# Patient Record
Sex: Female | Born: 1955 | Race: White | Hispanic: No | Marital: Married | Smoking: Former smoker
Health system: Southern US, Community
[De-identification: ages and names within clinical notes are randomized; demographics above are authoritative.]

## PROBLEM LIST (undated history)

## (undated) DIAGNOSIS — E039 Hypothyroidism, unspecified: Secondary | ICD-10-CM

## (undated) DIAGNOSIS — I8390 Asymptomatic varicose veins of unspecified lower extremity: Secondary | ICD-10-CM

## (undated) DIAGNOSIS — C50919 Malignant neoplasm of unspecified site of unspecified female breast: Secondary | ICD-10-CM

## (undated) DIAGNOSIS — M1990 Unspecified osteoarthritis, unspecified site: Secondary | ICD-10-CM

## (undated) DIAGNOSIS — D72819 Decreased white blood cell count, unspecified: Secondary | ICD-10-CM

## (undated) DIAGNOSIS — O24420 Gestational diabetes mellitus in childbirth, diet controlled: Secondary | ICD-10-CM

## (undated) DIAGNOSIS — Z9221 Personal history of antineoplastic chemotherapy: Secondary | ICD-10-CM

## (undated) DIAGNOSIS — D649 Anemia, unspecified: Secondary | ICD-10-CM

## (undated) DIAGNOSIS — M4120 Other idiopathic scoliosis, site unspecified: Secondary | ICD-10-CM

## (undated) DIAGNOSIS — Z923 Personal history of irradiation: Secondary | ICD-10-CM

## (undated) DIAGNOSIS — Z853 Personal history of malignant neoplasm of breast: Secondary | ICD-10-CM

## (undated) DIAGNOSIS — T8859XA Other complications of anesthesia, initial encounter: Secondary | ICD-10-CM

## (undated) HISTORY — PX: BREAST LUMPECTOMY: SHX2

## (undated) HISTORY — PX: TONSILLECTOMY: SHX5217

## (undated) HISTORY — DX: Gestational diabetes mellitus in childbirth, diet controlled: O24.420

## (undated) HISTORY — DX: Hypothyroidism, unspecified: E03.9

## (undated) HISTORY — PX: TOTAL HIP ARTHROPLASTY: SHX124

## (undated) HISTORY — DX: Asymptomatic varicose veins of unspecified lower extremity: I83.90

## (undated) HISTORY — DX: Anemia, unspecified: D64.9

## (undated) HISTORY — DX: Decreased white blood cell count, unspecified: D72.819

## (undated) HISTORY — DX: Personal history of malignant neoplasm of breast: Z85.3

## (undated) HISTORY — DX: Other idiopathic scoliosis, site unspecified: M41.20

## (undated) HISTORY — PX: BREAST BIOPSY: SHX20

## (undated) HISTORY — DX: Unspecified osteoarthritis, unspecified site: M19.90

---

## 1983-10-26 DIAGNOSIS — O24420 Gestational diabetes mellitus in childbirth, diet controlled: Secondary | ICD-10-CM

## 1983-10-26 HISTORY — DX: Gestational diabetes mellitus in childbirth, diet controlled: O24.420

## 1994-10-25 DIAGNOSIS — C50919 Malignant neoplasm of unspecified site of unspecified female breast: Secondary | ICD-10-CM

## 1994-10-25 HISTORY — DX: Malignant neoplasm of unspecified site of unspecified female breast: C50.919

## 1994-10-25 HISTORY — PX: OTHER SURGICAL HISTORY: SHX169

## 1997-12-30 ENCOUNTER — Ambulatory Visit (HOSPITAL_COMMUNITY): Admission: RE | Admit: 1997-12-30 | Discharge: 1997-12-30 | Payer: Self-pay | Admitting: Oncology

## 1998-01-03 ENCOUNTER — Ambulatory Visit (HOSPITAL_COMMUNITY): Admission: RE | Admit: 1998-01-03 | Discharge: 1998-01-03 | Payer: Self-pay | Admitting: Oncology

## 1998-04-14 ENCOUNTER — Other Ambulatory Visit: Admission: RE | Admit: 1998-04-14 | Discharge: 1998-04-14 | Payer: Self-pay | Admitting: *Deleted

## 1998-07-22 ENCOUNTER — Ambulatory Visit (HOSPITAL_COMMUNITY): Admission: RE | Admit: 1998-07-22 | Discharge: 1998-07-22 | Payer: Self-pay | Admitting: Oncology

## 1999-02-03 ENCOUNTER — Ambulatory Visit (HOSPITAL_COMMUNITY): Admission: RE | Admit: 1999-02-03 | Discharge: 1999-02-03 | Payer: Self-pay | Admitting: *Deleted

## 1999-02-03 ENCOUNTER — Encounter: Payer: Self-pay | Admitting: *Deleted

## 1999-04-15 ENCOUNTER — Other Ambulatory Visit: Admission: RE | Admit: 1999-04-15 | Discharge: 1999-04-15 | Payer: Self-pay | Admitting: *Deleted

## 1999-04-23 ENCOUNTER — Encounter: Payer: Self-pay | Admitting: Orthopedic Surgery

## 1999-04-23 ENCOUNTER — Ambulatory Visit (HOSPITAL_COMMUNITY): Admission: RE | Admit: 1999-04-23 | Discharge: 1999-04-23 | Payer: Self-pay | Admitting: Orthopedic Surgery

## 1999-05-11 ENCOUNTER — Ambulatory Visit (HOSPITAL_COMMUNITY): Admission: RE | Admit: 1999-05-11 | Discharge: 1999-05-11 | Payer: Self-pay | Admitting: *Deleted

## 1999-05-11 ENCOUNTER — Encounter (INDEPENDENT_AMBULATORY_CARE_PROVIDER_SITE_OTHER): Payer: Self-pay

## 2000-02-04 ENCOUNTER — Encounter: Admission: RE | Admit: 2000-02-04 | Discharge: 2000-02-04 | Payer: Self-pay | Admitting: Oncology

## 2000-02-04 ENCOUNTER — Encounter: Payer: Self-pay | Admitting: Oncology

## 2000-03-24 ENCOUNTER — Ambulatory Visit (HOSPITAL_COMMUNITY): Admission: RE | Admit: 2000-03-24 | Discharge: 2000-03-24 | Payer: Self-pay | Admitting: Orthopedic Surgery

## 2000-03-24 ENCOUNTER — Encounter: Payer: Self-pay | Admitting: Orthopedic Surgery

## 2000-03-29 ENCOUNTER — Encounter: Admission: RE | Admit: 2000-03-29 | Discharge: 2000-03-29 | Payer: Self-pay | Admitting: Oncology

## 2000-03-29 ENCOUNTER — Encounter: Payer: Self-pay | Admitting: Oncology

## 2000-04-12 ENCOUNTER — Other Ambulatory Visit: Admission: RE | Admit: 2000-04-12 | Discharge: 2000-04-12 | Payer: Self-pay | Admitting: *Deleted

## 2000-06-30 ENCOUNTER — Ambulatory Visit (HOSPITAL_COMMUNITY): Admission: RE | Admit: 2000-06-30 | Discharge: 2000-06-30 | Payer: Self-pay | Admitting: *Deleted

## 2000-06-30 ENCOUNTER — Encounter (INDEPENDENT_AMBULATORY_CARE_PROVIDER_SITE_OTHER): Payer: Self-pay

## 2000-09-08 ENCOUNTER — Encounter: Admission: RE | Admit: 2000-09-08 | Discharge: 2000-09-08 | Payer: Self-pay | Admitting: Oncology

## 2000-09-08 ENCOUNTER — Encounter: Payer: Self-pay | Admitting: Oncology

## 2001-02-15 ENCOUNTER — Encounter: Admission: RE | Admit: 2001-02-15 | Discharge: 2001-02-15 | Payer: Self-pay | Admitting: Oncology

## 2001-02-15 ENCOUNTER — Encounter: Payer: Self-pay | Admitting: Oncology

## 2001-04-24 ENCOUNTER — Other Ambulatory Visit: Admission: RE | Admit: 2001-04-24 | Discharge: 2001-04-24 | Payer: Self-pay | Admitting: *Deleted

## 2002-02-16 ENCOUNTER — Encounter: Payer: Self-pay | Admitting: Oncology

## 2002-02-16 ENCOUNTER — Encounter: Admission: RE | Admit: 2002-02-16 | Discharge: 2002-02-16 | Payer: Self-pay | Admitting: Oncology

## 2002-05-15 ENCOUNTER — Other Ambulatory Visit: Admission: RE | Admit: 2002-05-15 | Discharge: 2002-05-15 | Payer: Self-pay | Admitting: *Deleted

## 2002-07-30 ENCOUNTER — Encounter: Payer: Self-pay | Admitting: Oncology

## 2002-07-30 ENCOUNTER — Encounter: Admission: RE | Admit: 2002-07-30 | Discharge: 2002-07-30 | Payer: Self-pay | Admitting: Oncology

## 2003-02-18 ENCOUNTER — Encounter: Admission: RE | Admit: 2003-02-18 | Discharge: 2003-02-18 | Payer: Self-pay | Admitting: Oncology

## 2003-02-18 ENCOUNTER — Encounter: Payer: Self-pay | Admitting: Oncology

## 2003-06-18 ENCOUNTER — Other Ambulatory Visit: Admission: RE | Admit: 2003-06-18 | Discharge: 2003-06-18 | Payer: Self-pay | Admitting: *Deleted

## 2003-09-09 ENCOUNTER — Encounter: Admission: RE | Admit: 2003-09-09 | Discharge: 2003-09-09 | Payer: Self-pay | Admitting: *Deleted

## 2004-02-21 ENCOUNTER — Encounter: Admission: RE | Admit: 2004-02-21 | Discharge: 2004-02-21 | Payer: Self-pay | Admitting: *Deleted

## 2004-08-15 ENCOUNTER — Ambulatory Visit: Payer: Self-pay | Admitting: Oncology

## 2004-09-04 ENCOUNTER — Encounter: Payer: Self-pay | Admitting: Internal Medicine

## 2004-09-07 ENCOUNTER — Ambulatory Visit (HOSPITAL_COMMUNITY): Admission: RE | Admit: 2004-09-07 | Discharge: 2004-09-07 | Payer: Self-pay | Admitting: Oncology

## 2004-09-07 ENCOUNTER — Encounter: Admission: RE | Admit: 2004-09-07 | Discharge: 2004-09-07 | Payer: Self-pay | Admitting: Oncology

## 2005-01-06 ENCOUNTER — Encounter: Admission: RE | Admit: 2005-01-06 | Discharge: 2005-01-06 | Payer: Self-pay | Admitting: Family Medicine

## 2005-03-08 ENCOUNTER — Encounter: Admission: RE | Admit: 2005-03-08 | Discharge: 2005-03-08 | Payer: Self-pay | Admitting: *Deleted

## 2005-08-20 ENCOUNTER — Ambulatory Visit: Payer: Self-pay | Admitting: Oncology

## 2005-10-15 ENCOUNTER — Encounter: Admission: RE | Admit: 2005-10-15 | Discharge: 2005-10-15 | Payer: Self-pay | Admitting: Oncology

## 2006-02-23 ENCOUNTER — Encounter: Payer: Self-pay | Admitting: Oncology

## 2006-03-11 ENCOUNTER — Encounter: Admission: RE | Admit: 2006-03-11 | Discharge: 2006-03-11 | Payer: Self-pay | Admitting: Oncology

## 2006-08-12 ENCOUNTER — Ambulatory Visit: Payer: Self-pay | Admitting: Oncology

## 2006-08-16 LAB — CBC WITH DIFFERENTIAL/PLATELET
BASO%: 0.4 % (ref 0.0–2.0)
Basophils Absolute: 0 10*3/uL (ref 0.0–0.1)
EOS%: 1.2 % (ref 0.0–7.0)
Eosinophils Absolute: 0 10*3/uL (ref 0.0–0.5)
HCT: 39.2 % (ref 34.8–46.6)
HGB: 13.5 g/dL (ref 11.6–15.9)
LYMPH%: 31.4 % (ref 14.0–48.0)
MCH: 32.6 pg (ref 26.0–34.0)
MCHC: 34.6 g/dL (ref 32.0–36.0)
MCV: 94.4 fL (ref 81.0–101.0)
MONO#: 0.3 10*3/uL (ref 0.1–0.9)
MONO%: 6.7 % (ref 0.0–13.0)
NEUT#: 2.3 10*3/uL (ref 1.5–6.5)
NEUT%: 60.3 % (ref 39.6–76.8)
Platelets: 221 10*3/uL (ref 145–400)
RBC: 4.15 10*6/uL (ref 3.70–5.32)
RDW: 12.2 % (ref 11.3–14.5)
WBC: 3.8 10*3/uL — ABNORMAL LOW (ref 3.9–10.0)
lymph#: 1.2 10*3/uL (ref 0.9–3.3)

## 2006-09-09 ENCOUNTER — Encounter: Admission: RE | Admit: 2006-09-09 | Discharge: 2006-09-09 | Payer: Self-pay | Admitting: Oncology

## 2006-09-30 ENCOUNTER — Ambulatory Visit: Payer: Self-pay | Admitting: Oncology

## 2006-10-28 LAB — MORPHOLOGY: PLT EST: ADEQUATE

## 2006-10-28 LAB — CBC WITH DIFFERENTIAL/PLATELET
BASO%: 0.4 % (ref 0.0–2.0)
Basophils Absolute: 0 10*3/uL (ref 0.0–0.1)
EOS%: 1.9 % (ref 0.0–7.0)
Eosinophils Absolute: 0.1 10*3/uL (ref 0.0–0.5)
HCT: 36.1 % (ref 34.8–46.6)
HGB: 12.3 g/dL (ref 11.6–15.9)
LYMPH%: 32.5 % (ref 14.0–48.0)
MCH: 31.8 pg (ref 26.0–34.0)
MCHC: 34 g/dL (ref 32.0–36.0)
MCV: 93.4 fL (ref 81.0–101.0)
MONO#: 0.3 10*3/uL (ref 0.1–0.9)
MONO%: 8.2 % (ref 0.0–13.0)
NEUT#: 2 10*3/uL (ref 1.5–6.5)
NEUT%: 57 % (ref 39.6–76.8)
Platelets: 215 10*3/uL (ref 145–400)
RBC: 3.87 10*6/uL (ref 3.70–5.32)
RDW: 12.3 % (ref 11.3–14.5)
WBC: 3.6 10*3/uL — ABNORMAL LOW (ref 3.9–10.0)
lymph#: 1.2 10*3/uL (ref 0.9–3.3)

## 2006-10-28 LAB — CHCC SMEAR

## 2007-03-01 ENCOUNTER — Ambulatory Visit: Payer: Self-pay | Admitting: Oncology

## 2007-03-06 LAB — CBC WITH DIFFERENTIAL/PLATELET
BASO%: 0.4 % (ref 0.0–2.0)
Basophils Absolute: 0 10*3/uL (ref 0.0–0.1)
EOS%: 1.4 % (ref 0.0–7.0)
Eosinophils Absolute: 0 10*3/uL (ref 0.0–0.5)
HCT: 35.8 % (ref 34.8–46.6)
HGB: 12.7 g/dL (ref 11.6–15.9)
LYMPH%: 31.6 % (ref 14.0–48.0)
MCH: 33 pg (ref 26.0–34.0)
MCHC: 35.5 g/dL (ref 32.0–36.0)
MCV: 92.8 fL (ref 81.0–101.0)
MONO#: 0.2 10*3/uL (ref 0.1–0.9)
MONO%: 6.6 % (ref 0.0–13.0)
NEUT#: 2.2 10*3/uL (ref 1.5–6.5)
NEUT%: 60 % (ref 39.6–76.8)
Platelets: 193 10*3/uL (ref 145–400)
RBC: 3.86 10*6/uL (ref 3.70–5.32)
RDW: 13 % (ref 11.3–14.5)
WBC: 3.6 10*3/uL — ABNORMAL LOW (ref 3.9–10.0)
lymph#: 1.1 10*3/uL (ref 0.9–3.3)

## 2007-03-06 LAB — MORPHOLOGY
PLT EST: ADEQUATE
RBC Comments: NORMAL

## 2007-08-10 ENCOUNTER — Ambulatory Visit: Payer: Self-pay | Admitting: Oncology

## 2007-08-15 LAB — CBC WITH DIFFERENTIAL/PLATELET
BASO%: 0.3 % (ref 0.0–2.0)
Basophils Absolute: 0 10*3/uL (ref 0.0–0.1)
EOS%: 1 % (ref 0.0–7.0)
Eosinophils Absolute: 0 10*3/uL (ref 0.0–0.5)
HCT: 34.7 % — ABNORMAL LOW (ref 34.8–46.6)
HGB: 12.2 g/dL (ref 11.6–15.9)
LYMPH%: 25.6 % (ref 14.0–48.0)
MCH: 32.4 pg (ref 26.0–34.0)
MCHC: 35.2 g/dL (ref 32.0–36.0)
MCV: 92.2 fL (ref 81.0–101.0)
MONO#: 0.3 10*3/uL (ref 0.1–0.9)
MONO%: 6.4 % (ref 0.0–13.0)
NEUT#: 2.8 10*3/uL (ref 1.5–6.5)
NEUT%: 66.7 % (ref 39.6–76.8)
Platelets: 208 10*3/uL (ref 145–400)
RBC: 3.76 10*6/uL (ref 3.70–5.32)
RDW: 12.2 % (ref 11.3–14.5)
WBC: 4.2 10*3/uL (ref 3.9–10.0)
lymph#: 1.1 10*3/uL (ref 0.9–3.3)

## 2007-08-15 LAB — COMPREHENSIVE METABOLIC PANEL
ALT: 17 U/L (ref 0–35)
AST: 14 U/L (ref 0–37)
Albumin: 4.3 g/dL (ref 3.5–5.2)
Alkaline Phosphatase: 63 U/L (ref 39–117)
BUN: 16 mg/dL (ref 6–23)
CO2: 28 mEq/L (ref 19–32)
Calcium: 9.3 mg/dL (ref 8.4–10.5)
Chloride: 103 mEq/L (ref 96–112)
Creatinine, Ser: 1 mg/dL (ref 0.40–1.20)
Glucose, Bld: 101 mg/dL — ABNORMAL HIGH (ref 70–99)
Potassium: 3.7 mEq/L (ref 3.5–5.3)
Sodium: 139 mEq/L (ref 135–145)
Total Bilirubin: 0.3 mg/dL (ref 0.3–1.2)
Total Protein: 6.9 g/dL (ref 6.0–8.3)

## 2007-08-15 LAB — LACTATE DEHYDROGENASE: LDH: 161 U/L (ref 94–250)

## 2007-08-15 LAB — MORPHOLOGY
PLT EST: ADEQUATE
RBC Comments: NORMAL

## 2007-09-11 ENCOUNTER — Encounter: Admission: RE | Admit: 2007-09-11 | Discharge: 2007-09-11 | Payer: Self-pay | Admitting: Oncology

## 2008-06-10 ENCOUNTER — Encounter: Payer: Self-pay | Admitting: Internal Medicine

## 2008-06-11 ENCOUNTER — Encounter: Payer: Self-pay | Admitting: Internal Medicine

## 2008-09-05 ENCOUNTER — Ambulatory Visit: Payer: Self-pay | Admitting: Oncology

## 2008-09-09 ENCOUNTER — Encounter: Payer: Self-pay | Admitting: Internal Medicine

## 2008-09-09 LAB — COMPREHENSIVE METABOLIC PANEL
ALT: 16 U/L (ref 0–35)
AST: 15 U/L (ref 0–37)
Albumin: 4.3 g/dL (ref 3.5–5.2)
Alkaline Phosphatase: 58 U/L (ref 39–117)
BUN: 18 mg/dL (ref 6–23)
CO2: 27 mEq/L (ref 19–32)
Calcium: 9.1 mg/dL (ref 8.4–10.5)
Chloride: 101 mEq/L (ref 96–112)
Creatinine, Ser: 0.92 mg/dL (ref 0.40–1.20)
Glucose, Bld: 93 mg/dL (ref 70–99)
Potassium: 3.9 mEq/L (ref 3.5–5.3)
Sodium: 140 mEq/L (ref 135–145)
Total Bilirubin: 0.4 mg/dL (ref 0.3–1.2)
Total Protein: 7 g/dL (ref 6.0–8.3)

## 2008-09-09 LAB — CBC WITH DIFFERENTIAL/PLATELET
BASO%: 0.4 % (ref 0.0–2.0)
Basophils Absolute: 0 10*3/uL (ref 0.0–0.1)
EOS%: 1.4 % (ref 0.0–7.0)
Eosinophils Absolute: 0.1 10*3/uL (ref 0.0–0.5)
HCT: 36.9 % (ref 34.8–46.6)
HGB: 12.7 g/dL (ref 11.6–15.9)
LYMPH%: 29.1 % (ref 14.0–48.0)
MCH: 32.4 pg (ref 26.0–34.0)
MCHC: 34.5 g/dL (ref 32.0–36.0)
MCV: 93.9 fL (ref 81.0–101.0)
MONO#: 0.2 10*3/uL (ref 0.1–0.9)
MONO%: 6.7 % (ref 0.0–13.0)
NEUT#: 2.2 10*3/uL (ref 1.5–6.5)
NEUT%: 62.4 % (ref 39.6–76.8)
Platelets: 195 10*3/uL (ref 145–400)
RBC: 3.93 10*6/uL (ref 3.70–5.32)
RDW: 12.1 % (ref 11.3–14.5)
WBC: 3.5 10*3/uL — ABNORMAL LOW (ref 3.9–10.0)
lymph#: 1 10*3/uL (ref 0.9–3.3)

## 2008-09-09 LAB — LACTATE DEHYDROGENASE: LDH: 148 U/L (ref 94–250)

## 2008-09-18 ENCOUNTER — Encounter: Admission: RE | Admit: 2008-09-18 | Discharge: 2008-09-18 | Payer: Self-pay | Admitting: Oncology

## 2008-09-23 ENCOUNTER — Encounter: Admission: RE | Admit: 2008-09-23 | Discharge: 2008-09-23 | Payer: Self-pay | Admitting: Oncology

## 2008-09-24 ENCOUNTER — Encounter: Payer: Self-pay | Admitting: Internal Medicine

## 2008-10-03 LAB — CONVERTED CEMR LAB: Pap Smear: NORMAL

## 2009-03-17 ENCOUNTER — Ambulatory Visit: Payer: Self-pay | Admitting: Internal Medicine

## 2009-03-17 DIAGNOSIS — D649 Anemia, unspecified: Secondary | ICD-10-CM | POA: Insufficient documentation

## 2009-03-17 DIAGNOSIS — Z853 Personal history of malignant neoplasm of breast: Secondary | ICD-10-CM | POA: Insufficient documentation

## 2009-03-17 DIAGNOSIS — M1990 Unspecified osteoarthritis, unspecified site: Secondary | ICD-10-CM | POA: Insufficient documentation

## 2009-03-17 DIAGNOSIS — Z9189 Other specified personal risk factors, not elsewhere classified: Secondary | ICD-10-CM | POA: Insufficient documentation

## 2009-03-17 DIAGNOSIS — M129 Arthropathy, unspecified: Secondary | ICD-10-CM | POA: Insufficient documentation

## 2009-03-17 DIAGNOSIS — Z87898 Personal history of other specified conditions: Secondary | ICD-10-CM | POA: Insufficient documentation

## 2009-03-17 DIAGNOSIS — I8390 Asymptomatic varicose veins of unspecified lower extremity: Secondary | ICD-10-CM | POA: Insufficient documentation

## 2009-03-17 DIAGNOSIS — M4120 Other idiopathic scoliosis, site unspecified: Secondary | ICD-10-CM | POA: Insufficient documentation

## 2009-03-26 ENCOUNTER — Telehealth (INDEPENDENT_AMBULATORY_CARE_PROVIDER_SITE_OTHER): Payer: Self-pay | Admitting: *Deleted

## 2009-04-02 ENCOUNTER — Encounter: Payer: Self-pay | Admitting: Internal Medicine

## 2009-04-08 ENCOUNTER — Telehealth (INDEPENDENT_AMBULATORY_CARE_PROVIDER_SITE_OTHER): Payer: Self-pay | Admitting: Radiology

## 2009-04-22 ENCOUNTER — Telehealth (INDEPENDENT_AMBULATORY_CARE_PROVIDER_SITE_OTHER): Payer: Self-pay

## 2009-04-23 ENCOUNTER — Encounter: Payer: Self-pay | Admitting: Cardiology

## 2009-04-23 ENCOUNTER — Ambulatory Visit: Payer: Self-pay

## 2009-04-24 ENCOUNTER — Encounter (INDEPENDENT_AMBULATORY_CARE_PROVIDER_SITE_OTHER): Payer: Self-pay | Admitting: *Deleted

## 2009-08-25 LAB — HM MAMMOGRAPHY

## 2009-09-19 ENCOUNTER — Encounter: Admission: RE | Admit: 2009-09-19 | Discharge: 2009-09-19 | Payer: Self-pay | Admitting: Oncology

## 2009-10-03 ENCOUNTER — Ambulatory Visit: Payer: Self-pay | Admitting: Oncology

## 2009-10-07 ENCOUNTER — Encounter: Payer: Self-pay | Admitting: Internal Medicine

## 2009-10-07 LAB — COMPREHENSIVE METABOLIC PANEL
ALT: 24 U/L (ref 0–35)
AST: 18 U/L (ref 0–37)
Albumin: 4.4 g/dL (ref 3.5–5.2)
Alkaline Phosphatase: 53 U/L (ref 39–117)
BUN: 16 mg/dL (ref 6–23)
CO2: 26 mEq/L (ref 19–32)
Calcium: 9.8 mg/dL (ref 8.4–10.5)
Chloride: 102 mEq/L (ref 96–112)
Creatinine, Ser: 0.88 mg/dL (ref 0.40–1.20)
Glucose, Bld: 102 mg/dL — ABNORMAL HIGH (ref 70–99)
Potassium: 4.2 mEq/L (ref 3.5–5.3)
Sodium: 140 mEq/L (ref 135–145)
Total Bilirubin: 0.4 mg/dL (ref 0.3–1.2)
Total Protein: 7.2 g/dL (ref 6.0–8.3)

## 2009-10-07 LAB — CBC WITH DIFFERENTIAL/PLATELET
BASO%: 0.4 % (ref 0.0–2.0)
Basophils Absolute: 0 10*3/uL (ref 0.0–0.1)
EOS%: 1.1 % (ref 0.0–7.0)
Eosinophils Absolute: 0 10*3/uL (ref 0.0–0.5)
HCT: 38.4 % (ref 34.8–46.6)
HGB: 13.3 g/dL (ref 11.6–15.9)
LYMPH%: 29.4 % (ref 14.0–49.7)
MCH: 33 pg (ref 25.1–34.0)
MCHC: 34.5 g/dL (ref 31.5–36.0)
MCV: 95.5 fL (ref 79.5–101.0)
MONO#: 0.2 10*3/uL (ref 0.1–0.9)
MONO%: 7.5 % (ref 0.0–14.0)
NEUT#: 1.9 10*3/uL (ref 1.5–6.5)
NEUT%: 61.6 % (ref 38.4–76.8)
Platelets: 203 10*3/uL (ref 145–400)
RBC: 4.02 10*6/uL (ref 3.70–5.45)
RDW: 12.4 % (ref 11.2–14.5)
WBC: 3.1 10*3/uL — ABNORMAL LOW (ref 3.9–10.3)
lymph#: 0.9 10*3/uL (ref 0.9–3.3)

## 2009-10-10 ENCOUNTER — Encounter: Payer: Self-pay | Admitting: Internal Medicine

## 2009-11-03 ENCOUNTER — Encounter: Payer: Self-pay | Admitting: Internal Medicine

## 2009-11-03 LAB — HM COLONOSCOPY

## 2010-05-01 ENCOUNTER — Ambulatory Visit: Payer: Self-pay | Admitting: Internal Medicine

## 2010-05-01 LAB — CONVERTED CEMR LAB
ALT: 20 units/L (ref 0–35)
AST: 18 units/L (ref 0–37)
Albumin: 3.9 g/dL (ref 3.5–5.2)
Alkaline Phosphatase: 50 units/L (ref 39–117)
BUN: 12 mg/dL (ref 6–23)
Basophils Absolute: 0 10*3/uL (ref 0.0–0.1)
Basophils Relative: 0.5 % (ref 0.0–3.0)
Bilirubin Urine: NEGATIVE
Bilirubin, Direct: 0.1 mg/dL (ref 0.0–0.3)
CO2: 29 meq/L (ref 19–32)
Calcium: 9.2 mg/dL (ref 8.4–10.5)
Chloride: 111 meq/L (ref 96–112)
Cholesterol: 189 mg/dL (ref 0–200)
Creatinine, Ser: 0.9 mg/dL (ref 0.4–1.2)
Eosinophils Absolute: 0 10*3/uL (ref 0.0–0.7)
Eosinophils Relative: 1.2 % (ref 0.0–5.0)
GFR calc non Af Amer: 71.57 mL/min (ref >60)
Glucose, Bld: 85 mg/dL (ref 70–99)
HCT: 39.8 % (ref 36.0–46.0)
HDL: 68.9 mg/dL (ref >39.00)
Hemoglobin, Urine: NEGATIVE
Hemoglobin: 13.8 g/dL (ref 12.0–15.0)
Ketones, ur: NEGATIVE mg/dL
LDL Cholesterol: 108 mg/dL — ABNORMAL HIGH (ref 0–99)
Lymphocytes Relative: 36.6 % (ref 12.0–46.0)
Lymphs Abs: 1 10*3/uL (ref 0.7–4.0)
MCHC: 34.6 g/dL (ref 30.0–36.0)
MCV: 95.1 fL (ref 78.0–100.0)
Monocytes Absolute: 0.2 10*3/uL (ref 0.1–1.0)
Monocytes Relative: 8.6 % (ref 3.0–12.0)
Neutro Abs: 1.5 10*3/uL (ref 1.4–7.7)
Neutrophils Relative %: 53.1 % (ref 43.0–77.0)
Nitrite: NEGATIVE
Platelets: 172 10*3/uL (ref 150.0–400.0)
Potassium: 5.2 meq/L — ABNORMAL HIGH (ref 3.5–5.1)
RBC: 4.18 M/uL (ref 3.87–5.11)
RDW: 12.6 % (ref 11.5–14.6)
Sodium: 144 meq/L (ref 135–145)
Specific Gravity, Urine: 1.015 (ref 1.000–1.030)
TSH: 3.45 microintl units/mL (ref 0.35–5.50)
Total Bilirubin: 0.6 mg/dL (ref 0.3–1.2)
Total CHOL/HDL Ratio: 3
Total Protein, Urine: NEGATIVE mg/dL
Total Protein: 6.7 g/dL (ref 6.0–8.3)
Triglycerides: 61 mg/dL (ref 0.0–149.0)
Urine Glucose: NEGATIVE mg/dL
Urobilinogen, UA: 0.2 (ref 0.0–1.0)
VLDL: 12.2 mg/dL (ref 0.0–40.0)
WBC: 2.8 10*3/uL — ABNORMAL LOW (ref 4.5–10.5)
pH: 7 (ref 5.0–8.0)

## 2010-05-04 ENCOUNTER — Ambulatory Visit: Payer: Self-pay | Admitting: Internal Medicine

## 2010-05-04 DIAGNOSIS — M542 Cervicalgia: Secondary | ICD-10-CM | POA: Insufficient documentation

## 2010-06-09 ENCOUNTER — Encounter: Payer: Self-pay | Admitting: Internal Medicine

## 2010-09-21 ENCOUNTER — Encounter: Admission: RE | Admit: 2010-09-21 | Discharge: 2010-09-21 | Payer: Self-pay | Admitting: Oncology

## 2010-09-23 ENCOUNTER — Ambulatory Visit (HOSPITAL_COMMUNITY)
Admission: RE | Admit: 2010-09-23 | Discharge: 2010-09-23 | Payer: Self-pay | Source: Home / Self Care | Admitting: Oncology

## 2010-09-30 ENCOUNTER — Ambulatory Visit: Payer: Self-pay | Admitting: Oncology

## 2010-10-02 ENCOUNTER — Encounter: Payer: Self-pay | Admitting: Internal Medicine

## 2010-10-02 LAB — CONVERTED CEMR LAB
ALT: 20 units/L
AST: 22 units/L
Albumin: 4.3 g/dL
Alkaline Phosphatase: 58 units/L
BUN: 15 mg/dL
Basophils Relative: 0.3 %
CO2: 22 meq/L
Calcium: 9.3 mg/dL
Chloride: 100 meq/L
Creatinine, Ser: 0.93 mg/dL
Eosinophils Relative: 0.5 %
Glucose, Bld: 95 mg/dL
HCT: 36 %
Hemoglobin: 12.6 g/dL
Lymphocytes, automated: 23 %
MCV: 94.7 fL
Monocytes Relative: 5.5 %
Neutrophils Relative %: 68.7 %
Platelets: 204 10*3/uL
Potassium: 3.6 meq/L
RBC: 3.8 M/uL
RDW: 12.3 %
Sodium: 137 meq/L
Total Bilirubin: 0.4 mg/dL
Total Protein: 6.6 g/dL
WBC: 4.6 10*3/uL

## 2010-10-02 LAB — CBC WITH DIFFERENTIAL/PLATELET
BASO%: 0.3 % (ref 0.0–2.0)
Basophils Absolute: 0 10*3/uL (ref 0.0–0.1)
EOS%: 0.5 % (ref 0.0–7.0)
Eosinophils Absolute: 0 10*3/uL (ref 0.0–0.5)
HCT: 36 % (ref 34.8–46.6)
HGB: 12.6 g/dL (ref 11.6–15.9)
LYMPH%: 25 % (ref 14.0–49.7)
MCH: 33.1 pg (ref 25.1–34.0)
MCHC: 35 g/dL (ref 31.5–36.0)
MCV: 94.7 fL (ref 79.5–101.0)
MONO#: 0.3 10*3/uL (ref 0.1–0.9)
MONO%: 5.5 % (ref 0.0–14.0)
NEUT#: 3.2 10*3/uL (ref 1.5–6.5)
NEUT%: 68.7 % (ref 38.4–76.8)
Platelets: 204 10*3/uL (ref 145–400)
RBC: 3.8 10*6/uL (ref 3.70–5.45)
RDW: 12.3 % (ref 11.2–14.5)
WBC: 4.6 10*3/uL (ref 3.9–10.3)
lymph#: 1.2 10*3/uL (ref 0.9–3.3)

## 2010-10-03 LAB — COMPREHENSIVE METABOLIC PANEL
ALT: 20 U/L (ref 0–35)
AST: 22 U/L (ref 0–37)
Albumin: 4.3 g/dL (ref 3.5–5.2)
Alkaline Phosphatase: 58 U/L (ref 39–117)
BUN: 15 mg/dL (ref 6–23)
CO2: 22 mEq/L (ref 19–32)
Calcium: 9.3 mg/dL (ref 8.4–10.5)
Chloride: 100 mEq/L (ref 96–112)
Creatinine, Ser: 0.93 mg/dL (ref 0.40–1.20)
Glucose, Bld: 95 mg/dL (ref 70–99)
Potassium: 3.6 mEq/L (ref 3.5–5.3)
Sodium: 137 mEq/L (ref 135–145)
Total Bilirubin: 0.4 mg/dL (ref 0.3–1.2)
Total Protein: 6.6 g/dL (ref 6.0–8.3)

## 2010-10-03 LAB — VITAMIN D 25 HYDROXY (VIT D DEFICIENCY, FRACTURES): Vit D, 25-Hydroxy: 39 ng/mL (ref 30–89)

## 2010-10-03 LAB — LACTATE DEHYDROGENASE: LDH: 153 U/L (ref 94–250)

## 2010-10-09 ENCOUNTER — Encounter: Payer: Self-pay | Admitting: Internal Medicine

## 2010-10-12 ENCOUNTER — Encounter: Payer: Self-pay | Admitting: Internal Medicine

## 2010-11-15 ENCOUNTER — Encounter: Payer: Self-pay | Admitting: Oncology

## 2010-11-22 LAB — CONVERTED CEMR LAB
ALT: 24 units/L
AST: 18 units/L
Albumin: 4.4 g/dL
Alkaline Phosphatase: 53 units/L
BUN: 16 mg/dL
Basophils Relative: 0 %
CO2: 26 meq/L
Calcium: 9.8 mg/dL
Chloride: 102 meq/L
Creatinine, Ser: 0.88 mg/dL
Eosinophils Relative: 0 %
Glucose, Bld: 102 mg/dL
HCT: 38.4 %
Hemoglobin: 13.3 g/dL
Lymphocytes, automated: 0.9 %
MCV: 95.5 fL
Monocytes Relative: 0.2 %
Neutrophils Relative %: 61.6 %
Platelets: 203 10*3/uL
Potassium: 4.2 meq/L
RBC: 4.02 M/uL
RDW: 12.4 %
Sodium: 140 meq/L
Total Bilirubin: 0.4 mg/dL
WBC: 3.1 10*3/uL

## 2010-11-24 NOTE — Miscellaneous (Signed)
Summary: PT Discharge/[facility]  PT Discharge/[facility]   Imported By: Sherian Rein 06/12/2010 14:57:44  _____________________________________________________________________  External Attachment:    Type:   Image     Comment:   External Document

## 2010-11-24 NOTE — Progress Notes (Signed)
Summary: Nuclear Pre-Procedure  Phone Note Outgoing Call Call back at [facility] Phone [phone]   Call placed by: Stanton Kidney, EMT-P,  March 26, 2009 1:26 PM Summary of Call: Left message with information on Myoview Information Sheet (see scanned document for details).        Nuclear Med Background Indications for Stress Test: Evaluation for Ischemia   History: History of Chemo      Nuclear Pre-Procedure Cardiac Risk Factors: Family History - CAD, History of Smoking Height (in): 68  Appended Document: Nuclear Pre-Procedure Patient no show for myoview today; left message on patient's recorder to call us back; notified Dr.V. Diamantina Monks nurse of no show.  Appended Document: Nuclear Pre-Procedure Patient called back today unaware of previous myoview appointment. Rest/Stress myoview rescheduled for 04/09/09 8:45.

## 2010-11-24 NOTE — Progress Notes (Signed)
Summary: Nuc. Pre-Procedure  Phone Note Outgoing Call Call back at [facility] Phone [phone]   Call placed by: Irean Hong, RN,  April 22, 2009 2:50 PM Summary of Call: Reviewed information on Myoview Information Sheet (see scanned document for further details).  Spoke with patient.       Nuclear Med Background Indications for Stress Test: Evaluation for Ischemia   History: History of Chemo      Nuclear Pre-Procedure Cardiac Risk Factors: Family History - CAD, History of Smoking Height (in): 68

## 2010-11-24 NOTE — Procedures (Signed)
Summary: [facility]  Colonoscopy/[facility]   Imported By: Sherian Rein 11/24/2009 07:41:26  _____________________________________________________________________  External Attachment:    Type:   Image     Comment:   External Document

## 2010-11-24 NOTE — Assessment & Plan Note (Signed)
Summary: CPX/ NWS #   Vital Signs:  Patient profile:   55 year old female Height:      68 inches (172.72 cm) Weight:      159.12 pounds (72.33 kg) BMI:     24.28 O2 Sat:      98 % on Room air Temp:     98.8 degrees F (37.11 degrees C) oral Pulse rate:   65 / minute BP sitting:   120 / 82  (right arm) Cuff size:   regular  Vitals Entered By: Orlan Leavens (May 04, 2010 2:15 PM)  O2 Flow:  Room air CC: CPX Is Patient Diabetic? No Pain Assessment Patient in pain? no        Primary Care Provider:  Newt Lukes MD  CC:  CPX.  History of Present Illness: patient is here today for annual physical. Patient feels well -  notes right sided neck pain onset 09/2009 - course is intermittent - worst while sleeping on right side & turning onto back no radiation down arm - no weakness or tingling - no injury recalled - mild symptoms but persistent and repetative   also reviewd prior med hx: history of breast cancer. Has been cancer free since 1996 following lumpectomy, chemotherapy and radiation. She follows with Dr. Cyndie Chime. She has regular lab work done in his office, which she reports he irecommended be done with primary care physician. she is uncertain of what this lab work involves.  history of anemia. Patient believes her last lab work has shown no anemia, but she has had low blood count since her diagnosis of cancer. Likewise, she has also had history of low white count. She is not subject to infections and considers herself overall healthy.  history of scoliosis. She has no back complaints. 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  she has no chest pains. However, given the family history of early coronary disease in her father and grandfather. She has never undergone a cardiac stress test and would like to do so at this time. No other personal history of dyslipidemia or hypertension. Remote smoking -quit in her  60s  lastly she is concerned regarding varicose veins. These are greater in the left leg than the right leg. She reports her mother believes these are a risk to her health. She has had no change in the varicosities. No swelling or warmth in her legs. No shortness of breath. No history of other clots   Preventive Screening-Counseling & Management  Alcohol-Tobacco     Alcohol drinks/day: 0     Alcohol Counseling: not indicated; use of alcohol is not excessive or problematic     Smoking Status: quit     Tobacco Counseling: to remain off tobacco products  Caffeine-Diet-Exercise     Caffeine Counseling: not indicated; caffeine use is not excessive or problematic     Diet Counseling: not indicated; diet is assessed to be healthy     Does Patient Exercise: yes     Exercise Counseling: not indicated; exercise is adequate     Depression Counseling: not indicated; screening negative for depression  Safety-Violence-Falls     Seat Belt Use: yes     Seat Belt Counseling: not indicated; patient wears seat belts     Helmet Counseling: not indicated; patient wears helmet when riding bicycle/motocycle     Firearm Counseling: not applicable     Violence in the Home: no risk noted  Fall Risk Counseling: not indicated; no significant falls noted  Clinical Review Panels:  Prevention   Last Mammogram:  No specific mammographic evidence of malignancy.   (08/25/2009)   Last Pap Smear:  normal (10/03/2008)   Last Colonoscopy:  Done @ dr. Kinnie Scales Impression: No colorectal neoplasia-normal colonoscopy. repeat in five years because of oersonal hx of breast cx (11/03/2009)  Immunizations   Last Tetanus Booster:  Td (04/24/2004)  Lipid Management   Cholesterol:  189 (05/01/2010)   LDL (bad choesterol):  108 (05/01/2010)   HDL (good cholesterol):  68.90 (05/01/2010)  CBC   WBC:  2.8 (05/01/2010)   RBC:  4.18 (05/01/2010)   Hgb:  13.8 (05/01/2010)   Hct:  39.8 (05/01/2010)   Platelets:  172.0  (05/01/2010)   MCV  95.1 (05/01/2010)   MCHC  34.6 (05/01/2010)   RDW  12.6 (05/01/2010)   PMN:  53.1 (05/01/2010)   Lymphs:  36.6 (05/01/2010)   Monos:  8.6 (05/01/2010)   Eosinophils:  1.2 (05/01/2010)   Basophil:  0.5 (05/01/2010)  Complete Metabolic Panel   Glucose:  85 (05/01/2010)   Sodium:  144 (05/01/2010)   Potassium:  5.2 (05/01/2010)   Chloride:  111 (05/01/2010)   CO2:  29 (05/01/2010)   BUN:  12 (05/01/2010)   Creatinine:  0.9 (05/01/2010)   Albumin:  3.9 (05/01/2010)   Total Protein:  6.7 (05/01/2010)   Calcium:  9.2 (05/01/2010)   Total Bili:  0.6 (05/01/2010)   Alk Phos:  50 (05/01/2010)   SGPT (ALT):  20 (05/01/2010)   SGOT (AST):  18 (05/01/2010)   Current Medications (verified): 1)  Multivitamins  Caps (Multiple Vitamin) 2)  Calcium 600/vitamin D 600-400 Mg-Unit Tabs (Calcium Carbonate-Vitamin D) .Marland Kitchen.. 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, mild since chemo - Osteoarthritis - hips (hx)  MD roster - onc - [facility]  Past Surgical History: Tonsillectomy (1963)  Breast biopsy (1996) Hip replacement (2002 & 2003) - [place] [facility]  Treatment for breat cancer (1996)  Family History: Family History of Arthritis (parent) Family History Breast cancer 1st degree relative <50 (parent & other blood relative)  Family History Diabetes 1st degree relative (grandparent) Family History High cholesterol (parent) Heart disease (parent &  grandparent) Parkinson intestine cancer (parent 7 grandparent)  Social History: Former Smoker  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 I have reviewed all other systems and they were negative.    Physical Exam  General:  alert, well-developed, well-nourished, and cooperative to examination.    Head:  Normocephalic and atraumatic without obvious abnormalities. No apparent alopecia or balding. Eyes:  vision grossly intact; pupils equal, round and reactive to light.  conjunctiva and lids normal.    Ears:  normal pinnae bilaterally, without erythema, swelling, or tenderness to palpation. TMs clear, without effusion, or cerumen impaction. Hearing grossly normal bilaterally  Mouth:  teeth and gums in good repair; mucous membranes moist, without lesions or ulcers. oropharynx clear without exudate, no erythema.  Neck:  supple, full ROM, no masses, no thyromegaly; no thyroid nodules or tenderness. no JVD or carotid bruits.   Lungs:  normal respiratory effort, no intercostal retractions or  use of accessory muscles; normal breath sounds bilaterally - no crackles and no wheezes.    Heart:  normal rate, regular rhythm, no murmur, and no rub. BLE without edema. normal DP pulses and normal cap refill in all 4 extremities    Abdomen:  soft, non-tender, normal bowel sounds, no distention; no masses and no appreciable hepatomegaly or splenomegaly.   Genitalia:  defer to gyn Msk:  mild kyphosis noted of thoracic spine.  cervical vertebra nontender to palp, no paraspinal spasm or tenderness - FROM c-spine without pain or reproducible symptoms - gait and balance normal Neurologic:  alert & oriented X3 and cranial nerves II-XII symetrically intact.  strength normal in all extremities, sensation intact to light touch, and gait normal. speech fluent without dysarthria or aphasia; follows commands with good comprehension.  Skin:  no rashes, vesicles, ulcers, or erythema. No nodules or irregularity to palpation.  Psych:  Oriented X3, memory intact for recent and remote, normally interactive, good eye contact, not anxious appearing, not depressed appearing, and not agitated.      Impression &  Recommendations:  Problem # 1:  PREVENTIVE HEALTH CARE (ICD-V70.0) Patient has been counseled on age-appropriate routine health concerns for screening and prevention. These are reviewed and up-to-date. Immunizations are up-to-date or declined. Labs and ECG reviewed.  Orders: EKG w/ Interpretation (93000)  Problem # 2:  CERVICALGIA (ICD-723.1)  check xray now - ?DDD also refer to  for eval and tx Orders: T-Cervicle Spine 2-3 Views [phone]) Physical Therapy Referral (PT)  Discussed exercises and use of moist heat or cold and medication.   Complete Medication List: 1)  Multivitamins Caps (Multiple vitamin) 2)  Calcium 600/vitamin D 600-400 Mg-unit Tabs (Calcium carbonate-vitamin d) .Marland Kitchen.. 1 by mouth qid  Patient Instructions: 1)  it was good to see you today. 2)  labs, exam and EKG look good - you are up to date on your preventitive health services 3)  xray neck ordered today - your results will be called to you 4)  we'll make referral for physical therapy for your neck. Our office will contact you regarding this appointment once made.  If any equipment is needed to help with your neck pain managment, PT will let us know and we will arrange 5)  Please schedule a follow-up appointment annually for your medical physical and labs, sooner if problems.    Mammogram  Procedure date:  08/25/2009  Findings:      No specific mammographic evidence of malignancy.

## 2010-11-24 NOTE — Procedures (Signed)
Summary: Colonoscopy/[facility]  Colonoscopy/[facility]   Imported By: Sherian Rein 03/20/2009 08:55:35  _____________________________________________________________________  External Attachment:    Type:   Image     Comment:   External Document

## 2010-11-24 NOTE — Assessment & Plan Note (Signed)
Summary: NEW PT --PKG--$50---STC   Vital Signs:  Patient profile:   55 year old female Height:      68 inches (172.72 cm) Weight:      162.6 pounds (73.91 kg) BMI:     24.81 O2 Sat:      97 % Temp:     98.7 degrees F (37.06 degrees C) oral Pulse rate:   65 / minute BP sitting:   110 / 76  (right arm) Cuff size:   regular  Vitals Entered By: Orlan Leavens (Mar 17, 2009 8:09 AM) CC: New patient est Is Patient Diabetic? No Pain Assessment Patient in pain? no        Primary Care Provider:  Newt Lukes MD  CC:  New patient est.  History of Present Illness: former pt with dr. Duaine Dredge, HERE TO ESTABLISH CARE reveiwed records from his office that pt brought with her today -copy of pertinant labs, etc into emr  history of breast cancer. Has been cancer free since 1996 following lumpectomy, chemotherapy and radiation. She follows with Dr. Cyndie Chime. She has regular lab work done in his office, which she reports he irecommended be done with primary care physician. she is uncertain of what this lab work involves.  history of anemia. Patient believes her last lab work has shown no anemia, but she has had low blood count since her diagnosis of cancer. Likewise, she has also had history of low white count. She is not subject to infections and considers herself overall healthy.  she also has a history of scoliosis. She has no back complaints. She has not been seen by a spine specialist since she was in her teens.  she has no chest pains. However, given the family history of early coronary disease in her father and grandfather. She has never undergone a cardiac stress test and would like to do so at this time. No other personal history of dyslipidemia or hypertension. Remote smoking -quit in her 32s  lastly she is concerned regarding varicose veins. These are greater in the left leg than the right leg. She reports her mother believes these are a risk to her health. She has had no  change in the varicosities. No swelling or warmth in her legs. No shortness of breath. No history of other clots   Preventive Screening-Counseling & Management     Smoking Status: quit  Current Medications (verified): 1)  Multivitamins  Caps (Multiple Vitamin) 2)  Calcium 600/vitamin D 600-400 Mg-Unit Tabs (Calcium Carbonate-Vitamin D) .Marland Kitchen.. 1 By Mouth Qid  Allergies (verified): No Known Drug Allergies  Comments:  Nurse/Medical Assistant: The patient is currently on no medications and no changes to the medication list were required. Valentina Gu Brand (Mar 17, 2009 8:10 AM)  Past History:  Past Medical History:    Anemia-NOS    Breast cancer, hx of - L lumpectomy, chemo XRT - '96 - dr Cyndie Chime    sclosis -no pain    leukopenia, mild since chemo  Past Surgical History:    Tonsillectomy (1963)    Breast biopsy (1996)    Hip replacement (2002 & 2003) - [place] [facility]     Treatment for breat cancer (1996)  Family History:    Family History of Arthritis (parent)    Family History Breast cancer 1st degree relative <50 (parent & other blood relative)    Family History Diabetes 1st degree relative (grandparent)    Family History High cholesterol (parent)    Heart disease (parent &  grandparent)  Parkinson intestine cancer (parent 7 grandparent)  Social History:    Former Smoker    married - lives with spouse & son    Therapist, nutritional    enjoys golf, sports, bridge, swim - walking dog    Smoking Status:  quit  Review of Systems       see HPI above. I have reviewed all other systems and they were negative.   Physical Exam  General:  alert, well-developed, well-nourished, and cooperative to examination.    Eyes:  vision grossly intact; pupils equal, round and reactive to light.  conjunctiva and lids normal.    Ears:  normal pinnae bilaterally, without erythema, swelling, or tenderness to palpation. TMs clear, without effusion, or cerumen impaction. Hearing grossly normal  bilaterally  Mouth:  teeth and gums in good repair; mucous membranes moist, without lesions or ulcers. oropharynx clear without exudate, erythema.  Lungs:  normal respiratory effort, no intercostal retractions or use of accessory muscles; normal breath sounds bilaterally - no crackles and no wheezes.    Heart:  normal rate, regular rhythm, no murmur, and no rub. BLE without edema. normal DP pulses and normal cap refill in all 4 extremities   positive varicosities in left calf. Msk:  no joint effusions or deformities Neurologic:  alert & oriented X3 and cranial nerves II-XII symetrically intact.  strength normal in all extremities, sensation intact to light touch, and gait normal. speech fluent without dysarthria or aphasia  follows commands with good comprehension.  Skin:  no rashes, vesicles, ulcers, or erythema. No nodules or irregularity to palpation.  Psych:  Oriented X3, memory intact for recent and remote, normally interactive, good eye contact, not anxious appearing, not depressed appearing, and not agitated.      Impression & Recommendations:  Problem # 1:  SCOLIOSIS, MILD (ICD-737.30)  no c/o back pain but inc risk for degenerative arthritis - will make referal for eval by spine specialist - dr. 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 will be happy to do survelleince labs as desired by onc if we know what we are to check and how often  Problem # 5:  ARTHRITIS (ICD-716.90) s/p B HR 2002 and 2003 - no c/o no local ortho md and [facility] orth[facility] moved away since surg,  but will help with eval/referral if symptoms develop  Complete Medication List: 1)  Multivitamins Caps (Multiple vitamin) 2)  Calcium 600/vitamin D 600-400 Mg-unit Tabs (Calcium carbonate-vitamin d) .Marland Kitchen.. 1 by mouth qid  Patient Instructions: 1)  good to meet you today 2)  referral will be made to dr. Noel Gerold for eval of scoliosis and to cardiology for a cardiac stress test 3)  please make appt in 6-12 mos for physical/follow-up or as needed    Preventive Care Screening  Pap Smear:    Date:  10/03/2008    Results:  normal   Mammogram:    Date:  08/25/2008    Results:  normal   Bone Density:    Date:  06/10/2008    Results:  normal std dev  Last Tetanus  Booster:    Date:  04/24/2004    Results:  Td   Colonoscopy:    Date:  10/26/2003    Results:  normal

## 2010-11-26 NOTE — Letter (Signed)
Summary: [facility]  [facility]   Imported By: Lennie Odor 10/22/2010 16:01:56  _____________________________________________________________________  External Attachment:    Type:   Image     Comment:   External Document

## 2010-12-10 ENCOUNTER — Other Ambulatory Visit: Payer: Self-pay | Admitting: Oncology

## 2010-12-10 DIAGNOSIS — Z1231 Encounter for screening mammogram for malignant neoplasm of breast: Secondary | ICD-10-CM

## 2010-12-17 ENCOUNTER — Other Ambulatory Visit: Payer: Self-pay | Admitting: Obstetrics

## 2010-12-17 DIAGNOSIS — M8580 Other specified disorders of bone density and structure, unspecified site: Secondary | ICD-10-CM

## 2010-12-30 ENCOUNTER — Other Ambulatory Visit: Payer: Self-pay

## 2011-01-12 ENCOUNTER — Ambulatory Visit
Admission: RE | Admit: 2011-01-12 | Discharge: 2011-01-12 | Disposition: A | Discharge status: Home / Self Care | Payer: BC Managed Care – PPO | Source: Ambulatory Visit | Attending: Obstetrics | Admitting: Obstetrics

## 2011-01-12 DIAGNOSIS — M8580 Other specified disorders of bone density and structure, unspecified site: Secondary | ICD-10-CM

## 2011-03-12 NOTE — H&P (Signed)
[facility]  Patient:    Brittany [name], Brittany [name]                       MRN: [id number] Adm. Date:  06/24/00 Attending:  Sung Amabile. Roslyn Smiling, M.D.                         History and Physical  CHIEF COMPLAINT:              Endometrial mass on ultrasound .  HISTORY OF PRESENT ILLNESS:   A 55 year old woman, G3, P2-0-1-2, with a history of breast carcinoma, receiving tamoxifen for almost 5 years, is admitted for operative hysteroscopy after a hypoechoic mass measuring 1.0 x 0.5 was noted on yearly ultrasound which is performed because of tamoxifen use. The patient has a history of recurrent endometrial polyps and is status post operative hysteroscopy in 1997, 1998 and 2000. The patient has had no vaginal bleeding.  PAST MEDICAL HISTORY:         Osteoarthritis, breast carcinoma.  SURGICAL PROCEDURES:          T&A, left breast lumpectomy 1996 with axillary node dissection, operative hysteroscopies 1997, 1998, 2000, vaginal deliveries 1985 and 87.  ALLERGIES:                    ? SULFA.  MEDICATIONS:                  Tamoxifen, Vioxx, vitamin C & E, calcium.  FAMILY HISTORY:               Mother with history of breast carcinoma, father with history of MI.  SOCIAL HISTORY:               Married. She is a Geophysical data processor. She denies tobacco use. She drinks 1-2 alcoholic beverages per week.  PHYSICAL EXAMINATION:  GENERAL:                      Healthy appearing woman.  VITAL SIGNS:                  Stable.  HEENT:                        Within normal limits.  NECK:                         Without thyromegaly.  CHEST:                        Clear.  HEART:                        Regular rate and rhythm. S1, S2 normal.  BREASTS:                      Without masses, tenderness, axillary, supraclavicular nodes. Well healed left breast surgical scar.  ABDOMEN:                      Soft, nontender without organomegaly, mass,  or hernia.  BACK:                         Without CVA tenderness.  EXTERNAL GENITALIA:  External genitalia, BUS and vagina without lesions. Cervix without lesion. Uterus retroverted, top normal size, nontender and mobile. Adnexa normal to palpation.  EXTREMITIES:                  Without cyanosis, clubbing or edema.  SKIN:                         Without lesions.  NEUROLOGIC:                   Grossly intact.  ASSESSMENT/PLAN:              1. Ultrasound findings consistent with                                  recurring endometrial polyp.                               2. Tamoxifen use.                               3. History of breast carcinoma status post                                  left breast lumpectomy, 1996.  PLAN:                         Operative hysteroscopy and D&C. The patient has been counseled regarding the benefits, risks and options and expected outcome of the procedure prior to surgery. DD:  06/23/00 TD:  06/23/00 Job: [id number]

## 2011-03-12 NOTE — Op Note (Signed)
[facility]  Patient:    MANE, CONSOLO                     MRN: [id number] Proc. Date: 06/30/00 Adm. Date:  [id number] Attending:  Ardeen Fillers CC:         Sung Amabile. Roslyn Smiling, M.D.   Operative Report  PREOPERATIVE DIAGNOSIS:  Endometrial mass on ultrasound.  Tamoxifen use. History of endometrial polyps.  POSTOPERATIVE DIAGNOSIS:  Endometrial mass on ultrasound.  Tamoxifen use. History of endometrial polyps.  OPERATION:  Operative hysteroscopy and D&C.  SURGEON:  Sung Amabile. Roslyn Smiling, M.D.  ANESTHESIA:  IV sedation and paracervical block.  ESTIMATED BLOOD LOSS:  Less than 20 cc.  TUBES AND DRAINS:  None.  COMPLICATIONS:  None.  INDICATIONS:  The patient is a 55 year old woman, G3, P2-0-1-2, with a history of breast carcinoma, receiving tamoxifen for almost five years, admitted for operative hysteroscopy after a hypoechoic mass measuring 1.0 x 0.5 was noted on yearly ultrasound which was performed because of tamoxifen use.  The patient has a history of recurrent endometrial polyps and is status post operative hysteroscopy in 1997, 1998 and 2000.  She has had no complaints of vaginal bleeding.  She has been counseled regarding the benefits, risks, options and expected outcome of this procedure prior to surgery.  OPERATIVE FINDINGS:  Top normal size anterverted uterus.  Polypoid mass on posterior wall of endometrial cavity, resected.  SPECIMENS:  Endometrial resectoscopic biopsies and endometrial curettings to pathology.  DESCRIPTION OF PROCEDURE:  After the establishment of adequate IV sedation, the patient was place in the dorsal lithotomy position.  The perineum and vagina were prepped with Betadine solution and the bladder was evacuated with straight catheterization.  The patient was draped.  Examination under anesthesia revealing the above findings was carried out.  A bivalve speculum was inserted in the vagina.  The cervix was  reprepped with Betadine solution.  The anterior cervical lip was infiltrated with 1% Nesacaine.  Paracervical block was placed in the usual fashion using 22 cc of 1% Nesacaine.  Uterine sound was used to sound the uterine cavity to a depth of 9 cm.  Pratt dilators were used to serially dilate the cervical canal to a #33 [place].  Operative hysteroscope was introduced into the endometrial cavity without difficulty.  Sorbitol was the distending medium.  80 mmHg pressure for instillation of the medium was used.  Using a double 90 degree resectoscopic loop with settings of 190 and 110, cutting and coagulation respectively, the mass on the posterior endometrial wall was excised.  Hemostasis was noted. The scope was removed.  Gentle sharp ____________ curettage was performed. Instruments were removed and hemostasis was noted.  The patient was returned to the supine position and transported to the recovery room in satisfactory condition.  Sorbitol deficit at the end of the case was 120 cc. DD:  06/30/00 TD:  07/01/00 Job: [id number]

## 2011-08-18 ENCOUNTER — Other Ambulatory Visit: Payer: BC Managed Care – PPO

## 2011-08-18 ENCOUNTER — Telehealth: Payer: Self-pay | Admitting: *Deleted

## 2011-08-18 ENCOUNTER — Other Ambulatory Visit (INDEPENDENT_AMBULATORY_CARE_PROVIDER_SITE_OTHER): Payer: BC Managed Care – PPO

## 2011-08-18 DIAGNOSIS — Z0000 Encounter for general adult medical examination without abnormal findings: Secondary | ICD-10-CM

## 2011-08-18 LAB — HEPATIC FUNCTION PANEL
ALT: 21 U/L (ref 0–35)
AST: 22 U/L (ref 0–37)
Albumin: 3.8 g/dL (ref 3.5–5.2)
Alkaline Phosphatase: 53 U/L (ref 39–117)
Bilirubin, Direct: 0.1 mg/dL (ref 0.0–0.3)
Total Bilirubin: 0.4 mg/dL (ref 0.3–1.2)
Total Protein: 6.8 g/dL (ref 6.0–8.3)

## 2011-08-18 LAB — CBC WITH DIFFERENTIAL/PLATELET
Basophils Absolute: 0 10*3/uL (ref 0.0–0.1)
Basophils Relative: 0.4 % (ref 0.0–3.0)
Eosinophils Absolute: 0 10*3/uL (ref 0.0–0.7)
Eosinophils Relative: 1.3 % (ref 0.0–5.0)
HCT: 37.2 % (ref 36.0–46.0)
Hemoglobin: 12.8 g/dL (ref 12.0–15.0)
Lymphocytes Relative: 36.9 % (ref 12.0–46.0)
Lymphs Abs: 1.1 10*3/uL (ref 0.7–4.0)
MCHC: 34.5 g/dL (ref 30.0–36.0)
MCV: 96 fl (ref 78.0–100.0)
Monocytes Absolute: 0.3 10*3/uL (ref 0.1–1.0)
Monocytes Relative: 8.8 % (ref 3.0–12.0)
Neutro Abs: 1.6 10*3/uL (ref 1.4–7.7)
Neutrophils Relative %: 52.6 % (ref 43.0–77.0)
Platelets: 182 10*3/uL (ref 150.0–400.0)
RBC: 3.87 Mil/uL (ref 3.87–5.11)
RDW: 12.7 % (ref 11.5–14.6)
WBC: 3.1 10*3/uL — ABNORMAL LOW (ref 4.5–10.5)

## 2011-08-18 LAB — URINALYSIS, ROUTINE W REFLEX MICROSCOPIC
Bilirubin Urine: NEGATIVE
Hgb urine dipstick: NEGATIVE
Ketones, ur: NEGATIVE
Leukocytes, UA: NEGATIVE
Nitrite: NEGATIVE
Specific Gravity, Urine: 1.01 (ref 1.000–1.030)
Total Protein, Urine: NEGATIVE
Urine Glucose: NEGATIVE
Urobilinogen, UA: 0.2 (ref 0.0–1.0)
pH: 8 (ref 5.0–8.0)

## 2011-08-18 LAB — BASIC METABOLIC PANEL
BUN: 12 mg/dL (ref 6–23)
CO2: 28 mEq/L (ref 19–32)
Calcium: 9.2 mg/dL (ref 8.4–10.5)
Chloride: 108 mEq/L (ref 96–112)
Creatinine, Ser: 0.8 mg/dL (ref 0.4–1.2)
GFR: 76.28 mL/min (ref >60.00)
Glucose, Bld: 93 mg/dL (ref 70–99)
Potassium: 4.2 mEq/L (ref 3.5–5.1)
Sodium: 143 mEq/L (ref 135–145)

## 2011-08-18 LAB — LIPID PANEL
Cholesterol: 181 mg/dL (ref 0–200)
HDL: 78.1 mg/dL (ref >39.00)
LDL Cholesterol: 94 mg/dL (ref 0–99)
Total CHOL/HDL Ratio: 2
Triglycerides: 45 mg/dL (ref 0.0–149.0)
VLDL: 9 mg/dL (ref 0.0–40.0)

## 2011-08-18 LAB — TSH: TSH: 3.63 u[IU]/mL (ref 0.35–5.50)

## 2011-08-18 NOTE — Telephone Encounter (Signed)
Received msg pt is in labs for cpx labs. Need entered in EPIC

## 2011-08-25 ENCOUNTER — Encounter: Payer: Self-pay | Admitting: Internal Medicine

## 2011-08-31 ENCOUNTER — Telehealth: Payer: Self-pay | Admitting: *Deleted

## 2011-08-31 ENCOUNTER — Encounter: Payer: Self-pay | Admitting: Internal Medicine

## 2011-08-31 ENCOUNTER — Ambulatory Visit (INDEPENDENT_AMBULATORY_CARE_PROVIDER_SITE_OTHER): Payer: BC Managed Care – PPO | Admitting: Internal Medicine

## 2011-08-31 VITALS — BP 118/82 | HR 67 | Temp 98.6°F | Ht 68.0 in | Wt 160.0 lb

## 2011-08-31 DIAGNOSIS — Z0000 Encounter for general adult medical examination without abnormal findings: Secondary | ICD-10-CM

## 2011-08-31 DIAGNOSIS — Z853 Personal history of malignant neoplasm of breast: Secondary | ICD-10-CM

## 2011-08-31 DIAGNOSIS — G5760 Lesion of plantar nerve, unspecified lower limb: Secondary | ICD-10-CM

## 2011-08-31 NOTE — Patient Instructions (Signed)
It was good to see you today. Labs, exam and EKG look great - keep up the good work! we'll make referral to Dr Darrick Penna for orthotics/foot evaluation. Our office will contact you regarding appointment(s) once made. Health Maintenance reviewed - everything up to date - will pursue Shingles vaccine after age 57. Please schedule followup annually for physical and labs, call sooner if problems.

## 2011-08-31 NOTE — Telephone Encounter (Signed)
Received staff msg pt made cpx for 09/01/12. Need cpx labs in epic....08/31/11@9 :28am/LMB

## 2011-08-31 NOTE — Progress Notes (Signed)
Subjective:    Patient ID: Brittany [name], female    DOB: [birth year], 55 y.o.   MRN: [id number]  HPI patient is here today for annual physical. Patient feels well overall:  Complains of pain in left foot between third and fourth toes when standing for prolonged period of time or without shoes support. No precipitating injury recalled, no swelling or redness.  also reviewd prior medical history:  history of breast cancer - cancer free since 1996 following lumpectomy, chemotherapy and radiation. She follows with Dr. Cyndie Chime. She has regular lab work done in his office, which she reports he recommends be done with primary care physician.    history of scoliosis. She has no back complaints. 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    Past Medical History  Diagnosis Date  . ANEMIA-NOS   . Asymptomatic varicose veins   . BREAST CANCER, HX OF 1996  . OSTEOARTHRITIS   . SCOLIOSIS, MILD   . Leukopenia     mild since chemo   Family History  Problem Relation Age of Onset  . Arthritis Other   . Breast cancer Other   . Hyperlipidemia Other   . Heart disease Other    History  Substance Use Topics  . Smoking status: Former Games developer  . Smokeless tobacco: Not on file   Comment: Married-lives with spouse & son. Tree surgeon, enjoys golf, sports, bridge, swim, and walking dog  . Alcohol Use: No     Review of Systems Constitutional: Negative for fever or weight change.  Respiratory: Negative for cough and shortness of breath.   Cardiovascular: Negative for chest pain or palpitations.  Gastrointestinal: Negative for abdominal pain, no bowel changes.  Musculoskeletal: Negative for gait problem or joint swelling.  Skin: Negative for rash.  Neurological: Negative for dizziness or headache.  No other specific complaints in a complete review of systems (except as listed in HPI above).     Objective:   Physical Exam BP 118/82  Pulse  67  Temp(Src) 98.6 F (37 C) (Oral)  Ht 5\' 8"  (1.727 m)  Wt 160 lb (72.576 kg)  BMI 24.33 kg/m2  SpO2 98% Wt Readings from Last 3 Encounters:  08/31/11 160 lb (72.576 kg)  05/04/10 159 lb 1.9 oz (72.176 kg)  04/23/09 162 lb (73.483 kg)   Constitutional: She appears well-developed and well-nourished. No distress.  HENT: Head: Normocephalic and atraumatic. Ears: B TMs ok, no erythema or effusion; Nose: Nose normal.  Mouth/Throat: Oropharynx is clear and moist. No oropharyngeal exudate.  Eyes: Conjunctivae and EOM are normal. Pupils are equal, round, and reactive to light. No scleral icterus.  Neck: Normal range of motion. Neck supple. No JVD present. No thyromegaly present.  Cardiovascular: Normal rate, regular rhythm and normal heart sounds.  No murmur heard. No BLE edema. L>RLE varicose veins Pulmonary/Chest: Effort normal and breath sounds normal. No respiratory distress. She has no wheezes.  Abdominal: Soft. Bowel sounds are normal. She exhibits no distension. There is no tenderness. no masses Musculoskeletal: Pain in L foot at web space pressure between 3/4 toes consistent with Morton's neuroma. Normal range of motion, no joint effusions. No gross deformities Neurological: She is alert and oriented to person, place, and time. No cranial nerve deficit. Coordination normal.  Skin: Skin is warm and dry. No rash noted. No erythema.  onychomycosis of left third toenail with partial nail separation - no inflammation or cellulitis  Psychiatric: She has a  normal mood and affect. Her behavior is normal. Judgment and thought content normal.   Lab Results  Component Value Date   WBC 3.1* 08/18/2011   HGB 12.8 08/18/2011   HCT 37.2 08/18/2011   PLT 182.0 08/18/2011   GLUCOSE 93 08/18/2011   CHOL 181 08/18/2011   TRIG 45.0 08/18/2011   HDL 78.10 08/18/2011   LDLCALC 94 08/18/2011   ALT 21 08/18/2011   AST 22 08/18/2011   NA 143 08/18/2011   K 4.2 08/18/2011   CL 108 08/18/2011    CREATININE 0.8 08/18/2011   BUN 12 08/18/2011   CO2 28 08/18/2011   TSH 3.63 08/18/2011   EKG: sinus @ 64bpm, occ PAC       Assessment & Plan:  CPX - v70.0 - Patient has been counseled on age-appropriate routine health concerns for screening and prevention. These are reviewed and up-to-date. Immunizations are up-to-date or declined. Labs and ECG reviewed.  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 Diagnosis and education provided, patient requests fitting for orthotics. Referral to Dr. Darrick Penna with sports medicine done today

## 2011-09-18 ENCOUNTER — Telehealth: Payer: Self-pay | Admitting: Oncology

## 2011-09-18 NOTE — Telephone Encounter (Signed)
Called pt and left a message, to r/s appt for December that was cancelled due to [facility]

## 2011-09-20 ENCOUNTER — Telehealth: Payer: Self-pay | Admitting: Oncology

## 2011-09-20 NOTE — Telephone Encounter (Signed)
Sorry - no openings in December - I am away for a medical meeting then vacation

## 2011-09-21 ENCOUNTER — Ambulatory Visit (INDEPENDENT_AMBULATORY_CARE_PROVIDER_SITE_OTHER): Payer: BC Managed Care – PPO | Admitting: Sports Medicine

## 2011-09-21 VITALS — BP 106/70 | Ht 68.0 in | Wt 160.0 lb

## 2011-09-21 DIAGNOSIS — M79672 Pain in left foot: Secondary | ICD-10-CM | POA: Insufficient documentation

## 2011-09-21 DIAGNOSIS — M7741 Metatarsalgia, right foot: Secondary | ICD-10-CM | POA: Insufficient documentation

## 2011-09-21 DIAGNOSIS — M7750 Other enthesopathy of unspecified foot: Secondary | ICD-10-CM

## 2011-09-21 DIAGNOSIS — M79609 Pain in unspecified limb: Secondary | ICD-10-CM

## 2011-09-21 DIAGNOSIS — M7742 Metatarsalgia, left foot: Secondary | ICD-10-CM

## 2011-09-21 NOTE — Assessment & Plan Note (Signed)
There is fairly extensive breakdown of the transverse arch. Pretty good preservation of the longitudinal arch. She has a long-standing foot. Palpation reveals that the fourth metatarsal head drops through the capsule and this is her area of recurrent pain. We will use metatarsal padding and mechanical ways of reducing pain.

## 2011-09-21 NOTE — Patient Instructions (Signed)
Please try insoles with the metatarsal padding for 2-3 weeks, if foot pain feels better, bring your flat shoes into the office, and we will add metatarsal pads to those shoes.   You do not need an appointment for [place] to add this padding to your flats, just call ahead to let us know when you want to drop the shoes by.   Thank you for seeing [place] today!

## 2011-09-21 NOTE — Progress Notes (Signed)
  Subjective:    Patient ID: Brittany [name], female    DOB: 1956-06-19, 55 y.o.   MRN: [id number]  HPI  Pt presents to clinic for evaluation of left foot pain between 3rd and 4th MTs that she has dealt with for the past 3 years.  Pain is not on dorsal or plantar aspect, feels like it is inside the foot.  Does not recall a specific injury.  Has pain when walking in flats or shoes without good support, no pain when wearing running shoes.  Plays tennis, golf, and uses elliptical for exercise.   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

## 2011-09-21 NOTE — Assessment & Plan Note (Signed)
Metatarsal pad is placed on sports and soles slightly lateral to the center of the foot.  She is also given sports insoles and we'll test whether these relieve her pain over the next 2 weeks  If this works then I would like her to return and we will place pants and most of her walking shoes. If that gives her enough relief I would not suggest any further aggressive care.

## 2011-09-24 ENCOUNTER — Ambulatory Visit
Admission: RE | Admit: 2011-09-24 | Discharge: 2011-09-24 | Disposition: A | Discharge status: Home / Self Care | Payer: BC Managed Care – PPO | Source: Ambulatory Visit | Attending: Oncology | Admitting: Oncology

## 2011-09-24 DIAGNOSIS — Z1231 Encounter for screening mammogram for malignant neoplasm of breast: Secondary | ICD-10-CM

## 2011-10-05 ENCOUNTER — Other Ambulatory Visit: Payer: Self-pay | Admitting: Oncology

## 2011-10-05 ENCOUNTER — Other Ambulatory Visit (HOSPITAL_BASED_OUTPATIENT_CLINIC_OR_DEPARTMENT_OTHER): Payer: BC Managed Care – PPO | Admitting: Lab

## 2011-10-05 DIAGNOSIS — C50919 Malignant neoplasm of unspecified site of unspecified female breast: Secondary | ICD-10-CM

## 2011-10-05 DIAGNOSIS — Z170 Estrogen receptor positive status [ER+]: Secondary | ICD-10-CM

## 2011-10-05 LAB — CBC WITH DIFFERENTIAL/PLATELET
BASO%: 0.4 % (ref 0.0–2.0)
Basophils Absolute: 0 10*3/uL (ref 0.0–0.1)
EOS%: 0.5 % (ref 0.0–7.0)
Eosinophils Absolute: 0 10*3/uL (ref 0.0–0.5)
HCT: 38.5 % (ref 34.8–46.6)
HGB: 13.1 g/dL (ref 11.6–15.9)
LYMPH%: 23.9 % (ref 14.0–49.7)
MCH: 32.7 pg (ref 25.1–34.0)
MCHC: 34.1 g/dL (ref 31.5–36.0)
MCV: 95.9 fL (ref 79.5–101.0)
MONO#: 0.3 10*3/uL (ref 0.1–0.9)
MONO%: 5.3 % (ref 0.0–14.0)
NEUT#: 3.3 10*3/uL (ref 1.5–6.5)
NEUT%: 69.9 % (ref 38.4–76.8)
Platelets: 196 10*3/uL (ref 145–400)
RBC: 4.02 10*6/uL (ref 3.70–5.45)
RDW: 12.1 % (ref 11.2–14.5)
WBC: 4.8 10*3/uL (ref 3.9–10.3)
lymph#: 1.1 10*3/uL (ref 0.9–3.3)

## 2011-10-06 LAB — VITAMIN D 25 HYDROXY (VIT D DEFICIENCY, FRACTURES): Vit D, 25-Hydroxy: 55 ng/mL (ref 30–89)

## 2011-10-06 LAB — COMPREHENSIVE METABOLIC PANEL
ALT: 19 U/L (ref 0–35)
AST: 20 U/L (ref 0–37)
Albumin: 4.5 g/dL (ref 3.5–5.2)
Alkaline Phosphatase: 53 U/L (ref 39–117)
BUN: 15 mg/dL (ref 6–23)
CO2: 27 mEq/L (ref 19–32)
Calcium: 9.6 mg/dL (ref 8.4–10.5)
Chloride: 104 mEq/L (ref 96–112)
Creatinine, Ser: 0.84 mg/dL (ref 0.50–1.10)
Glucose, Bld: 80 mg/dL (ref 70–99)
Potassium: 3.8 mEq/L (ref 3.5–5.3)
Sodium: 139 mEq/L (ref 135–145)
Total Bilirubin: 0.4 mg/dL (ref 0.3–1.2)
Total Protein: 6.9 g/dL (ref 6.0–8.3)

## 2011-10-06 LAB — LACTATE DEHYDROGENASE: LDH: 176 U/L (ref 94–250)

## 2011-11-03 ENCOUNTER — Ambulatory Visit (INDEPENDENT_AMBULATORY_CARE_PROVIDER_SITE_OTHER): Payer: BC Managed Care – PPO | Admitting: Family Medicine

## 2011-11-03 ENCOUNTER — Encounter: Payer: Self-pay | Admitting: Family Medicine

## 2011-11-03 VITALS — BP 111/75 | HR 82 | Ht 68.0 in | Wt 160.0 lb

## 2011-11-03 DIAGNOSIS — M7750 Other enthesopathy of unspecified foot: Secondary | ICD-10-CM

## 2011-11-03 DIAGNOSIS — M7742 Metatarsalgia, left foot: Secondary | ICD-10-CM

## 2011-11-04 NOTE — Assessment & Plan Note (Signed)
2 pairs of metatarsal pads were issued. The patient was also given the catalogue to order more if she wants more.

## 2011-11-04 NOTE — Progress Notes (Signed)
  Subjective:    Patient ID: Brittany [name], female    DOB: 02-Jun-1956, 56 y.o.   MRN: [id number]  HPI  This visit was just to pick up extra metatarsal pads.  Review of Systems     Objective:   Physical Exam        Assessment & Plan:

## 2011-12-13 ENCOUNTER — Telehealth: Payer: Self-pay | Admitting: Oncology

## 2011-12-13 ENCOUNTER — Ambulatory Visit (HOSPITAL_BASED_OUTPATIENT_CLINIC_OR_DEPARTMENT_OTHER): Payer: BC Managed Care – PPO | Admitting: Oncology

## 2011-12-13 VITALS — BP 106/73 | HR 69 | Temp 97.0°F | Ht 68.0 in | Wt 157.5 lb

## 2011-12-13 DIAGNOSIS — Z1231 Encounter for screening mammogram for malignant neoplasm of breast: Secondary | ICD-10-CM

## 2011-12-13 DIAGNOSIS — C50919 Malignant neoplasm of unspecified site of unspecified female breast: Secondary | ICD-10-CM

## 2011-12-13 NOTE — Progress Notes (Signed)
Hematology and Oncology Follow Up Visit  Brittany [name] [id number] 1956/07/06 56 y.o. 12/13/2011 8:33 PM   Principle Diagnosis: Encounter Diagnosis  Name Primary?  . Stage II breast cancer Yes     Interim History:     Follow-up visit for this 31- year-old woman with a history of Stage II, one node positive, ER positive, invasive lobular carcinoma of the left breast diagnosed in April 1996.  She was treated with lumpectomy, breast radiation, 4 cycles of Adriamycin and Cytoxan, 5 years of tamoxifen, and the prolonged use of Evista through July 2004, then Femara through October 2008.  Primary tumor was 1.7 cm.    She is doing well. She's had no interim medical problems. She denies any headache or change in vision. No cardiorespiratory complaints. No change in bowel habit. No vaginal bleeding. Her gynecologist has suggested that she continues Estrace vaginal cream and wanted my input. She is now out, 17 years from her breast cancer and I think that the risk of using a topical estrogen vaginal cream is minimal. She is not having any bone pain other than that due to her known arthritis. 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        Medications: reviewed  Allergies: No Known Allergies  Review of Systems: Constitutional:   No constitutional symptoms Respiratory: See above  Cardiovascular: See above  Gastrointestinal: See above Genito-Urinary: See above Musculoskeletal: See above Neurologic: See above Skin: Remaining ROS negative.  Physical Exam: Blood pressure 106/73, pulse 69, temperature 97 F (36.1 C), temperature source Oral, height 5\' 8"  (1.727 m), weight 157 lb 8 oz (71.442 kg). Wt Readings from Last 3 Encounters:  12/13/11 157 lb 8 oz (71.442 kg)  11/03/11 160 lb (72.576 kg)  09/21/11 160 lb (72.576 kg)     General appearance: Healthy appearing Caucasian woman HENNT: Pharynx no erythema or exudate Lymph nodes: No  lymphadenopathy Breasts: Surgical changes in the left breast no dominant mass in either breast Lungs: Clear to auscultation resonant to percussion Heart: Regular cardiac rhythm no murmur Abdomen: Soft nontender no mass no organomegaly Extremities: No edema no calf tenderness Vascular: No cyanosis Neurologic: Motor strength 5 over 5 reflexes 1+ symmetric Skin: No rash or ecchymosis  Lab Results: Lab Results  Component Value Date   WBC 4.8 10/05/2011   HGB 13.1 10/05/2011   HCT 38.5 10/05/2011   MCV 95.9 10/05/2011   PLT 196 10/05/2011     Chemistry      Component Value Date/Time   NA 139 10/05/2011 1327   NA 139 10/05/2011 1327   NA 139 10/05/2011 1327   K 3.8 10/05/2011 1327   K 3.8 10/05/2011 1327   K 3.8 10/05/2011 1327   CL 104 10/05/2011 1327   CL 104 10/05/2011 1327   CL 104 10/05/2011 1327   CO2 27 10/05/2011 1327   CO2 27 10/05/2011 1327   CO2 27 10/05/2011 1327   BUN 15 10/05/2011 1327   BUN 15 10/05/2011 1327   BUN 15 10/05/2011 1327   CREATININE 0.84 10/05/2011 1327   CREATININE 0.84 10/05/2011 1327   CREATININE 0.84 10/05/2011 1327      Component Value Date/Time   CALCIUM 9.6 10/05/2011 1327   CALCIUM 9.6 10/05/2011 1327   CALCIUM 9.6 10/05/2011 1327   ALKPHOS 53 10/05/2011 1327   ALKPHOS 53 10/05/2011 1327   ALKPHOS 53 10/05/2011 1327   AST 20 10/05/2011 1327   AST 20 10/05/2011 1327  AST 20 10/05/2011 1327   ALT 19 10/05/2011 1327   ALT 19 10/05/2011 1327   ALT 19 10/05/2011 1327   BILITOT 0.4 10/05/2011 1327   BILITOT 0.4 10/05/2011 1327   BILITOT 0.4 10/05/2011 1327       Radiological Studies: A mamm[facility] done 09/24/2011 at the [facility] shows no new abnormalities.   Impression and Plan: Stage II 1 node positive ER positive lobular carcinoma left breast treated as outlined above. She remains free of any obvious recurrence now out a remarkable for 16 years from diagnosis. I will schedule an MRI of her breast as a 2 year interval  study to follow her next mammogram in November of this year.   CC:. Dr. Lisbeth Renshaw; Dr. Rene Paci   Levert Feinstein, MD 2/18/20138:33 PM

## 2011-12-13 NOTE — Telephone Encounter (Signed)
Pt scheduled for mammogram 09/25/12, attempted to schedule MRI of Breast, I was told that they do not schedule scan until 2 weeks before the MD visit. Reminded pt to follow up when she goes to Breast ctr for mammogram. Printed a copy to schdule MRI in December of 2013

## 2011-12-15 ENCOUNTER — Telehealth: Payer: Self-pay

## 2011-12-15 NOTE — Telephone Encounter (Signed)
Received call from [facility] at the [facility] requesting a new order for pt's breast MRI.  Per Olegario Messier, for insurance purposes the order should be for   Breast MRI with & without contrast Dx - hx of breast cancer (instead of stage II breast cancer).    Per Olegario Messier, study will not be scheduled until new order is placed.  Note to Dr Cyndie Chime. dph

## 2012-08-28 ENCOUNTER — Telehealth: Payer: Self-pay

## 2012-08-28 DIAGNOSIS — Z0000 Encounter for general adult medical examination without abnormal findings: Secondary | ICD-10-CM

## 2012-08-28 NOTE — Telephone Encounter (Signed)
Notified pt order put in epic...lmb

## 2012-08-28 NOTE — Telephone Encounter (Signed)
Ok to enter?

## 2012-08-28 NOTE — Telephone Encounter (Signed)
Pt called requesting order for CPX labs prior to annual exam 11/15, please advise.

## 2012-09-01 ENCOUNTER — Encounter: Payer: BC Managed Care – PPO | Admitting: Internal Medicine

## 2012-09-04 ENCOUNTER — Other Ambulatory Visit (INDEPENDENT_AMBULATORY_CARE_PROVIDER_SITE_OTHER): Payer: BC Managed Care – PPO

## 2012-09-04 DIAGNOSIS — Z0000 Encounter for general adult medical examination without abnormal findings: Secondary | ICD-10-CM

## 2012-09-04 LAB — BASIC METABOLIC PANEL
BUN: 13 mg/dL (ref 6–23)
CO2: 29 mEq/L (ref 19–32)
Calcium: 9 mg/dL (ref 8.4–10.5)
Chloride: 103 mEq/L (ref 96–112)
Creatinine, Ser: 0.8 mg/dL (ref 0.4–1.2)
GFR: 74.94 mL/min (ref >60.00)
Glucose, Bld: 90 mg/dL (ref 70–99)
Potassium: 4.4 mEq/L (ref 3.5–5.1)
Sodium: 138 mEq/L (ref 135–145)

## 2012-09-04 LAB — CBC WITH DIFFERENTIAL/PLATELET
Basophils Absolute: 0 10*3/uL (ref 0.0–0.1)
Basophils Relative: 0.6 % (ref 0.0–3.0)
Eosinophils Absolute: 0.1 10*3/uL (ref 0.0–0.7)
Eosinophils Relative: 1.8 % (ref 0.0–5.0)
HCT: 39.9 % (ref 36.0–46.0)
Hemoglobin: 13.3 g/dL (ref 12.0–15.0)
Lymphocytes Relative: 34.8 % (ref 12.0–46.0)
Lymphs Abs: 1.1 10*3/uL (ref 0.7–4.0)
MCHC: 33.4 g/dL (ref 30.0–36.0)
MCV: 95.8 fl (ref 78.0–100.0)
Monocytes Absolute: 0.3 10*3/uL (ref 0.1–1.0)
Monocytes Relative: 8.3 % (ref 3.0–12.0)
Neutro Abs: 1.8 10*3/uL (ref 1.4–7.7)
Neutrophils Relative %: 54.5 % (ref 43.0–77.0)
Platelets: 192 10*3/uL (ref 150.0–400.0)
RBC: 4.16 Mil/uL (ref 3.87–5.11)
RDW: 12.5 % (ref 11.5–14.6)
WBC: 3.3 10*3/uL — ABNORMAL LOW (ref 4.5–10.5)

## 2012-09-04 LAB — HEPATIC FUNCTION PANEL
ALT: 23 U/L (ref 0–35)
AST: 23 U/L (ref 0–37)
Albumin: 3.8 g/dL (ref 3.5–5.2)
Alkaline Phosphatase: 52 U/L (ref 39–117)
Bilirubin, Direct: 0.1 mg/dL (ref 0.0–0.3)
Total Bilirubin: 0.7 mg/dL (ref 0.3–1.2)
Total Protein: 6.7 g/dL (ref 6.0–8.3)

## 2012-09-04 LAB — LIPID PANEL
Cholesterol: 185 mg/dL (ref 0–200)
HDL: 70.8 mg/dL (ref >39.00)
LDL Cholesterol: 103 mg/dL — ABNORMAL HIGH (ref 0–99)
Total CHOL/HDL Ratio: 3
Triglycerides: 56 mg/dL (ref 0.0–149.0)
VLDL: 11.2 mg/dL (ref 0.0–40.0)

## 2012-09-04 LAB — URINALYSIS, ROUTINE W REFLEX MICROSCOPIC
Bilirubin Urine: NEGATIVE
Hgb urine dipstick: NEGATIVE
Ketones, ur: NEGATIVE
Nitrite: NEGATIVE
Specific Gravity, Urine: 1.01 (ref 1.000–1.030)
Total Protein, Urine: NEGATIVE
Urine Glucose: NEGATIVE
Urobilinogen, UA: 0.2 (ref 0.0–1.0)
pH: 7.5 (ref 5.0–8.0)

## 2012-09-04 LAB — TSH: TSH: 3.76 u[IU]/mL (ref 0.35–5.50)

## 2012-09-08 ENCOUNTER — Ambulatory Visit (INDEPENDENT_AMBULATORY_CARE_PROVIDER_SITE_OTHER): Payer: BC Managed Care – PPO | Admitting: Internal Medicine

## 2012-09-08 ENCOUNTER — Encounter: Payer: Self-pay | Admitting: Internal Medicine

## 2012-09-08 VITALS — BP 112/82 | HR 64 | Temp 98.7°F | Ht 68.0 in | Wt 155.0 lb

## 2012-09-08 DIAGNOSIS — Z23 Encounter for immunization: Secondary | ICD-10-CM

## 2012-09-08 DIAGNOSIS — Z0000 Encounter for general adult medical examination without abnormal findings: Secondary | ICD-10-CM

## 2012-09-08 NOTE — Patient Instructions (Signed)
It was good to see you today. We have reviewed your prior records including labs and tests today Health Maintenance reviewed - Tdap undated today -all recommended immunizations and age-appropriate screenings are up-to-date. Medications reviewed, no changes at this time. Please schedule followup in 1 year for labs and review, call sooner if problems. Health Maintenance, Females A healthy lifestyle and preventative care can promote health and wellness.  Maintain regular health, dental, and eye exams.   Eat a healthy diet. Foods like vegetables, fruits, whole grains, low-fat dairy products, and lean protein foods contain the nutrients you need without too many calories. Decrease your intake of foods high in solid fats, added sugars, and salt. Get information about a proper diet from your caregiver, if necessary.   Regular physical exercise is one of the most important things you can do for your health. Most adults should get at least 150 minutes of moderate-intensity exercise (any activity that increases your heart rate and causes you to sweat) each week. In addition, most adults need muscle-strengthening exercises on 2 or more days a week.     Maintain a healthy weight. The body mass index (BMI) is a screening tool to identify possible weight problems. It provides an estimate of body fat based on height and weight. Your caregiver can help determine your BMI, and can help you achieve or maintain a healthy weight. For adults 20 years and older:   A BMI below 18.5 is considered underweight.   A BMI of 18.5 to 24.9 is normal.   A BMI of 25 to 29.9 is considered overweight.   A BMI of 30 and above is considered obese.   Maintain normal blood lipids and cholesterol by exercising and minimizing your intake of saturated fat. Eat a balanced diet with plenty of fruits and vegetables. Blood tests for lipids and cholesterol should begin at age 47 and be repeated every 5 years. If your lipid or cholesterol  levels are high, you are over 50, or you are a high risk for heart disease, you may need your cholesterol levels checked more frequently. Ongoing high lipid and cholesterol levels should be treated with medicines if diet and exercise are not effective.   If you smoke, find out from your caregiver how to quit. If you do not use tobacco, do not start.   If you are pregnant, do not drink alcohol. If you are breastfeeding, be very cautious about drinking alcohol. If you are not pregnant and choose to drink alcohol, do not exceed 1 drink per day. One drink is considered to be 12 ounces (355 mL) of beer, 5 ounces (148 mL) of wine, or 1.5 ounces (44 mL) of liquor.   Avoid use of street drugs. Do not share needles with anyone. Ask for help if you need support or instructions about stopping the use of drugs.   High blood pressure causes heart disease and increases the risk of stroke. Blood pressure should be checked at least every 1 to 2 years. Ongoing high blood pressure should be treated with medicines, if weight loss and exercise are not effective.   If you are 3 to 58 years old, ask your caregiver if you should take aspirin to prevent strokes.   Diabetes screening involves taking a blood sample to check your fasting blood sugar level. This should be done once every 3 years, after age 36, if you are within normal weight and without risk factors for diabetes. Testing should be considered at a younger age or  be carried out more frequently if you are overweight and have at least 1 risk factor for diabetes.   Breast cancer screening is essential preventative care for women. You should practice "breast self-awareness." This means understanding the normal appearance and feel of your breasts and may include breast self-examination. Any changes detected, no matter how small, should be reported to a caregiver. Women in their 87s and 30s should have a clinical breast exam (CBE) by a caregiver as part of a regular  health exam every 1 to 3 years. After age 46, women should have a CBE every year. Starting at age 50, women should consider having a mammogram (breast X-ray) every year. Women who have a family history of breast cancer should talk to their caregiver about genetic screening. Women at a high risk of breast cancer should talk to their caregiver about having an MRI and a mammogram every year.   The Pap test is a screening test for cervical cancer. Women should have a Pap test starting at age 55. Between ages 49 and 79, Pap tests should be repeated every 2 years. Beginning at age 82, you should have a Pap test every 3 years as long as the past 3 Pap tests have been normal. If you had a hysterectomy for a problem that was not cancer or a condition that could lead to cancer, then you no longer need Pap tests. If you are between ages 38 and 38, and you have had normal Pap tests going back 10 years, you no longer need Pap tests. If you have had past treatment for cervical cancer or a condition that could lead to cancer, you need Pap tests and screening for cancer for at least 20 years after your treatment. If Pap tests have been discontinued, risk factors (such as a new sexual partner) need to be reassessed to determine if screening should be resumed. Some women have medical problems that increase the chance of getting cervical cancer. In these cases, your caregiver may recommend more frequent screening and Pap tests.   The human papillomavirus (HPV) test is an additional test that may be used for cervical cancer screening. The HPV test looks for the virus that can cause the cell changes on the cervix. The cells collected during the Pap test can be tested for HPV. The HPV test could be used to screen women aged 32 years and older, and should be used in women of any age who have unclear Pap test results. After the age of 51, women should have HPV testing at the same frequency as a Pap test.   Colorectal cancer can be  detected and often prevented. Most routine colorectal cancer screening begins at the age of 55 and continues through age 78. However, your caregiver may recommend screening at an earlier age if you have risk factors for colon cancer. On a yearly basis, your caregiver may provide home test kits to check for hidden blood in the stool. Use of a small camera at the end of a tube, to directly examine the colon (sigmoidoscopy or colonoscopy), can detect the earliest forms of colorectal cancer. Talk to your caregiver about this at age 104, when routine screening begins. Direct examination of the colon should be repeated every 5 to 10 years through age 4, unless early forms of pre-cancerous polyps or small growths are found.   Hepatitis C blood testing is recommended for all people born from [birth year] through 1967 and any individual with known risks for hepatitis C.  Practice safe sex. Use condoms and avoid high-risk sexual practices to reduce the spread of sexually transmitted infections (STIs). Sexually active women aged 12 and younger should be checked for Chlamydia, which is a common sexually transmitted infection. Older women with new or multiple partners should also be tested for Chlamydia. Testing for other STIs is recommended if you are sexually active and at increased risk.   Osteoporosis is a disease in which the bones lose minerals and strength with aging. This can result in serious bone fractures. The risk of osteoporosis can be identified using a bone density scan. Women ages 57 and over and women at risk for fractures or osteoporosis should discuss screening with their caregivers. Ask your caregiver whether you should be taking a calcium supplement or vitamin D to reduce the rate of osteoporosis.   Menopause can be associated with physical symptoms and risks. Hormone replacement therapy is available to decrease symptoms and risks. You should talk to your caregiver about whether hormone replacement therapy  is right for you.   Use sunscreen with a sun protection factor (SPF) of 30 or greater. Apply sunscreen liberally and repeatedly throughout the day. You should seek shade when your shadow is shorter than you. Protect yourself by wearing long sleeves, pants, a wide-brimmed hat, and sunglasses year round, whenever you are outdoors.   Notify your caregiver of new moles or changes in moles, especially if there is a change in shape or color. Also notify your caregiver if a mole is larger than the size of a pencil eraser.   Stay current with your immunizations.  Document Released: 04/26/2011 Document Revised: 01/03/2012 Document Reviewed: 04/26/2011 [facility] Patient Information 2013 [place], [place].

## 2012-09-08 NOTE — Progress Notes (Signed)
Subjective:    Patient ID: Brittany [name], female    DOB: [birth year], 56 y.o.   MRN: [id number]  HPI  patient is here today for annual physical. Patient feels well overall:  also reviewd prior medical history:  history of breast cancer - Stage II, one node positive, ER positive, invasive lobular carcinoma of the left breast diagnosed in April 1996.  She was treated with lumpectomy, breast radiation, 4 cycles of Adriamycin and Cytoxan, 5 years of tamoxifen, and the prolonged use of Evista through July 2004, then Femara through October 2008.  Primary tumor was 1.7 cm. She follows with onc Dr. Cyndie Chime annually. No evidence for recurrence    history of back curvature. She has no back complaints. 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    Past Medical History  Diagnosis Date  . ANEMIA-NOS   . Asymptomatic varicose veins   . BREAST CANCER, HX OF 01/1995 dx    s/p Lumpectomy, XRT, chemo and 44yr armidex  . OSTEOARTHRITIS   . SCOLIOSIS, MILD   . Leukopenia     mild since chemo   Family History  Problem Relation Age of Onset  . Arthritis Other   . Breast cancer Other   . Hyperlipidemia Other   . Heart disease Other    History  Substance Use Topics  . Smoking status: Former Games developer  . 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 Alcohol Use: No     Review of Systems  Constitutional: Negative for fever or weight change.  Respiratory: Negative for cough and shortness of breath.   Cardiovascular: Negative for chest pain or palpitations.  Gastrointestinal: Negative for abdominal pain, no bowel changes.  Musculoskeletal: Negative for gait problem or joint swelling.  Skin: Negative for rash.  Neurological: Negative for dizziness or headache.  No other specific complaints in a complete review of systems (except as listed in HPI  above).     Objective:   Physical Exam  BP 112/82  Pulse 64  Temp 98.7 F (37.1 C) (Oral)  Ht 5\' 8"  (1.727 m)  Wt 155 lb (70.308 kg)  BMI 23.57 kg/m2  SpO2 98% Wt Readings from Last 3 Encounters:  09/08/12 155 lb (70.308 kg)  12/13/11 157 lb 8 oz (71.442 kg)  11/03/11 160 lb (72.576 kg)   Constitutional: She appears well-developed and well-nourished. No distress.  HENT: Head: Normocephalic and atraumatic. Ears: B TMs ok, no erythema or effusion; Nose: Nose normal. Mouth/Throat: Oropharynx is clear and moist. No oropharyngeal exudate.  Eyes: Conjunctivae and EOM are normal. Pupils are equal, round, and reactive to light. No scleral icterus.  Neck: Normal range of motion. Neck supple. No JVD present. No thyromegaly present.  Cardiovascular: Normal rate, regular rhythm and normal heart sounds.  No murmur heard. No BLE edema. L>RLE varicose veins Pulmonary/Chest: Effort normal and breath sounds normal. No respiratory distress. She has no wheezes.  Abdominal: Soft. Bowel sounds are normal. She exhibits no distension. There is no tenderness. no masses Musculoskeletal: Normal range of motion, no joint effusions. No gross deformities Neurological: She is alert and oriented to person, place, and time. No cranial nerve deficit. Coordination normal.  Skin: Skin is warm and dry. No rash noted. No erythema.  Psychiatric: She has a normal mood and affect. Her behavior is normal. Judgment and thought content  normal.   Lab Results  Component Value Date   WBC 3.3* 09/04/2012   HGB 13.3 09/04/2012   HCT 39.9 09/04/2012   PLT 192.0 09/04/2012   GLUCOSE 90 09/04/2012   CHOL 185 09/04/2012   TRIG 56.0 09/04/2012   HDL 70.80 09/04/2012   LDLCALC 103* 09/04/2012   ALT 23 09/04/2012   AST 23 09/04/2012   NA 138 09/04/2012   K 4.4 09/04/2012   CL 103 09/04/2012   CREATININE 0.8 09/04/2012   BUN 13 09/04/2012   CO2 29 09/04/2012   TSH 3.76 09/04/2012   EKG: sinus @ 59 bpm - no ST-T changes  or arrythmia - unchanged from 08/2011      Assessment & Plan:  CPX - v70.0 - Patient has been counseled on age-appropriate routine health concerns for screening and prevention. These are reviewed and up-to-date. Immunizations are up-to-date or declined. Labs and ECG reviewed.

## 2012-09-25 ENCOUNTER — Ambulatory Visit
Admission: RE | Admit: 2012-09-25 | Discharge: 2012-09-25 | Disposition: A | Discharge status: Home / Self Care | Payer: BC Managed Care – PPO | Source: Ambulatory Visit | Attending: Oncology | Admitting: Oncology

## 2012-09-25 ENCOUNTER — Ambulatory Visit: Payer: BC Managed Care – PPO

## 2012-09-25 DIAGNOSIS — Z1231 Encounter for screening mammogram for malignant neoplasm of breast: Secondary | ICD-10-CM

## 2012-09-27 ENCOUNTER — Ambulatory Visit
Admission: RE | Admit: 2012-09-27 | Discharge: 2012-09-27 | Disposition: A | Discharge status: Home / Self Care | Payer: BC Managed Care – PPO | Source: Ambulatory Visit | Attending: Oncology | Admitting: Oncology

## 2012-09-27 DIAGNOSIS — C50919 Malignant neoplasm of unspecified site of unspecified female breast: Secondary | ICD-10-CM

## 2012-09-27 MED ORDER — GADOBENATE DIMEGLUMINE 529 MG/ML IV SOLN
14.0000 mL | Freq: Once | INTRAVENOUS | Status: AC | PRN
  Administered 2012-09-27: 14 mL via INTRAVENOUS

## 2012-09-29 ENCOUNTER — Telehealth: Payer: Self-pay | Admitting: *Deleted

## 2012-09-29 NOTE — Telephone Encounter (Signed)
Message copied by Orbie Hurst on Fri Sep 29, 2012 12:07 PM ------      Message from: Levert Feinstein      Created: Wed Sep 27, 2012  5:54 PM       Call pt MRI breast negative

## 2012-09-29 NOTE — Telephone Encounter (Signed)
Called and spoke with patient.  Let her know that MRI of her breast was fine.

## 2012-10-20 ENCOUNTER — Telehealth: Payer: Self-pay | Admitting: Oncology

## 2012-10-20 NOTE — Telephone Encounter (Signed)
Talked to patient and appt has been moved to 2/7 for lab and 2/14 for md due to MD's PAL

## 2012-11-25 LAB — HM PAP SMEAR

## 2012-12-01 ENCOUNTER — Other Ambulatory Visit (HOSPITAL_BASED_OUTPATIENT_CLINIC_OR_DEPARTMENT_OTHER): Payer: BC Managed Care – PPO | Admitting: Lab

## 2012-12-01 DIAGNOSIS — C50919 Malignant neoplasm of unspecified site of unspecified female breast: Secondary | ICD-10-CM

## 2012-12-01 LAB — COMPREHENSIVE METABOLIC PANEL (CC13)
ALT: 17 U/L (ref 0–55)
AST: 17 U/L (ref 5–34)
Albumin: 3.5 g/dL (ref 3.5–5.0)
Alkaline Phosphatase: 63 U/L (ref 40–150)
BUN: 12.1 mg/dL (ref 7.0–26.0)
CO2: 26 mEq/L (ref 22–29)
Calcium: 9.1 mg/dL (ref 8.4–10.4)
Chloride: 102 mEq/L (ref 98–107)
Creatinine: 1 mg/dL (ref 0.6–1.1)
Glucose: 88 mg/dl (ref 70–99)
Potassium: 4.1 mEq/L (ref 3.5–5.1)
Sodium: 137 mEq/L (ref 136–145)
Total Bilirubin: 0.44 mg/dL (ref 0.20–1.20)
Total Protein: 6.8 g/dL (ref 6.4–8.3)

## 2012-12-01 LAB — CBC WITH DIFFERENTIAL/PLATELET
BASO%: 0.8 % (ref 0.0–2.0)
Basophils Absolute: 0 10*3/uL (ref 0.0–0.1)
EOS%: 1.9 % (ref 0.0–7.0)
Eosinophils Absolute: 0.1 10*3/uL (ref 0.0–0.5)
HCT: 37.9 % (ref 34.8–46.6)
HGB: 13 g/dL (ref 11.6–15.9)
LYMPH%: 32.2 % (ref 14.0–49.7)
MCH: 32.3 pg (ref 25.1–34.0)
MCHC: 34.3 g/dL (ref 31.5–36.0)
MCV: 94.2 fL (ref 79.5–101.0)
MONO#: 0.2 10*3/uL (ref 0.1–0.9)
MONO%: 7.9 % (ref 0.0–14.0)
NEUT#: 1.6 10*3/uL (ref 1.5–6.5)
NEUT%: 57.2 % (ref 38.4–76.8)
Platelets: 182 10*3/uL (ref 145–400)
RBC: 4.02 10*6/uL (ref 3.70–5.45)
RDW: 12.2 % (ref 11.2–14.5)
WBC: 2.8 10*3/uL — ABNORMAL LOW (ref 3.9–10.3)
lymph#: 0.9 10*3/uL (ref 0.9–3.3)

## 2012-12-01 LAB — LACTATE DEHYDROGENASE (CC13): LDH: 173 U/L (ref 125–245)

## 2012-12-02 LAB — VITAMIN D 25 HYDROXY (VIT D DEFICIENCY, FRACTURES): Vit D, 25-Hydroxy: 45 ng/mL (ref 30–89)

## 2012-12-08 ENCOUNTER — Encounter: Payer: Self-pay | Admitting: Oncology

## 2012-12-08 ENCOUNTER — Ambulatory Visit: Payer: BC Managed Care – PPO | Admitting: Oncology

## 2012-12-08 ENCOUNTER — Telehealth: Payer: Self-pay | Admitting: Oncology

## 2012-12-08 ENCOUNTER — Ambulatory Visit (HOSPITAL_BASED_OUTPATIENT_CLINIC_OR_DEPARTMENT_OTHER): Payer: BC Managed Care – PPO | Admitting: Oncology

## 2012-12-08 ENCOUNTER — Other Ambulatory Visit: Payer: BC Managed Care – PPO | Admitting: Lab

## 2012-12-08 VITALS — BP 105/70 | HR 60 | Temp 97.6°F | Resp 20 | Ht 68.0 in | Wt 156.2 lb

## 2012-12-08 DIAGNOSIS — D72819 Decreased white blood cell count, unspecified: Secondary | ICD-10-CM

## 2012-12-08 DIAGNOSIS — Z853 Personal history of malignant neoplasm of breast: Secondary | ICD-10-CM

## 2012-12-08 NOTE — Patient Instructions (Signed)
Lab in 6 months Lab, visit & mammograms in 1 year

## 2012-12-08 NOTE — Progress Notes (Signed)
57 year old woman I follow for previous diagnosis of stage II one node positive ER positive breast cancer diagnosed back in 1996. She underwent lumpectomy, 4 cycles of Adriamycin Cytoxan, and then hormonal therapy. She has been in a continuous remission now for 18 years. She had recent mammograms on December 2 and breast MRI on 09/27/2012 and these showed no new disease.  I brought her in sooner than her usual annual visit when there appeared to be a trend for progressive fall in her white count. 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 When she was undergoing evaluation for the hip replacements in the past she was told that she may have had a positive rheumatoid factor. She has really not developed any polyarthralgias over the years. She recently is having problems with a single digit on her right hand but was out shoveling snow yesterday. She reports that her mother has crippling rheumatoid arthritis. She's not on any medications that would cause leukopenia.  Exam today limited to her hands. There is some fusiform swelling third digit right hand at the MIP joint. No other joint deformities.  CBC today with total white count 2800, 57% neutrophils, 32% lymphocytes, 8% monocytes, hemoglobin 13, platelets 182,000.  Looking back over flow sheets as far back as 2007, she has had significant fluctuation in her white counts. There is a previous white count recorded at 2800 in July 2011. Her peak white count over the last 7 years at 4800 as recently as 10/05/2011. No trend for progressive anemia or thrombocytopenia.  Impression #1. Fluctuating leukopenia with normal white count differential and absence of anemia or thrombocytopenia. No offending medications. This may be a footprint of the previous chemotherapy with mild bone marrow dysfunction. Alternatively, this may be immune neutropenia secondary to developing rheumatoid/inflammatory arthritis.  I told her I don't have  any major concerns at this time. She consistently has neutrophils of 1000 or above and has not had problems with recurrent infections. White count differential is normal despite decreased total white count. I don't think we need to do a bone marrow biopsy since results would not change our management.  Plan: 1 check counts again in 6 months and add ANA and rheumatoid factor.  #2. Stage II ER positive breast cancer in continuous remission now for 18 years. Continue annual mammograms and every other year MRI scans.  More than 50% of this visit spent with face-to-face counseling with the patient

## 2012-12-12 ENCOUNTER — Ambulatory Visit: Payer: BC Managed Care – PPO | Admitting: Oncology

## 2012-12-12 ENCOUNTER — Telehealth: Payer: Self-pay | Admitting: *Deleted

## 2012-12-12 NOTE — Telephone Encounter (Signed)
Message given to [facility]

## 2012-12-12 NOTE — Telephone Encounter (Addendum)
Received call from pt stating that she is appealing a medical denial & reports that insurance co has been unsuccessful reaching Dr Cyndie Chime.  She reports that she needs medical records & unfortunately needs to be faxed tomorrow b/c deadline is almost complete.  She reports that she needs a diagnosis/explanation for MRI in addition to mammogram. She can be reached at [phone].  She reports the information needs to go to [facility], attention Lynett Grimes, appeal ID [phone] & fax to [phone]. Note to Dr Cyndie Chime.

## 2012-12-13 ENCOUNTER — Telehealth: Payer: Self-pay | Admitting: Oncology

## 2012-12-13 NOTE — Telephone Encounter (Signed)
Rec's faxed Lynett Grimes @ [phone] ref# [phone].

## 2013-06-04 ENCOUNTER — Other Ambulatory Visit (HOSPITAL_BASED_OUTPATIENT_CLINIC_OR_DEPARTMENT_OTHER): Payer: BC Managed Care – PPO | Admitting: Lab

## 2013-06-04 DIAGNOSIS — D72819 Decreased white blood cell count, unspecified: Secondary | ICD-10-CM

## 2013-06-04 DIAGNOSIS — Z853 Personal history of malignant neoplasm of breast: Secondary | ICD-10-CM

## 2013-06-04 LAB — CBC WITH DIFFERENTIAL/PLATELET
BASO%: 0.5 % (ref 0.0–2.0)
Basophils Absolute: 0 10*3/uL (ref 0.0–0.1)
EOS%: 1.6 % (ref 0.0–7.0)
Eosinophils Absolute: 0 10*3/uL (ref 0.0–0.5)
HCT: 37.6 % (ref 34.8–46.6)
HGB: 12.9 g/dL (ref 11.6–15.9)
LYMPH%: 37.5 % (ref 14.0–49.7)
MCH: 32.5 pg (ref 25.1–34.0)
MCHC: 34.2 g/dL (ref 31.5–36.0)
MCV: 95 fL (ref 79.5–101.0)
MONO#: 0.2 10*3/uL (ref 0.1–0.9)
MONO%: 8.7 % (ref 0.0–14.0)
NEUT#: 1.4 10*3/uL — ABNORMAL LOW (ref 1.5–6.5)
NEUT%: 51.7 % (ref 38.4–76.8)
Platelets: 179 10*3/uL (ref 145–400)
RBC: 3.96 10*6/uL (ref 3.70–5.45)
RDW: 12.3 % (ref 11.2–14.5)
WBC: 2.8 10*3/uL — ABNORMAL LOW (ref 3.9–10.3)
lymph#: 1 10*3/uL (ref 0.9–3.3)

## 2013-06-04 LAB — CHCC SMEAR

## 2013-06-05 LAB — RHEUMATOID FACTOR: Rhuematoid fact SerPl-aCnc: <10 IU/mL (ref <=14)

## 2013-06-05 LAB — ANA: Anti Nuclear Antibody(ANA): NEGATIVE

## 2013-06-07 ENCOUNTER — Telehealth: Payer: Self-pay | Admitting: *Deleted

## 2013-06-07 NOTE — Telephone Encounter (Signed)
Spoke with patient.  Let her know that white count is still running low but no change from 6 mos. Ago.  Screening tests for lupus and RA are negative.  There is nothing else to be done right now.  She appreciated the call.

## 2013-06-07 NOTE — Telephone Encounter (Signed)
Message copied by Orbie Hurst on Thu Jun 07, 2013  4:47 PM ------      Message from: Levert Feinstein      Created: Thu Jun 07, 2013  7:35 AM       Call patient - white count still running low but no change from 6 months ago.  Screening tests for lupus and rheumatoid arthritis are negative.  Nothing else needs to be done right now. ------

## 2013-08-24 ENCOUNTER — Other Ambulatory Visit: Payer: Self-pay | Admitting: Oncology

## 2013-08-24 DIAGNOSIS — Z853 Personal history of malignant neoplasm of breast: Secondary | ICD-10-CM

## 2013-08-24 DIAGNOSIS — Z1231 Encounter for screening mammogram for malignant neoplasm of breast: Secondary | ICD-10-CM

## 2013-09-10 ENCOUNTER — Encounter: Payer: BC Managed Care – PPO | Admitting: Internal Medicine

## 2013-09-12 ENCOUNTER — Other Ambulatory Visit (INDEPENDENT_AMBULATORY_CARE_PROVIDER_SITE_OTHER): Payer: BC Managed Care – PPO

## 2013-09-12 ENCOUNTER — Telehealth: Payer: Self-pay | Admitting: *Deleted

## 2013-09-12 DIAGNOSIS — Z0000 Encounter for general adult medical examination without abnormal findings: Secondary | ICD-10-CM

## 2013-09-12 LAB — URINALYSIS, ROUTINE W REFLEX MICROSCOPIC
Bilirubin Urine: NEGATIVE
Hgb urine dipstick: NEGATIVE
Ketones, ur: NEGATIVE
Leukocytes, UA: NEGATIVE
Nitrite: NEGATIVE
RBC / HPF: NONE SEEN (ref 0–?)
Specific Gravity, Urine: 1.015 (ref 1.000–1.030)
Total Protein, Urine: NEGATIVE
Urine Glucose: NEGATIVE
Urobilinogen, UA: 0.2 (ref 0.0–1.0)
pH: 7.5 (ref 5.0–8.0)

## 2013-09-12 LAB — CBC WITH DIFFERENTIAL/PLATELET
Basophils Absolute: 0 10*3/uL (ref 0.0–0.1)
Basophils Relative: 0.4 % (ref 0.0–3.0)
Eosinophils Absolute: 0 10*3/uL (ref 0.0–0.7)
Eosinophils Relative: 1.7 % (ref 0.0–5.0)
HCT: 38.3 % (ref 36.0–46.0)
Hemoglobin: 13.1 g/dL (ref 12.0–15.0)
Lymphocytes Relative: 36.2 % (ref 12.0–46.0)
Lymphs Abs: 1 10*3/uL (ref 0.7–4.0)
MCHC: 34.1 g/dL (ref 30.0–36.0)
MCV: 94.8 fl (ref 78.0–100.0)
Monocytes Absolute: 0.3 10*3/uL (ref 0.1–1.0)
Monocytes Relative: 10.5 % (ref 3.0–12.0)
Neutro Abs: 1.4 10*3/uL (ref 1.4–7.7)
Neutrophils Relative %: 51.2 % (ref 43.0–77.0)
Platelets: 190 10*3/uL (ref 150.0–400.0)
RBC: 4.04 Mil/uL (ref 3.87–5.11)
RDW: 12.8 % (ref 11.5–14.6)
WBC: 2.8 10*3/uL — ABNORMAL LOW (ref 4.5–10.5)

## 2013-09-12 LAB — HEPATIC FUNCTION PANEL
ALT: 27 U/L (ref 0–35)
AST: 30 U/L (ref 0–37)
Albumin: 3.9 g/dL (ref 3.5–5.2)
Alkaline Phosphatase: 47 U/L (ref 39–117)
Bilirubin, Direct: 0.1 mg/dL (ref 0.0–0.3)
Total Bilirubin: 0.9 mg/dL (ref 0.3–1.2)
Total Protein: 6.8 g/dL (ref 6.0–8.3)

## 2013-09-12 LAB — BASIC METABOLIC PANEL
BUN: 14 mg/dL (ref 6–23)
CO2: 29 mEq/L (ref 19–32)
Calcium: 9.5 mg/dL (ref 8.4–10.5)
Chloride: 101 mEq/L (ref 96–112)
Creatinine, Ser: 0.8 mg/dL (ref 0.4–1.2)
GFR: 76.81 mL/min (ref >60.00)
Glucose, Bld: 52 mg/dL — ABNORMAL LOW (ref 70–99)
Potassium: 4.6 mEq/L (ref 3.5–5.1)
Sodium: 137 mEq/L (ref 135–145)

## 2013-09-12 LAB — LIPID PANEL
Cholesterol: 182 mg/dL (ref 0–200)
HDL: 78.8 mg/dL (ref >39.00)
LDL Cholesterol: 94 mg/dL (ref 0–99)
Total CHOL/HDL Ratio: 2
Triglycerides: 48 mg/dL (ref 0.0–149.0)
VLDL: 9.6 mg/dL (ref 0.0–40.0)

## 2013-09-12 LAB — TSH: TSH: 4.87 u[IU]/mL (ref 0.35–5.50)

## 2013-09-12 NOTE — Telephone Encounter (Signed)
Received fall from lab pt wanting cpx labs entered. Have cpx schedule for 09/17/13...lmb

## 2013-09-14 ENCOUNTER — Encounter: Payer: Self-pay | Admitting: Internal Medicine

## 2013-09-14 ENCOUNTER — Ambulatory Visit (INDEPENDENT_AMBULATORY_CARE_PROVIDER_SITE_OTHER): Payer: BC Managed Care – PPO | Admitting: Internal Medicine

## 2013-09-14 VITALS — BP 112/82 | HR 63 | Temp 98.5°F | Ht 68.0 in | Wt 151.6 lb

## 2013-09-14 DIAGNOSIS — N959 Unspecified menopausal and perimenopausal disorder: Secondary | ICD-10-CM

## 2013-09-14 DIAGNOSIS — N951 Menopausal and female climacteric states: Secondary | ICD-10-CM

## 2013-09-14 DIAGNOSIS — E162 Hypoglycemia, unspecified: Secondary | ICD-10-CM

## 2013-09-14 DIAGNOSIS — Z0000 Encounter for general adult medical examination without abnormal findings: Secondary | ICD-10-CM

## 2013-09-14 MED ORDER — CALCIUM CITRATE 250 MG PO TABS: 4.0000 | ORAL_TABLET | Freq: Every day | ORAL | Status: DC

## 2013-09-14 NOTE — Progress Notes (Signed)
Subjective:    Patient ID: Brittany [name], female    DOB: 08-[name]-1957, 57 y.o.   MRN: [id number]  HPI patient is here today for annual physical. Patient feels well overall:  also reviewed prior medical history:  history of breast cancer - Stage II, one node positive, ER positive, invasive lobular carcinoma of the left breast diagnosed in April 1996.  She was treated with lumpectomy, breast radiation, 4 cycles of Adriamycin and Cytoxan, 5 years of tamoxifen, and the prolonged use of Evista through July 2004, then Femara through October 2008.  Primary tumor was 1.7 cm. She follows with onc Dr. Cyndie Chime annually. No evidence for recurrence    history of back curvature. She has no back complaints. 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    Past Medical History  Diagnosis Date  . ANEMIA-NOS   . Asymptomatic varicose veins   . BREAST CANCER, HX OF 01/1995 dx    s/p Lumpectomy, XRT, chemo and 40yr armidex  . OSTEOARTHRITIS   . SCOLIOSIS, MILD   . Leukopenia     mild since chemo   Family History  Problem Relation Age of Onset  . Arthritis Other   . Breast cancer Other   . Hyperlipidemia Other   . Heart disease Other    History  Substance Use Topics  . Smoking status: Former Games developer  . 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 Alcohol Use: No     Review of Systems  Constitutional: Negative for fatigue and unexpected weight change.  Respiratory: Negative for cough, shortness of breath and wheezing.   Cardiovascular: Negative for chest pain, palpitations and leg swelling.  Gastrointestinal: Negative for nausea, abdominal pain and diarrhea.  Neurological: Negative for dizziness, weakness, light-headedness and headaches.  Psychiatric/Behavioral: Negative for dysphoric mood. The patient is not nervous/anxious.   All other  systems reviewed and are negative.        Objective:   Physical Exam BP 112/82  Pulse 63  Temp(Src) 98.5 F (36.9 C) (Oral)  Ht 5\' 8"  (1.727 m)  Wt 151 lb 9.6 oz (68.765 kg)  BMI 23.06 kg/m2  SpO2 99% Wt Readings from Last 3 Encounters:  09/14/13 151 lb 9.6 oz (68.765 kg)  12/08/12 156 lb 3.2 oz (70.852 kg)  09/08/12 155 lb (70.308 kg)   Constitutional: She appears well-developed and well-nourished. No distress.  HENT: Head: Normocephalic and atraumatic. Ears: B TMs ok, no erythema or effusion; Nose: Nose normal. Mouth/Throat: Oropharynx is clear and moist. No oropharyngeal exudate.  Eyes: Conjunctivae and EOM are normal. Pupils are equal, round, and reactive to light. No scleral icterus.  Neck: Normal range of motion. Neck supple. No JVD present. No thyromegaly present.  Cardiovascular: Normal rate, regular rhythm and normal heart sounds.  No murmur heard. No BLE edema. L>RLE varicose veins Pulmonary/Chest: Effort normal and breath sounds normal. No respiratory distress. She has no wheezes.  Abdominal: Soft. Bowel sounds are normal. She exhibits no distension. There is no tenderness. no masses Musculoskeletal: Normal range of motion, no joint effusions. No gross deformities Neurological: She is alert and oriented to person, place, and time. No cranial nerve deficit. Coordination normal.  Skin: Skin is warm and dry. No rash noted. No erythema.  Psychiatric: She has a normal mood and affect. Her behavior is normal. Judgment and thought content  normal.   Lab Results  Component Value Date   WBC 2.8* 09/12/2013   HGB 13.1 09/12/2013   HCT 38.3 09/12/2013   PLT 190.0 09/12/2013   GLUCOSE 52* 09/12/2013   CHOL 182 09/12/2013   TRIG 48.0 09/12/2013   HDL 78.80 09/12/2013   LDLCALC 94 09/12/2013   ALT 27 09/12/2013   AST 30 09/12/2013   NA 137 09/12/2013   K 4.6 09/12/2013   CL 101 09/12/2013   CREATININE 0.8 09/12/2013   BUN 14 09/12/2013   CO2 29 09/12/2013   TSH 4.87  09/12/2013        Assessment & Plan:  CPX - v70.0 - Patient has been counseled on age-appropriate routine health concerns for screening and prevention. These are reviewed and up-to-date. Immunizations are up-to-date or declined. Labs reviewed.  PMP status, s/p chemo for breast ca - follow up DEXA - last normal results 12/2010 reviewed  Mild hypoglycemia - ?lab error vs exertion reactive state/fasting - reports need for frequent meals/snacks but no symptoms hypoglycemia- Education provided on same - will call if symptomatic or other problems

## 2013-09-14 NOTE — Progress Notes (Signed)
Pre-visit discussion using our clinic review tool. No additional management support is needed unless otherwise documented below in the visit note.  

## 2013-09-14 NOTE — Patient Instructions (Addendum)
It was good to see you today.  We have reviewed your prior records including labs and tests today  Health Maintenance reviewed - all recommended immunizations and age-appropriate screenings are up-to-date. Shingles vaccine after age 59!  Medications reviewed and updated, no changes recommended at this time.  we'll make referral for bone density screening . Our office will contact you regarding appointment(s) once made.  Please schedule followup in 12 months for annual exam/labs, call sooner if problems.  Health Maintenance, Female A healthy lifestyle and preventative care can promote health and wellness.  Maintain regular health, dental, and eye exams.  Eat a healthy diet. Foods like vegetables, fruits, whole grains, low-fat dairy products, and lean protein foods contain the nutrients you need without too many calories. Decrease your intake of foods high in solid fats, added sugars, and salt. Get information about a proper diet from your caregiver, if necessary.  Regular physical exercise is one of the most important things you can do for your health. Most adults should get at least 150 minutes of moderate-intensity exercise (any activity that increases your heart rate and causes you to sweat) each week. In addition, most adults need muscle-strengthening exercises on 2 or more days a week.   Maintain a healthy weight. The body mass index (BMI) is a screening tool to identify possible weight problems. It provides an estimate of body fat based on height and weight. Your caregiver can help determine your BMI, and can help you achieve or maintain a healthy weight. For adults 20 years and older:  A BMI below 18.5 is considered underweight.  A BMI of 18.5 to 24.9 is normal.  A BMI of 25 to 29.9 is considered overweight.  A BMI of 30 and above is considered obese.  Maintain normal blood lipids and cholesterol by exercising and minimizing your intake of saturated fat. Eat a balanced diet with  plenty of fruits and vegetables. Blood tests for lipids and cholesterol should begin at age 53 and be repeated every 5 years. If your lipid or cholesterol levels are high, you are over 50, or you are a high risk for heart disease, you may need your cholesterol levels checked more frequently.Ongoing high lipid and cholesterol levels should be treated with medicines if diet and exercise are not effective.  If you smoke, find out from your caregiver how to quit. If you do not use tobacco, do not start.  Lung cancer screening is recommended for adults aged 49 80 years who are at high risk for developing lung cancer because of a history of smoking. Yearly low-dose computed tomography (CT) is recommended for people who have at least a 30-pack-year history of smoking and are a current smoker or have quit within the past 15 years. A pack year of smoking is smoking an average of 1 pack of cigarettes a day for 1 year (for example: 1 pack a day for 30 years or 2 packs a day for 15 years). Yearly screening should continue until the smoker has stopped smoking for at least 15 years. Yearly screening should also be stopped for people who develop a health problem that would prevent them from having lung cancer treatment.  If you are pregnant, do not drink alcohol. If you are breastfeeding, be very cautious about drinking alcohol. If you are not pregnant and choose to drink alcohol, do not exceed 1 drink per day. One drink is considered to be 12 ounces (355 mL) of beer, 5 ounces (148 mL) of wine, or  1.5 ounces (44 mL) of liquor.  Avoid use of street drugs. Do not share needles with anyone. Ask for help if you need support or instructions about stopping the use of drugs.  High blood pressure causes heart disease and increases the risk of stroke. Blood pressure should be checked at least every 1 to 2 years. Ongoing high blood pressure should be treated with medicines, if weight loss and exercise are not effective.  If you  are 92 to 59 years old, ask your caregiver if you should take aspirin to prevent strokes.  Diabetes screening involves taking a blood sample to check your fasting blood sugar level. This should be done once every 3 years, after age 48, if you are within normal weight and without risk factors for diabetes. Testing should be considered at a younger age or be carried out more frequently if you are overweight and have at least 1 risk factor for diabetes.  Breast cancer screening is essential preventative care for women. You should practice "breast self-awareness." This means understanding the normal appearance and feel of your breasts and may include breast self-examination. Any changes detected, no matter how small, should be reported to a caregiver. Women in their 35s and 30s should have a clinical breast exam (CBE) by a caregiver as part of a regular health exam every 1 to 3 years. After age 69, women should have a CBE every year. Starting at age 2, women should consider having a mammogram (breast X-ray) every year. Women who have a family history of breast cancer should talk to their caregiver about genetic screening. Women at a high risk of breast cancer should talk to their caregiver about having an MRI and a mammogram every year.  Breast cancer gene (BRCA)-related cancer risk assessment is recommended for women who have family members with BRCA-related cancers. BRCA-related cancers include breast, ovarian, tubal, and peritoneal cancers. Having family members with these cancers may be associated with an increased risk for harmful changes (mutations) in the breast cancer genes BRCA1 and BRCA2. Results of the assessment will determine the need for genetic counseling and BRCA1 and BRCA2 testing.  The Pap test is a screening test for cervical cancer. Women should have a Pap test starting at age 9. Between ages 6 and 26, Pap tests should be repeated every 2 years. Beginning at age 62, you should have a Pap  test every 3 years as long as the past 3 Pap tests have been normal. If you had a hysterectomy for a problem that was not cancer or a condition that could lead to cancer, then you no longer need Pap tests. If you are between ages 70 and 81, and you have had normal Pap tests going back 10 years, you no longer need Pap tests. If you have had past treatment for cervical cancer or a condition that could lead to cancer, you need Pap tests and screening for cancer for at least 20 years after your treatment. If Pap tests have been discontinued, risk factors (such as a new sexual partner) need to be reassessed to determine if screening should be resumed. Some women have medical problems that increase the chance of getting cervical cancer. In these cases, your caregiver may recommend more frequent screening and Pap tests.  The human papillomavirus (HPV) test is an additional test that may be used for cervical cancer screening. The HPV test looks for the virus that can cause the cell changes on the cervix. The cells collected during the Pap  test can be tested for HPV. The HPV test could be used to screen women aged 1 years and older, and should be used in women of any age who have unclear Pap test results. After the age of 43, women should have HPV testing at the same frequency as a Pap test.  Colorectal cancer can be detected and often prevented. Most routine colorectal cancer screening begins at the age of 40 and continues through age 11. However, your caregiver may recommend screening at an earlier age if you have risk factors for colon cancer. On a yearly basis, your caregiver may provide home test kits to check for hidden blood in the stool. Use of a small camera at the end of a tube, to directly examine the colon (sigmoidoscopy or colonoscopy), can detect the earliest forms of colorectal cancer. Talk to your caregiver about this at age 42, when routine screening begins. Direct examination of the colon should be  repeated every 5 to 10 years through age 35, unless early forms of pre-cancerous polyps or small growths are found.  Hepatitis C blood testing is recommended for all people born from [birth year] through 1967 and any individual with known risks for hepatitis C.  Practice safe sex. Use condoms and avoid high-risk sexual practices to reduce the spread of sexually transmitted infections (STIs). Sexually active women aged 55 and younger should be checked for Chlamydia, which is a common sexually transmitted infection. Older women with new or multiple partners should also be tested for Chlamydia. Testing for other STIs is recommended if you are sexually active and at increased risk.  Osteoporosis is a disease in which the bones lose minerals and strength with aging. This can result in serious bone fractures. The risk of osteoporosis can be identified using a bone density scan. Women ages 59 and over and women at risk for fractures or osteoporosis should discuss screening with their caregivers. Ask your caregiver whether you should be taking a calcium supplement or vitamin D to reduce the rate of osteoporosis.  Menopause can be associated with physical symptoms and risks. Hormone replacement therapy is available to decrease symptoms and risks. You should talk to your caregiver about whether hormone replacement therapy is right for you.  Use sunscreen. Apply sunscreen liberally and repeatedly throughout the day. You should seek shade when your shadow is shorter than you. Protect yourself by wearing long sleeves, pants, a wide-brimmed hat, and sunglasses year round, whenever you are outdoors.  Notify your caregiver of new moles or changes in moles, especially if there is a change in shape or color. Also notify your caregiver if a mole is larger than the size of a pencil eraser.  Stay current with your immunizations. Document Released: 04/26/2011 Document Revised: 02/05/2013 Document Reviewed: 04/26/2011 [facility]  Patient Information 2014 [place], [place].

## 2013-10-01 ENCOUNTER — Ambulatory Visit: Payer: BC Managed Care – PPO

## 2013-10-03 ENCOUNTER — Ambulatory Visit: Payer: BC Managed Care – PPO

## 2013-10-30 ENCOUNTER — Encounter: Payer: Self-pay | Admitting: Internal Medicine

## 2013-10-30 ENCOUNTER — Ambulatory Visit
Admission: RE | Admit: 2013-10-30 | Discharge: 2013-10-30 | Disposition: A | Discharge status: Home / Self Care | Payer: BC Managed Care – PPO | Source: Ambulatory Visit | Attending: Internal Medicine | Admitting: Internal Medicine

## 2013-10-30 ENCOUNTER — Ambulatory Visit
Admission: RE | Admit: 2013-10-30 | Discharge: 2013-10-30 | Disposition: A | Discharge status: Home / Self Care | Payer: BC Managed Care – PPO | Source: Ambulatory Visit | Attending: Oncology | Admitting: Oncology

## 2013-10-30 DIAGNOSIS — N951 Menopausal and female climacteric states: Secondary | ICD-10-CM

## 2013-10-30 DIAGNOSIS — Z853 Personal history of malignant neoplasm of breast: Secondary | ICD-10-CM

## 2013-10-30 DIAGNOSIS — Z1231 Encounter for screening mammogram for malignant neoplasm of breast: Secondary | ICD-10-CM

## 2013-10-30 LAB — HM DEXA SCAN

## 2013-11-02 ENCOUNTER — Other Ambulatory Visit: Payer: Self-pay | Admitting: Oncology

## 2013-11-02 DIAGNOSIS — R928 Other abnormal and inconclusive findings on diagnostic imaging of breast: Secondary | ICD-10-CM

## 2013-11-03 ENCOUNTER — Encounter: Payer: Self-pay | Admitting: Internal Medicine

## 2013-11-06 ENCOUNTER — Telehealth: Payer: Self-pay | Admitting: *Deleted

## 2013-11-06 NOTE — Telephone Encounter (Signed)
MD want me to call pt inform her that her bone density  that was done on 10/30/13 was normal. No treatment changes recommended. Called pt no answer LMOM RTC...Brittany [name]

## 2013-11-09 ENCOUNTER — Ambulatory Visit
Admission: RE | Admit: 2013-11-09 | Discharge: 2013-11-09 | Disposition: A | Discharge status: Home / Self Care | Payer: BC Managed Care – PPO | Source: Ambulatory Visit | Attending: Oncology | Admitting: Oncology

## 2013-11-09 DIAGNOSIS — R928 Other abnormal and inconclusive findings on diagnostic imaging of breast: Secondary | ICD-10-CM

## 2013-12-10 ENCOUNTER — Other Ambulatory Visit (HOSPITAL_BASED_OUTPATIENT_CLINIC_OR_DEPARTMENT_OTHER): Payer: BC Managed Care – PPO

## 2013-12-10 DIAGNOSIS — D72819 Decreased white blood cell count, unspecified: Secondary | ICD-10-CM

## 2013-12-10 LAB — CBC WITH DIFFERENTIAL/PLATELET
BASO%: 0.8 % (ref 0.0–2.0)
Basophils Absolute: 0 10*3/uL (ref 0.0–0.1)
EOS%: 3.8 % (ref 0.0–7.0)
Eosinophils Absolute: 0.1 10*3/uL (ref 0.0–0.5)
HCT: 37.4 % (ref 34.8–46.6)
HGB: 12.6 g/dL (ref 11.6–15.9)
LYMPH%: 41.6 % (ref 14.0–49.7)
MCH: 31.3 pg (ref 25.1–34.0)
MCHC: 33.7 g/dL (ref 31.5–36.0)
MCV: 92.8 fL (ref 79.5–101.0)
MONO#: 0.2 10*3/uL (ref 0.1–0.9)
MONO%: 8 % (ref 0.0–14.0)
NEUT#: 1.2 10*3/uL — ABNORMAL LOW (ref 1.5–6.5)
NEUT%: 45.8 % (ref 38.4–76.8)
Platelets: 198 10*3/uL (ref 145–400)
RBC: 4.03 10*6/uL (ref 3.70–5.45)
RDW: 12.2 % (ref 11.2–14.5)
WBC: 2.6 10*3/uL — ABNORMAL LOW (ref 3.9–10.3)
lymph#: 1.1 10*3/uL (ref 0.9–3.3)

## 2013-12-10 LAB — COMPREHENSIVE METABOLIC PANEL (CC13)
ALT: 27 U/L (ref 0–55)
AST: 22 U/L (ref 5–34)
Albumin: 3.9 g/dL (ref 3.5–5.0)
Alkaline Phosphatase: 54 U/L (ref 40–150)
Anion Gap: 8 mEq/L (ref 3–11)
BUN: 17.5 mg/dL (ref 7.0–26.0)
CO2: 28 mEq/L (ref 22–29)
Calcium: 9.6 mg/dL (ref 8.4–10.4)
Chloride: 103 mEq/L (ref 98–109)
Creatinine: 0.9 mg/dL (ref 0.6–1.1)
Glucose: 81 mg/dl (ref 70–140)
Potassium: 4.5 mEq/L (ref 3.5–5.1)
Sodium: 139 mEq/L (ref 136–145)
Total Bilirubin: 0.41 mg/dL (ref 0.20–1.20)
Total Protein: 6.7 g/dL (ref 6.4–8.3)

## 2013-12-10 LAB — MORPHOLOGY
PLT EST: ADEQUATE
RBC Comments: NORMAL

## 2013-12-10 LAB — CHCC SMEAR

## 2013-12-10 LAB — LACTATE DEHYDROGENASE (CC13): LDH: 193 U/L (ref 125–245)

## 2013-12-17 ENCOUNTER — Ambulatory Visit (HOSPITAL_BASED_OUTPATIENT_CLINIC_OR_DEPARTMENT_OTHER): Payer: BC Managed Care – PPO | Admitting: Oncology

## 2013-12-17 VITALS — BP 115/73 | HR 67 | Temp 97.9°F | Resp 20 | Ht 68.0 in | Wt 153.5 lb

## 2013-12-17 DIAGNOSIS — Z853 Personal history of malignant neoplasm of breast: Secondary | ICD-10-CM

## 2013-12-17 DIAGNOSIS — D72819 Decreased white blood cell count, unspecified: Secondary | ICD-10-CM

## 2013-12-18 NOTE — Progress Notes (Signed)
Hematology and Oncology Follow Up Visit  Brittany [name] [id number] [birth year] 58 y.o. 12/18/2013 9:20 AM   Principle Diagnosis: Encounter Diagnoses  Name Primary?  . Leukopenia Yes  . BREAST CANCER, HX OF      Interim History:  58 year old woman I follow for stage II, one node positive,  ER positive,  breast cancer diagnosed back in 1996. She underwent lumpectomy, 4 cycles of Adriamycin Cytoxan, and then hormonal therapy. She has been in a continuous remission now for 19 years.  Recent routine mammogram done 10/30/2013 suggested a new problem in the left breast. She was brought back for compression views and ultrasound and findings were unchanged from prior studies with no suspicious lesions. She has developed leukopenia without anemia or thrombocytopenia which has fluctuated over the last few years since approximately 2011 with white count  recorded as low as 2800. She has had a normal differential count until now. Slight shift to the right in the  lymphocytes on today's differential with total white count 2600, 46% neutrophils, 42% neutrophils, 8 monocytes, 4 eosinophils, hemoglobin 12.6, MCV 93, platelets 198,000. Rheumatoid factor checked last year in August 2014 due to her family history of rheumatoid arthritis in her mother was undetectable. ANA negative. She is on no obvious offending medications. I feel that the leukopenia may be a footprint on the bone marrow related to  her prior chemotherapy. She does not get frequent infections. She has not even had a cold this year. She is using some zinc supplements.  Medications: reviewed  Allergies: No Known Allergies  Review of Systems: Hematology:  No bleeding or bruising ENT ROS: She has had some intermittent small painful ulcers in her mouth.  Breast ROS: See above Respiratory ROS: No cough or dyspnea Cardiovascular ROS: No chest pain or palpitations  Gastrointestinal ROS:  No abdominal pain or change in bowel habit  Genito-Urinary  ROS: She is current with her annual GYN exams with Dr. Valentino Saxon Musculoskeletal ROS: No new bone pain Neurological ROS: No headache or change in vision Dermatological ROS: No rash or ecchymosis Remaining ROS negative:   Physical Exam: Blood pressure 115/73, pulse 67, temperature 97.9 F (36.6 C), temperature source Oral, resp. rate 20, height '5\' 8"'  (1.727 m), weight 153 lb 8 oz (69.627 kg). Wt Readings from Last 3 Encounters:  12/17/13 153 lb 8 oz (69.627 kg)  09/14/13 151 lb 9.6 oz (68.765 kg)  12/08/12 156 lb 3.2 oz (70.852 kg)     General appearance: Well-nourished Caucasian woman HENNT: Pharynx no erythema, exudate, mass, or ulcer. No thyromegaly or thyroid nodules Lymph nodes: No cervical, supraclavicular, or axillary lymphadenopathy Breasts: No abnormal skin changes, no dominant mass in either breast; surgical changes left breast Lungs: Clear to auscultation, resonant to percussion throughout Heart: Regular rhythm, no murmur, no gallop, no rub, no click, no edema Abdomen: Soft, nontender, normal bowel sounds, no mass, no organomegaly Extremities: No edema, no calf tenderness Musculoskeletal: no joint deformities GU:  Vascular: Carotid pulses 2+, no bruits,  Neurologic: Alert, oriented, PERRLA, , cranial nerves grossly normal, motor strength 5 over 5, reflexes 1+ symmetric, upper body coordination normal, gait normal, Skin: No rash or ecchymosis  Lab Results: CBC W/Diff    Component Value Date/Time   WBC 2.6* 12/10/2013 0841   WBC 2.8* 09/12/2013 1304   RBC 4.03 12/10/2013 0841   RBC 4.04 09/12/2013 1304   HGB 12.6 12/10/2013 0841   HGB 13.1 09/12/2013 1304   HCT 37.4 12/10/2013 0841   HCT 38.3  09/12/2013 1304   PLT 198 12/10/2013 0841   PLT 190.0 09/12/2013 1304   MCV 92.8 12/10/2013 0841   MCV 94.8 09/12/2013 1304   MCH 31.3 12/10/2013 0841   MCHC 33.7 12/10/2013 0841   MCHC 34.1 09/12/2013 1304   RDW 12.2 12/10/2013 0841   RDW 12.8 09/12/2013 1304   LYMPHSABS 1.1  12/10/2013 0841   LYMPHSABS 1.0 09/12/2013 1304   MONOABS 0.2 12/10/2013 0841   MONOABS 0.3 09/12/2013 1304   EOSABS 0.1 12/10/2013 0841   EOSABS 0.0 09/12/2013 1304   BASOSABS 0.0 12/10/2013 0841   BASOSABS 0.0 09/12/2013 1304     Chemistry      Component Value Date/Time   NA 139 12/10/2013 0841   NA 137 09/12/2013 1304   K 4.5 12/10/2013 0841   K 4.6 09/12/2013 1304   CL 101 09/12/2013 1304   CL 102 12/01/2012 0955   CO2 28 12/10/2013 0841   CO2 29 09/12/2013 1304   BUN 17.5 12/10/2013 0841   BUN 14 09/12/2013 1304   CREATININE 0.9 12/10/2013 0841   CREATININE 0.8 09/12/2013 1304      Component Value Date/Time   CALCIUM 9.6 12/10/2013 0841   CALCIUM 9.5 09/12/2013 1304   ALKPHOS 54 12/10/2013 0841   ALKPHOS 47 09/12/2013 1304   AST 22 12/10/2013 0841   AST 30 09/12/2013 1304   ALT 27 12/10/2013 0841   ALT 27 09/12/2013 1304   BILITOT 0.41 12/10/2013 0841   BILITOT 0.9 09/12/2013 1304       Radiological Studies: See discussion above    Impression:   #1. Stage II, 1 node positive, ER positive, breast cancer treated as outlined above. No evidence for recurrence now out 19 years. She has an excellent Primary care physician. I gave her the option of graduating from our practice. If she has problems in the future we would be happy to see her again. One of her friends is seeing Dr. Humphrey Rolls and is very satisfied with her care and I would refer her to Dr. Humphrey Rolls if needed.  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 No indication for further evaluation such as a bone marrow biopsy unless there are any progressive changes.  #3. 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 Pamala Hurry, MD as Consulting Physician (Obstetrics and Gynecology) Lavena Stanford, MD (Dermatology) Mauri Pole, MD  (Orthopedic Surgery)   Annia Belt, MD 2/24/20159:20 AM

## 2014-08-08 ENCOUNTER — Telehealth: Payer: Self-pay | Admitting: Internal Medicine

## 2014-08-08 NOTE — Telephone Encounter (Signed)
Patient Information:  Caller Name: Khalila  Phone: [phone]  Patient: Brittany [name], Brittany [name]  Gender: Female  DOB: 11-26-1955  Age: 58 Years  PCP: Gwendolyn Grant (Adults only)  Office Follow Up:  Does the office need to follow up with this patient?: No  Instructions For The Office: N/A  RN Note:  Patient calling regarding cough which she developed while on a recent trip to [place].  Symptoms originally included sore throat, hoarsness and non productive cough. All symptoms have resolved except for cough which now has become productive.  Color clear to yellow.  Took Delsym last PM and obtain relief. Denies blood in sputum or any chest pain. All triage negative  Symptoms  Reason For Call & Symptoms: cough  Reviewed Health History In EMR: Yes  Reviewed Medications In EMR: Yes  Reviewed Allergies In EMR: Yes  Reviewed Surgeries / Procedures: Yes  Date of Onset of Symptoms: 08/03/2014  Treatments Tried: delsym  Treatments Tried Worked: Yes  Guideline(s) Used:  Cough  Disposition Per Guideline:   Home Care  Reason For Disposition Reached:   Cough with no complications  Advice Given:  Reassurance  Coughing is the way that our lungs remove irritants and mucus. It helps protect our lungs from getting pneumonia.  Cough Medicines:  OTC Cough Drops: Cough drops can help a lot, especially for mild coughs. They reduce coughing by soothing your irritated throat and removing that tickle sensation in the back of the throat. Cough drops also have the advantage of portability - you can carry them with you.  Home Remedy - Hard Candy: Hard candy works just as well as medicine-flavored OTC cough drops. Diabetics should use sugar-free candy.  Home Remedy - Honey: This old home remedy has been shown to help decrease coughing at night. The adult dosage is 2 teaspoons (10 ml) at bedtime. Honey should not be given to infants under one year of age.  OTC Cough Syrup - Dextromethorphan:  Cough syrups  containing the cough suppressant dextromethorphan (DM) may help decrease your cough. Cough syrups work best for coughs that keep you awake at night. They can also sometimes help in the late stages of a respiratory infection when the cough is dry and hacking. They can be used along with cough drops.  Examples: Benylin, Robitussin DM, Vicks 44 Cough Relief  Read the package instructions for dosage, contraindications, and other important information.  Coughing Spasms:  Drink warm fluids. Inhale warm mist (Reason: both relax the airway and loosen up the phlegm).  Suck on cough drops or hard candy to coat the irritated throat.  Prevent Dehydration:  Drink adequate liquids.  This will help soothe an irritated or dry throat and loosen up the phlegm.  Call Back If:  Difficulty breathing  Cough lasts more than 3 weeks  You become worse.  Patient Will Follow Care Advice:  YES

## 2014-09-16 ENCOUNTER — Ambulatory Visit (INDEPENDENT_AMBULATORY_CARE_PROVIDER_SITE_OTHER): Payer: BC Managed Care – PPO | Admitting: Internal Medicine

## 2014-09-16 ENCOUNTER — Encounter: Payer: Self-pay | Admitting: Internal Medicine

## 2014-09-16 VITALS — BP 136/84 | HR 60 | Temp 98.7°F | Ht 68.0 in | Wt 153.0 lb

## 2014-09-16 DIAGNOSIS — Z23 Encounter for immunization: Secondary | ICD-10-CM

## 2014-09-16 DIAGNOSIS — Z0000 Encounter for general adult medical examination without abnormal findings: Secondary | ICD-10-CM

## 2014-09-16 NOTE — Patient Instructions (Addendum)
It was good to see you today.  We have reviewed your prior records including labs and tests today  Health Maintenance reviewed - Zostavax (Shingles) and Tdap given today - all other recommended immunizations and age-appropriate screenings are up-to-date.  Test(s) ordered today. Return wen you are fasting. Your results will be released to Brittany [name] (or called to you) after review, usually within 72hours after test completion. If any changes need to be made, you will be notified at that same time.  Medications reviewed and updated, no changes recommended at this time.  Please schedule followup in 12 months for annual exam and labs, call sooner if problems.  Health Maintenance Adopting a healthy lifestyle and getting preventive care can go a long way to promote health and wellness. Talk with your health care provider about what schedule of regular examinations is right for you. This is a good chance for you to check in with your provider about disease prevention and staying healthy. In between checkups, there are plenty of things you can do on your own. Experts have done a lot of research about which lifestyle changes and preventive measures are most likely to keep you healthy. Ask your health care provider for more information. WEIGHT AND DIET  Eat a healthy diet  Be sure to include plenty of vegetables, fruits, low-fat dairy products, and lean protein.  Do not eat a lot of foods high in solid fats, added sugars, or salt.  Get regular exercise. This is one of the most important things you can do for your health.  Most adults should exercise for at least 150 minutes each week. The exercise should increase your heart rate and make you sweat (moderate-intensity exercise).  Most adults should also do strengthening exercises at least twice a week. This is in addition to the moderate-intensity exercise.  Maintain a healthy weight  Body mass index (BMI) is a measurement that can be used to  identify possible weight problems. It estimates body fat based on height and weight. Your health care provider can help determine your BMI and help you achieve or maintain a healthy weight.  For females 19 years of age and older:   A BMI below 18.5 is considered underweight.  A BMI of 18.5 to 24.9 is normal.  A BMI of 25 to 29.9 is considered overweight.  A BMI of 30 and above is considered obese.  Watch levels of cholesterol and blood lipids  You should start having your blood tested for lipids and cholesterol at 60 years of age, then have this test every 5 years.  You may need to have your cholesterol levels checked more often if:  Your lipid or cholesterol levels are high.  You are older than 60 years of age.  You are at high risk for heart disease.  CANCER SCREENING   Lung Cancer  Lung cancer screening is recommended for adults 35-72 years old who are at high risk for lung cancer because of a history of smoking.  A yearly low-dose CT scan of the lungs is recommended for people who:  Currently smoke.  Have quit within the past 15 years.  Have at least a 30-pack-year history of smoking. A pack year is smoking an average of one pack of cigarettes a day for 1 year.  Yearly screening should continue until it has been 15 years since you quit.  Yearly screening should stop if you develop a health problem that would prevent you from having lung cancer treatment.  Breast Cancer  Practice breast self-awareness. This means understanding how your breasts normally appear and feel.  It also means doing regular breast self-exams. Let your health care provider know about any changes, no matter how small.  If you are in your 20s or 30s, you should have a clinical breast exam (CBE) by a health care provider every 1-3 years as part of a regular health exam.  If you are 31 or older, have a CBE every year. Also consider having a breast X-ray (mammogram) every year.  If you have a  family history of breast cancer, talk to your health care provider about genetic screening.  If you are at high risk for breast cancer, talk to your health care provider about having an MRI and a mammogram every year.  Breast cancer gene (BRCA) assessment is recommended for women who have family members with BRCA-related cancers. BRCA-related cancers include:  Breast.  Ovarian.  Tubal.  Peritoneal cancers.  Results of the assessment will determine the need for genetic counseling and BRCA1 and BRCA2 testing. Cervical Cancer Routine pelvic examinations to screen for cervical cancer are no longer recommended for nonpregnant women who are considered low risk for cancer of the pelvic organs (ovaries, uterus, and vagina) and who do not have symptoms. A pelvic examination may be necessary if you have symptoms including those associated with pelvic infections. Ask your health care provider if a screening pelvic exam is right for you.   The Pap test is the screening test for cervical cancer for women who are considered at risk.  If you had a hysterectomy for a problem that was not cancer or a condition that could lead to cancer, then you no longer need Pap tests.  If you are older than 65 years, and you have had normal Pap tests for the past 10 years, you no longer need to have Pap tests.  If you have had past treatment for cervical cancer or a condition that could lead to cancer, you need Pap tests and screening for cancer for at least 20 years after your treatment.  If you no longer get a Pap test, assess your risk factors if they change (such as having a new sexual partner). This can affect whether you should start being screened again.  Some women have medical problems that increase their chance of getting cervical cancer. If this is the case for you, your health care provider may recommend more frequent screening and Pap tests.  The human papillomavirus (HPV) test is another test that may  be used for cervical cancer screening. The HPV test looks for the virus that can cause cell changes in the cervix. The cells collected during the Pap test can be tested for HPV.  The HPV test can be used to screen women 49 years of age and older. Getting tested for HPV can extend the interval between normal Pap tests from three to five years.  An HPV test also should be used to screen women of any age who have unclear Pap test results.  After 60 years of age, women should have HPV testing as often as Pap tests.  Colorectal Cancer  This type of cancer can be detected and often prevented.  Routine colorectal cancer screening usually begins at 60 years of age and continues through 60 years of age.  Your health care provider may recommend screening at an earlier age if you have risk factors for colon cancer.  Your health care provider may also recommend using home test kits  to check for hidden blood in the stool.  A small camera at the end of a tube can be used to examine your colon directly (sigmoidoscopy or colonoscopy). This is done to check for the earliest forms of colorectal cancer.  Routine screening usually begins at age 6.  Direct examination of the colon should be repeated every 5-10 years through 60 years of age. However, you may need to be screened more often if early forms of precancerous polyps or small growths are found. Skin Cancer  Check your skin from head to toe regularly.  Tell your health care provider about any new moles or changes in moles, especially if there is a change in a mole's shape or color.  Also tell your health care provider if you have a mole that is larger than the size of a pencil eraser.  Always use sunscreen. Apply sunscreen liberally and repeatedly throughout the day.  Protect yourself by wearing long sleeves, pants, a wide-brimmed hat, and sunglasses whenever you are outside. HEART DISEASE, DIABETES, AND HIGH BLOOD PRESSURE   Have your blood  pressure checked at least every 1-2 years. High blood pressure causes heart disease and increases the risk of stroke.  If you are between 27 years and 82 years old, ask your health care provider if you should take aspirin to prevent strokes.  Have regular diabetes screenings. This involves taking a blood sample to check your fasting blood sugar level.  If you are at a normal weight and have a low risk for diabetes, have this test once every three years after 60 years of age.  If you are overweight and have a high risk for diabetes, consider being tested at a younger age or more often. PREVENTING INFECTION  Hepatitis B  If you have a higher risk for hepatitis B, you should be screened for this virus. You are considered at high risk for hepatitis B if:  You were born in a country where hepatitis B is common. Ask your health care provider which countries are considered high risk.  Your parents were born in a high-risk country, and you have not been immunized against hepatitis B (hepatitis B vaccine).  You have HIV or AIDS.  You use needles to inject street drugs.  You live with someone who has hepatitis B.  You have had sex with someone who has hepatitis B.  You get hemodialysis treatment.  You take certain medicines for conditions, including cancer, organ transplantation, and autoimmune conditions. Hepatitis C  Blood testing is recommended for:  Everyone born from [birth year] through 1967.  Anyone with known risk factors for hepatitis C. Sexually transmitted infections (STIs)  You should be screened for sexually transmitted infections (STIs) including gonorrhea and chlamydia if:  You are sexually active and are younger than 60 years of age.  You are older than 61 years of age and your health care provider tells you that you are at risk for this type of infection.  Your sexual activity has changed since you were last screened and you are at an increased risk for chlamydia or  gonorrhea. Ask your health care provider if you are at risk.  If you do not have HIV, but are at risk, it may be recommended that you take a prescription medicine daily to prevent HIV infection. This is called pre-exposure prophylaxis (PrEP). You are considered at risk if:  You are sexually active and do not regularly use condoms or know the HIV status of your partner(s).  You  take drugs by injection.  You are sexually active with a partner who has HIV. Talk with your health care provider about whether you are at high risk of being infected with HIV. If you choose to begin PrEP, you should first be tested for HIV. You should then be tested every 3 months for as long as you are taking PrEP.  PREGNANCY   If you are premenopausal and you may become pregnant, ask your health care provider about preconception counseling.  If you may become pregnant, take 400 to 800 micrograms (mcg) of folic acid every day.  If you want to prevent pregnancy, talk to your health care provider about birth control (contraception). OSTEOPOROSIS AND MENOPAUSE   Osteoporosis is a disease in which the bones lose minerals and strength with aging. This can result in serious bone fractures. Your risk for osteoporosis can be identified using a bone density scan.  If you are 17 years of age or older, or if you are at risk for osteoporosis and fractures, ask your health care provider if you should be screened.  Ask your health care provider whether you should take a calcium or vitamin D supplement to lower your risk for osteoporosis.  Menopause may have certain physical symptoms and risks.  Hormone replacement therapy may reduce some of these symptoms and risks. Talk to your health care provider about whether hormone replacement therapy is right for you.  HOME CARE INSTRUCTIONS   Schedule regular health, dental, and eye exams.  Stay current with your immunizations.   Do not use any tobacco products including  cigarettes, chewing tobacco, or electronic cigarettes.  If you are pregnant, do not drink alcohol.  If you are breastfeeding, limit how much and how often you drink alcohol.  Limit alcohol intake to no more than 1 drink per day for nonpregnant women. One drink equals 12 ounces of beer, 5 ounces of wine, or 1 ounces of hard liquor.  Do not use street drugs.  Do not share needles.  Ask your health care provider for help if you need support or information about quitting drugs.  Tell your health care provider if you often feel depressed.  Tell your health care provider if you have ever been abused or do not feel safe at home. Document Released: 04/26/2011 Document Revised: 02/25/2014 Document Reviewed: 09/12/2013 [facility] Patient Information 2015 [place], [place]. This information is not intended to replace advice given to you by your health care provider. Make sure you discuss any questions you have with your health care provider.

## 2014-09-16 NOTE — Progress Notes (Signed)
Subjective:    Patient ID: Brittany [name], female    DOB: May 05, 1956, 58 y.o.   MRN: [id number]  HPI  patient is here today for annual physical. Patient feels well and has no complaints.  Also reviewed chronic medical issues and interval medical events  Past Medical History  Diagnosis Date  . ANEMIA-NOS   . Asymptomatic varicose veins   . BREAST CANCER, HX OF 01/1995 dx    s/p Lumpectomy, XRT, chemo and 37yr armidex  . OSTEOARTHRITIS   . SCOLIOSIS, MILD   . Leukopenia     mild since chemo  . Gestational diabetes mellitus in childbirth, diet controlled 1985   Family History  Problem Relation Age of Onset  . Arthritis Mother   . Breast cancer Mother 40  . Hyperlipidemia Mother   . Heart disease Father 58    AMI  . Hyperlipidemia Father   . Valvular heart disease Father 32  . Parkinson's disease Father 61  . Diabetes Paternal Grandfather     type 2  . Pancreatic cancer Maternal Grandmother 73    presumed dx   History  Substance Use Topics  . Smoking status: Former Smoker    Quit date: 06/25/1977  . Smokeless tobacco: Not on file  . Alcohol Use: No   Review of Systems  Constitutional: Negative for fatigue and unexpected weight change.  Respiratory: Negative for cough, shortness of breath and wheezing.   Cardiovascular: Negative for chest pain, palpitations and leg swelling.  Gastrointestinal: Negative for nausea, abdominal pain and diarrhea.  Musculoskeletal: Positive for arthralgias (B shoulders, mild - does not interfere with ADLs).  Neurological: Negative for dizziness, weakness, light-headedness and headaches.  Psychiatric/Behavioral: Negative for dysphoric mood. The patient is not nervous/anxious.   All other systems reviewed and are negative.      Objective:   Physical Exam  BP 136/84 mmHg  Pulse 60  Temp(Src) 98.7 F (37.1 C) (Oral)  Ht 5\' 8"  (1.727 m)  Wt 153 lb (69.4 kg)  BMI 23.27 kg/m2  SpO2 97% Wt Readings from Last 3 Encounters:  09/16/14  153 lb (69.4 kg)  12/17/13 153 lb 8 oz (69.627 kg)  09/14/13 151 lb 9.6 oz (68.765 kg)   Constitutional: She appears well-developed and well-nourished. No distress.  HENT: Head: Normocephalic and atraumatic. Ears: B TMs ok, no erythema or effusion; Nose: Nose normal. Mouth/Throat: Oropharynx is clear and moist. No oropharyngeal exudate.  Eyes: Conjunctivae and EOM are normal. Pupils are equal, round, and reactive to light. No scleral icterus.  Neck: Normal range of motion. Neck supple. No JVD present. No thyromegaly present.  Cardiovascular: Normal rate, regular rhythm and normal heart sounds.  No murmur heard. No BLE edema. Pulmonary/Chest: Effort normal and breath sounds normal. No respiratory distress. She has no wheezes.  Abdominal: Soft. Bowel sounds are normal. She exhibits no distension. There is no tenderness. no masses GU/breast: defer to gyn Musculoskeletal: Normal range of motion, no joint effusions. No gross deformities Neurological: She is alert and oriented to person, place, and time. No cranial nerve deficit. Coordination, balance, strength, speech and gait are normal.  Skin: Skin is warm and dry. No rash noted. No erythema.  Psychiatric: She has a normal mood and affect. Her behavior is normal. Judgment and thought content normal.    Lab Results  Component Value Date   WBC 2.6* 12/10/2013   HGB 12.6 12/10/2013   HCT 37.4 12/10/2013   PLT 198 12/10/2013   GLUCOSE 81 12/10/2013  CHOL 182 09/12/2013   TRIG 48.0 09/12/2013   HDL 78.80 09/12/2013   LDLCALC 94 09/12/2013   ALT 27 12/10/2013   AST 22 12/10/2013   NA 139 12/10/2013   K 4.5 12/10/2013   CL 101 09/12/2013   CREATININE 0.9 12/10/2013   BUN 17.5 12/10/2013   CO2 28 12/10/2013   TSH 4.87 09/12/2013    Mm Digital Diagnostic Unilat L  11/09/2013   CLINICAL DATA:  Patient was recalled from screening for possible asymmetry at the lumpectomy site within the left breast.  EXAM: DIGITAL DIAGNOSTIC  LEFT  MAMMOGRAM WITH CAD  ULTRASOUND LEFT BREAST  COMPARISON:  [facility]  ACR Breast Density Category b: There are scattered areas of fibroglandular density.  FINDINGS: Spot compression CC view of the lumpectomy site demonstrates stable appearing postsurgical change dating back to 2008. No concerning masses identified at the lumpectomy site.  Mammographic images were processed with CAD.  On physical exam, I palpate no discrete mass within the upper-outer quadrant of the left breast. Postsurgical scarring is demonstrated within the upper-outer quadrant of the left breast.  Ultrasound is performed, showing scar tissue within the upper-outer quadrant of the left breast. No adjacent or concerning mass is identified.  IMPRESSION: Stable postsurgical scarring upper-outer quadrant left breast.  No mammographic evidence for malignancy.  RECOMMENDATION: Screening mammogram in one year.(Code:SM-B-01Y)  I have discussed the findings and recommendations with the patient. Results were also provided in writing at the conclusion of the visit.  BI-RADS CATEGORY  1: Negative   Electronically Signed   By: Lovey Newcomer M.D.   On: 11/09/2013 16:29   US Breast Ltd Uni Left Inc Axilla  11/09/2013   CLINICAL DATA:  Patient was recalled from screening for possible asymmetry at the lumpectomy site within the left breast.  EXAM: DIGITAL DIAGNOSTIC  LEFT MAMMOGRAM WITH CAD  ULTRASOUND LEFT BREAST  COMPARISON:  [facility]  ACR Breast Density Category b: There are scattered areas of fibroglandular density.  FINDINGS: Spot compression CC view of the lumpectomy site demonstrates stable appearing postsurgical change dating back to 2008. No concerning masses identified at the lumpectomy site.  Mammographic images were processed with CAD.  On physical exam, I palpate no discrete mass within the upper-outer quadrant of the left breast. Postsurgical scarring is demonstrated within the upper-outer quadrant of the left breast.  Ultrasound is performed, showing  scar tissue within the upper-outer quadrant of the left breast. No adjacent or concerning mass is identified.  IMPRESSION: Stable postsurgical scarring upper-outer quadrant left breast.  No mammographic evidence for malignancy.  RECOMMENDATION: Screening mammogram in one year.(Code:SM-B-01Y)  I have discussed the findings and recommendations with the patient. Results were also provided in writing at the conclusion of the visit.  BI-RADS CATEGORY  1: Negative   Electronically Signed   By: Lovey Newcomer M.D.   On: 11/09/2013 16:29       Assessment & Plan:   CPX/z00.00 - Patient has been counseled on age-appropriate routine health concerns for screening and prevention. These are reviewed and up-to-date. Immunizations are up-to-date or declined. Labs ordered and reviewed.

## 2014-09-16 NOTE — Progress Notes (Signed)
Pre visit review using our clinic review tool, if applicable. No additional management support is needed unless otherwise documented below in the visit note. 

## 2014-09-17 ENCOUNTER — Other Ambulatory Visit (INDEPENDENT_AMBULATORY_CARE_PROVIDER_SITE_OTHER): Payer: BC Managed Care – PPO

## 2014-09-17 DIAGNOSIS — Z0000 Encounter for general adult medical examination without abnormal findings: Secondary | ICD-10-CM

## 2014-09-17 LAB — URINALYSIS, ROUTINE W REFLEX MICROSCOPIC
Bilirubin Urine: NEGATIVE
Hgb urine dipstick: NEGATIVE
Ketones, ur: NEGATIVE
Leukocytes, UA: NEGATIVE
Nitrite: NEGATIVE
RBC / HPF: NONE SEEN (ref 0–?)
Specific Gravity, Urine: 1.015 (ref 1.000–1.030)
Total Protein, Urine: NEGATIVE
Urine Glucose: NEGATIVE
Urobilinogen, UA: 0.2 (ref 0.0–1.0)
WBC, UA: NONE SEEN (ref 0–?)
pH: 7.5 (ref 5.0–8.0)

## 2014-09-17 LAB — LIPID PANEL
Cholesterol: 182 mg/dL (ref 0–200)
HDL: 81.6 mg/dL (ref >39.00)
LDL Cholesterol: 91 mg/dL (ref 0–99)
NonHDL: 100.4
Total CHOL/HDL Ratio: 2
Triglycerides: 46 mg/dL (ref 0.0–149.0)
VLDL: 9.2 mg/dL (ref 0.0–40.0)

## 2014-09-17 LAB — CBC WITH DIFFERENTIAL/PLATELET
Basophils Absolute: 0 10*3/uL (ref 0.0–0.1)
Basophils Relative: 0.5 % (ref 0.0–3.0)
Eosinophils Absolute: 0.1 10*3/uL (ref 0.0–0.7)
Eosinophils Relative: 1.8 % (ref 0.0–5.0)
HCT: 38.8 % (ref 36.0–46.0)
Hemoglobin: 12.9 g/dL (ref 12.0–15.0)
Lymphocytes Relative: 30.7 % (ref 12.0–46.0)
Lymphs Abs: 1 10*3/uL (ref 0.7–4.0)
MCHC: 33.1 g/dL (ref 30.0–36.0)
MCV: 95 fl (ref 78.0–100.0)
Monocytes Absolute: 0.2 10*3/uL (ref 0.1–1.0)
Monocytes Relative: 7.1 % (ref 3.0–12.0)
Neutro Abs: 2 10*3/uL (ref 1.4–7.7)
Neutrophils Relative %: 59.9 % (ref 43.0–77.0)
Platelets: 205 10*3/uL (ref 150.0–400.0)
RBC: 4.09 Mil/uL (ref 3.87–5.11)
RDW: 12.6 % (ref 11.5–15.5)
WBC: 3.4 10*3/uL — ABNORMAL LOW (ref 4.0–10.5)

## 2014-09-17 LAB — BASIC METABOLIC PANEL
BUN: 16 mg/dL (ref 6–23)
CO2: 28 mEq/L (ref 19–32)
Calcium: 9.3 mg/dL (ref 8.4–10.5)
Chloride: 102 mEq/L (ref 96–112)
Creatinine, Ser: 0.9 mg/dL (ref 0.4–1.2)
GFR: 71.44 mL/min (ref >60.00)
Glucose, Bld: 91 mg/dL (ref 70–99)
Potassium: 5 mEq/L (ref 3.5–5.1)
Sodium: 139 mEq/L (ref 135–145)

## 2014-09-17 LAB — HEPATIC FUNCTION PANEL
ALT: 19 U/L (ref 0–35)
AST: 22 U/L (ref 0–37)
Albumin: 4 g/dL (ref 3.5–5.2)
Alkaline Phosphatase: 54 U/L (ref 39–117)
Bilirubin, Direct: 0.1 mg/dL (ref 0.0–0.3)
Total Bilirubin: 0.7 mg/dL (ref 0.2–1.2)
Total Protein: 6.8 g/dL (ref 6.0–8.3)

## 2014-09-17 LAB — TSH: TSH: 4.42 u[IU]/mL (ref 0.35–4.50)

## 2014-10-11 ENCOUNTER — Other Ambulatory Visit: Payer: Self-pay

## 2014-10-11 DIAGNOSIS — Z1231 Encounter for screening mammogram for malignant neoplasm of breast: Secondary | ICD-10-CM

## 2014-11-04 ENCOUNTER — Ambulatory Visit
Admission: RE | Admit: 2014-11-04 | Discharge: 2014-11-04 | Disposition: A | Discharge status: Home / Self Care | Payer: BLUE CROSS/BLUE SHIELD | Source: Ambulatory Visit

## 2014-11-04 DIAGNOSIS — Z1231 Encounter for screening mammogram for malignant neoplasm of breast: Secondary | ICD-10-CM

## 2014-12-09 LAB — HM COLONOSCOPY

## 2015-01-08 ENCOUNTER — Encounter: Payer: Self-pay | Admitting: Internal Medicine

## 2015-05-12 ENCOUNTER — Encounter: Payer: Self-pay | Admitting: Genetic Counselor

## 2015-08-04 LAB — HEPATIC FUNCTION PANEL
ALT: 23 U/L (ref 7–35)
AST: 22 U/L (ref 13–35)
Alkaline Phosphatase: 58 U/L (ref 25–125)
Bilirubin, Total: 0.5 mg/dL

## 2015-08-04 LAB — HEMOGLOBIN A1C: Hgb A1c MFr Bld: 5.5 % (ref 4.0–6.0)

## 2015-08-04 LAB — LIPID PANEL
Cholesterol: 211 mg/dL — AB (ref 0–200)
HDL: 82 mg/dL — AB (ref 35–70)
LDL Cholesterol: 115 mg/dL
Triglycerides: 65 mg/dL (ref 40–160)

## 2015-08-04 LAB — BASIC METABOLIC PANEL
BUN: 14 mg/dL (ref 4–21)
Creatinine: 0.9 mg/dL (ref 0.5–1.1)
Glucose: 102 mg/dL

## 2015-09-22 ENCOUNTER — Encounter: Payer: Self-pay | Admitting: Internal Medicine

## 2015-09-22 ENCOUNTER — Ambulatory Visit (INDEPENDENT_AMBULATORY_CARE_PROVIDER_SITE_OTHER): Payer: BLUE CROSS/BLUE SHIELD | Admitting: Internal Medicine

## 2015-09-22 VITALS — BP 110/78 | HR 69 | Temp 97.9°F | Ht 68.0 in | Wt 149.0 lb

## 2015-09-22 DIAGNOSIS — Z23 Encounter for immunization: Secondary | ICD-10-CM | POA: Diagnosis not present

## 2015-09-22 DIAGNOSIS — Z1159 Encounter for screening for other viral diseases: Secondary | ICD-10-CM | POA: Diagnosis not present

## 2015-09-22 DIAGNOSIS — D72819 Decreased white blood cell count, unspecified: Secondary | ICD-10-CM

## 2015-09-22 DIAGNOSIS — Z0000 Encounter for general adult medical examination without abnormal findings: Secondary | ICD-10-CM

## 2015-09-22 DIAGNOSIS — M7542 Impingement syndrome of left shoulder: Secondary | ICD-10-CM | POA: Diagnosis not present

## 2015-09-22 NOTE — Progress Notes (Signed)
Subjective:    Patient ID: Brittany [name], female    DOB: [birth year], 59 y.o.   MRN: [id number]  HPI  patient is here today for annual physical. Patient feels well overall Also reviewed chronic medical conditions, interval events and current concerns  Past Medical History  Diagnosis Date  . ANEMIA-NOS   . Asymptomatic varicose veins   . BREAST CANCER, HX OF 01/1995 dx    s/p Lumpectomy, XRT, chemo and 31yr armidex  . OSTEOARTHRITIS   . SCOLIOSIS, MILD   . Leukopenia     mild since chemo  . Gestational diabetes mellitus in childbirth, diet controlled 1985   Family History  Problem Relation Age of Onset  . Arthritis Mother   . Breast cancer Mother 81  . Hyperlipidemia Mother   . Heart disease Father 95    AMI  . Hyperlipidemia Father   . Valvular heart disease Father 1  . Parkinson's disease Father 79  . Diabetes Paternal Grandfather     type 2  . Pancreatic cancer Maternal Grandmother 73    presumed dx   Social History  Substance Use Topics  . Smoking status: Former Smoker    Quit date: 06/25/1977  . Smokeless tobacco: None  . Alcohol Use: No    Review of Systems  Constitutional: Negative for fatigue and unexpected weight change.  Respiratory: Negative for cough, shortness of breath and wheezing.   Cardiovascular: Negative for chest pain, palpitations and leg swelling.  Gastrointestinal: Negative for nausea, abdominal pain and diarrhea.  Musculoskeletal: Positive for arthralgias (L shoulder, no injury - worse with overhead and swimming activity).  Neurological: Negative for dizziness, weakness, light-headedness and headaches.  Psychiatric/Behavioral: Negative for dysphoric mood. The patient is not nervous/anxious.   All other systems reviewed and are negative.      Objective:    Physical Exam  Constitutional: She is oriented to person, place, and time. She appears well-developed and well-nourished. No distress.  HENT:  Head: Normocephalic and atraumatic.   Right Ear: External ear normal.  Left Ear: External ear normal.  Nose: Nose normal.  Mouth/Throat: Oropharynx is clear and moist. No oropharyngeal exudate.  Eyes: EOM are normal. Pupils are equal, round, and reactive to light. Right eye exhibits no discharge. Left eye exhibits no discharge. No scleral icterus.  Neck: Normal range of motion. Neck supple. No JVD present. No tracheal deviation present. No thyromegaly present.  Cardiovascular: Normal rate, regular rhythm, normal heart sounds and intact distal pulses.  Exam reveals no friction rub.   No murmur heard. Pulmonary/Chest: Effort normal and breath sounds normal. No respiratory distress. She has no wheezes. She has no rales. She exhibits no tenderness. Right breast exhibits no inverted nipple, no mass, no skin change and no tenderness. Left breast exhibits no inverted nipple, no mass, no skin change and no tenderness.    Supervised by [facility] CMA  Abdominal: Soft. Bowel sounds are normal. She exhibits no distension and no mass. There is no tenderness. There is no rebound and no guarding.  Genitourinary:  Defer to gyn  Musculoskeletal: Normal range of motion.  No gross deformities L Shoulder: Full range of motion. Neurovascularly intact distally. Good strength with stress of rotator cuff but causes pain. Positive impingement signs.  Lymphadenopathy:    She has no cervical adenopathy.  Neurological: She is alert and oriented to person, place, and time. She has normal reflexes. No cranial nerve deficit.  Skin: Skin is warm and dry. No rash noted. She is not  diaphoretic. No erythema.  Psychiatric: She has a normal mood and affect. Her behavior is normal. Judgment and thought content normal.  Nursing note and vitals reviewed.   BP 110/78 mmHg  Pulse 69  Temp(Src) 97.9 F (36.6 C) (Oral)  Ht 5\' 8"  (1.727 m)  Wt 149 lb (67.586 kg)  BMI 22.66 kg/m2  SpO2 99% Wt Readings from Last 3 Encounters:  09/22/15 149 lb (67.586 kg)    09/16/14 153 lb (69.4 kg)  12/17/13 153 lb 8 oz (69.627 kg)    Lab Results  Component Value Date   WBC 3.4* 09/17/2014   HGB 12.9 09/17/2014   HCT 38.8 09/17/2014   PLT 205.0 09/17/2014   GLUCOSE 91 09/17/2014   CHOL 211* 08/04/2015   TRIG 65 08/04/2015   HDL 82* 08/04/2015   LDLCALC 115 08/04/2015   ALT 23 08/04/2015   AST 22 08/04/2015   NA 139 09/17/2014   K 5.0 09/17/2014   CL 102 09/17/2014   CREATININE 0.9 08/04/2015   BUN 14 08/04/2015   CO2 28 09/17/2014   TSH 4.42 09/17/2014   HGBA1C 5.5 08/04/2015    Mm Screening Breast Tomo Bilateral  11/04/2014  CLINICAL DATA:  Screening. History of left lumpectomy for breast cancer 1996. EXAM: DIGITAL SCREENING BILATERAL MAMMOGRAM WITH 3D TOMO WITH CAD COMPARISON:  Previous exam(s). ACR Breast Density Category b: There are scattered areas of fibroglandular density. FINDINGS: There are no findings suspicious for malignancy. Images were processed with CAD. Left lumpectomy changes are noted. IMPRESSION: No mammographic evidence of malignancy. A result letter of this screening mammogram will be mailed directly to the patient. RECOMMENDATION: Screening mammogram in one year. (Code:SM-B-01Y) BI-RADS CATEGORY  2: Benign. Electronically Signed   By: Conchita Paris M.D.   On: 11/04/2014 15:55       Assessment & Plan:   CPX/z00.00 - Patient has been counseled on age-appropriate routine health concerns for screening and prevention. These are reviewed and up-to-date. Immunizations are up-to-date or declined. Labs ordered and reviewed. Pneumonia vaccine today due to history of breast cancer and leukopenia  Left shoulder impingement. Refer to sports medicine for evaluation and treatment of same  Problem List Items Addressed This Visit    Leukopenia    Chronic, felt related to prior breast cancer therapy in [place] annually to monitor No longer following regularly with hematology/oncology       Other Visit Diagnoses    Routine  general medical examination at a health care facility    -  Primary    Shoulder impingement syndrome, left            Gwendolyn Grant, MD

## 2015-09-22 NOTE — Patient Instructions (Addendum)
It was good to see you today.  We have reviewed your prior records including labs and tests today  Pneumonia vaccine updated today. Other Health Maintenance reviewed - all recommended immunizations and age-appropriate screenings are up-to-date.  Test(s) ordered today. Please return when you are fasting for these tests. Your results will be released to [place] (or called to you) after review, usually within 72hours after test completion. If any changes need to be made, you will be notified at that same time.  Medications reviewed and updated, no changes recommended at this time.  We will make appointment to see Dr. Charlann Boxer for evaluation of your shoulder symptoms  Please schedule followup in 12 months for annual exam and labs, call sooner if problems.  Health Maintenance, Female Adopting a healthy lifestyle and getting preventive care can go a long way to promote health and wellness. Talk with your health care provider about what schedule of regular examinations is right for you. This is a good chance for you to check in with your provider about disease prevention and staying healthy. In between checkups, there are plenty of things you can do on your own. Experts have done a lot of research about which lifestyle changes and preventive measures are most likely to keep you healthy. Ask your health care provider for more information. WEIGHT AND DIET  Eat a healthy diet  Be sure to include plenty of vegetables, fruits, low-fat dairy products, and lean protein.  Do not eat a lot of foods high in solid fats, added sugars, or salt.  Get regular exercise. This is one of the most important things you can do for your health.  Most adults should exercise for at least 150 minutes each week. The exercise should increase your heart rate and make you sweat (moderate-intensity exercise).  Most adults should also do strengthening exercises at least twice a week. This is in addition to the  moderate-intensity exercise.  Maintain a healthy weight  Body mass index (BMI) is a measurement that can be used to identify possible weight problems. It estimates body fat based on height and weight. Your health care provider can help determine your BMI and help you achieve or maintain a healthy weight.  For females 41 years of age and older:   A BMI below 18.5 is considered underweight.  A BMI of 18.5 to 24.9 is normal.  A BMI of 25 to 29.9 is considered overweight.  A BMI of 30 and above is considered obese.  Watch levels of cholesterol and blood lipids  You should start having your blood tested for lipids and cholesterol at 61 years of age, then have this test every 5 years.  You may need to have your cholesterol levels checked more often if:  Your lipid or cholesterol levels are high.  You are older than 61 years of age.  You are at high risk for heart disease.  CANCER SCREENING   Lung Cancer  Lung cancer screening is recommended for adults 74-56 years old who are at high risk for lung cancer because of a history of smoking.  A yearly low-dose CT scan of the lungs is recommended for people who:  Currently smoke.  Have quit within the past 15 years.  Have at least a 30-pack-year history of smoking. A pack year is smoking an average of one pack of cigarettes a day for 1 year.  Yearly screening should continue until it has been 15 years since you quit.  Yearly screening should stop  if you develop a health problem that would prevent you from having lung cancer treatment.  Breast Cancer  Practice breast self-awareness. This means understanding how your breasts normally appear and feel.  It also means doing regular breast self-exams. Let your health care provider know about any changes, no matter how small.  If you are in your 20s or 30s, you should have a clinical breast exam (CBE) by a health care provider every 1-3 years as part of a regular health exam.  If  you are 78 or older, have a CBE every year. Also consider having a breast X-ray (mammogram) every year.  If you have a family history of breast cancer, talk to your health care provider about genetic screening.  If you are at high risk for breast cancer, talk to your health care provider about having an MRI and a mammogram every year.  Breast cancer gene (BRCA) assessment is recommended for women who have family members with BRCA-related cancers. BRCA-related cancers include:  Breast.  Ovarian.  Tubal.  Peritoneal cancers.  Results of the assessment will determine the need for genetic counseling and BRCA1 and BRCA2 testing. Cervical Cancer Your health care provider may recommend that you be screened regularly for cancer of the pelvic organs (ovaries, uterus, and vagina). This screening involves a pelvic examination, including checking for microscopic changes to the surface of your cervix (Pap test). You may be encouraged to have this screening done every 3 years, beginning at age 22.  For women ages 71-65, health care providers may recommend pelvic exams and Pap testing every 3 years, or they may recommend the Pap and pelvic exam, combined with testing for human papilloma virus (HPV), every 5 years. Some types of HPV increase your risk of cervical cancer. Testing for HPV may also be done on women of any age with unclear Pap test results.  Other health care providers may not recommend any screening for nonpregnant women who are considered low risk for pelvic cancer and who do not have symptoms. Ask your health care provider if a screening pelvic exam is right for you.  If you have had past treatment for cervical cancer or a condition that could lead to cancer, you need Pap tests and screening for cancer for at least 20 years after your treatment. If Pap tests have been discontinued, your risk factors (such as having a new sexual partner) need to be reassessed to determine if screening should  resume. Some women have medical problems that increase the chance of getting cervical cancer. In these cases, your health care provider may recommend more frequent screening and Pap tests. Colorectal Cancer  This type of cancer can be detected and often prevented.  Routine colorectal cancer screening usually begins at 61 years of age and continues through 61 years of age.  Your health care provider may recommend screening at an earlier age if you have risk factors for colon cancer.  Your health care provider may also recommend using home test kits to check for hidden blood in the stool.  A small camera at the end of a tube can be used to examine your colon directly (sigmoidoscopy or colonoscopy). This is done to check for the earliest forms of colorectal cancer.  Routine screening usually begins at age 45.  Direct examination of the colon should be repeated every 5-10 years through 61 years of age. However, you may need to be screened more often if early forms of precancerous polyps or small growths are found.  Skin Cancer  Check your skin from head to toe regularly.  Tell your health care provider about any new moles or changes in moles, especially if there is a change in a mole's shape or color.  Also tell your health care provider if you have a mole that is larger than the size of a pencil eraser.  Always use sunscreen. Apply sunscreen liberally and repeatedly throughout the day.  Protect yourself by wearing long sleeves, pants, a wide-brimmed hat, and sunglasses whenever you are outside. HEART DISEASE, DIABETES, AND HIGH BLOOD PRESSURE   High blood pressure causes heart disease and increases the risk of stroke. High blood pressure is more likely to develop in:  People who have blood pressure in the high end of the normal range (130-139/85-89 mm Hg).  People who are overweight or obese.  People who are African American.  If you are 40-78 years of age, have your blood pressure  checked every 3-5 years. If you are 24 years of age or older, have your blood pressure checked every year. You should have your blood pressure measured twice--once when you are at a hospital or clinic, and once when you are not at a hospital or clinic. Record the average of the two measurements. To check your blood pressure when you are not at a hospital or clinic, you can use:  An automated blood pressure machine at a pharmacy.  A home blood pressure monitor.  If you are between 60 years and 89 years old, ask your health care provider if you should take aspirin to prevent strokes.  Have regular diabetes screenings. This involves taking a blood sample to check your fasting blood sugar level.  If you are at a normal weight and have a low risk for diabetes, have this test once every three years after 61 years of age.  If you are overweight and have a high risk for diabetes, consider being tested at a younger age or more often. PREVENTING INFECTION  Hepatitis B  If you have a higher risk for hepatitis B, you should be screened for this virus. You are considered at high risk for hepatitis B if:  You were born in a country where hepatitis B is common. Ask your health care provider which countries are considered high risk.  Your parents were born in a high-risk country, and you have not been immunized against hepatitis B (hepatitis B vaccine).  You have HIV or AIDS.  You use needles to inject street drugs.  You live with someone who has hepatitis B.  You have had sex with someone who has hepatitis B.  You get hemodialysis treatment.  You take certain medicines for conditions, including cancer, organ transplantation, and autoimmune conditions. Hepatitis C  Blood testing is recommended for:  Everyone born from [birth year] through 1967.  Anyone with known risk factors for hepatitis C. Sexually transmitted infections (STIs)  You should be screened for sexually transmitted infections (STIs)  including gonorrhea and chlamydia if:  You are sexually active and are younger than 61 years of age.  You are older than 61 years of age and your health care provider tells you that you are at risk for this type of infection.  Your sexual activity has changed since you were last screened and you are at an increased risk for chlamydia or gonorrhea. Ask your health care provider if you are at risk.  If you do not have HIV, but are at risk, it may be recommended that you take  a prescription medicine daily to prevent HIV infection. This is called pre-exposure prophylaxis (PrEP). You are considered at risk if:  You are sexually active and do not regularly use condoms or know the HIV status of your partner(s).  You take drugs by injection.  You are sexually active with a partner who has HIV. Talk with your health care provider about whether you are at high risk of being infected with HIV. If you choose to begin PrEP, you should first be tested for HIV. You should then be tested every 3 months for as long as you are taking PrEP.  PREGNANCY   If you are premenopausal and you may become pregnant, ask your health care provider about preconception counseling.  If you may become pregnant, take 400 to 800 micrograms (mcg) of folic acid every day.  If you want to prevent pregnancy, talk to your health care provider about birth control (contraception). OSTEOPOROSIS AND MENOPAUSE   Osteoporosis is a disease in which the bones lose minerals and strength with aging. This can result in serious bone fractures. Your risk for osteoporosis can be identified using a bone density scan.  If you are 78 years of age or older, or if you are at risk for osteoporosis and fractures, ask your health care provider if you should be screened.  Ask your health care provider whether you should take a calcium or vitamin D supplement to lower your risk for osteoporosis.  Menopause may have certain physical symptoms and  risks.  Hormone replacement therapy may reduce some of these symptoms and risks. Talk to your health care provider about whether hormone replacement therapy is right for you.  HOME CARE INSTRUCTIONS   Schedule regular health, dental, and eye exams.  Stay current with your immunizations.   Do not use any tobacco products including cigarettes, chewing tobacco, or electronic cigarettes.  If you are pregnant, do not drink alcohol.  If you are breastfeeding, limit how much and how often you drink alcohol.  Limit alcohol intake to no more than 1 drink per day for nonpregnant women. One drink equals 12 ounces of beer, 5 ounces of wine, or 1 ounces of hard liquor.  Do not use street drugs.  Do not share needles.  Ask your health care provider for help if you need support or information about quitting drugs.  Tell your health care provider if you often feel depressed.  Tell your health care provider if you have ever been abused or do not feel safe at home.   This information is not intended to replace advice given to you by your health care provider. Make sure you discuss any questions you have with your health care provider.   Document Released: 04/26/2011 Document Revised: 11/01/2014 Document Reviewed: 09/12/2013 Elsevier Interactive Patient Education [employer].

## 2015-09-22 NOTE — Assessment & Plan Note (Signed)
Chronic, felt related to prior breast cancer therapy in [place] annually to monitor No longer following regularly with hematology/oncology

## 2015-09-22 NOTE — Progress Notes (Signed)
Pre visit review using our clinic review tool, if applicable. No additional management support is needed unless otherwise documented below in the visit note. 

## 2015-09-24 ENCOUNTER — Other Ambulatory Visit (INDEPENDENT_AMBULATORY_CARE_PROVIDER_SITE_OTHER): Payer: BLUE CROSS/BLUE SHIELD

## 2015-09-24 DIAGNOSIS — Z1159 Encounter for screening for other viral diseases: Secondary | ICD-10-CM

## 2015-09-24 DIAGNOSIS — Z0000 Encounter for general adult medical examination without abnormal findings: Secondary | ICD-10-CM

## 2015-09-24 LAB — BASIC METABOLIC PANEL
BUN: 13 mg/dL (ref 6–23)
CO2: 30 mEq/L (ref 19–32)
Calcium: 9.4 mg/dL (ref 8.4–10.5)
Chloride: 103 mEq/L (ref 96–112)
Creatinine, Ser: 0.89 mg/dL (ref 0.40–1.20)
GFR: 68.43 mL/min (ref >60.00)
Glucose, Bld: 89 mg/dL (ref 70–99)
Potassium: 4.6 mEq/L (ref 3.5–5.1)
Sodium: 140 mEq/L (ref 135–145)

## 2015-09-24 LAB — CBC WITH DIFFERENTIAL/PLATELET
Basophils Absolute: 0 10*3/uL (ref 0.0–0.1)
Basophils Relative: 0.4 % (ref 0.0–3.0)
Eosinophils Absolute: 0.1 10*3/uL (ref 0.0–0.7)
Eosinophils Relative: 2.3 % (ref 0.0–5.0)
HCT: 38.1 % (ref 36.0–46.0)
Hemoglobin: 12.8 g/dL (ref 12.0–15.0)
Lymphocytes Relative: 38.3 % (ref 12.0–46.0)
Lymphs Abs: 1.1 10*3/uL (ref 0.7–4.0)
MCHC: 33.7 g/dL (ref 30.0–36.0)
MCV: 94.3 fl (ref 78.0–100.0)
Monocytes Absolute: 0.3 10*3/uL (ref 0.1–1.0)
Monocytes Relative: 9.1 % (ref 3.0–12.0)
Neutro Abs: 1.5 10*3/uL (ref 1.4–7.7)
Neutrophils Relative %: 49.9 % (ref 43.0–77.0)
Platelets: 209 10*3/uL (ref 150.0–400.0)
RBC: 4.04 Mil/uL (ref 3.87–5.11)
RDW: 12.7 % (ref 11.5–15.5)
WBC: 3 10*3/uL — ABNORMAL LOW (ref 4.0–10.5)

## 2015-09-24 LAB — LIPID PANEL
Cholesterol: 169 mg/dL (ref 0–200)
HDL: 67.3 mg/dL (ref >39.00)
LDL Cholesterol: 90 mg/dL (ref 0–99)
NonHDL: 101.6
Total CHOL/HDL Ratio: 3
Triglycerides: 59 mg/dL (ref 0.0–149.0)
VLDL: 11.8 mg/dL (ref 0.0–40.0)

## 2015-09-24 LAB — TSH: TSH: 4.79 u[IU]/mL — ABNORMAL HIGH (ref 0.35–4.50)

## 2015-09-25 LAB — HEPATITIS C ANTIBODY: HCV Ab: NEGATIVE

## 2015-09-28 ENCOUNTER — Encounter: Payer: Self-pay | Admitting: Internal Medicine

## 2015-09-28 DIAGNOSIS — E039 Hypothyroidism, unspecified: Secondary | ICD-10-CM | POA: Insufficient documentation

## 2015-09-28 HISTORY — DX: Hypothyroidism, unspecified: E03.9

## 2015-10-03 ENCOUNTER — Telehealth: Payer: Self-pay | Admitting: Internal Medicine

## 2015-10-03 ENCOUNTER — Encounter: Payer: Self-pay | Admitting: Internal Medicine

## 2015-10-03 DIAGNOSIS — E038 Other specified hypothyroidism: Secondary | ICD-10-CM

## 2015-10-03 MED ORDER — LEVOTHYROXINE SODIUM 50 MCG PO TABS: 50.0000 ug | ORAL_TABLET | Freq: Every day | ORAL | Status: DC

## 2015-10-03 NOTE — Telephone Encounter (Signed)
Pt last saw Dr Asa Lente, pt has not yet seen Dr Quay Burow.

## 2015-10-03 NOTE — Telephone Encounter (Signed)
erx done: 50 mcg levothyroxin per PCP indication of last labs.  TSH lab ordered to be done the 1st/2nd week of March.  Pt informed and stated understanding.

## 2015-10-03 NOTE — Telephone Encounter (Signed)
Pt request to speak to the assistant, pt state that she was advise to take synthroid but she wants to consult with her oncologist first. Please give her a call back at   Phone # [phone]

## 2015-10-06 ENCOUNTER — Ambulatory Visit: Payer: BLUE CROSS/BLUE SHIELD | Admitting: Family Medicine

## 2015-10-07 ENCOUNTER — Other Ambulatory Visit (INDEPENDENT_AMBULATORY_CARE_PROVIDER_SITE_OTHER): Payer: BLUE CROSS/BLUE SHIELD

## 2015-10-07 ENCOUNTER — Ambulatory Visit (INDEPENDENT_AMBULATORY_CARE_PROVIDER_SITE_OTHER)
Admission: RE | Admit: 2015-10-07 | Discharge: 2015-10-07 | Disposition: A | Discharge status: Home / Self Care | Payer: BLUE CROSS/BLUE SHIELD | Source: Ambulatory Visit | Attending: Family Medicine | Admitting: Family Medicine

## 2015-10-07 ENCOUNTER — Encounter: Payer: Self-pay | Admitting: Family Medicine

## 2015-10-07 ENCOUNTER — Other Ambulatory Visit: Payer: Self-pay

## 2015-10-07 ENCOUNTER — Ambulatory Visit (INDEPENDENT_AMBULATORY_CARE_PROVIDER_SITE_OTHER): Payer: BLUE CROSS/BLUE SHIELD | Admitting: Family Medicine

## 2015-10-07 VITALS — BP 110/70 | HR 80 | Ht 68.0 in | Wt 153.0 lb

## 2015-10-07 DIAGNOSIS — M75102 Unspecified rotator cuff tear or rupture of left shoulder, not specified as traumatic: Secondary | ICD-10-CM | POA: Diagnosis not present

## 2015-10-07 DIAGNOSIS — M25512 Pain in left shoulder: Secondary | ICD-10-CM | POA: Diagnosis not present

## 2015-10-07 DIAGNOSIS — Z1231 Encounter for screening mammogram for malignant neoplasm of breast: Secondary | ICD-10-CM

## 2015-10-07 NOTE — Progress Notes (Signed)
Pre visit review using our clinic review tool, if applicable. No additional management support is needed unless otherwise documented below in the visit note. 

## 2015-10-07 NOTE — Progress Notes (Signed)
[name] [place], [place] [address] Phone: [phone] Subjective:    I'm seeing this patient by the request  of:  Binnie Rail, MD   CC: Left shoulder pain.  [id number] Brittany [name] is a 59 y.o. female coming in with complaint of left shoulder pain. Patient is very active lifting weights, swimming as well as play tennis. Over the course last 3-4 months hasn't started to have increased pain. Seems to be more on the left shoulder. Describes a dull aching sensation with sharp pain with certain movements. Things such as reaching above her head or behind her back seems to be the most uncomfortable. Can wake her up when she rolls onto her left arm. Denies any radiation down the arm or any numbness or severe weakness. Does notice that she does compensate using her right arm a significant more. States that with the freestyle stroke in the pool she is unfortunately having pain as well at stops her from activity. Does respond to anti-inflammatories.  Past Medical History  Diagnosis Date  . ANEMIA-NOS   . Asymptomatic varicose veins   . BREAST CANCER, HX OF 01/1995 dx    s/p Lumpectomy, XRT, chemo and 52yr armidex  . OSTEOARTHRITIS   . SCOLIOSIS, MILD   . Leukopenia     mild since chemo  . Gestational diabetes mellitus in childbirth, diet controlled 1985  . Hypothyroid 09/28/2015    Dx 09/2015   Past Surgical History  Procedure Laterality Date  . Left lumpectomy  1996  . Tonsillectomy    . Breast biopsy    . Total hip arthroplasty      2002 & 2003-Dr. vail @ [facility]   Social History  Substance Use Topics  . Smoking status: Former Smoker    Quit date: 06/25/1977  . Smokeless tobacco: None  . Alcohol Use: No   No Known Allergies   Family History  Problem Relation Age of Onset  . Arthritis Mother   . Breast cancer Mother 70  . Hyperlipidemia Mother   . Heart disease Father 50    AMI  . Hyperlipidemia Father   . Valvular heart  disease Father 45  . Parkinson's disease Father 89  . Diabetes Paternal Grandfather     type 2  . Pancreatic cancer Maternal Grandmother 73    presumed dx        Past medical history, social, surgical and family history all reviewed in electronic medical record.   Review of Systems: No headache, visual changes, nausea, vomiting, diarrhea, constipation, dizziness, abdominal pain, skin rash, fevers, chills, night sweats, weight loss, swollen lymph nodes, body aches, joint swelling, muscle aches, chest pain, shortness of breath, mood changes.   Objective Blood pressure 110/70, pulse 80, height 5\' 8"  (1.727 m), weight 153 lb (69.4 kg), SpO2 99 %.  General: No apparent distress alert and oriented x3 mood and affect normal, dressed appropriately.  HEENT: Pupils equal, extraocular movements intact  Respiratory: Patient's speak in full sentences and does not appear short of breath  Cardiovascular: No lower extremity edema, non tender, no erythema  Skin: Warm dry intact with no signs of infection or rash on extremities or on axial skeleton.  Abdomen: Soft nontender  Neuro: Cranial nerves II through XII are intact, neurovascularly intact in all extremities with 2+ DTRs and 2+ pulses.  Lymph: No lymphadenopathy of posterior or anterior cervical chain or axillae bilaterally.  Gait normal with good balance and coordination.  MSK:  Non tender with full range of motion and good stability and symmetric strength and tone of  elbows, wrist, hip, knee and ankles bilaterally. Patient does have kyphosis with some mild scoliosis of the thoracolumbar junction. Shoulder: left Inspection reveals no abnormalities, atrophy or asymmetry. Palpation is normal with no tenderness over AC joint or bicipital groove. ROM is full in all planes passively. Rotator cuff strength 4 out of 5 compared to the contralateral side. signs of impingement with positive Neer and Hawkin's tests, but negative empty can sign. But does  have pain with testing. Speeds and Yergason's tests normal. No labral pathology noted with negative Obrien's, negative clunk and good stability. Normal scapular function observed. No painful arc and no drop arm sign. No apprehension sign Contralateral shoulder unremarkable.  MSK US performed of: left This study was ordered, performed, and interpreted by Charlann Boxer D.O.  Shoulder:   Supraspinatus:  Appears normal with minimal intersubstance tearing on long and transverse views, Bursal bulge seen with shoulder abduction on impingement view. Infraspinatus:  Near full-thickness tear noted. Seems to have a retraction of 1.5 cm.. Significant increase in Doppler flow Subscapularis:  Appears normal on long and transverse views. Positive bursa Teres Minor:  Appears normal on long and transverse views. AC joint:  Moderate arthritis Glenohumeral Joint:  Mild underlying arthritis Glenoid Labrum:  No chronic tearing noted Biceps Tendon:  Appears normal on long and transverse views, no fraying of tendon, tendon located in intertubercular groove, no subluxation with shoulder internal or external rotation.  Impression: Subacromial bursitis, near full-thickness tear of the subscapularis with retraction.  Procedure: Real-time Ultrasound Guided Injection of left glenohumeral joint Device: GE Logiq E  Ultrasound guided injection is preferred based studies that show increased duration, increased effect, greater accuracy, decreased procedural pain, increased response rate with ultrasound guided versus blind injection.  Verbal informed consent obtained.  Time-out conducted.  Noted no overlying erythema, induration, or other signs of local infection.  Skin prepped in a sterile fashion.  Local anesthesia: Topical Ethyl chloride.  With sterile technique and under real time ultrasound guidance:  Joint visualized.  23g 1  inch needle inserted posterior approach. Pictures taken for needle placement. Patient did  have injection of 2 cc of 1% lidocaine, 2 cc of 0.5% Marcaine, and 1.0 cc of Kenalog 40 mg/dL. Completed without difficulty  Pain immediately resolved suggesting accurate placement of the medication.  Advised to call if fevers/chills, erythema, induration, drainage, or persistent bleeding.  Images permanently stored and available for review in the ultrasound unit.  Impression: Technically successful ultrasound guided injection.     Impression and Recommendations:     This case required medical decision making of moderate complexity.

## 2015-10-07 NOTE — Patient Instructions (Addendum)
Good to see you.  Ice 20 minutes 2 times daily. Usually after activity and before bed. Exercises 3 times a week.  Vitamin D 2000 IU daily Turmeric 500mg  daily pennsaid pinkie amount topically 2 times daily as needed.  Xray downstairs See me again in 3-4 weeks.  Happy holidays!

## 2015-10-07 NOTE — Assessment & Plan Note (Signed)
Injected today. X-rays to rule out any other bony normality secondary to patient's breast cancer ordered. May opinion. Patient did respond very well to the injection with complete resolution of pain. Still some mild weakness. We discussed the prognosis. Patient will try home exercises, topical anti-inflammatories prescription sent in, icing protocol as well as decreasing patient's lifting and slowly increasing over the course the next several weeks. Patient come back in 3-4 weeks for further evaluation and treatment.

## 2015-10-10 ENCOUNTER — Telehealth: Payer: Self-pay | Admitting: *Deleted

## 2015-10-10 MED ORDER — DICLOFENAC SODIUM 2 % TD SOLN: TRANSDERMAL | Status: DC

## 2015-10-10 NOTE — Telephone Encounter (Signed)
Pt called and stated that she saw Dr. Tamala Julian on the 13th, and he was going to send a topically anti med to [facility] and they stated they have not received. Per chart md was going to rx Pennsaid inform pt we will resend...Brittany [name]

## 2015-10-28 ENCOUNTER — Ambulatory Visit (INDEPENDENT_AMBULATORY_CARE_PROVIDER_SITE_OTHER): Payer: BLUE CROSS/BLUE SHIELD | Admitting: Family Medicine

## 2015-10-28 ENCOUNTER — Encounter: Payer: Self-pay | Admitting: Family Medicine

## 2015-10-28 VITALS — BP 118/84 | HR 62 | Ht 68.0 in | Wt 151.0 lb

## 2015-10-28 DIAGNOSIS — M75102 Unspecified rotator cuff tear or rupture of left shoulder, not specified as traumatic: Secondary | ICD-10-CM | POA: Diagnosis not present

## 2015-10-28 NOTE — Progress Notes (Signed)
Pre visit review using our clinic review tool, if applicable. No additional management support is needed unless otherwise documented below in the visit note. 

## 2015-10-28 NOTE — Assessment & Plan Note (Signed)
Patient is making some improvement. Patient does have some arthritic changes of the shoulders was potential loose bodies. We discussed with patient if there is any locking occurring or any weakness of the arm that we do need to consider advanced imaging. Patient has made progress toe and does have even increasing strength of the rotator cuff. We will get an repeat ultrasound at next visit. I do feel referral to formal physical therapy will be beneficial. Patient will try this. Patient has any worsening symptoms she will come in sooner otherwise I like her to follow-up again in 4-6 weeks. Patient given exercises prescription to start increasing weight slowly and will start increasing her activity and given a graduated routine.  Spent  25 minutes with patient face-to-face and had greater than 50% of counseling including as described above in assessment and plan.

## 2015-10-28 NOTE — Patient Instructions (Signed)
Good to see you Ice is your friend Ice 20 minutes 2 times daily. Usually after activity and before bed. Continue the pennsaid twice daily Continue the vitamin D Physical therapy will be calling you  OK to add weight 1-2# and increase 1# every 2 weeks See me again in 4-6 weeks and we will scan it again to make sure healing appropriately.  Happy New Year!

## 2015-10-28 NOTE — Progress Notes (Signed)
[facility] [place], [place] [address] Phone: [phone] Subjective:    I'm seeing this patient by the request  of:  Binnie Rail, MD   CC: Left shoulder pain.  [id number] Brittany [name] is a 60 y.o. female coming in with complaint of left shoulder pain. Patient is very active lifting weights, swimming as well as play tennis.  Patient seen though and did have a near full-thickness tear of the rotator cuff as well as significant subacromial bursitis. Because the patient's history of breast cancer patient did have x-rays of her shoulder. These were independently visualized again today showing patient did have possible loose bodies within the shoulder. Patient to try conservative therapy and was given up her prescription for topical anti-inflammatories. Patient states she is approximately 60% better. Describes the pain as more of a dull aching sensation. States that she's been able to sleep on it and is playing tennis again. Continues to have though some severe pain with certain range of motion but seems to go away after seconds. Denies any numbness. Denies any radiation of the pain denies any new pain.  Past Medical History  Diagnosis Date  . ANEMIA-NOS   . Asymptomatic varicose veins   . BREAST CANCER, HX OF 01/1995 dx    s/p Lumpectomy, XRT, chemo and 45yr armidex  . OSTEOARTHRITIS   . SCOLIOSIS, MILD   . Leukopenia     mild since chemo  . Gestational diabetes mellitus in childbirth, diet controlled 1985  . Hypothyroid 09/28/2015    Dx 09/2015   Past Surgical History  Procedure Laterality Date  . Left lumpectomy  1996  . Tonsillectomy    . Breast biopsy    . Total hip arthroplasty      2002 & 2003-Dr. vail @ [place]   Social History  Substance Use Topics  . Smoking status: Former Smoker    Quit date: 06/25/1977  . Smokeless tobacco: None  . Alcohol Use: No   No Known Allergies   Family History  Problem Relation Age of Onset    . Arthritis Mother   . Breast cancer Mother 97  . Hyperlipidemia Mother   . Heart disease Father 29    AMI  . Hyperlipidemia Father   . Valvular heart disease Father 82  . Parkinson's disease Father 15  . Diabetes Paternal Grandfather     type 2  . Pancreatic cancer Maternal Grandmother 73    presumed dx        Past medical history, social, surgical and family history all reviewed in electronic medical record.   Review of Systems: No headache, visual changes, nausea, vomiting, diarrhea, constipation, dizziness, abdominal pain, skin rash, fevers, chills, night sweats, weight loss, swollen lymph nodes, body aches, joint swelling, muscle aches, chest pain, shortness of breath, mood changes.   Objective Blood pressure 118/84, pulse 62, height 5\' 8"  (1.727 m), weight 151 lb (68.493 kg), SpO2 98 %.  General: No apparent distress alert and oriented x3 mood and affect normal, dressed appropriately.  HEENT: Pupils equal, extraocular movements intact  Respiratory: Patient's speak in full sentences and does not appear short of breath  Cardiovascular: No lower extremity edema, non tender, no erythema  Skin: Warm dry intact with no signs of infection or rash on extremities or on axial skeleton.  Abdomen: Soft nontender  Neuro: Cranial nerves II through XII are intact, neurovascularly intact in all extremities with 2+ DTRs and 2+ pulses.  Lymph: No  lymphadenopathy of posterior or anterior cervical chain or axillae bilaterally.  Gait normal with good balance and coordination.  MSK:  Non tender with full range of motion and good stability and symmetric strength and tone of  elbows, wrist, hip, knee and ankles bilaterally. Patient does have kyphosis with some mild scoliosis of the thoracolumbar junction. Shoulder: left Inspection reveals no abnormalities, atrophy or asymmetry. Palpation is normal with no tenderness over AC joint or bicipital groove. ROM is full in all planes passively. Rotator  cuff strength 4+ out of 5 compared to the contralateral side. signs of impingement with positive Neer and Hawkin's tests improvement, but negative empty can sign.  Speeds and Yergason's tests normal. No labral pathology noted with negative Obrien's, negative clunk and good stability. Normal scapular function observed. No painful arc and no drop arm sign. No apprehension sign Contralateral shoulder unremarkable.      Impression and Recommendations:     This case required medical decision making of moderate complexity.

## 2015-11-04 ENCOUNTER — Ambulatory Visit: Payer: BLUE CROSS/BLUE SHIELD | Attending: Internal Medicine

## 2015-11-04 DIAGNOSIS — R293 Abnormal posture: Secondary | ICD-10-CM | POA: Insufficient documentation

## 2015-11-04 DIAGNOSIS — R29898 Other symptoms and signs involving the musculoskeletal system: Secondary | ICD-10-CM | POA: Insufficient documentation

## 2015-11-04 DIAGNOSIS — M25512 Pain in left shoulder: Secondary | ICD-10-CM | POA: Insufficient documentation

## 2015-11-04 DIAGNOSIS — M25619 Stiffness of unspecified shoulder, not elsewhere classified: Secondary | ICD-10-CM | POA: Diagnosis present

## 2015-11-04 NOTE — Therapy (Signed)
[place], [place], [address] Phone: [phone]   Fax:  [phone]  Physical Therapy Evaluation  Patient Details  Name: Brittany [name] MRN: [id number] Date of Birth: 02/06/1956 Referring Provider: Hulan Saas.MD  Encounter Date: 11/04/2015      PT End of Session - 11/04/15 1639    Visit Number 1   Number of Visits 8   Date for PT Re-Evaluation 12/05/15   Authorization Type BCBS   PT Start Time 0345   PT Stop Time 0430   PT Time Calculation (min) 45 min   Activity Tolerance Patient tolerated treatment well   Behavior During Therapy [facility] for tasks assessed/performed      Past Medical History  Diagnosis Date  . ANEMIA-NOS   . Asymptomatic varicose veins   . BREAST CANCER, HX OF 01/1995 dx    s/p Lumpectomy, XRT, chemo and 89yr armidex  . OSTEOARTHRITIS   . SCOLIOSIS, MILD   . Leukopenia     mild since chemo  . Gestational diabetes mellitus in childbirth, diet controlled 1985  . Hypothyroid 09/28/2015    Dx 09/2015    Past Surgical History  Procedure Laterality Date  . Left lumpectomy  1996  . Tonsillectomy    . Breast biopsy    . Total hip arthroplasty      2002 & 2003-Dr. vail @ [facility]    There were no vitals filed for this visit.  Visit Diagnosis:  Weakness of shoulder - Plan: PT plan of care cert/re-cert  Pain, joint, shoulder, left - Plan: PT plan of care cert/re-cert  Abnormal posture - Plan: PT plan of care cert/re-cert  Stiffness of shoulder joint, unspecified laterality - Plan: PT plan of care cert/re-cert      Subjective Assessment - 11/04/15 1557    Subjective No injury. She is improving with pain and strength. She has pain with tossing tennis ball and backswing with golf.    Limitations --  Sleeping on shoulder wakes due  to pain., carrying bags   Diagnostic tests [place] and xrays   Patient Stated Goals Heal RTC tear so as to not have surgery.   Currently in Pain?  No/denies  not at rest but with carrying and movements.  PAin levels are 2-3 wihen she has pain   Multiple Pain Sites No            [facility] PT Assessment - 11/04/15 1550    Assessment   Medical Diagnosis LT RTC tear   Referring Provider Hulan Saas.MD   Onset Date/Surgical Date --  a year and half   Hand Dominance Right   Next MD Visit Early Feb 2017   Prior Therapy No   Precautions   Precautions Shoulder   Precaution Comments Limit weight to 1-2 pounds   Restrictions   Weight Bearing Restrictions No   Balance Screen   Has the patient fallen in the past 6 months No   Has the patient had a decrease in activity level because of a fear of falling?  No   Is the patient reluctant to leave their home because of a fear of falling?  No   Prior Function   Level of Independence Independent   Cognition   Overall Cognitive Status Within Functional Limits for tasks assessed   Observation/Other Assessments   Focus on Therapeutic Outcomes (FOTO)  39% limited   Posture/Postural Control   Posture/Postural Control No significant limitations   ROM / Strength   AROM /  PROM / Strength AROM;Strength   AROM   AROM Assessment Site Shoulder   Right/Left Shoulder Right;Left   Right Shoulder Extension 62 Degrees   Right Shoulder Flexion 154 Degrees   Right Shoulder ABduction 170 Degrees   Right Shoulder Internal Rotation 47 Degrees   Right Shoulder External Rotation 85 Degrees   Right Shoulder Horizontal ABduction 34 Degrees   Right Shoulder Horizontal  ADduction 113 Degrees   Left Shoulder Extension 65 Degrees   Left Shoulder Flexion 155 Degrees   Left Shoulder ABduction 170 Degrees   Left Shoulder Internal Rotation 50 Degrees   Left Shoulder External Rotation 90 Degrees   Left Shoulder Horizontal ABduction 34 Degrees   Left Shoulder Horizontal ADduction 114 Degrees   Strength   Strength Assessment Site Shoulder   Right/Left Shoulder Right;Left   Right Shoulder Flexion 5/5   Right  Shoulder Extension 5/5   Right Shoulder ABduction 5/5   Right Shoulder Internal Rotation 5/5   Right Shoulder External Rotation 5/5   Right Shoulder Horizontal ABduction 5/5   Right Shoulder Horizontal ADduction 5/5   Left Shoulder Flexion 5/5   Left Shoulder Extension 5/5   Left Shoulder ABduction 4+/5   Left Shoulder Internal Rotation 5/5   Left Shoulder External Rotation 5/5   Left Shoulder Horizontal ABduction 5/5   Left Shoulder Horizontal ADduction 5/5   Ambulation/Gait   Gait Comments Normal      Issued [place] HEP with blue band with caution to start slow and progress reps and tension carefully. Blue band issued.                        PT Short Term Goals - 11/04/15 1740    PT SHORT TERM GOAL #1   Title She will be independent with inital HEP    Time 2   Period Weeks   Status New           PT Long Term Goals - 11/04/15 1741    PT LONG TERM GOAL #1   Title She will be independent with all HEP issued as of last visit   Time 4   Period Weeks   Status New   PT LONG TERM GOAL #2   Title She will report pain improved 25% or more with tossing ball for tennis    Time 4   Period Weeks   Status New   PT LONG TERM GOAL #3   Title She will report decreased 25% or more  pain with lying on LT shoulder    Time 4   Period Weeks   Status New   PT LONG TERM GOAL #4   Title She will report imroved comfort with carrying bags in LT arm   Time 4   Period Weeks   Status New   PT LONG TERM GOAL #5   Title She will score on FOTO < 20% limited  to demo improved function with less pain   Time 4   Period Weeks               Plan - 11/04/15 1640    Clinical Impression Statement Ms Zumstein present with mild LT shoulder abduciton weakness and pain. . She is limited with lying on LT shoulder, tossing tennis ball, carrying bags,   Pt will benefit from skilled therapeutic intervention in order to improve on the following deficits Impaired UE functional  use;Pain;Decreased range of motion;Decreased strength   Rehab Potential Good  PT Frequency 2x / week   PT Duration 4 weeks   PT Treatment/Interventions Taping;Manual techniques;Dry needling;Passive range of motion;Ultrasound;Iontophoresis 4mg /ml Dexamethasone;Cryotherapy;Patient/family education   PT Next Visit Plan Modalities as needed, , review rockwood, prone exercises   PT Home Exercise Plan [place] blue band   Consulted and Agree with Plan of Care Patient         Problem List Patient Active Problem List   Diagnosis Date Noted  . Left rotator cuff tear 10/07/2015  . Hypothyroid 09/28/2015  . Leukopenia 12/08/2012  . Metatarsalgia of left foot 09/21/2011  . CERVICALGIA 05/04/2010  . ANEMIA-NOS 03/17/2009  . ASYMPTOMATIC VARICOSE VEINS 03/17/2009  . OSTEOARTHRITIS 03/17/2009  . SCOLIOSIS, MILD 03/17/2009  . BREAST CANCER, HX OF 03/17/2009    Darrel Hoover PT  11/04/2015, 5:47 PM  [facility] [address] [place], [place], [address] Phone: [phone]   Fax:  [phone]  Name: TAWONNA KALP MRN: [id number] Date of Birth: Jun 02, 1956

## 2015-11-11 ENCOUNTER — Ambulatory Visit: Payer: BLUE CROSS/BLUE SHIELD | Admitting: Physical Therapy

## 2015-11-11 DIAGNOSIS — M25619 Stiffness of unspecified shoulder, not elsewhere classified: Secondary | ICD-10-CM

## 2015-11-11 DIAGNOSIS — R29898 Other symptoms and signs involving the musculoskeletal system: Secondary | ICD-10-CM | POA: Diagnosis not present

## 2015-11-11 DIAGNOSIS — R293 Abnormal posture: Secondary | ICD-10-CM

## 2015-11-11 DIAGNOSIS — M25512 Pain in left shoulder: Secondary | ICD-10-CM

## 2015-11-11 NOTE — Patient Instructions (Signed)
Over Head Pull: Narrow Grip       On back, knees bent, feet flat, band across thighs, elbows straight but relaxed. Pull hands apart (start). Keeping elbows straight, bring arms up and over head, hands toward floor. Keep pull steady on band. Hold momentarily. Return slowly, keeping pull steady, back to start. Repeat __5 to 10_ times. Band color _yellow_____   Side Pull: Double Arm   On back, knees bent, feet flat. Arms perpendicular to body, shoulder level, elbows straight but relaxed. Pull arms out to sides, elbows straight. Resistance band comes across collarbones, hands toward floor. Hold momentarily. Slowly return to starting position. Repeat 5-10___ times. Band color __yellow___   Sash   On back, knees bent, feet flat, left hand on left hip, right hand above left. Pull right arm DIAGONALLY (hip to shoulder) across chest. Bring right arm along head toward floor. Hold momentarily. Slowly return to starting position. Repeat _10__ times. Do with left arm. Band color yellow______   Shoulder Rotation: Double Arm   On back, knees bent, feet flat, elbows tucked at sides, bent 90, hands palms up. Pull hands apart and down toward floor, keeping elbows near sides. Hold momentarily. Slowly return to starting position. Repeat _5 to 10__ times. Band color _yellow_____

## 2015-11-11 NOTE — Therapy (Signed)
[place], [place], [address] Phone: [phone]   Fax:  [phone]  Physical Therapy Treatment  Patient Details  Name: Brittany [name] MRN: [id number] Date of Birth: September 08, 1956 Referring Provider: Hulan Saas.MD  Encounter Date: 11/11/2015      PT End of Session - 11/11/15 1506    Visit Number 2   Number of Visits 8   Date for PT Re-Evaluation 12/05/15   PT Start Time 1150   PT Stop Time 1235   PT Time Calculation (min) 45 min   Activity Tolerance Patient tolerated treatment well;No increased pain   Behavior During Therapy [facility] for tasks assessed/performed      Past Medical History  Diagnosis Date  . ANEMIA-NOS   . Asymptomatic varicose veins   . BREAST CANCER, HX OF 01/1995 dx    s/p Lumpectomy, XRT, chemo and 73yr armidex  . OSTEOARTHRITIS   . SCOLIOSIS, MILD   . Leukopenia     mild since chemo  . Gestational diabetes mellitus in childbirth, diet controlled 1985  . Hypothyroid 09/28/2015    Dx 09/2015    Past Surgical History  Procedure Laterality Date  . Left lumpectomy  1996  . Tonsillectomy    . Breast biopsy    . Total hip arthroplasty      2002 & 2003-Dr. vail @ [place]    There were no vitals filed for this visit.  Visit Diagnosis:  Weakness of shoulder  Pain, joint, shoulder, left  Abnormal posture  Stiffness of shoulder joint, unspecified laterality      Subjective Assessment - 11/11/15 1456    Subjective Less pain already.  Will not play golf until spring.  Plays tennis next Tuesday.   Currently in Pain? No/denies                         [facility] Adult PT Treatment/Exercise - 11/11/15 1155    Shoulder Exercises: Supine   Other Supine Exercises supins scapular stabilization with yellow bands  ER/horizontal pull/ sash, narrow grip/  10 X each added to home   Shoulder Exercises: Standing   Other Standing Exercises blue band rockwood 10-15 reps each,   added towel roll for IR/ER minor cues used   Shoulder Exercises: Stretch   Other Shoulder Stretches supine over rolled towel Chest stretch 3-4 minutes,  also ER shoulder stretch.  added to home, she has a foam roller at home.   Manual Therapy   Manual therapy comments 3 y's Lt shoulder posture correction, inhibit supraspinatus/deltoid.                PT Education - 11/11/15 1506    Education provided Yes   Person(s) Educated Patient   Methods Explanation;Demonstration;Verbal cues;Handout   Comprehension Verbalized understanding;Returned demonstration          PT Short Term Goals - 11/11/15 1508    PT SHORT TERM GOAL #1   Title She will be independent with inital HEP    Baseline minor cues   Time 2   Period Weeks   Status On-going           PT Long Term Goals - 11/04/15 1741    PT LONG TERM GOAL #1   Title She will be independent with all HEP issued as of last visit   Time 4   Period Weeks   Status New   PT LONG TERM GOAL #2   Title She will  report pain improved 25% or more with tossing ball for tennis    Time 4   Period Weeks   Status New   PT LONG TERM GOAL #3   Title She will report decreased 25% or more  pain with lying on LT shoulder    Time 4   Period Weeks   Status New   PT LONG TERM GOAL #4   Title She will report imroved comfort with carrying bags in LT arm   Time 4   Period Weeks   Status New   PT LONG TERM GOAL #5   Title She will score on FOTO < 20% limited  to demo improved function with less pain   Time 4   Period Weeks               Plan - 11/11/15 1507    Clinical Impression Statement progress toward home exercise goal.  Pain improving.   PT Next Visit Plan review exercises, assess taping,  prone exercises.  [place]?   PT Home Exercise Plan supine scapulat stabilization.   Consulted and Agree with Plan of Care Patient        Problem List Patient Active Problem List   Diagnosis Date Noted  . Left rotator cuff tear  10/07/2015  . Hypothyroid 09/28/2015  . Leukopenia 12/08/2012  . Metatarsalgia of left foot 09/21/2011  . CERVICALGIA 05/04/2010  . ANEMIA-NOS 03/17/2009  . ASYMPTOMATIC VARICOSE VEINS 03/17/2009  . OSTEOARTHRITIS 03/17/2009  . SCOLIOSIS, MILD 03/17/2009  . BREAST CANCER, HX OF 03/17/2009    [facility] 11/11/2015, 3:09 PM  [facility] [address] [place], [place], [address] Phone: [phone]   Fax:  [phone]  Name: KIPPIE RIMMER MRN: [id number] Date of Birth: 1956-05-26    Melvenia Needles, PTA 11/11/2015 3:09 PM Phone: [phone] Fax: [phone]

## 2015-11-14 ENCOUNTER — Ambulatory Visit
Admission: RE | Admit: 2015-11-14 | Discharge: 2015-11-14 | Disposition: A | Discharge status: Home / Self Care | Payer: BLUE CROSS/BLUE SHIELD | Source: Ambulatory Visit

## 2015-11-14 ENCOUNTER — Ambulatory Visit: Payer: BLUE CROSS/BLUE SHIELD

## 2015-11-14 DIAGNOSIS — M25619 Stiffness of unspecified shoulder, not elsewhere classified: Secondary | ICD-10-CM

## 2015-11-14 DIAGNOSIS — M25512 Pain in left shoulder: Secondary | ICD-10-CM

## 2015-11-14 DIAGNOSIS — R29898 Other symptoms and signs involving the musculoskeletal system: Secondary | ICD-10-CM

## 2015-11-14 DIAGNOSIS — Z1231 Encounter for screening mammogram for malignant neoplasm of breast: Secondary | ICD-10-CM

## 2015-11-14 DIAGNOSIS — R293 Abnormal posture: Secondary | ICD-10-CM

## 2015-11-14 NOTE — Therapy (Signed)
[place], [place], [address] Phone: [phone]   Fax:  [phone]  Physical Therapy Treatment  Patient Details  Name: Brittany [name] MRN: [id number] Date of Birth: July 03, 1956 Referring Provider: Hulan Saas.MD  Encounter Date: 11/14/2015      PT End of Session - 11/14/15 1000    Visit Number 3   Number of Visits 8   Date for PT Re-Evaluation 12/05/15   PT Start Time 0845   PT Stop Time 0930   PT Time Calculation (min) 45 min   Activity Tolerance Patient tolerated treatment well   Behavior During Therapy [facility] for tasks assessed/performed      Past Medical History  Diagnosis Date  . ANEMIA-NOS   . Asymptomatic varicose veins   . BREAST CANCER, HX OF 01/1995 dx    s/p Lumpectomy, XRT, chemo and 26yr armidex  . OSTEOARTHRITIS   . SCOLIOSIS, MILD   . Leukopenia     mild since chemo  . Gestational diabetes mellitus in childbirth, diet controlled 1985  . Hypothyroid 09/28/2015    Dx 09/2015    Past Surgical History  Procedure Laterality Date  . Left lumpectomy  1996  . Tonsillectomy    . Breast biopsy    . Total hip arthroplasty      2002 & 2003-Dr. vail @ [place]    There were no vitals filed for this visit.  Visit Diagnosis:  Weakness of shoulder  Pain, joint, shoulder, left  Abnormal posture  Stiffness of shoulder joint, unspecified laterality      Subjective Assessment - 11/14/15 0852    Subjective Feel I'm improving. Tape seems to help.    Currently in Pain? No/denies                         [facility] Adult PT Treatment/Exercise - 11/14/15 0853    Shoulder Exercises: Supine   Other Supine Exercises Supine stretch over 1/2 roller and green band exercises with flexion with hor abd pull , ER , hor abduct, and diagonals x 20 .    Shoulder Exercises: Standing   Other Standing Exercises blue band rockwood 20 reps each,  added towel roll for IR/ER minor cues used, also  did scapula protraction /retraction with cues to depress scapula x 20 in pushup position on counter  and 20 ush ups with cues for scapula position and abdominal set to have bette core stability   Shoulder Exercises: ROM/Strengthening   UBE (Upper Arm Bike) 3 min forw and 3 min back L2                PT Education - 11/14/15 1000    Education provided Yes   Education Details counter push upand scap movement   Person(s) Educated Patient   Methods Explanation;Demonstration;Tactile cues;Verbal cues   Comprehension Verbalized understanding;Returned demonstration          PT Short Term Goals - 11/11/15 1508    PT SHORT TERM GOAL #1   Title She will be independent with inital HEP    Baseline minor cues   Time 2   Period Weeks   Status On-going           PT Long Term Goals - 11/04/15 1741    PT LONG TERM GOAL #1   Title She will be independent with all HEP issued as of last visit   Time 4   Period Weeks   Status New  PT LONG TERM GOAL #2   Title She will report pain improved 25% or more with tossing ball for tennis    Time 4   Period Weeks   Status New   PT LONG TERM GOAL #3   Title She will report decreased 25% or more  pain with lying on LT shoulder    Time 4   Period Weeks   Status New   PT LONG TERM GOAL #4   Title She will report imroved comfort with carrying bags in LT arm   Time 4   Period Weeks   Status New   PT LONG TERM GOAL #5   Title She will score on FOTO < 20% limited  to demo improved function with less pain   Time 4   Period Weeks               Plan - 11/14/15 1001    Clinical Impression Statement No pain post session. Stil soem discomfort with movements related to swimming. Will practice tossing ball for return to tennis next week. Appears to be improved   Pt will benefit from skilled therapeutic intervention in order to improve on the following deficits Impaired UE functional use;Pain;Decreased range of motion;Decreased strength    Rehab Potential Good   PT Next Visit Plan review exercises, assess taping,  prone exercises.  [place]?   PT Home Exercise Plan counter push ups   Consulted and Agree with Plan of Care Patient        Problem List Patient Active Problem List   Diagnosis Date Noted  . Left rotator cuff tear 10/07/2015  . Hypothyroid 09/28/2015  . Leukopenia 12/08/2012  . Metatarsalgia of left foot 09/21/2011  . CERVICALGIA 05/04/2010  . ANEMIA-NOS 03/17/2009  . ASYMPTOMATIC VARICOSE VEINS 03/17/2009  . OSTEOARTHRITIS 03/17/2009  . SCOLIOSIS, MILD 03/17/2009  . BREAST CANCER, HX OF 03/17/2009    Darrel Hoover PT 11/14/2015, 10:06 AM  [facility] [address] [place], [place], [address] Phone: [phone]   Fax:  [phone]  Name: Brittany [name] MRN: [id number] Date of Birth: [birth year]

## 2015-11-14 NOTE — Patient Instructions (Signed)
Issued from cabinet counter push up and scapula protraction and retraction  X 15 reps  Daily. Also asked her to practice tossing  Diona Foley to see if improving. Also asked her to remove tape tomorrow and inform us next week if she needs tape reapplied

## 2015-11-18 ENCOUNTER — Ambulatory Visit: Payer: BLUE CROSS/BLUE SHIELD | Admitting: Physical Therapy

## 2015-11-18 DIAGNOSIS — R29898 Other symptoms and signs involving the musculoskeletal system: Secondary | ICD-10-CM | POA: Diagnosis not present

## 2015-11-18 DIAGNOSIS — M25619 Stiffness of unspecified shoulder, not elsewhere classified: Secondary | ICD-10-CM

## 2015-11-18 DIAGNOSIS — M25512 Pain in left shoulder: Secondary | ICD-10-CM

## 2015-11-18 DIAGNOSIS — R293 Abnormal posture: Secondary | ICD-10-CM

## 2015-11-18 NOTE — Therapy (Signed)
[place], [place], [address] Phone: [phone]   Fax:  [phone]  Physical Therapy Treatment  Patient Details  Name: Brittany [name] MRN: [id number] Date of Birth: 1956/08/14 Referring Provider: Hulan Saas.MD  Encounter Date: 11/18/2015      PT End of Session - 11/18/15 1420    Visit Number 4   Number of Visits 8   Date for PT Re-Evaluation 12/05/15   PT Start Time 1104   PT Stop Time 1146   PT Time Calculation (min) 42 min   Activity Tolerance Patient tolerated treatment well   Behavior During Therapy [facility] for tasks assessed/performed      Past Medical History  Diagnosis Date  . ANEMIA-NOS   . Asymptomatic varicose veins   . BREAST CANCER, HX OF 01/1995 dx    s/p Lumpectomy, XRT, chemo and 59yrarmidex  . OSTEOARTHRITIS   . SCOLIOSIS, MILD   . Leukopenia     mild since chemo  . Gestational diabetes mellitus in childbirth, diet controlled 1985  . Hypothyroid 09/28/2015    Dx 09/2015    Past Surgical History  Procedure Laterality Date  . Left lumpectomy  1996  . Tonsillectomy    . Breast biopsy    . Total hip arthroplasty      [place] & [place]   There were no vitals filed for this visit.  Visit Diagnosis:  Weakness of shoulder  Pain, joint, shoulder, left  Abnormal posture  Stiffness of shoulder joint, unspecified laterality      Subjective Assessment - 11/18/15 1109    Subjective Tape helps remind me to keep shoulders back  .  Practiced tossing the ball to serve and did not notice,  tried overhand movements and that did not hurt either.     Currently in Pain? No/denies                         [facility] 11[facility] 1113    Shoulder Exercises: Supine   Protraction 10 reps  3 LBS each   Other Supine Exercises supine shoulder circles, horizontal abduction, flexion/extension   10 x with 3 LBS barbells and head press.   Shoulder Exercises: Prone   Other Prone Exercises Tried quadriped alternatarm/leg lift but stopped with weightbearing discomfort.     Other Prone Exercises [place] 5 to 10 reps.  head off mat,  cues.  added to home  tried initially with head on mat pain reported.   Shoulder Exercises: Standing   Other Standing Exercises counter push up 5 X,  counterend range, serratus  cues needed.  5 X   Manual Therapy   Manual therapy comments taping as on 11/10/2014                  PT Education - 11/18/15 1138    Education provided Yes   Education Details             PT Short Term Goals - 11/18/15 1428    PT SHORT TERM GOAL #1   Title She will be independent with inital HEP    Baseline minor cues   Time 2   Period Weeks   Status On-going           PT Long Term Goals - 11/18/15 1428    PT LONG TERM GOAL #1   Title She will be independent with all HEP issued as  of last visit   Time 4   Period Weeks   Status On-going   PT LONG TERM GOAL #2   Title She will report pain improved 25% or more with tossing ball for tennis    Baseline no pain    Time 4   Period Weeks   Status Achieved   PT LONG TERM GOAL #3   Title She will report decreased 25% or more  pain with lying on LT shoulder    Baseline able to sleep on LT shoulder a little   Time 4   Period Weeks   Status On-going   PT LONG TERM GOAL #4   Title She will report imroved comfort with carrying bags in LT arm   Baseline she has decreased discomfort now that she shares the work with both arms.   Time 4   Period Weeks   Status On-going   PT LONG TERM GOAL #5   Title She will score on FOTO < 20% limited  to demo improved function with less pain   Time 4   Period Weeks   Status Unable to assess               Plan - 11/18/15 1421    Clinical Impression Statement Able to progress home exercise program.  Can toss tennis ball now pain free  LTG #2 met.  .  She can carry a gallon of milk in Lt hand at home, she  now uses both arms to carry vs LT only.  She can sleep Lt side a little.  Minor cues for initial home exercises.  ( press up from counter)   PT Next Visit Plan review [place] ,Y, T,  Teach H usband how to tape?     PT Home Exercise Plan [place].     Consulted and Agree with Plan of Care Patient        Problem List Patient Active Problem List   Diagnosis Date Noted  . Left rotator cuff tear 10/07/2015  . Hypothyroid 09/28/2015  . Leukopenia 12/08/2012  . Metatarsalgia of left foot 09/21/2011  . CERVICALGIA 05/04/2010  . ANEMIA-NOS 03/17/2009  . ASYMPTOMATIC VARICOSE VEINS 03/17/2009  . OSTEOARTHRITIS 03/17/2009  . SCOLIOSIS, MILD 03/17/2009  . BREAST CANCER, HX OF 03/17/2009    [facility] 11/18/2015, 2:30 PM  [facility] [address] [place], [place], [address] Phone: [phone]   Fax:  [phone]  Name: Brittany [name] MRN: [id number] Date of Birth: 1955/12/14    Melvenia Needles, PTA 11/18/2015 2:30 PM Phone: [phone] Fax: [phone]

## 2015-11-21 ENCOUNTER — Ambulatory Visit: Payer: BLUE CROSS/BLUE SHIELD | Admitting: Physical Therapy

## 2015-11-21 DIAGNOSIS — R29898 Other symptoms and signs involving the musculoskeletal system: Secondary | ICD-10-CM | POA: Diagnosis not present

## 2015-11-21 DIAGNOSIS — R293 Abnormal posture: Secondary | ICD-10-CM

## 2015-11-21 DIAGNOSIS — M25512 Pain in left shoulder: Secondary | ICD-10-CM

## 2015-11-21 DIAGNOSIS — M25619 Stiffness of unspecified shoulder, not elsewhere classified: Secondary | ICD-10-CM

## 2015-11-21 NOTE — Therapy (Addendum)
Brittany [name], [place], [address] Phone: [phone]   Fax:  [phone]  Physical Therapy Treatment  Patient Details  Name: Brittany [name] MRN: [id number] Date of Birth: 1955-10-27 Referring Provider: Hulan Saas.MD  Encounter Date: 11/21/2015      PT End of Session - 11/21/15 0935    Visit Number 5   Number of Visits 8   Date for PT Re-Evaluation 12/05/15   Authorization Type BCBS   PT Start Time [id number]   PT Stop Time 1013   PT Time Calculation (min) 40 min      Past Medical History  Diagnosis Date  . ANEMIA-NOS   . Asymptomatic varicose veins   . BREAST CANCER, HX OF 01/1995 dx    s/p Lumpectomy, XRT, chemo and 88yr armidex  . OSTEOARTHRITIS   . SCOLIOSIS, MILD   . Leukopenia     mild since chemo  . Gestational diabetes mellitus in childbirth, diet controlled 1985  . Hypothyroid 09/28/2015    Dx 09/2015    Past Surgical History  Procedure Laterality Date  . Left lumpectomy  1996  . Tonsillectomy    . Breast biopsy    . Total hip arthroplasty      2002 & 2003-Dr. vail @ [place]    There were no vitals filed for this visit.  Visit Diagnosis:  Weakness of shoulder  Pain, joint, shoulder, left  Abnormal posture  Stiffness of shoulder joint, unspecified laterality      Subjective Assessment - 11/21/15 0951    Subjective I was sore after the stomach exercises- felt in under armpits/back                         [facility] Adult PT Treatment/Exercise - 11/21/15 0001    Shoulder Exercises: Supine   Protraction 10 reps;20 reps  5lbs   Other Supine Exercises supine shoulder circles, horizontal abduction, flexion/extension   10 x with 3 LBS barbells and head press.  increased to 5 with small ROM as pt has 5lb at home   Shoulder Exercises: Prone   Other Prone Exercises [place] 5 to 10 reps.  head off mat,  cues.  added to home  cues to slow down,  hold 5 sec   Shoulder  Exercises: Sidelying   External Rotation Strengthening;Left;15 reps   External Rotation Weight (lbs) 3   ABduction 5 reps   ABduction Weight (lbs) 3   crepitus-dc   Shoulder Exercises: Standing   Extension Strengthening;Both;15 reps;Theraband   Theraband Level (Shoulder Extension) Level 4 (Blue)   Row Strengthening;Both;15 reps;Theraband   Theraband Level (Shoulder Row) Level 4 (Blue)   Other Standing Exercises blue band rockwood x 20 each   Shoulder Exercises: ROM/Strengthening   UBE (Upper Arm Bike) 3 min forw and 3 min back L2   Other ROM/Strengthening Exercises Quadruped protraction- x 10 then Alt UE/LE raise holding protraction- no discomfort today                  PT Short Term Goals - 11/21/15 0936    PT SHORT TERM GOAL #1   Title She will be independent with inital HEP    Time 2   Period Weeks   Status Achieved           PT Long Term Goals - 11/18/15 1428    PT LONG TERM GOAL #1   Title She will be independent with all HEP issued as  of last visit   Time 4   Period Weeks   Status On-going   PT LONG TERM GOAL #2   Title She will report pain improved 25% or more with tossing ball for tennis    Baseline no pain    Time 4   Period Weeks   Status Achieved   PT LONG TERM GOAL #3   Title She will report decreased 25% or more  pain with lying on LT shoulder    Baseline able to sleep on LT shoulder a little   Time 4   Period Weeks   Status On-going   PT LONG TERM GOAL #4   Title She will report imroved comfort with carrying bags in LT arm   Baseline she has decreased discomfort now that she shares the work with both arms.   Time 4   Period Weeks   Status On-going   PT LONG TERM GOAL #5   Title She will score on FOTO < 20% limited  to demo improved function with less pain   Time 4   Period Weeks   Status Unable to assess       Clinical Impression: Pt to try tennis and will discuss tolerance at next visit and probable dc or 1 more visit. 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 She does not want to rely on tape.   Plan: assess tolerance to tennis, dc?      Problem List Patient Active Problem List   Diagnosis Date Noted  . Left rotator cuff tear 10/07/2015  . Hypothyroid 09/28/2015  . Leukopenia 12/08/2012  . Metatarsalgia of left foot 09/21/2011  . CERVICALGIA 05/04/2010  . ANEMIA-NOS 03/17/2009  . ASYMPTOMATIC VARICOSE VEINS 03/17/2009  . OSTEOARTHRITIS 03/17/2009  . SCOLIOSIS, MILD 03/17/2009  . BREAST CANCER, HX OF 03/17/2009    Dorene Ar, PTA 11/21/2015, 10:33 AM  [facility] [address] [place], [place], [address] Phone: [phone]   Fax:  [phone]  Name: Brittany [name] MRN: [id number] Date of Birth: May 05, 1956

## 2015-11-25 ENCOUNTER — Ambulatory Visit: Payer: BLUE CROSS/BLUE SHIELD | Admitting: Physical Therapy

## 2015-11-25 DIAGNOSIS — R293 Abnormal posture: Secondary | ICD-10-CM

## 2015-11-25 DIAGNOSIS — M25619 Stiffness of unspecified shoulder, not elsewhere classified: Secondary | ICD-10-CM

## 2015-11-25 DIAGNOSIS — M25512 Pain in left shoulder: Secondary | ICD-10-CM

## 2015-11-25 DIAGNOSIS — R29898 Other symptoms and signs involving the musculoskeletal system: Secondary | ICD-10-CM

## 2015-11-25 NOTE — Therapy (Addendum)
[facility] [place], [place], [address] Phone: [phone]   Fax:  [phone]  Physical Therapy Treatment  Patient Details  Name: Brittany [name] MRN: [id number] Date of Birth: 03/16/1956 Referring Provider: Hulan Saas.MD  Encounter Date: 11/25/2015      PT End of Session - 11/25/15 1103    Visit Number 6   Number of Visits 8   Date for PT Re-Evaluation 12/05/15   Authorization Type BC[employer]   PT Start Time 1102   PT Stop Time 1141   PT Time Calculation (min) 39 min      Past Medical History  Diagnosis Date  . ANEMIA-NOS   . Asymptomatic varicose veins   . BREAST CANCER, HX OF 01/1995 dx    s/p Lumpectomy, XRT, chemo and 67yrarmidex  . OSTEOARTHRITIS   . SCOLIOSIS, MILD   . Leukopenia     mild since chemo  . Gestational diabetes mellitus in childbirth, diet controlled 1985  . Hypothyroid 09/28/2015    Dx 09/2015    Past Surgical History  Procedure Laterality Date  . Left lumpectomy  1996  . Tonsillectomy    . Breast biopsy    . Total hip arthroplasty      2002 & 2003-Dr. vail @ [facility]   There were no vitals filed for this visit.  Visit Diagnosis:  Weakness of shoulder  Pain, joint, shoulder, left  Abnormal posture  Stiffness of shoulder joint, unspecified laterality      Subjective Assessment - 11/25/15 1103    Subjective I played Tennis and it was fine. I still feel a little spot of pain that has not completely gone away during exercises. A little catch.    Currently in Pain? No/denies            [facility] - [facility]    Observation/Other Assessments   Focus on Therapeutic Outcomes (FOTO)  35% limited improved from 39% limited (34% predicted)   AROM   Left Shoulder Flexion 155 Degrees   Left Shoulder ABduction 170 Degrees   Left Shoulder Internal Rotation 50 Degrees   Left Shoulder External Rotation 90 Degrees   Left Shoulder Horizontal ABduction 34 Degrees    Left Shoulder Horizontal ADduction 114 Degrees   Strength   Left Shoulder Flexion 5/5   Left Shoulder Extension 5/5   Left Shoulder ABduction 4+/5   Left Shoulder Internal Rotation 5/5   Left Shoulder External Rotation 5/5   Left Shoulder Horizontal ABduction 5/5   Left Shoulder Horizontal ADduction 5/5                     [facility] Adult PT Treatment/Exercise - 11/25/15 0001    Shoulder Exercises: Supine   Protraction 10 reps;20 reps  3 lbs   Other Supine Exercises Supine press up into flexion 3# x 20   Other Supine Exercises supine shoulder circles, horizontal abduction, flexion/extension   10 x with 3 LBS barbells and head press.  increased to 5 with small ROM as pt has 5lb at home   Shoulder Exercises: Sidelying   External Rotation Strengthening;Left;15 reps   External Rotation Weight (lbs) 3   Shoulder Exercises: Standing   Extension Strengthening;Both;15 reps;Theraband   Theraband Level (Shoulder Extension) Level 4 (Blue)   Extension Limitations cues for posture   Row Strengthening;Both;15 reps;Theraband   Theraband Level (Shoulder Row) Level 4 (Blue)   Row Limitations cues for posture   Other Standing Exercises counter  push up plus x 10   Other Standing Exercises blue band rockwood x 20 each  cues for posture/head position   Shoulder Exercises: ROM/Strengthening   UBE (Upper Arm Bike) 3 min forw and 3 min back L2   Other ROM/Strengthening Exercises Quadruped protraction- x 10 then Alt UE/LE raise holding protraction- no discomfort today                  PT Short Term Goals - 11/21/15 0936    PT SHORT TERM GOAL #1   Title She will be independent with inital HEP    Time 2   Period Weeks   Status Achieved           PT Long Term Goals - 11/25/15 1115    PT LONG TERM GOAL #1   Title She will be independent with all HEP issued as of last visit   Time 4   Period Weeks   Status Achieved   PT LONG TERM GOAL #2   Title She will report pain  improved 25% or more with tossing ball for tennis    Baseline 75%   Time 4   Period Weeks   Status Achieved   PT LONG TERM GOAL #3   Title She will report decreased 25% or more  pain with lying on LT shoulder    Baseline 50% improved   Time 4   Period Weeks   Status Achieved   PT LONG TERM GOAL #4   Title She will report imroved comfort with carrying bags in LT arm   Baseline she has decreased discomfort now that she shares the work with both arms.   Time 4   Period Weeks   Status Achieved   PT LONG TERM GOAL #5   Title She will score on FOTO < 20% limited  to demo improved function with less pain   Time 4   Period Weeks   Status Achieved               Plan - 11/25/15 1142    Clinical Impression Statement All goals met. Pt reports 50% improvement in sleep positions and 75% improvement in pain with tennis. She has a catch with shoulder flexion exercises however feels she can continue idependently with HEP at this time.    PT Next Visit Plan discharge today        Problem List Patient Active Problem List   Diagnosis Date Noted  . Left rotator cuff tear 10/07/2015  . Hypothyroid 09/28/2015  . Leukopenia 12/08/2012  . Metatarsalgia of left foot 09/21/2011  . CERVICALGIA 05/04/2010  . ANEMIA-NOS 03/17/2009  . ASYMPTOMATIC VARICOSE VEINS 03/17/2009  . OSTEOARTHRITIS 03/17/2009  . SCOLIOSIS, MILD 03/17/2009  . BREAST CANCER, HX OF 03/17/2009    Dorene Ar, PTA 11/25/2015, 11:45 AM  [facility] [address] [place], [place], [address] Phone: [phone]   Fax:  [phone]  Name: Brittany [name] MRN: [id number] Date of Birth: 26-Mar-1956    PHYSICAL THERAPY DISCHARGE SUMMARY  Visits from Start of Care: 6  Current functional level related to goals / functional outcomes: See above   Remaining deficits: See above. Continues with catch but is improved   Education / Equipment: HEP   Plan: Patient agrees to discharge.  Patient goals were met. Patient is being discharged due to meeting the stated rehab goals.  ?????   [name] PT  11/25/15  5:55 PM

## 2015-11-28 ENCOUNTER — Encounter: Payer: BLUE CROSS/BLUE SHIELD | Admitting: Physical Therapy

## 2015-12-02 ENCOUNTER — Ambulatory Visit: Payer: BLUE CROSS/BLUE SHIELD | Admitting: Internal Medicine

## 2015-12-16 ENCOUNTER — Ambulatory Visit (INDEPENDENT_AMBULATORY_CARE_PROVIDER_SITE_OTHER): Payer: BLUE CROSS/BLUE SHIELD | Admitting: Family Medicine

## 2015-12-16 ENCOUNTER — Other Ambulatory Visit (INDEPENDENT_AMBULATORY_CARE_PROVIDER_SITE_OTHER): Payer: BLUE CROSS/BLUE SHIELD

## 2015-12-16 ENCOUNTER — Encounter: Payer: Self-pay | Admitting: Family Medicine

## 2015-12-16 VITALS — BP 98/62 | Ht 68.0 in | Wt 153.0 lb

## 2015-12-16 DIAGNOSIS — M75102 Unspecified rotator cuff tear or rupture of left shoulder, not specified as traumatic: Secondary | ICD-10-CM

## 2015-12-16 DIAGNOSIS — M25512 Pain in left shoulder: Secondary | ICD-10-CM

## 2015-12-16 NOTE — Assessment & Plan Note (Signed)
Patient has made considerable improvement. We discussed that we can repeat injection again if necessary at a later date. We discussed that advance imaging would be necessary to further evaluate the loose bodies in the shoulder. Patient at this point though not one any surgical intervention. Patient wants to continue with conservative therapy. We discussed his lungs patient does well she can follow-up as needed. Any worsening symptoms I do think advance imaging would be warranted.

## 2015-12-16 NOTE — Patient Instructions (Signed)
Good to see you  keep hands within peripheral vision. No heavy lifting.  Ice 20 minutes fter activity  Exercises 2 times a week.  Continue the vitamin D indefinitely.  If worsening symptoms call me and we will get MRI  Otherwise see me when you need me.

## 2015-12-16 NOTE — Progress Notes (Signed)
Pre visit review using our clinic review tool, if applicable. No additional management support is needed unless otherwise documented below in the visit note. 

## 2015-12-16 NOTE — Progress Notes (Signed)
[name] [place], [place] [address] Phone: [phone] Subjective:    I'm seeing this patient by the request  of:  Binnie Rail, MD   CC: Left shoulder pain follow-up [id number] Brittany [name] is a 60 y.o. female coming in with complaint of left shoulder pain. Patient is very active lifting weights, swimming as well as play tennis.  Patient seen though and did have a near full-thickness tear of the rotator cuff as well as significant subacromial bursitis. Because the patient's history of breast cancer patient did have x-rays of her shoulder. These were independently visualized  showing patient did have possible loose bodies within the shoulder. Patient had been doing much better. States that physical therapy was doing significantly better. Started having increasing discomfort over the course last couple weeks. Patient has been doing more weight lifting low. Except as congenitally. No longer hurting her when she plays tennis or place golf. Denies any weakness. States that the strength has improved overall.  Past Medical History  Diagnosis Date  . ANEMIA-NOS   . Asymptomatic varicose veins   . BREAST CANCER, HX OF 01/1995 dx    s/p Lumpectomy, XRT, chemo and 14yr armidex  . OSTEOARTHRITIS   . SCOLIOSIS, MILD   . Leukopenia     mild since chemo  . Gestational diabetes mellitus in childbirth, diet controlled 1985  . Hypothyroid 09/28/2015    Dx 09/2015   Past Surgical History  Procedure Laterality Date  . Left lumpectomy  1996  . Tonsillectomy    . Breast biopsy    . Total hip arthroplasty      2002 & 2003-Dr. vail @ [facility]   Social History  Substance Use Topics  . Smoking status: Former Smoker    Quit date: 06/25/1977  . Smokeless tobacco: None  . Alcohol Use: No   No Known Allergies   Family History  Problem Relation Age of Onset  . Arthritis Mother   . Breast cancer Mother 63  . Hyperlipidemia Mother   . Heart disease  Father 30    AMI  . Hyperlipidemia Father   . Valvular heart disease Father 70  . Parkinson's disease Father 57  . Diabetes Paternal Grandfather     type 2  . Pancreatic cancer Maternal Grandmother 73    presumed dx        Past medical history, social, surgical and family history all reviewed in electronic medical record.   Review of Systems: No headache, visual changes, nausea, vomiting, diarrhea, constipation, dizziness, abdominal pain, skin rash, fevers, chills, night sweats, weight loss, swollen lymph nodes, body aches, joint swelling, muscle aches, chest pain, shortness of breath, mood changes.   Objective Blood pressure 98/62, height 5\' 8"  (1.727 m), weight 153 lb (69.4 kg).  General: No apparent distress alert and oriented x3 mood and affect normal, dressed appropriately.  HEENT: Pupils equal, extraocular movements intact  Respiratory: Patient's speak in full sentences and does not appear short of breath  Cardiovascular: No lower extremity edema, non tender, no erythema  Skin: Warm dry intact with no signs of infection or rash on extremities or on axial skeleton.  Abdomen: Soft nontender  Neuro: Cranial nerves II through XII are intact, neurovascularly intact in all extremities with 2+ DTRs and 2+ pulses.  Lymph: No lymphadenopathy of posterior or anterior cervical chain or axillae bilaterally.  Gait normal with good balance and coordination.  MSK:  Non tender with full range of motion  and good stability and symmetric strength and tone of  elbows, wrist, hip, knee and ankles bilaterally. Patient does have kyphosis with some mild scoliosis of the thoracolumbar junction. Shoulder: left Inspection reveals no abnormalities, atrophy or asymmetry. Palpation is normal with no tenderness over AC joint or bicipital groove. ROM is full in all planes passively. Bilaterally strength compared to the contralateral side. Significant improvement. Multiple impingement sign still remaining    Speeds and Yergason's tests normal. No labral pathology noted with negative Obrien's, negative clunk and good stability. Normal scapular function observed. No painful arc and no drop arm sign. No apprehension sign Contralateral shoulder unremarkable.      Impression and Recommendations:     This case required medical decision making of moderate complexity.

## 2015-12-23 ENCOUNTER — Other Ambulatory Visit (INDEPENDENT_AMBULATORY_CARE_PROVIDER_SITE_OTHER): Payer: BLUE CROSS/BLUE SHIELD

## 2015-12-23 DIAGNOSIS — E038 Other specified hypothyroidism: Secondary | ICD-10-CM

## 2015-12-23 LAB — TSH: TSH: 2.58 u[IU]/mL (ref 0.35–4.50)

## 2016-01-12 DIAGNOSIS — Z96643 Presence of artificial hip joint, bilateral: Secondary | ICD-10-CM | POA: Insufficient documentation

## 2016-03-15 ENCOUNTER — Other Ambulatory Visit: Payer: Self-pay | Admitting: *Deleted

## 2016-03-15 MED ORDER — LEVOTHYROXINE SODIUM 50 MCG PO TABS: 50.0000 ug | ORAL_TABLET | Freq: Every day | ORAL | Status: DC

## 2016-03-15 NOTE — Telephone Encounter (Signed)
Pt needing refill on her levothyroxine. Verified pharmacy inform sending electronically...Brittany [name]

## 2016-04-28 DIAGNOSIS — Z01419 Encounter for gynecological examination (general) (routine) without abnormal findings: Secondary | ICD-10-CM | POA: Diagnosis not present

## 2016-04-28 DIAGNOSIS — Z1151 Encounter for screening for human papillomavirus (HPV): Secondary | ICD-10-CM | POA: Diagnosis not present

## 2016-04-28 DIAGNOSIS — Z6822 Body mass index (BMI) 22.0-22.9, adult: Secondary | ICD-10-CM | POA: Diagnosis not present

## 2016-04-28 LAB — HM PAP SMEAR

## 2016-05-24 DIAGNOSIS — H25813 Combined forms of age-related cataract, bilateral: Secondary | ICD-10-CM | POA: Diagnosis not present

## 2016-05-24 DIAGNOSIS — H40013 Open angle with borderline findings, low risk, bilateral: Secondary | ICD-10-CM | POA: Diagnosis not present

## 2016-07-19 DIAGNOSIS — Z23 Encounter for immunization: Secondary | ICD-10-CM | POA: Diagnosis not present

## 2016-08-09 DIAGNOSIS — H60503 Unspecified acute noninfective otitis externa, bilateral: Secondary | ICD-10-CM | POA: Diagnosis not present

## 2016-08-30 ENCOUNTER — Other Ambulatory Visit: Payer: Self-pay | Admitting: Internal Medicine

## 2016-09-09 ENCOUNTER — Other Ambulatory Visit: Payer: Self-pay | Admitting: *Deleted

## 2016-09-09 MED ORDER — LEVOTHYROXINE SODIUM 50 MCG PO TABS: 50.0000 ug | ORAL_TABLET | Freq: Every day | ORAL | 0 refills | Status: DC

## 2016-09-09 NOTE — Telephone Encounter (Signed)
Rec'd call pt states she is needing refill on her Levothyroxine. Have her cpx schedule on 11/28, but will be out of med in 4 days. Inform pt can send enough in to her local pharmacy until appt. Sent to [facility]...Johny Chess

## 2016-09-13 ENCOUNTER — Encounter: Payer: Self-pay | Admitting: Sports Medicine

## 2016-09-13 ENCOUNTER — Ambulatory Visit (INDEPENDENT_AMBULATORY_CARE_PROVIDER_SITE_OTHER): Payer: BLUE CROSS/BLUE SHIELD | Admitting: Sports Medicine

## 2016-09-13 DIAGNOSIS — M7741 Metatarsalgia, right foot: Secondary | ICD-10-CM | POA: Diagnosis not present

## 2016-09-13 DIAGNOSIS — M7742 Metatarsalgia, left foot: Secondary | ICD-10-CM | POA: Diagnosis not present

## 2016-09-13 NOTE — Progress Notes (Signed)
Chief complaint -- right forefoot pain  Patient has been in metatarsal pads for many years I saw her placed her in a metatarsal pad for Morton's neuroma symptoms Does have resolved on the left when she uses pads  Now having some pain in the right forefoot Plantar surface Radiates out into toes 2, 3 and 4  Past history No diabetes mellitus History of breast cancer No peripheral neuropathy  Social history Continues to play golf and tennis Walk regularly Former smoker  Review of systems Denies numbness or tingling No swelling of either foot  Physical examination Thin female in no acute distress BP 137/84   Pulse 62   Ht 5\' 8"  (1.727 m)   Wt 151 lb (68.5 kg)   BMI 22.96 kg/m   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

## 2016-09-13 NOTE — Assessment & Plan Note (Signed)
Patient has evidence of transverse arch breakdown Bunionette Now she is getting metatarsals symptoms bilaterally  Today we added metatarsal padding We made sports insoles with additional medium metatarsal padding  She we'll try this strategy over the next few months and see if it is helpful

## 2016-09-13 NOTE — Patient Instructions (Signed)
Continue to work on your [employer] have bilateral transverse arch collapse  For balance 1 foot stand Work toward eyes closed  Stand and reach  1 leg minisquats  [employer] makes metatarsal pads - you measure a medium but small is OK Sports insoles from there as well

## 2016-09-21 ENCOUNTER — Encounter: Payer: Self-pay | Admitting: Internal Medicine

## 2016-09-21 ENCOUNTER — Ambulatory Visit (INDEPENDENT_AMBULATORY_CARE_PROVIDER_SITE_OTHER): Payer: BLUE CROSS/BLUE SHIELD | Admitting: Internal Medicine

## 2016-09-21 VITALS — BP 112/72 | HR 77 | Temp 97.7°F | Resp 16 | Ht 68.0 in | Wt 149.0 lb

## 2016-09-21 DIAGNOSIS — Z853 Personal history of malignant neoplasm of breast: Secondary | ICD-10-CM | POA: Diagnosis not present

## 2016-09-21 DIAGNOSIS — D72819 Decreased white blood cell count, unspecified: Secondary | ICD-10-CM | POA: Diagnosis not present

## 2016-09-21 DIAGNOSIS — E039 Hypothyroidism, unspecified: Secondary | ICD-10-CM

## 2016-09-21 DIAGNOSIS — Z0000 Encounter for general adult medical examination without abnormal findings: Secondary | ICD-10-CM | POA: Diagnosis not present

## 2016-09-21 DIAGNOSIS — I7300 Raynaud's syndrome without gangrene: Secondary | ICD-10-CM | POA: Insufficient documentation

## 2016-09-21 NOTE — Progress Notes (Signed)
Pre visit review using our clinic review tool, if applicable. No additional management support is needed unless otherwise documented below in the visit note. 

## 2016-09-21 NOTE — Assessment & Plan Note (Signed)
Check cbc - no longer following with hematolgy

## 2016-09-21 NOTE — Patient Instructions (Addendum)
Test(s) ordered today. Your results will be released to MyChart (or called to you) after review, usually within 72hours after test completion. If any changes need to be made, you will be notified at that same time.  All other Health Maintenance issues reviewed.   All recommended immunizations and age-appropriate screenings are up-to-date or discussed.  No immunizations administered today.   Medications reviewed and updated.  No changes recommended at this time.   Please followup in one year for a physical   Health Maintenance, Female Introduction Adopting a healthy lifestyle and getting preventive care can go a long way to promote health and wellness. Talk with your health care provider about what schedule of regular examinations is right for you. This is a good chance for you to check in with your provider about disease prevention and staying healthy. In between checkups, there are plenty of things you can do on your own. Experts have done a lot of research about which lifestyle changes and preventive measures are most likely to keep you healthy. Ask your health care provider for more information. Weight and diet Eat a healthy diet  Be sure to include plenty of vegetables, fruits, low-fat dairy products, and lean protein.  Do not eat a lot of foods high in solid fats, added sugars, or salt.  Get regular exercise. This is one of the most important things you can do for your health.  Most adults should exercise for at least 150 minutes each week. The exercise should increase your heart rate and make you sweat (moderate-intensity exercise).  Most adults should also do strengthening exercises at least twice a week. This is in addition to the moderate-intensity exercise. Maintain a healthy weight  Body mass index (BMI) is a measurement that can be used to identify possible weight problems. It estimates body fat based on height and weight. Your health care provider can help determine your  BMI and help you achieve or maintain a healthy weight.  For females 20 years of age and older:  A BMI below 18.5 is considered underweight.  A BMI of 18.5 to 24.9 is normal.  A BMI of 25 to 29.9 is considered overweight.  A BMI of 30 and above is considered obese. Watch levels of cholesterol and blood lipids  You should start having your blood tested for lipids and cholesterol at 62 years of age, then have this test every 5 years.  You may need to have your cholesterol levels checked more often if:  Your lipid or cholesterol levels are high.  You are older than 62 years of age.  You are at high risk for heart disease. Cancer screening Lung Cancer  Lung cancer screening is recommended for adults 55-80 years old who are at high risk for lung cancer because of a history of smoking.  A yearly low-dose CT scan of the lungs is recommended for people who:  Currently smoke.  Have quit within the past 15 years.  Have at least a 30-pack-year history of smoking. A pack year is smoking an average of one pack of cigarettes a day for 1 year.  Yearly screening should continue until it has been 15 years since you quit.  Yearly screening should stop if you develop a health problem that would prevent you from having lung cancer treatment. Breast Cancer  Practice breast self-awareness. This means understanding how your breasts normally appear and feel.  It also means doing regular breast self-exams. Let your health care provider know about any changes,   no matter how small.  If you are in your 20s or 30s, you should have a clinical breast exam (CBE) by a health care provider every 1-3 years as part of a regular health exam.  If you are 40 or older, have a CBE every year. Also consider having a breast X-ray (mammogram) every year.  If you have a family history of breast cancer, talk to your health care provider about genetic screening.  If you are at high risk for breast cancer, talk to  your health care provider about having an MRI and a mammogram every year.  Breast cancer gene (BRCA) assessment is recommended for women who have family members with BRCA-related cancers. BRCA-related cancers include:  Breast.  Ovarian.  Tubal.  Peritoneal cancers.  Results of the assessment will determine the need for genetic counseling and BRCA1 and BRCA2 testing. Cervical Cancer  Your health care provider may recommend that you be screened regularly for cancer of the pelvic organs (ovaries, uterus, and vagina). This screening involves a pelvic examination, including checking for microscopic changes to the surface of your cervix (Pap test). You may be encouraged to have this screening done every 3 years, beginning at age 21.  For women ages 30-65, health care providers may recommend pelvic exams and Pap testing every 3 years, or they may recommend the Pap and pelvic exam, combined with testing for human papilloma virus (HPV), every 5 years. Some types of HPV increase your risk of cervical cancer. Testing for HPV may also be done on women of any age with unclear Pap test results.  Other health care providers may not recommend any screening for nonpregnant women who are considered low risk for pelvic cancer and who do not have symptoms. Ask your health care provider if a screening pelvic exam is right for you.  If you have had past treatment for cervical cancer or a condition that could lead to cancer, you need Pap tests and screening for cancer for at least 20 years after your treatment. If Pap tests have been discontinued, your risk factors (such as having a new sexual partner) need to be reassessed to determine if screening should resume. Some women have medical problems that increase the chance of getting cervical cancer. In these cases, your health care provider may recommend more frequent screening and Pap tests. Colorectal Cancer  This type of cancer can be detected and often  prevented.  Routine colorectal cancer screening usually begins at 62 years of age and continues through 62 years of age.  Your health care provider may recommend screening at an earlier age if you have risk factors for colon cancer.  Your health care provider may also recommend using home test kits to check for hidden blood in the stool.  A small camera at the end of a tube can be used to examine your colon directly (sigmoidoscopy or colonoscopy). This is done to check for the earliest forms of colorectal cancer.  Routine screening usually begins at age 50.  Direct examination of the colon should be repeated every 5-10 years through 62 years of age. However, you may need to be screened more often if early forms of precancerous polyps or small growths are found. Skin Cancer  Check your skin from head to toe regularly.  Tell your health care provider about any new moles or changes in moles, especially if there is a change in a mole's shape or color.  Also tell your health care provider if you have a   a mole that is larger than the size of a pencil eraser.  Always use sunscreen. Apply sunscreen liberally and repeatedly throughout the day.  Protect yourself by wearing long sleeves, pants, a wide-brimmed hat, and sunglasses whenever you are outside. Heart disease, diabetes, and high blood pressure  High blood pressure causes heart disease and increases the risk of stroke. High blood pressure is more likely to develop in:  People who have blood pressure in the high end of the normal range (130-139/85-89 mm Hg).  People who are overweight or obese.  People who are African American.  If you are 18-39 years of age, have your blood pressure checked every 3-5 years. If you are 40 years of age or older, have your blood pressure checked every year. You should have your blood pressure measured twice-once when you are at a hospital or clinic, and once when you are not at a hospital or clinic. Record  the average of the two measurements. To check your blood pressure when you are not at a hospital or clinic, you can use:  An automated blood pressure machine at a pharmacy.  A home blood pressure monitor.  If you are between 55 years and 79 years old, ask your health care provider if you should take aspirin to prevent strokes.  Have regular diabetes screenings. This involves taking a blood sample to check your fasting blood sugar level.  If you are at a normal weight and have a low risk for diabetes, have this test once every three years after 62 years of age.  If you are overweight and have a high risk for diabetes, consider being tested at a younger age or more often. Preventing infection Hepatitis B  If you have a higher risk for hepatitis B, you should be screened for this virus. You are considered at high risk for hepatitis B if:  You were born in a country where hepatitis B is common. Ask your health care provider which countries are considered high risk.  Your parents were born in a high-risk country, and you have not been immunized against hepatitis B (hepatitis B vaccine).  You have HIV or AIDS.  You use needles to inject street drugs.  You live with someone who has hepatitis B.  You have had sex with someone who has hepatitis B.  You get hemodialysis treatment.  You take certain medicines for conditions, including cancer, organ transplantation, and autoimmune conditions. Hepatitis C  Blood testing is recommended for:  Everyone born from 1945 through 1965.  Anyone with known risk factors for hepatitis C. Sexually transmitted infections (STIs)  You should be screened for sexually transmitted infections (STIs) including gonorrhea and chlamydia if:  You are sexually active and are younger than 62 years of age.  You are older than 62 years of age and your health care provider tells you that you are at risk for this type of infection.  Your sexual activity has  changed since you were last screened and you are at an increased risk for chlamydia or gonorrhea. Ask your health care provider if you are at risk.  If you do not have HIV, but are at risk, it may be recommended that you take a prescription medicine daily to prevent HIV infection. This is called pre-exposure prophylaxis (PrEP). You are considered at risk if:  You are sexually active and do not regularly use condoms or know the HIV status of your partner(s).  You take drugs by injection.  You are sexually   active with a partner who has HIV. Talk with your health care provider about whether you are at high risk of being infected with HIV. If you choose to begin PrEP, you should first be tested for HIV. You should then be tested every 3 months for as long as you are taking PrEP. Pregnancy  If you are premenopausal and you may become pregnant, ask your health care provider about preconception counseling.  If you may become pregnant, take 400 to 800 micrograms (mcg) of folic acid every day.  If you want to prevent pregnancy, talk to your health care provider about birth control (contraception). Osteoporosis and menopause  Osteoporosis is a disease in which the bones lose minerals and strength with aging. This can result in serious bone fractures. Your risk for osteoporosis can be identified using a bone density scan.  If you are 74 years of age or older, or if you are at risk for osteoporosis and fractures, ask your health care provider if you should be screened.  Ask your health care provider whether you should take a calcium or vitamin D supplement to lower your risk for osteoporosis.  Menopause may have certain physical symptoms and risks.  Hormone replacement therapy may reduce some of these symptoms and risks. Talk to your health care provider about whether hormone replacement therapy is right for you. Follow these instructions at home:  Schedule regular health, dental, and eye  exams.  Stay current with your immunizations.  Do not use any tobacco products including cigarettes, chewing tobacco, or electronic cigarettes.  If you are pregnant, do not drink alcohol.  If you are breastfeeding, limit how much and how often you drink alcohol.  Limit alcohol intake to no more than 1 drink per day for nonpregnant women. One drink equals 12 ounces of beer, 5 ounces of wine, or 1 ounces of hard liquor.  Do not use street drugs.  Do not share needles.  Ask your health care provider for help if you need support or information about quitting drugs.  Tell your health care provider if you often feel depressed.  Tell your health care provider if you have ever been abused or do not feel safe at home. This information is not intended to replace advice given to you by your health care provider. Make sure you discuss any questions you have with your health care provider. Document Released: 04/26/2011 Document Revised: 03/18/2016 Document Reviewed: 07/15/2015  2017 [employer]

## 2016-09-21 NOTE — Assessment & Plan Note (Signed)
Check tsh  Titrate med dose if needed  

## 2016-09-21 NOTE — Progress Notes (Signed)
Subjective:    Patient ID: Brittany [name], female    DOB: 1956/10/18, 60 y.o.   MRN: [id number]  HPI She is here for a physical exam.   Left rotator cuff tear: she has a partial tear and has seen Dr Tamala Julian.  She has done PT. There has been some improvement, but she still has pain.  She does not want to have surgery.   Hypothyroidism:  She is taking her medication daily.  She denies any recent changes in energy or weight that are unexplained.   She swims, pilates, weights, tennis, elliptical.   Medications and allergies reviewed with patient and updated if appropriate.  Patient Active Problem List   Diagnosis Date Noted  . Left rotator cuff tear 10/07/2015  . Hypothyroid 09/28/2015  . Leukopenia 12/08/2012  . Metatarsalgia of both feet 09/21/2011  . CERVICALGIA 05/04/2010  . ANEMIA-NOS 03/17/2009  . ASYMPTOMATIC VARICOSE VEINS 03/17/2009  . OSTEOARTHRITIS 03/17/2009  . SCOLIOSIS, MILD 03/17/2009  . BREAST CANCER, HX OF 03/17/2009    Current Outpatient Prescriptions on File Prior to Visit  Medication Sig Dispense Refill  . Calcium Carbonate-Vitamin D (CALCIUM 600-D) 600-400 MG-UNIT per tablet Take 1 tablet by mouth 4 (four) times daily.      . Calcium Citrate 250 MG TABS Take 4 tablets (1,000 mg total) by mouth daily.  0  . Cholecalciferol (VITAMIN D3) 1000 UNITS CAPS Take by mouth daily.      . Diclofenac Sodium (PENNSAID) 2 % SOLN Apply topically two times a day as needed 112 g 0  . levothyroxine (SYNTHROID, LEVOTHROID) 50 MCG tablet Take 1 tablet (50 mcg total) by mouth daily. Pls keep Nov 28th appt for future refills 10 tablet 0  . Multiple Vitamin (MULTIVITAMIN) tablet Take 1 tablet by mouth daily.      . Omega-3 Fatty Acids (FISH OIL) 1000 MG CPDR Take by mouth daily.       No current facility-administered medications on file prior to visit.     Past Medical History:  Diagnosis Date  . ANEMIA-NOS   . Asymptomatic varicose veins   . BREAST CANCER, HX OF 01/1995 dx    s/p Lumpectomy, XRT, chemo and 72yr armidex  . Gestational diabetes mellitus in childbirth, diet controlled 1985  . Hypothyroid 09/28/2015   Dx 09/2015  . Leukopenia    mild since chemo  . OSTEOARTHRITIS   . SCOLIOSIS, MILD     Past Surgical History:  Procedure Laterality Date  . BREAST BIOPSY    . left lumpectomy  1996  . TONSILLECTOMY    . TOTAL HIP ARTHROPLASTY     2002 & 2003-Dr. vail @ [place]    Social History   Social History  . Marital status: Married    Spouse name: N/A  . Number of children: N/A  . Years of education: N/A   Social History Main Topics  . Smoking status: Former Smoker    Quit date: 06/25/1977  . Smokeless tobacco: Not on file  . Alcohol use No  . Drug use: No  . Sexual activity: Not on file   Other Topics Concern  . Not on file   Social History Narrative   Married-lives with spouse & son. Art gallery manager, enjoys golf, sports, bridge, swim, and walking dog    Family History  Problem Relation Age of Onset  . Arthritis Mother   . Breast cancer Mother 21  . Hyperlipidemia Mother   . Heart disease Father 5  AMI  . Hyperlipidemia Father   . Valvular heart disease Father 74  . Parkinson's disease Father 43  . Diabetes Paternal Grandfather     type 2  . Pancreatic cancer Maternal Grandmother 73    presumed dx    Review of Systems  Constitutional: Negative for appetite change, chills, fatigue, fever and unexpected weight change.  HENT: Negative for hearing loss.   Eyes: Negative for visual disturbance.  Respiratory: Negative for cough, shortness of breath and wheezing.   Cardiovascular: Negative for chest pain, palpitations and leg swelling.  Gastrointestinal: Negative for abdominal pain, blood in stool, diarrhea and nausea.       No gerd  Endocrine: Positive for cold intolerance.  Genitourinary: Negative for dysuria and hematuria.  Musculoskeletal: Positive for arthralgias (left shoulder). Negative for back pain.  Skin: Negative  for color change and rash.  Neurological: Negative for dizziness, light-headedness and headaches.  Psychiatric/Behavioral: Negative for dysphoric mood. The patient is not nervous/anxious.        Objective:   Vitals:   09/21/16 1406  BP: 112/72  Pulse: 77  Resp: 16  Temp: 97.7 F (36.5 C)   Filed Weights   09/21/16 1406  Weight: 149 lb (67.6 kg)   Body mass index is 22.66 kg/m.   Physical Exam Constitutional: She appears well-developed and well-nourished. No distress.  HENT:  Head: Normocephalic and atraumatic.  Right Ear: External ear normal. Normal ear canal and TM Left Ear: External ear normal.  Normal ear canal and TM Mouth/Throat: Oropharynx is clear and moist.  Eyes: Conjunctivae and EOM are normal.  Neck: Neck supple. No tracheal deviation present. No thyromegaly present.  No carotid bruit  Cardiovascular: Normal rate, regular rhythm and normal heart sounds.   No murmur heard.  No edema. Pulmonary/Chest: Effort normal and breath sounds normal. No respiratory distress. She has no wheezes. She has no rales.  Breast: deferred to Gyn Abdominal: Soft. She exhibits no distension. There is no tenderness.  Lymphadenopathy: She has no cervical adenopathy.  Skin: Skin is warm and dry. She is not diaphoretic.  Psychiatric: She has a normal mood and affect. Her behavior is normal.        Assessment & Plan:   Physical exam: Screening blood work  ordered Immunizations  Up to date  Colonoscopy  Up to date  Mammogram  Up to date  Gyn  Up to date  Dexa  Up to date  Eye exams  Up to date  Exercise - regular Weight - normal BMI Skin  - sees derm annually Substance abuse - none  See Problem List for Assessment and Plan of chronic medical problems.  F/u annually

## 2016-09-21 NOTE — Assessment & Plan Note (Signed)
No evidence of recurrence Sees gyn, no longer sees oncology Gets mammo annually MRI annually  - not covered by insurance - she pays for it

## 2016-09-22 ENCOUNTER — Other Ambulatory Visit (INDEPENDENT_AMBULATORY_CARE_PROVIDER_SITE_OTHER): Payer: BLUE CROSS/BLUE SHIELD

## 2016-09-22 DIAGNOSIS — Z0000 Encounter for general adult medical examination without abnormal findings: Secondary | ICD-10-CM

## 2016-09-22 LAB — CBC WITH DIFFERENTIAL/PLATELET
Basophils Absolute: 0 10*3/uL (ref 0.0–0.1)
Basophils Relative: 0.6 % (ref 0.0–3.0)
Eosinophils Absolute: 0.1 10*3/uL (ref 0.0–0.7)
Eosinophils Relative: 2.8 % (ref 0.0–5.0)
HCT: 38.3 % (ref 36.0–46.0)
Hemoglobin: 13.1 g/dL (ref 12.0–15.0)
Lymphocytes Relative: 35.4 % (ref 12.0–46.0)
Lymphs Abs: 1.2 10*3/uL (ref 0.7–4.0)
MCHC: 34.2 g/dL (ref 30.0–36.0)
MCV: 94.3 fl (ref 78.0–100.0)
Monocytes Absolute: 0.3 10*3/uL (ref 0.1–1.0)
Monocytes Relative: 9.2 % (ref 3.0–12.0)
Neutro Abs: 1.8 10*3/uL (ref 1.4–7.7)
Neutrophils Relative %: 52 % (ref 43.0–77.0)
Platelets: 229 10*3/uL (ref 150.0–400.0)
RBC: 4.06 Mil/uL (ref 3.87–5.11)
RDW: 12.4 % (ref 11.5–15.5)
WBC: 3.4 10*3/uL — ABNORMAL LOW (ref 4.0–10.5)

## 2016-09-22 LAB — COMPREHENSIVE METABOLIC PANEL
ALT: 20 U/L (ref 0–35)
AST: 17 U/L (ref 0–37)
Albumin: 4.1 g/dL (ref 3.5–5.2)
Alkaline Phosphatase: 48 U/L (ref 39–117)
BUN: 12 mg/dL (ref 6–23)
CO2: 30 mEq/L (ref 19–32)
Calcium: 9.5 mg/dL (ref 8.4–10.5)
Chloride: 102 mEq/L (ref 96–112)
Creatinine, Ser: 0.92 mg/dL (ref 0.40–1.20)
GFR: 65.65 mL/min (ref >60.00)
Glucose, Bld: 93 mg/dL (ref 70–99)
Potassium: 4 mEq/L (ref 3.5–5.1)
Sodium: 139 mEq/L (ref 135–145)
Total Bilirubin: 0.5 mg/dL (ref 0.2–1.2)
Total Protein: 6.9 g/dL (ref 6.0–8.3)

## 2016-09-22 LAB — LIPID PANEL
Cholesterol: 172 mg/dL (ref 0–200)
HDL: 77.9 mg/dL (ref >39.00)
LDL Cholesterol: 86 mg/dL (ref 0–99)
NonHDL: 94.13
Total CHOL/HDL Ratio: 2
Triglycerides: 43 mg/dL (ref 0.0–149.0)
VLDL: 8.6 mg/dL (ref 0.0–40.0)

## 2016-09-22 LAB — TSH: TSH: 3.69 u[IU]/mL (ref 0.35–4.50)

## 2016-09-23 ENCOUNTER — Encounter: Payer: Self-pay | Admitting: Internal Medicine

## 2016-09-30 ENCOUNTER — Encounter: Payer: Self-pay | Admitting: Internal Medicine

## 2016-10-06 ENCOUNTER — Other Ambulatory Visit: Payer: Self-pay | Admitting: Internal Medicine

## 2016-10-06 DIAGNOSIS — Z1231 Encounter for screening mammogram for malignant neoplasm of breast: Secondary | ICD-10-CM

## 2016-11-15 ENCOUNTER — Ambulatory Visit
Admission: RE | Admit: 2016-11-15 | Discharge: 2016-11-15 | Disposition: A | Discharge status: Home / Self Care | Payer: BLUE CROSS/BLUE SHIELD | Source: Ambulatory Visit | Attending: Internal Medicine | Admitting: Internal Medicine

## 2016-11-15 DIAGNOSIS — Z1231 Encounter for screening mammogram for malignant neoplasm of breast: Secondary | ICD-10-CM

## 2017-02-16 DIAGNOSIS — L243 Irritant contact dermatitis due to cosmetics: Secondary | ICD-10-CM | POA: Diagnosis not present

## 2017-03-02 ENCOUNTER — Other Ambulatory Visit: Payer: Self-pay | Admitting: Internal Medicine

## 2017-03-08 ENCOUNTER — Other Ambulatory Visit: Payer: Self-pay | Admitting: Internal Medicine

## 2017-03-08 MED ORDER — LEVOTHYROXINE SODIUM 50 MCG PO TABS: 50.0000 ug | ORAL_TABLET | Freq: Every day | ORAL | 2 refills | Status: DC

## 2017-05-25 DIAGNOSIS — H2513 Age-related nuclear cataract, bilateral: Secondary | ICD-10-CM | POA: Diagnosis not present

## 2017-05-25 DIAGNOSIS — H40013 Open angle with borderline findings, low risk, bilateral: Secondary | ICD-10-CM | POA: Diagnosis not present

## 2017-05-25 DIAGNOSIS — H44413 Flat anterior chamber hypotony of eye, bilateral: Secondary | ICD-10-CM | POA: Diagnosis not present

## 2017-06-06 ENCOUNTER — Encounter: Payer: Self-pay | Admitting: Internal Medicine

## 2017-06-06 MED ORDER — LEVOTHYROXINE SODIUM 50 MCG PO TABS: 50.0000 ug | ORAL_TABLET | Freq: Every day | ORAL | 0 refills | Status: DC

## 2017-07-18 ENCOUNTER — Ambulatory Visit (INDEPENDENT_AMBULATORY_CARE_PROVIDER_SITE_OTHER): Payer: BLUE CROSS/BLUE SHIELD

## 2017-07-18 DIAGNOSIS — Z23 Encounter for immunization: Secondary | ICD-10-CM | POA: Diagnosis not present

## 2017-08-19 DIAGNOSIS — D1801 Hemangioma of skin and subcutaneous tissue: Secondary | ICD-10-CM | POA: Diagnosis not present

## 2017-08-19 DIAGNOSIS — L570 Actinic keratosis: Secondary | ICD-10-CM | POA: Diagnosis not present

## 2017-08-19 DIAGNOSIS — D2262 Melanocytic nevi of left upper limb, including shoulder: Secondary | ICD-10-CM | POA: Diagnosis not present

## 2017-08-19 DIAGNOSIS — L821 Other seborrheic keratosis: Secondary | ICD-10-CM | POA: Diagnosis not present

## 2017-09-20 ENCOUNTER — Encounter: Payer: Self-pay | Admitting: Internal Medicine

## 2017-09-20 NOTE — Patient Instructions (Addendum)
Test(s) ordered today. Your results will be released to MyChart (or called to you) after review, usually within 72hours after test completion. If any changes need to be made, you will be notified at that same time.  All other Health Maintenance issues reviewed.   All recommended immunizations and age-appropriate screenings are up-to-date or discussed.  No immunizations administered today.   Medications reviewed and updated.  No changes recommended at this time.    Please followup in one year   Health Maintenance, Female Adopting a healthy lifestyle and getting preventive care can go a long way to promote health and wellness. Talk with your health care provider about what schedule of regular examinations is right for you. This is a good chance for you to check in with your provider about disease prevention and staying healthy. In between checkups, there are plenty of things you can do on your own. Experts have done a lot of research about which lifestyle changes and preventive measures are most likely to keep you healthy. Ask your health care provider for more information. Weight and diet Eat a healthy diet  Be sure to include plenty of vegetables, fruits, low-fat dairy products, and lean protein.  Do not eat a lot of foods high in solid fats, added sugars, or salt.  Get regular exercise. This is one of the most important things you can do for your health. ? Most adults should exercise for at least 150 minutes each week. The exercise should increase your heart rate and make you sweat (moderate-intensity exercise). ? Most adults should also do strengthening exercises at least twice a week. This is in addition to the moderate-intensity exercise.  Maintain a healthy weight  Body mass index (BMI) is a measurement that can be used to identify possible weight problems. It estimates body fat based on height and weight. Your health care provider can help determine your BMI and help you achieve  or maintain a healthy weight.  For females 20 years of age and older: ? A BMI below 18.5 is considered underweight. ? A BMI of 18.5 to 24.9 is normal. ? A BMI of 25 to 29.9 is considered overweight. ? A BMI of 30 and above is considered obese.  Watch levels of cholesterol and blood lipids  You should start having your blood tested for lipids and cholesterol at 63 years of age, then have this test every 5 years.  You may need to have your cholesterol levels checked more often if: ? Your lipid or cholesterol levels are high. ? You are older than 63 years of age. ? You are at high risk for heart disease.  Cancer screening Lung Cancer  Lung cancer screening is recommended for adults 55-80 years old who are at high risk for lung cancer because of a history of smoking.  A yearly low-dose CT scan of the lungs is recommended for people who: ? Currently smoke. ? Have quit within the past 15 years. ? Have at least a 30-pack-year history of smoking. A pack year is smoking an average of one pack of cigarettes a day for 1 year.  Yearly screening should continue until it has been 15 years since you quit.  Yearly screening should stop if you develop a health problem that would prevent you from having lung cancer treatment.  Breast Cancer  Practice breast self-awareness. This means understanding how your breasts normally appear and feel.  It also means doing regular breast self-exams. Let your health care provider know about any   changes, no matter how small.  If you are in your 20s or 30s, you should have a clinical breast exam (CBE) by a health care provider every 1-3 years as part of a regular health exam.  If you are 40 or older, have a CBE every year. Also consider having a breast X-ray (mammogram) every year.  If you have a family history of breast cancer, talk to your health care provider about genetic screening.  If you are at high risk for breast cancer, talk to your health care  provider about having an MRI and a mammogram every year.  Breast cancer gene (BRCA) assessment is recommended for women who have family members with BRCA-related cancers. BRCA-related cancers include: ? Breast. ? Ovarian. ? Tubal. ? Peritoneal cancers.  Results of the assessment will determine the need for genetic counseling and BRCA1 and BRCA2 testing.  Cervical Cancer Your health care provider may recommend that you be screened regularly for cancer of the pelvic organs (ovaries, uterus, and vagina). This screening involves a pelvic examination, including checking for microscopic changes to the surface of your cervix (Pap test). You may be encouraged to have this screening done every 3 years, beginning at age 21.  For women ages 30-65, health care providers may recommend pelvic exams and Pap testing every 3 years, or they may recommend the Pap and pelvic exam, combined with testing for human papilloma virus (HPV), every 5 years. Some types of HPV increase your risk of cervical cancer. Testing for HPV may also be done on women of any age with unclear Pap test results.  Other health care providers may not recommend any screening for nonpregnant women who are considered low risk for pelvic cancer and who do not have symptoms. Ask your health care provider if a screening pelvic exam is right for you.  If you have had past treatment for cervical cancer or a condition that could lead to cancer, you need Pap tests and screening for cancer for at least 20 years after your treatment. If Pap tests have been discontinued, your risk factors (such as having a new sexual partner) need to be reassessed to determine if screening should resume. Some women have medical problems that increase the chance of getting cervical cancer. In these cases, your health care provider may recommend more frequent screening and Pap tests.  Colorectal Cancer  This type of cancer can be detected and often prevented.  Routine  colorectal cancer screening usually begins at 63 years of age and continues through 63 years of age.  Your health care provider may recommend screening at an earlier age if you have risk factors for colon cancer.  Your health care provider may also recommend using home test kits to check for hidden blood in the stool.  A small camera at the end of a tube can be used to examine your colon directly (sigmoidoscopy or colonoscopy). This is done to check for the earliest forms of colorectal cancer.  Routine screening usually begins at age 50.  Direct examination of the colon should be repeated every 5-10 years through 63 years of age. However, you may need to be screened more often if early forms of precancerous polyps or small growths are found.  Skin Cancer  Check your skin from head to toe regularly.  Tell your health care provider about any new moles or changes in moles, especially if there is a change in a mole's shape or color.  Also tell your health care provider if   you have a mole that is larger than the size of a pencil eraser.  Always use sunscreen. Apply sunscreen liberally and repeatedly throughout the day.  Protect yourself by wearing long sleeves, pants, a wide-brimmed hat, and sunglasses whenever you are outside.  Heart disease, diabetes, and high blood pressure  High blood pressure causes heart disease and increases the risk of stroke. High blood pressure is more likely to develop in: ? People who have blood pressure in the high end of the normal range (130-139/85-89 mm Hg). ? People who are overweight or obese. ? People who are African American.  If you are 42-30 years of age, have your blood pressure checked every 3-5 years. If you are 77 years of age or older, have your blood pressure checked every year. You should have your blood pressure measured twice-once when you are at a hospital or clinic, and once when you are not at a hospital or clinic. Record the average of the  two measurements. To check your blood pressure when you are not at a hospital or clinic, you can use: ? An automated blood pressure machine at a pharmacy. ? A home blood pressure monitor.  If you are between 96 years and 41 years old, ask your health care provider if you should take aspirin to prevent strokes.  Have regular diabetes screenings. This involves taking a blood sample to check your fasting blood sugar level. ? If you are at a normal weight and have a low risk for diabetes, have this test once every three years after 63 years of age. ? If you are overweight and have a high risk for diabetes, consider being tested at a younger age or more often. Preventing infection Hepatitis B  If you have a higher risk for hepatitis B, you should be screened for this virus. You are considered at high risk for hepatitis B if: ? You were born in a country where hepatitis B is common. Ask your health care provider which countries are considered high risk. ? Your parents were born in a high-risk country, and you have not been immunized against hepatitis B (hepatitis B vaccine). ? You have HIV or AIDS. ? You use needles to inject street drugs. ? You live with someone who has hepatitis B. ? You have had sex with someone who has hepatitis B. ? You get hemodialysis treatment. ? You take certain medicines for conditions, including cancer, organ transplantation, and autoimmune conditions.  Hepatitis C  Blood testing is recommended for: ? Everyone born from [birth year] through 1967. ? Anyone with known risk factors for hepatitis C.  Sexually transmitted infections (STIs)  You should be screened for sexually transmitted infections (STIs) including gonorrhea and chlamydia if: ? You are sexually active and are younger than 63 years of age. ? You are older than 63 years of age and your health care provider tells you that you are at risk for this type of infection. ? Your sexual activity has changed since you  were last screened and you are at an increased risk for chlamydia or gonorrhea. Ask your health care provider if you are at risk.  If you do not have HIV, but are at risk, it may be recommended that you take a prescription medicine daily to prevent HIV infection. This is called pre-exposure prophylaxis (PrEP). You are considered at risk if: ? You are sexually active and do not regularly use condoms or know the HIV status of your partner(s). ? You take drugs by  injection. ? You are sexually active with a partner who has HIV.  Talk with your health care provider about whether you are at high risk of being infected with HIV. If you choose to begin PrEP, you should first be tested for HIV. You should then be tested every 3 months for as long as you are taking PrEP. Pregnancy  If you are premenopausal and you may become pregnant, ask your health care provider about preconception counseling.  If you may become pregnant, take 400 to 800 micrograms (mcg) of folic acid every day.  If you want to prevent pregnancy, talk to your health care provider about birth control (contraception). Osteoporosis and menopause  Osteoporosis is a disease in which the bones lose minerals and strength with aging. This can result in serious bone fractures. Your risk for osteoporosis can be identified using a bone density scan.  If you are 52 years of age or older, or if you are at risk for osteoporosis and fractures, ask your health care provider if you should be screened.  Ask your health care provider whether you should take a calcium or vitamin D supplement to lower your risk for osteoporosis.  Menopause may have certain physical symptoms and risks.  Hormone replacement therapy may reduce some of these symptoms and risks. Talk to your health care provider about whether hormone replacement therapy is right for you. Follow these instructions at home:  Schedule regular health, dental, and eye exams.  Stay current  with your immunizations.  Do not use any tobacco products including cigarettes, chewing tobacco, or electronic cigarettes.  If you are pregnant, do not drink alcohol.  If you are breastfeeding, limit how much and how often you drink alcohol.  Limit alcohol intake to no more than 1 drink per day for nonpregnant women. One drink equals 12 ounces of beer, 5 ounces of wine, or 1 ounces of hard liquor.  Do not use street drugs.  Do not share needles.  Ask your health care provider for help if you need support or information about quitting drugs.  Tell your health care provider if you often feel depressed.  Tell your health care provider if you have ever been abused or do not feel safe at home. This information is not intended to replace advice given to you by your health care provider. Make sure you discuss any questions you have with your health care provider. Document Released: 04/26/2011 Document Revised: 03/18/2016 Document Reviewed: 07/15/2015 Elsevier Interactive Patient Education  [employer].

## 2017-09-20 NOTE — Assessment & Plan Note (Addendum)
Clinically euthyroid Check tsh  Titrate med dose if needed  

## 2017-09-20 NOTE — Progress Notes (Signed)
Subjective:    Patient ID: Brittany [name], female    DOB: [birth year], 61 y.o.   MRN: [id number]  HPI She is here for a physical exam.   Left breast cancer: she was diagnosed in 1996.  It was lobular carcinoma.  she is due for her mammogram in January 2019.    Occasionally she gets a stuffy ear or water in her ear.  It is her right ear.  She sees ENT in January.  She will be flying in December and usually does not have issues with flying.  She had discomfort yesterday(anterior-superior to ear) and took sudafed and advil - it has resolved.  Otherwise she feels well and has no concerns.  Medications and allergies reviewed with patient and updated if appropriate.  Patient Active Problem List   Diagnosis Date Noted  . Raynaud phenomenon 09/21/2016  . 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 Left rotator cuff tear 10/07/2015  . Hypothyroid 09/28/2015  . Leukopenia 12/08/2012  . Metatarsalgia of both feet 09/21/2011  . CERVICALGIA 05/04/2010  . ANEMIA-NOS 03/17/2009  . ASYMPTOMATIC VARICOSE VEINS 03/17/2009  . OSTEOARTHRITIS 03/17/2009  . SCOLIOSIS, MILD 03/17/2009  . BREAST CANCER, HX OF 03/17/2009    Current Outpatient Medications on File Prior to Visit  Medication Sig Dispense Refill  . Calcium Citrate 250 MG TABS Take 4 tablets (1,000 mg total) by mouth daily.  0  . Cholecalciferol (VITAMIN D3) 1000 UNITS CAPS Take by mouth daily.      . Diclofenac Sodium (PENNSAID) 2 % SOLN Apply topically two times a day as needed 112 g 0  . levothyroxine (SYNTHROID, LEVOTHROID) 50 MCG tablet take 1 tablet by mouth once daily 90 tablet 1  . Multiple Vitamin (MULTIVITAMIN) tablet Take 1 tablet by mouth daily.      . Omega-3 Fatty Acids (FISH OIL) 1000 MG CPDR Take by mouth daily.       No current facility-administered medications on file prior to visit.     Past Medical History:  Diagnosis Date  . ANEMIA-NOS   . Asymptomatic varicose veins   . BREAST CANCER, HX  OF 01/1995 dx   s/p Lumpectomy, XRT, chemo and 46yr armidex  . Gestational diabetes mellitus in childbirth, diet controlled 1985  . Hypothyroid 09/28/2015   Dx 09/2015  . Leukopenia    mild since chemo  . OSTEOARTHRITIS   . SCOLIOSIS, MILD     Past Surgical History:  Procedure Laterality Date  . BREAST BIOPSY    . left lumpectomy  1996  . TONSILLECTOMY    . TOTAL HIP ARTHROPLASTY     2002 & 2003-Dr. vail @ [place] History   Socioeconomic History  . Marital status: Married    Spouse name: None  . Number of children: None  . Years of education: None  . Highest education level: None  Social Needs  . Financial resource strain: None  . Food insecurity - worry: None  . Food insecurity - inability: None  . Transportation needs - medical: None  . Transportation needs - non-medical: None  Occupational History  . None  Tobacco Use  . Smoking status: Former Smoker    Last attempt to quit: 06/25/1977    Years since quitting: 40.2  . Smokeless tobacco: Never Used  Substance and Sexual Activity  . Alcohol use: Yes    Alcohol/week: 1.8 - 2.4 oz    Types: 3 - 4 Glasses of wine per  week  . Drug use: No  . Sexual activity: None  Other Topics Concern  . None  Social History Narrative   Married-lives with spouse & son. Art gallery manager, enjoys golf, sports, bridge, swim, and walking dog    Family History  Problem Relation Age of Onset  . Heart disease Father 68       AMI  . Hyperlipidemia Father   . Valvular heart disease Father 65  . Parkinson's disease Father 27  . Arthritis Mother   . Breast cancer Mother 41  . Hyperlipidemia Mother   . Osteoporosis Mother   . Pancreatic cancer Maternal Grandmother 73       presumed dx  . Diabetes Paternal Grandfather        type 2    Review of Systems  Constitutional: Negative for chills, fatigue and fever.  Eyes: Negative for visual disturbance.  Respiratory: Negative for cough, shortness of breath and wheezing.     Cardiovascular: Negative for chest pain, palpitations and leg swelling.  Gastrointestinal: Negative for abdominal pain, blood in stool, constipation, diarrhea and nausea.  Genitourinary: Negative for dysuria and hematuria.  Musculoskeletal: Positive for arthralgias (occ hand, wrist pain, shoulder).  Skin: Negative for color change and rash.  Neurological: Negative for dizziness, light-headedness and headaches.  Psychiatric/Behavioral: Negative for dysphoric mood. The patient is not nervous/anxious.        Objective:   Vitals:   09/21/17 0838  BP: 116/82  Pulse: 63  Resp: 16  Temp: 98 F (36.7 C)  SpO2: 97%   Filed Weights   09/21/17 0838  Weight: 148 lb (67.1 kg)   Body mass index is 22.5 kg/m.  Wt Readings from Last 3 Encounters:  09/21/17 148 lb (67.1 kg)  09/21/16 149 lb (67.6 kg)  09/13/16 151 lb (68.5 kg)     Physical Exam Constitutional: She appears well-developed and well-nourished. No distress.  HENT:  Head: Normocephalic and atraumatic.  Right Ear: External ear normal. Normal ear canal and TM Left Ear: External ear normal.  Normal ear canal and TM Mouth/Throat: Oropharynx is clear and moist.  Eyes: Conjunctivae and EOM are normal.  Neck: Neck supple. No tracheal deviation present. No thyromegaly present.  No carotid bruit  Cardiovascular: Normal rate, regular rhythm and normal heart sounds.   No murmur heard.  No edema. Pulmonary/Chest: Effort normal and breath sounds normal. No respiratory distress. She has no wheezes. She has no rales.  Breast: deferred to Gyn Abdominal: Soft. She exhibits no distension. There is no tenderness.  Lymphadenopathy: She has no cervical adenopathy.  Skin: Skin is warm and dry. She is not diaphoretic.  Psychiatric: She has a normal mood and affect. Her behavior is normal.        Assessment & Plan:   Physical exam: Screening blood work  ordered Immunizations   Discussed shingrix, others up to date Colonoscopy   Up to  date  Mammogram   Up to date  Gyn   Up to date  Dexa   Up to date  Eye exams  Up to date  EKG   Last done 2013 Exercise  regular Weight  Normal BMI Skin  No concerns, sees derm annually - has precancerous lesions Substance abuse   None    none  See Problem List for Assessment and Plan of chronic medical problems.

## 2017-09-21 ENCOUNTER — Ambulatory Visit (INDEPENDENT_AMBULATORY_CARE_PROVIDER_SITE_OTHER): Payer: BLUE CROSS/BLUE SHIELD | Admitting: Internal Medicine

## 2017-09-21 ENCOUNTER — Encounter: Payer: Self-pay | Admitting: Internal Medicine

## 2017-09-21 ENCOUNTER — Other Ambulatory Visit (INDEPENDENT_AMBULATORY_CARE_PROVIDER_SITE_OTHER): Payer: BLUE CROSS/BLUE SHIELD

## 2017-09-21 VITALS — BP 116/82 | HR 63 | Temp 98.0°F | Resp 16 | Ht 68.0 in | Wt 148.0 lb

## 2017-09-21 DIAGNOSIS — Z8632 Personal history of gestational diabetes: Secondary | ICD-10-CM

## 2017-09-21 DIAGNOSIS — Z853 Personal history of malignant neoplasm of breast: Secondary | ICD-10-CM

## 2017-09-21 DIAGNOSIS — Z0000 Encounter for general adult medical examination without abnormal findings: Secondary | ICD-10-CM

## 2017-09-21 DIAGNOSIS — E039 Hypothyroidism, unspecified: Secondary | ICD-10-CM | POA: Diagnosis not present

## 2017-09-21 LAB — CBC WITH DIFFERENTIAL/PLATELET
Basophils Absolute: 0 10*3/uL (ref 0.0–0.1)
Basophils Relative: 0.8 % (ref 0.0–3.0)
Eosinophils Absolute: 0.1 10*3/uL (ref 0.0–0.7)
Eosinophils Relative: 4.4 % (ref 0.0–5.0)
HCT: 39 % (ref 36.0–46.0)
Hemoglobin: 13.2 g/dL (ref 12.0–15.0)
Lymphocytes Relative: 32.3 % (ref 12.0–46.0)
Lymphs Abs: 1 10*3/uL (ref 0.7–4.0)
MCHC: 33.8 g/dL (ref 30.0–36.0)
MCV: 96.4 fl (ref 78.0–100.0)
Monocytes Absolute: 0.3 10*3/uL (ref 0.1–1.0)
Monocytes Relative: 8.5 % (ref 3.0–12.0)
Neutro Abs: 1.7 10*3/uL (ref 1.4–7.7)
Neutrophils Relative %: 54 % (ref 43.0–77.0)
Platelets: 214 10*3/uL (ref 150.0–400.0)
RBC: 4.04 Mil/uL (ref 3.87–5.11)
RDW: 12.4 % (ref 11.5–15.5)
WBC: 3.2 10*3/uL — ABNORMAL LOW (ref 4.0–10.5)

## 2017-09-21 LAB — LIPID PANEL
Cholesterol: 168 mg/dL (ref 0–200)
HDL: 78.5 mg/dL (ref >39.00)
LDL Cholesterol: 79 mg/dL (ref 0–99)
NonHDL: 89.62
Total CHOL/HDL Ratio: 2
Triglycerides: 52 mg/dL (ref 0.0–149.0)
VLDL: 10.4 mg/dL (ref 0.0–40.0)

## 2017-09-21 LAB — COMPREHENSIVE METABOLIC PANEL
ALT: 16 U/L (ref 0–35)
AST: 17 U/L (ref 0–37)
Albumin: 4.1 g/dL (ref 3.5–5.2)
Alkaline Phosphatase: 54 U/L (ref 39–117)
BUN: 11 mg/dL (ref 6–23)
CO2: 29 mEq/L (ref 19–32)
Calcium: 9.5 mg/dL (ref 8.4–10.5)
Chloride: 100 mEq/L (ref 96–112)
Creatinine, Ser: 0.86 mg/dL (ref 0.40–1.20)
GFR: 70.73 mL/min (ref >60.00)
Glucose, Bld: 96 mg/dL (ref 70–99)
Potassium: 4.3 mEq/L (ref 3.5–5.1)
Sodium: 135 mEq/L (ref 135–145)
Total Bilirubin: 0.6 mg/dL (ref 0.2–1.2)
Total Protein: 7 g/dL (ref 6.0–8.3)

## 2017-09-21 LAB — HEMOGLOBIN A1C: Hgb A1c MFr Bld: 5.5 % (ref 4.6–6.5)

## 2017-09-21 LAB — TSH: TSH: 3.9 u[IU]/mL (ref 0.35–4.50)

## 2017-09-21 MED ORDER — LEVOTHYROXINE SODIUM 50 MCG PO TABS: 50.0000 ug | ORAL_TABLET | Freq: Every day | ORAL | 3 refills | Status: DC

## 2017-09-21 NOTE — Assessment & Plan Note (Signed)
Given the type of cancer that she has had in the past, lobular carcinoma, which has increased risk of recurrence and there is difficulty seeing it may on the mammogram  -- we will pursue an MRI with her next mammogram. The believe the MRI to be medically necessary in addition to her screening mammogram

## 2017-09-21 NOTE — Assessment & Plan Note (Signed)
We will check A1c ?

## 2017-09-22 ENCOUNTER — Encounter: Payer: Self-pay | Admitting: Internal Medicine

## 2017-10-24 ENCOUNTER — Encounter: Payer: Self-pay | Admitting: Internal Medicine

## 2017-10-24 ENCOUNTER — Other Ambulatory Visit: Payer: Self-pay | Admitting: Internal Medicine

## 2017-10-24 DIAGNOSIS — Z853 Personal history of malignant neoplasm of breast: Secondary | ICD-10-CM

## 2017-10-24 DIAGNOSIS — Z1231 Encounter for screening mammogram for malignant neoplasm of breast: Secondary | ICD-10-CM

## 2017-11-01 ENCOUNTER — Other Ambulatory Visit: Payer: Self-pay | Admitting: Internal Medicine

## 2017-11-01 DIAGNOSIS — Z853 Personal history of malignant neoplasm of breast: Secondary | ICD-10-CM

## 2017-11-16 ENCOUNTER — Ambulatory Visit
Admission: RE | Admit: 2017-11-16 | Discharge: 2017-11-16 | Disposition: A | Discharge status: Home / Self Care | Payer: BLUE CROSS/BLUE SHIELD | Source: Ambulatory Visit | Attending: Internal Medicine | Admitting: Internal Medicine

## 2017-11-16 DIAGNOSIS — Z1231 Encounter for screening mammogram for malignant neoplasm of breast: Secondary | ICD-10-CM | POA: Diagnosis not present

## 2017-11-16 HISTORY — DX: Malignant neoplasm of unspecified site of unspecified female breast: C50.919

## 2017-11-16 HISTORY — DX: Personal history of irradiation: Z92.3

## 2017-11-16 HISTORY — DX: Personal history of antineoplastic chemotherapy: Z92.21

## 2017-11-21 DIAGNOSIS — H6123 Impacted cerumen, bilateral: Secondary | ICD-10-CM | POA: Diagnosis not present

## 2017-11-23 ENCOUNTER — Ambulatory Visit
Admission: RE | Admit: 2017-11-23 | Discharge: 2017-11-23 | Disposition: A | Discharge status: Home / Self Care | Payer: BLUE CROSS/BLUE SHIELD | Source: Ambulatory Visit | Attending: Internal Medicine | Admitting: Internal Medicine

## 2017-11-23 ENCOUNTER — Encounter: Payer: Self-pay | Admitting: Internal Medicine

## 2017-11-23 DIAGNOSIS — Z853 Personal history of malignant neoplasm of breast: Secondary | ICD-10-CM

## 2017-11-23 MED ORDER — GADOBENATE DIMEGLUMINE 529 MG/ML IV SOLN
14.0000 mL | Freq: Once | INTRAVENOUS | Status: AC | PRN
  Administered 2017-11-23: 14 mL via INTRAVENOUS

## 2017-11-25 MED ORDER — ZOSTER VAC RECOMB ADJUVANTED 50 MCG/0.5ML IM SUSR: 0.5000 mL | Freq: Once | INTRAMUSCULAR | 1 refills | Status: AC

## 2017-12-29 MED FILL — SHINGRIX 50 MCG SUS: 50 | 1 days supply | Qty: 1 | Fill #0

## 2018-02-28 MED FILL — SHINGRIX 50 MCG SUS: 50 | 1 days supply | Qty: 1 | Fill #1

## 2018-05-29 DIAGNOSIS — H25813 Combined forms of age-related cataract, bilateral: Secondary | ICD-10-CM | POA: Diagnosis not present

## 2018-05-29 DIAGNOSIS — H40033 Anatomical narrow angle, bilateral: Secondary | ICD-10-CM | POA: Diagnosis not present

## 2018-05-29 DIAGNOSIS — H10413 Chronic giant papillary conjunctivitis, bilateral: Secondary | ICD-10-CM | POA: Diagnosis not present

## 2018-05-30 ENCOUNTER — Ambulatory Visit: Payer: BLUE CROSS/BLUE SHIELD | Admitting: Sports Medicine

## 2018-05-30 ENCOUNTER — Ambulatory Visit: Payer: Self-pay

## 2018-05-30 ENCOUNTER — Encounter: Payer: Self-pay | Admitting: Sports Medicine

## 2018-05-30 ENCOUNTER — Ambulatory Visit
Admission: RE | Admit: 2018-05-30 | Discharge: 2018-05-30 | Disposition: A | Discharge status: Home / Self Care | Payer: BLUE CROSS/BLUE SHIELD | Source: Ambulatory Visit | Attending: Sports Medicine | Admitting: Sports Medicine

## 2018-05-30 VITALS — BP 108/64 | Wt 146.6 lb

## 2018-05-30 DIAGNOSIS — M25512 Pain in left shoulder: Secondary | ICD-10-CM

## 2018-05-30 DIAGNOSIS — M19012 Primary osteoarthritis, left shoulder: Secondary | ICD-10-CM | POA: Diagnosis not present

## 2018-05-30 DIAGNOSIS — M25312 Other instability, left shoulder: Secondary | ICD-10-CM | POA: Diagnosis not present

## 2018-05-30 NOTE — Progress Notes (Signed)
HPI  CC: Shoulder pain  Brittany [name] is a 62 year old female with history of hypothyroidism who presents today for left shoulder pain.  She states the has been present for around 1 to 2 years.  At that time she saw Dr. Gardenia Phlegm, who diagnosed with a partial tear of her supraspinatus.(2016)  She underwent CSI, physical therapy and did Voltaren cream.  She states these measures helped the pain somewhat.  She is able to get back to her active lifestyle.  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  She states she noticed the pain most when she is doing the breaststroke during swimming and when she is doing a ball toss during tennis.  She states she is unable to do either of these things this time due to being limited by pain.  She states the pain is on the anterior portion of her left shoulder.  States she has pain with any overhead activity of the shoulder.  She states it does bother her when she sleeps and rolls on that side.  She also tried KT tape during physical therapy with some benefit.  She denies any numbness and tingling in the arm.  She denies any weakness in the arm.  Is not recall any trauma to the area.  Past Injuries: None to the shoulder Past Surgeries: Bilateral hip replacements Smoking: Former smoker, quit 1978. Family Hx: Here with other family members with joint laxity.  ROS: Per HPI; in addition no fever, no rash, no additional weakness, no additional numbness, no additional paresthesias, and no additional falls/injury.   All past history, medications, allergies reviewed by myself at today's visit.  Objective: BP 108/64 (BP Location: Right Arm, Patient Position: Sitting, Cuff Size: Normal)   Wt 146 lb 9.6 oz (66.5 kg)   BMI 22.29 kg/m  Gen: Right-Hand Dominant. NAD, well groomed, a/o x3, normal affect.  CV: Well-perfused. Warm.  Resp: Non-labored.  Neuro: Sensation intact throughout. No gross coordination deficits.  Gait: Nonpathologic posture,  unremarkable stride without signs of limp or balance issues.  Shoulder exam.  No swelling, erythema, rash noted.  There is noticeable atrophy of the left infraspinatus.  Positive sulcus sign bilaterally.  Tenderness to palpation over the left AC joint.  Full active and passive range of motion.  No pain with resisted range of motion.  Crepitus noted with external rotation.  5 out of 5 strength throughout testing.  Positive Hawkins test Neer's test, positive, positive apprehension test.  Negative empty can, negative speeds test, negative negative crossover test.  ULTRASOUND: Shoulder, left Diagnostic complete ultrasound imaging obtained of patient's left shoulder.  - No obvious evidence of bony deformity or osteophyte development appreciated.  - Long head of the biceps tendon: No evidence of tendon thickening, calcification, subluxation, or tearing in short or long axis views.  Noticeable edema around biceps tendon in the short axis view.  Some loose bodies in the long axis view, around where the labrum should be. - Subscapularis tendon: complete visualization across the width of the insertion point yielded no evidence of tendon thickening, calcification, or tears in the long axis view.  - Supraspinatus tendon: complete visualization across the width of the insertion point yielded evidence of tendon thickening calcification in the long axis view. No evidence of bursal inflammation appreciated.  There is a bone spur noted along the distal edge of the supraspinatus. - Infraspinatus and teres minor tendons: visualization across the width of the insertion points yielded no evidence of tendon  thickening, calcification, or tears in the long axis view.  -loose Bodies noted around the posterior glenohumeral joint space, with tears appreciated in the labrum in the short axis view. [facility] Joint: No evidence of joint separation, collapse, or osteophyte development appreciated. No effusion present.  IMPRESSION:  findings consistent with labrum loose bodies,  calcific supraspinatus tendinopathy that has improved since evaluation of 2016  Ultrasound and interpretation by Lewanda Rife, MD and Wolfgang Phoenix. Buford Gayler, MD .   Assessment and Plan:  1. Multidirectional shoulder instability 2. Labrum loose bodies, seen on [place] today. 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  This instability is likely what is causing the issues with her labrum and with her rotator cuff.  At this time we recommended isometric shoulder stabilization exercises.  These were demonstrated in clinic care today.  We will do an x-ray of her left shoulder for evaluation of the bony process to ensure there is no bony involvement considering the laxity of her shoulder over this amount of time.  She can continue with Voltaren cream at this time of the affected shoulder.  We have advised her to avoid any actions which would take her shoulder past neutral in line with her body.  She should avoid the breaststroke at this time.  Pending results of x-ray, we will have further work-up at that time.  She can follow-up in this clinic in 1 to 2 months, or sooner if needed.  Lewanda Rife, MD [facility] Fellow 05/30/2018 1:49 PM  I observed and examined the patient with the [facility] Fellow and agree with assessment and plan.  Note reviewed and modified by me. Stefanie Libel, MD

## 2018-05-30 NOTE — Progress Notes (Signed)
d 

## 2018-05-31 ENCOUNTER — Encounter: Payer: Self-pay | Admitting: Sports Medicine

## 2018-05-31 ENCOUNTER — Other Ambulatory Visit: Payer: Self-pay | Admitting: Family Medicine

## 2018-06-01 ENCOUNTER — Other Ambulatory Visit: Payer: Self-pay

## 2018-06-01 MED ORDER — DICLOFENAC SODIUM 1 % TD GEL: 2.0000 g | Freq: Three times a day (TID) | TRANSDERMAL | 1 refills | Status: DC | PRN

## 2018-06-14 DIAGNOSIS — Z803 Family history of malignant neoplasm of breast: Secondary | ICD-10-CM | POA: Diagnosis not present

## 2018-06-14 DIAGNOSIS — Z01419 Encounter for gynecological examination (general) (routine) without abnormal findings: Secondary | ICD-10-CM | POA: Diagnosis not present

## 2018-06-14 DIAGNOSIS — Z6822 Body mass index (BMI) 22.0-22.9, adult: Secondary | ICD-10-CM | POA: Diagnosis not present

## 2018-06-27 DIAGNOSIS — H40033 Anatomical narrow angle, bilateral: Secondary | ICD-10-CM | POA: Diagnosis not present

## 2018-06-27 DIAGNOSIS — H2513 Age-related nuclear cataract, bilateral: Secondary | ICD-10-CM | POA: Diagnosis not present

## 2018-07-03 ENCOUNTER — Ambulatory Visit (INDEPENDENT_AMBULATORY_CARE_PROVIDER_SITE_OTHER): Payer: BLUE CROSS/BLUE SHIELD

## 2018-07-03 DIAGNOSIS — Z23 Encounter for immunization: Secondary | ICD-10-CM | POA: Diagnosis not present

## 2018-07-03 NOTE — Progress Notes (Signed)
Flu Injection given.   Anetta Olvera J Mahesh Sizemore, MD  

## 2018-07-10 DIAGNOSIS — M25512 Pain in left shoulder: Secondary | ICD-10-CM | POA: Diagnosis not present

## 2018-07-10 DIAGNOSIS — M25612 Stiffness of left shoulder, not elsewhere classified: Secondary | ICD-10-CM | POA: Diagnosis not present

## 2018-08-07 DIAGNOSIS — M25512 Pain in left shoulder: Secondary | ICD-10-CM | POA: Diagnosis not present

## 2018-08-07 DIAGNOSIS — M25612 Stiffness of left shoulder, not elsewhere classified: Secondary | ICD-10-CM | POA: Diagnosis not present

## 2018-09-04 DIAGNOSIS — M25512 Pain in left shoulder: Secondary | ICD-10-CM | POA: Diagnosis not present

## 2018-09-04 DIAGNOSIS — M25612 Stiffness of left shoulder, not elsewhere classified: Secondary | ICD-10-CM | POA: Diagnosis not present

## 2018-09-04 DIAGNOSIS — H6123 Impacted cerumen, bilateral: Secondary | ICD-10-CM | POA: Diagnosis not present

## 2018-09-11 DIAGNOSIS — R39198 Other difficulties with micturition: Secondary | ICD-10-CM | POA: Diagnosis not present

## 2018-09-18 DIAGNOSIS — M25512 Pain in left shoulder: Secondary | ICD-10-CM | POA: Diagnosis not present

## 2018-09-18 DIAGNOSIS — H40032 Anatomical narrow angle, left eye: Secondary | ICD-10-CM | POA: Diagnosis not present

## 2018-09-18 DIAGNOSIS — M25612 Stiffness of left shoulder, not elsewhere classified: Secondary | ICD-10-CM | POA: Diagnosis not present

## 2018-09-24 NOTE — Patient Instructions (Addendum)
Tests ordered today. Your results will be released to MyChart (or called to you) after review, usually within 72hours after test completion. If any changes need to be made, you will be notified at that same time.  All other Health Maintenance issues reviewed.   All recommended immunizations and age-appropriate screenings are up-to-date or discussed.  No immunizations administered today.   Medications reviewed and updated.  Changes include :   none   Please followup in one year    Health Maintenance, Female Adopting a healthy lifestyle and getting preventive care can go a long way to promote health and wellness. Talk with your health care provider about what schedule of regular examinations is right for you. This is a good chance for you to check in with your provider about disease prevention and staying healthy. In between checkups, there are plenty of things you can do on your own. Experts have done a lot of research about which lifestyle changes and preventive measures are most likely to keep you healthy. Ask your health care provider for more information. Weight and diet Eat a healthy diet  Be sure to include plenty of vegetables, fruits, low-fat dairy products, and lean protein.  Do not eat a lot of foods high in solid fats, added sugars, or salt.  Get regular exercise. This is one of the most important things you can do for your health. ? Most adults should exercise for at least 150 minutes each week. The exercise should increase your heart rate and make you sweat (moderate-intensity exercise). ? Most adults should also do strengthening exercises at least twice a week. This is in addition to the moderate-intensity exercise.  Maintain a healthy weight  Body mass index (BMI) is a measurement that can be used to identify possible weight problems. It estimates body fat based on height and weight. Your health care provider can help determine your BMI and help you achieve or maintain a  healthy weight.  For females 20 years of age and older: ? A BMI below 18.5 is considered underweight. ? A BMI of 18.5 to 24.9 is normal. ? A BMI of 25 to 29.9 is considered overweight. ? A BMI of 30 and above is considered obese.  Watch levels of cholesterol and blood lipids  You should start having your blood tested for lipids and cholesterol at 64 years of age, then have this test every 5 years.  You may need to have your cholesterol levels checked more often if: ? Your lipid or cholesterol levels are high. ? You are older than 64 years of age. ? You are at high risk for heart disease.  Cancer screening Lung Cancer  Lung cancer screening is recommended for adults 55-80 years old who are at high risk for lung cancer because of a history of smoking.  A yearly low-dose CT scan of the lungs is recommended for people who: ? Currently smoke. ? Have quit within the past 15 years. ? Have at least a 30-pack-year history of smoking. A pack year is smoking an average of one pack of cigarettes a day for 1 year.  Yearly screening should continue until it has been 15 years since you quit.  Yearly screening should stop if you develop a health problem that would prevent you from having lung cancer treatment.  Breast Cancer  Practice breast self-awareness. This means understanding how your breasts normally appear and feel.  It also means doing regular breast self-exams. Let your health care provider know about any   any changes, no matter how small.  If you are in your 20s or 30s, you should have a clinical breast exam (CBE) by a health care provider every 1-3 years as part of a regular health exam.  If you are 58 or older, have a CBE every year. Also consider having a breast X-ray (mammogram) every year.  If you have a family history of breast cancer, talk to your health care provider about genetic screening.  If you are at high risk for breast cancer, talk to your health care provider about  having an MRI and a mammogram every year.  Breast cancer gene (BRCA) assessment is recommended for women who have family members with BRCA-related cancers. BRCA-related cancers include: ? Breast. ? Ovarian. ? Tubal. ? Peritoneal cancers.  Results of the assessment will determine the need for genetic counseling and BRCA1 and BRCA2 testing.  Cervical Cancer Your health care provider may recommend that you be screened regularly for cancer of the pelvic organs (ovaries, uterus, and vagina). This screening involves a pelvic examination, including checking for microscopic changes to the surface of your cervix (Pap test). You may be encouraged to have this screening done every 3 years, beginning at age 39.  For women ages 13-65, health care providers may recommend pelvic exams and Pap testing every 3 years, or they may recommend the Pap and pelvic exam, combined with testing for human papilloma virus (HPV), every 5 years. Some types of HPV increase your risk of cervical cancer. Testing for HPV may also be done on women of any age with unclear Pap test results.  Other health care providers may not recommend any screening for nonpregnant women who are considered low risk for pelvic cancer and who do not have symptoms. Ask your health care provider if a screening pelvic exam is right for you.  If you have had past treatment for cervical cancer or a condition that could lead to cancer, you need Pap tests and screening for cancer for at least 20 years after your treatment. If Pap tests have been discontinued, your risk factors (such as having a new sexual partner) need to be reassessed to determine if screening should resume. Some women have medical problems that increase the chance of getting cervical cancer. In these cases, your health care provider may recommend more frequent screening and Pap tests.  Colorectal Cancer  This type of cancer can be detected and often prevented.  Routine colorectal cancer  screening usually begins at 64 years of age and continues through 64 years of age.  Your health care provider may recommend screening at an earlier age if you have risk factors for colon cancer.  Your health care provider may also recommend using home test kits to check for hidden blood in the stool.  A small camera at the end of a tube can be used to examine your colon directly (sigmoidoscopy or colonoscopy). This is done to check for the earliest forms of colorectal cancer.  Routine screening usually begins at age 47.  Direct examination of the colon should be repeated every 5-10 years through 64 years of age. However, you may need to be screened more often if early forms of precancerous polyps or small growths are found.  Skin Cancer  Check your skin from head to toe regularly.  Tell your health care provider about any new moles or changes in moles, especially if there is a change in a mole's shape or color.  Also tell your health care provider  you have a mole that is larger than the size of a pencil eraser.  Always use sunscreen. Apply sunscreen liberally and repeatedly throughout the day.  Protect yourself by wearing long sleeves, pants, a wide-brimmed hat, and sunglasses whenever you are outside.  Heart disease, diabetes, and high blood pressure  High blood pressure causes heart disease and increases the risk of stroke. High blood pressure is more likely to develop in: ? People who have blood pressure in the high end of the normal range (130-139/85-89 mm Hg). ? People who are overweight or obese. ? People who are African American.  If you are 18-39 years of age, have your blood pressure checked every 3-5 years. If you are 40 years of age or older, have your blood pressure checked every year. You should have your blood pressure measured twice-once when you are at a hospital or clinic, and once when you are not at a hospital or clinic. Record the average of the two measurements.  To check your blood pressure when you are not at a hospital or clinic, you can use: ? An automated blood pressure machine at a pharmacy. ? A home blood pressure monitor.  If you are between 55 years and 79 years old, ask your health care provider if you should take aspirin to prevent strokes.  Have regular diabetes screenings. This involves taking a blood sample to check your fasting blood sugar level. ? If you are at a normal weight and have a low risk for diabetes, have this test once every three years after 64 years of age. ? If you are overweight and have a high risk for diabetes, consider being tested at a younger age or more often. Preventing infection Hepatitis B  If you have a higher risk for hepatitis B, you should be screened for this virus. You are considered at high risk for hepatitis B if: ? You were born in a country where hepatitis B is common. Ask your health care provider which countries are considered high risk. ? Your parents were born in a high-risk country, and you have not been immunized against hepatitis B (hepatitis B vaccine). ? You have HIV or AIDS. ? You use needles to inject street drugs. ? You live with someone who has hepatitis B. ? You have had sex with someone who has hepatitis B. ? You get hemodialysis treatment. ? You take certain medicines for conditions, including cancer, organ transplantation, and autoimmune conditions.  Hepatitis C  Blood testing is recommended for: ? Everyone born from 1945 through 1965. ? Anyone with known risk factors for hepatitis C.  Sexually transmitted infections (STIs)  You should be screened for sexually transmitted infections (STIs) including gonorrhea and chlamydia if: ? You are sexually active and are younger than 64 years of age. ? You are older than 64 years of age and your health care provider tells you that you are at risk for this type of infection. ? Your sexual activity has changed since you were last screened  and you are at an increased risk for chlamydia or gonorrhea. Ask your health care provider if you are at risk.  If you do not have HIV, but are at risk, it may be recommended that you take a prescription medicine daily to prevent HIV infection. This is called pre-exposure prophylaxis (PrEP). You are considered at risk if: ? You are sexually active and do not regularly use condoms or know the HIV status of your partner(s). ? You take drugs by   by injection. ? You are sexually active with a partner who has HIV.  Talk with your health care provider about whether you are at high risk of being infected with HIV. If you choose to begin PrEP, you should first be tested for HIV. You should then be tested every 3 months for as long as you are taking PrEP. Pregnancy  If you are premenopausal and you may become pregnant, ask your health care provider about preconception counseling.  If you may become pregnant, take 400 to 800 micrograms (mcg) of folic acid every day.  If you want to prevent pregnancy, talk to your health care provider about birth control (contraception). Osteoporosis and menopause  Osteoporosis is a disease in which the bones lose minerals and strength with aging. This can result in serious bone fractures. Your risk for osteoporosis can be identified using a bone density scan.  If you are 29 years of age or older, or if you are at risk for osteoporosis and fractures, ask your health care provider if you should be screened.  Ask your health care provider whether you should take a calcium or vitamin D supplement to lower your risk for osteoporosis.  Menopause may have certain physical symptoms and risks.  Hormone replacement therapy may reduce some of these symptoms and risks. Talk to your health care provider about whether hormone replacement therapy is right for you. Follow these instructions at home:  Schedule regular health, dental, and eye exams.  Stay current with your  immunizations.  Do not use any tobacco products including cigarettes, chewing tobacco, or electronic cigarettes.  If you are pregnant, do not drink alcohol.  If you are breastfeeding, limit how much and how often you drink alcohol.  Limit alcohol intake to no more than 1 drink per day for nonpregnant women. One drink equals 12 ounces of beer, 5 ounces of wine, or 1 ounces of hard liquor.  Do not use street drugs.  Do not share needles.  Ask your health care provider for help if you need support or information about quitting drugs.  Tell your health care provider if you often feel depressed.  Tell your health care provider if you have ever been abused or do not feel safe at home. This information is not intended to replace advice given to you by your health care provider. Make sure you discuss any questions you have with your health care provider. Document Released: 04/26/2011 Document Revised: 03/18/2016 Document Reviewed: 07/15/2015 Elsevier Interactive Patient Education  [employer].

## 2018-09-24 NOTE — Progress Notes (Signed)
Subjective:    Patient ID: Brittany [name], female    DOB: 05-28-1956, 62 y.o.   MRN: [id number]  HPI She is here for a physical exam.   She is having treatment for her left shoulder.  She had a rotator cuff tear a couple of years ago and has arthritis.  She is working on her range of motion.  She is doing PT.    She gets neck crepitus.  She has neck soreness.  She does neck exercises.    Medications and allergies reviewed with patient and updated if appropriate.  Patient Active Problem List   Diagnosis Date Noted  . History of gestational diabetes 09/21/2017  . Raynaud phenomenon 09/21/2016  . 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 Left rotator cuff tear 10/07/2015  . Hypothyroid 09/28/2015  . Leukopenia 12/08/2012  . Metatarsalgia of both feet 09/21/2011  . CERVICALGIA 05/04/2010  . ASYMPTOMATIC VARICOSE VEINS 03/17/2009  . OSTEOARTHRITIS 03/17/2009  . SCOLIOSIS, MILD 03/17/2009  . BREAST CANCER, HX OF 03/17/2009    Current Outpatient Medications on File Prior to Visit  Medication Sig Dispense Refill  . Calcium Citrate 250 MG TABS Take 4 tablets (1,000 mg total) by mouth daily.  0  . Cholecalciferol (VITAMIN D3) 1000 UNITS CAPS Take by mouth daily.      . Diclofenac Sodium (PENNSAID) 2 % SOLN Apply topically two times a day as needed 112 g 0  . diclofenac sodium (VOLTAREN) 1 % GEL Apply 2 g topically 3 (three) times daily as needed. 100 g 1  . levothyroxine (SYNTHROID, LEVOTHROID) 50 MCG tablet Take 1 tablet (50 mcg total) by mouth daily. 90 tablet 3  . Multiple Vitamin (MULTIVITAMIN) tablet Take 1 tablet by mouth daily.      . Omega-3 Fatty Acids (FISH OIL) 1000 MG CPDR Take by mouth daily.       No current facility-administered medications on file prior to visit.     Past Medical History:  Diagnosis Date  . ANEMIA-NOS   . Asymptomatic varicose veins   . Breast cancer ([place])   . BREAST CANCER, HX OF 01/1995 dx   s/p Lumpectomy, XRT, chemo  and 65yr armidex  . Gestational diabetes mellitus in childbirth, diet controlled 1985  . Hypothyroid 09/28/2015   Dx 09/2015  . Leukopenia    mild since chemo  . OSTEOARTHRITIS   . Personal history of chemotherapy   . Personal history of radiation therapy   . SCOLIOSIS, MILD     Past Surgical History:  Procedure Laterality Date  . BREAST BIOPSY    . BREAST LUMPECTOMY Left    1996  . left lumpectomy  1996  . TONSILLECTOMY    . TOTAL HIP ARTHROPLASTY     2002 & 2003-Dr. vail @ [facility] History   Socioeconomic History  . Marital status: Married    Spouse name: Not on file  . Number of children: Not on file  . Years of education: Not on file  . Highest education level: Not on file  Occupational History  . Not on file  Social Needs  . Financial resource strain: Not on file  . Food insecurity:    Worry: Not on file    Inability: Not on file  . Transportation needs:    Medical: Not on file    Non-medical: Not on file  Tobacco Use  . Smoking status: Former Smoker    Last attempt to quit:  06/25/1977    Years since quitting: 41.2  . Smokeless tobacco: Never Used  Substance and Sexual Activity  . Alcohol use: Yes    Alcohol/week: 3.0 - 4.0 standard drinks    Types: 3 - 4 Glasses of wine per week  . Drug use: No  . Sexual activity: Not on file  Lifestyle  . Physical activity:    Days per week: Not on file    Minutes per session: Not on file  . Stress: Not on file  Relationships  . Social connections:    Talks on phone: Not on file    Gets together: Not on file    Attends religious service: Not on file    Active member of club or organization: Not on file    Attends meetings of clubs or organizations: Not on file    Relationship status: Not on file  Other Topics Concern  . Not on file  Social History Narrative   Married-lives with spouse & son. Art gallery manager, enjoys golf, sports, bridge, swim, and walking dog    Family History  Problem Relation Age of  Onset  . Heart disease Father 48       AMI  . Hyperlipidemia Father   . Valvular heart disease Father 61  . Parkinson's disease Father 4  . Arthritis Mother   . Breast cancer Mother 52  . Hyperlipidemia Mother   . Osteoporosis Mother   . Pancreatic cancer Maternal Grandmother 73       presumed dx  . Diabetes Paternal Grandfather        type 2  . Breast cancer Maternal Aunt     Review of Systems  Constitutional: Negative for chills, fatigue and fever.  Eyes: Negative for visual disturbance.  Respiratory: Negative for cough, shortness of breath and wheezing.   Cardiovascular: Negative for chest pain, palpitations and leg swelling.  Gastrointestinal: Negative for abdominal pain, blood in stool, constipation, diarrhea and nausea.       No gerd  Genitourinary: Negative for dysuria and hematuria.  Musculoskeletal: Positive for arthralgias and neck pain.  Skin: Negative for color change and rash.  Neurological: Negative for dizziness, light-headedness and headaches.  Psychiatric/Behavioral: Negative for dysphoric mood and sleep disturbance. The patient is not nervous/anxious.        Objective:   Vitals:   09/25/18 1329  BP: (!) 146/82  Pulse: (!) 52  Resp: 16  Temp: 98.4 F (36.9 C)  SpO2: 99%   Filed Weights   09/25/18 1329  Weight: 146 lb (66.2 kg)   Body mass index is 22.2 kg/m.  BP Readings from Last 3 Encounters:  09/25/18 (!) 146/82  05/30/18 108/64  09/21/17 116/82    Wt Readings from Last 3 Encounters:  09/25/18 146 lb (66.2 kg)  05/30/18 146 lb 9.6 oz (66.5 kg)  09/21/17 148 lb (67.1 kg)     Physical Exam Constitutional: She appears well-developed and well-nourished. No distress.  HENT:  Head: Normocephalic and atraumatic.  Right Ear: External ear normal. Normal ear canal and TM Left Ear: External ear normal.  Normal ear canal and TM Mouth/Throat: Oropharynx is clear and moist.  Eyes: Conjunctivae and EOM are normal.  Neck: Neck supple. No  tracheal deviation present. No thyromegaly present.  No carotid bruit  Cardiovascular: Normal rate, regular rhythm and normal heart sounds.   No murmur heard.  No edema. Pulmonary/Chest: Effort normal and breath sounds normal. No respiratory distress. She has no wheezes. She has no rales.  Breast: deferred to Gyn Abdominal: Soft. She exhibits no distension. There is no tenderness.  Lymphadenopathy: She has no cervical adenopathy.  Skin: Skin is warm and dry. She is not diaphoretic.  Psychiatric: She has a normal mood and affect. Her behavior is normal.        Assessment & Plan:   Physical exam: Screening blood work  ordered Immunizations  Had shngrix, others up to date Colonoscopy    Up to date  Mammogram  Last 10/2017 - will have one done in Jan 2020, also gets bi -annual MRI given high risk - will due 10/2019 Gyn  Up to date  Dexa    Up to date  - due in Jan 2020 at [facility] imaging Eye exams   Up to date  Exercise  Exercising in pool Weight   Normal bmi Skin     No concerns Substance abuse   none  See Problem List for Assessment and Plan of chronic medical problems.

## 2018-09-25 ENCOUNTER — Ambulatory Visit (INDEPENDENT_AMBULATORY_CARE_PROVIDER_SITE_OTHER): Payer: BLUE CROSS/BLUE SHIELD | Admitting: Internal Medicine

## 2018-09-25 ENCOUNTER — Ambulatory Visit (INDEPENDENT_AMBULATORY_CARE_PROVIDER_SITE_OTHER)
Admission: RE | Admit: 2018-09-25 | Discharge: 2018-09-25 | Disposition: A | Discharge status: Home / Self Care | Payer: BLUE CROSS/BLUE SHIELD | Source: Ambulatory Visit | Attending: Internal Medicine | Admitting: Internal Medicine

## 2018-09-25 ENCOUNTER — Other Ambulatory Visit (INDEPENDENT_AMBULATORY_CARE_PROVIDER_SITE_OTHER): Payer: BLUE CROSS/BLUE SHIELD

## 2018-09-25 ENCOUNTER — Encounter: Payer: Self-pay | Admitting: Internal Medicine

## 2018-09-25 VITALS — BP 146/82 | HR 52 | Temp 98.4°F | Resp 16 | Ht 68.0 in | Wt 146.0 lb

## 2018-09-25 DIAGNOSIS — M542 Cervicalgia: Secondary | ICD-10-CM | POA: Diagnosis not present

## 2018-09-25 DIAGNOSIS — Z853 Personal history of malignant neoplasm of breast: Secondary | ICD-10-CM

## 2018-09-25 DIAGNOSIS — Z13820 Encounter for screening for osteoporosis: Secondary | ICD-10-CM | POA: Diagnosis not present

## 2018-09-25 DIAGNOSIS — Z0000 Encounter for general adult medical examination without abnormal findings: Secondary | ICD-10-CM

## 2018-09-25 DIAGNOSIS — H40039 Anatomical narrow angle, unspecified eye: Secondary | ICD-10-CM | POA: Insufficient documentation

## 2018-09-25 DIAGNOSIS — E039 Hypothyroidism, unspecified: Secondary | ICD-10-CM

## 2018-09-25 DIAGNOSIS — E2839 Other primary ovarian failure: Secondary | ICD-10-CM | POA: Diagnosis not present

## 2018-09-25 LAB — LIPID PANEL
Cholesterol: 185 mg/dL (ref 0–200)
HDL: 85.2 mg/dL (ref >39.00)
LDL Cholesterol: 88 mg/dL (ref 0–99)
NonHDL: 99.7
Total CHOL/HDL Ratio: 2
Triglycerides: 59 mg/dL (ref 0.0–149.0)
VLDL: 11.8 mg/dL (ref 0.0–40.0)

## 2018-09-25 LAB — CBC WITH DIFFERENTIAL/PLATELET
Basophils Absolute: 0 10*3/uL (ref 0.0–0.1)
Basophils Relative: 0.7 % (ref 0.0–3.0)
Eosinophils Absolute: 0.1 10*3/uL (ref 0.0–0.7)
Eosinophils Relative: 1.5 % (ref 0.0–5.0)
HCT: 39.3 % (ref 36.0–46.0)
Hemoglobin: 13.4 g/dL (ref 12.0–15.0)
Lymphocytes Relative: 41.7 % (ref 12.0–46.0)
Lymphs Abs: 2.1 10*3/uL (ref 0.7–4.0)
MCHC: 34.1 g/dL (ref 30.0–36.0)
MCV: 94.3 fl (ref 78.0–100.0)
Monocytes Absolute: 0.4 10*3/uL (ref 0.1–1.0)
Monocytes Relative: 7.2 % (ref 3.0–12.0)
Neutro Abs: 2.4 10*3/uL (ref 1.4–7.7)
Neutrophils Relative %: 48.9 % (ref 43.0–77.0)
Platelets: 168 10*3/uL (ref 150.0–400.0)
RBC: 4.17 Mil/uL (ref 3.87–5.11)
RDW: 12.7 % (ref 11.5–15.5)
WBC: 5 10*3/uL (ref 4.0–10.5)

## 2018-09-25 LAB — COMPREHENSIVE METABOLIC PANEL
ALT: 25 U/L (ref 0–35)
AST: 31 U/L (ref 0–37)
Albumin: 4.4 g/dL (ref 3.5–5.2)
Alkaline Phosphatase: 60 U/L (ref 39–117)
BUN: 14 mg/dL (ref 6–23)
CO2: 29 mEq/L (ref 19–32)
Calcium: 10.1 mg/dL (ref 8.4–10.5)
Chloride: 98 mEq/L (ref 96–112)
Creatinine, Ser: 0.83 mg/dL (ref 0.40–1.20)
GFR: 73.46 mL/min (ref >60.00)
Glucose, Bld: 95 mg/dL (ref 70–99)
Potassium: 4.5 mEq/L (ref 3.5–5.1)
Sodium: 134 mEq/L — ABNORMAL LOW (ref 135–145)
Total Bilirubin: 0.4 mg/dL (ref 0.2–1.2)
Total Protein: 7.6 g/dL (ref 6.0–8.3)

## 2018-09-25 LAB — TSH: TSH: 2.69 u[IU]/mL (ref 0.35–4.50)

## 2018-09-25 NOTE — Assessment & Plan Note (Signed)
Chronic neck pain and crepitus Symptomatic treatment Xray today Can refer to ortho/sports if she wants

## 2018-09-25 NOTE — Assessment & Plan Note (Signed)
Clinically euthyroid Check tsh  Titrate med dose if needed  

## 2018-09-25 NOTE — Assessment & Plan Note (Signed)
mammo up to date MRI of breasts every other year - will do 2021

## 2018-09-26 ENCOUNTER — Encounter: Payer: Self-pay | Admitting: Internal Medicine

## 2018-09-29 DIAGNOSIS — D2262 Melanocytic nevi of left upper limb, including shoulder: Secondary | ICD-10-CM | POA: Diagnosis not present

## 2018-09-29 DIAGNOSIS — L821 Other seborrheic keratosis: Secondary | ICD-10-CM | POA: Diagnosis not present

## 2018-09-29 DIAGNOSIS — M25512 Pain in left shoulder: Secondary | ICD-10-CM | POA: Diagnosis not present

## 2018-09-29 DIAGNOSIS — M25612 Stiffness of left shoulder, not elsewhere classified: Secondary | ICD-10-CM | POA: Diagnosis not present

## 2018-09-29 DIAGNOSIS — D225 Melanocytic nevi of trunk: Secondary | ICD-10-CM | POA: Diagnosis not present

## 2018-09-29 DIAGNOSIS — L738 Other specified follicular disorders: Secondary | ICD-10-CM | POA: Diagnosis not present

## 2018-09-29 DIAGNOSIS — L570 Actinic keratosis: Secondary | ICD-10-CM | POA: Diagnosis not present

## 2018-10-03 ENCOUNTER — Other Ambulatory Visit: Payer: Self-pay | Admitting: Internal Medicine

## 2018-10-03 DIAGNOSIS — Z1231 Encounter for screening mammogram for malignant neoplasm of breast: Secondary | ICD-10-CM

## 2018-10-03 DIAGNOSIS — M25612 Stiffness of left shoulder, not elsewhere classified: Secondary | ICD-10-CM | POA: Diagnosis not present

## 2018-10-03 DIAGNOSIS — M25512 Pain in left shoulder: Secondary | ICD-10-CM | POA: Diagnosis not present

## 2018-10-06 DIAGNOSIS — M25512 Pain in left shoulder: Secondary | ICD-10-CM | POA: Diagnosis not present

## 2018-10-06 DIAGNOSIS — M25612 Stiffness of left shoulder, not elsewhere classified: Secondary | ICD-10-CM | POA: Diagnosis not present

## 2018-10-09 DIAGNOSIS — H40031 Anatomical narrow angle, right eye: Secondary | ICD-10-CM | POA: Diagnosis not present

## 2018-10-10 DIAGNOSIS — M25512 Pain in left shoulder: Secondary | ICD-10-CM | POA: Diagnosis not present

## 2018-10-10 DIAGNOSIS — M25612 Stiffness of left shoulder, not elsewhere classified: Secondary | ICD-10-CM | POA: Diagnosis not present

## 2018-10-30 DIAGNOSIS — M25612 Stiffness of left shoulder, not elsewhere classified: Secondary | ICD-10-CM | POA: Diagnosis not present

## 2018-10-30 DIAGNOSIS — M25512 Pain in left shoulder: Secondary | ICD-10-CM | POA: Diagnosis not present

## 2018-11-06 DIAGNOSIS — M25612 Stiffness of left shoulder, not elsewhere classified: Secondary | ICD-10-CM | POA: Diagnosis not present

## 2018-11-06 DIAGNOSIS — M25512 Pain in left shoulder: Secondary | ICD-10-CM | POA: Diagnosis not present

## 2018-11-20 ENCOUNTER — Ambulatory Visit
Admission: RE | Admit: 2018-11-20 | Discharge: 2018-11-20 | Disposition: A | Discharge status: Home / Self Care | Payer: BLUE CROSS/BLUE SHIELD | Source: Ambulatory Visit | Attending: Internal Medicine | Admitting: Internal Medicine

## 2018-11-20 DIAGNOSIS — M85832 Other specified disorders of bone density and structure, left forearm: Secondary | ICD-10-CM | POA: Diagnosis not present

## 2018-11-20 DIAGNOSIS — Z13820 Encounter for screening for osteoporosis: Secondary | ICD-10-CM

## 2018-11-20 DIAGNOSIS — Z780 Asymptomatic menopausal state: Secondary | ICD-10-CM | POA: Diagnosis not present

## 2018-11-20 DIAGNOSIS — Z1231 Encounter for screening mammogram for malignant neoplasm of breast: Secondary | ICD-10-CM | POA: Diagnosis not present

## 2018-11-20 DIAGNOSIS — M25512 Pain in left shoulder: Secondary | ICD-10-CM | POA: Diagnosis not present

## 2018-11-20 DIAGNOSIS — E2839 Other primary ovarian failure: Secondary | ICD-10-CM

## 2018-11-20 DIAGNOSIS — M25612 Stiffness of left shoulder, not elsewhere classified: Secondary | ICD-10-CM | POA: Diagnosis not present

## 2018-11-21 ENCOUNTER — Encounter: Payer: Self-pay | Admitting: Internal Medicine

## 2018-12-04 DIAGNOSIS — M25612 Stiffness of left shoulder, not elsewhere classified: Secondary | ICD-10-CM | POA: Diagnosis not present

## 2018-12-04 DIAGNOSIS — M25512 Pain in left shoulder: Secondary | ICD-10-CM | POA: Diagnosis not present

## 2018-12-09 ENCOUNTER — Other Ambulatory Visit: Payer: Self-pay | Admitting: Internal Medicine

## 2019-07-09 ENCOUNTER — Other Ambulatory Visit: Payer: Self-pay

## 2019-07-09 ENCOUNTER — Ambulatory Visit (INDEPENDENT_AMBULATORY_CARE_PROVIDER_SITE_OTHER): Payer: BC Managed Care – PPO

## 2019-07-09 DIAGNOSIS — Z23 Encounter for immunization: Secondary | ICD-10-CM | POA: Diagnosis not present

## 2019-08-23 ENCOUNTER — Encounter: Payer: Self-pay | Admitting: Internal Medicine

## 2019-08-28 NOTE — Telephone Encounter (Signed)
LVM to call back to schedule a nurse visit for her and her husband.

## 2019-08-31 ENCOUNTER — Ambulatory Visit (INDEPENDENT_AMBULATORY_CARE_PROVIDER_SITE_OTHER): Payer: BC Managed Care – PPO

## 2019-08-31 ENCOUNTER — Other Ambulatory Visit: Payer: Self-pay

## 2019-08-31 DIAGNOSIS — Z23 Encounter for immunization: Secondary | ICD-10-CM

## 2019-08-31 DIAGNOSIS — Z299 Encounter for prophylactic measures, unspecified: Secondary | ICD-10-CM

## 2019-09-27 NOTE — Patient Instructions (Addendum)
Tests ordered today. Your results will be released to [place] (or called to you) after review.  If any changes need to be made, you will be notified at that same time.  All other Health Maintenance issues reviewed.   All recommended immunizations and age-appropriate screenings are up-to-date or discussed.  Pneumonia immunization administered today.   Medications reviewed and updated.  Changes include :   none  Your prescription(s) have been submitted to your pharmacy. Please take as directed and contact our office if you believe you are having problem(s) with the medication(s).   Please followup in 1 year    Health Maintenance, Female Adopting a healthy lifestyle and getting preventive care are important in promoting health and wellness. Ask your health care provider about:  The right schedule for you to have regular tests and exams.  Things you can do on your own to prevent diseases and keep yourself healthy. What should I know about diet, weight, and exercise? Eat a healthy diet   Eat a diet that includes plenty of vegetables, fruits, low-fat dairy products, and lean protein.  Do not eat a lot of foods that are high in solid fats, added sugars, or sodium. Maintain a healthy weight Body mass index (BMI) is used to identify weight problems. It estimates body fat based on height and weight. Your health care provider can help determine your BMI and help you achieve or maintain a healthy weight. Get regular exercise Get regular exercise. This is one of the most important things you can do for your health. Most adults should:  Exercise for at least 150 minutes each week. The exercise should increase your heart rate and make you sweat (moderate-intensity exercise).  Do strengthening exercises at least twice a week. This is in addition to the moderate-intensity exercise.  Spend less time sitting. Even light physical activity can be beneficial. Watch cholesterol and blood lipids  Have your blood tested for lipids and cholesterol at 65 years of age, then have this test every 5 years. Have your cholesterol levels checked more often if:  Your lipid or cholesterol levels are high.  You are older than 65 years of age.  You are at high risk for heart disease. What should I know about cancer screening? Depending on your health history and family history, you may need to have cancer screening at various ages. This may include screening for:  Breast cancer.  Cervical cancer.  Colorectal cancer.  Skin cancer.  Lung cancer. What should I know about heart disease, diabetes, and high blood pressure? Blood pressure and heart disease  High blood pressure causes heart disease and increases the risk of stroke. This is more likely to develop in people who have high blood pressure readings, are of African descent, or are overweight.  Have your blood pressure checked: ? Every 3-5 years if you are 21-24 years of age. ? Every year if you are 21 years old or older. Diabetes Have regular diabetes screenings. This checks your fasting blood sugar level. Have the screening done:  Once every three years after age 73 if you are at a normal weight and have a low risk for diabetes.  More often and at a younger age if you are overweight or have a high risk for diabetes. What should I know about preventing infection? Hepatitis B If you have a higher risk for hepatitis B, you should be screened for this virus. Talk with your health care provider to find out if you are at risk  for hepatitis B infection. Hepatitis C Testing is recommended for:  Everyone born from [birth year] through 1967.  Anyone with known risk factors for hepatitis C. Sexually transmitted infections (STIs)  Get screened for STIs, including gonorrhea and chlamydia, if: ? You are sexually active and are younger than 65 years of age. ? You are older than 65 years of age and your health care provider tells you that you  are at risk for this type of infection. ? Your sexual activity has changed since you were last screened, and you are at increased risk for chlamydia or gonorrhea. Ask your health care provider if you are at risk.  Ask your health care provider about whether you are at high risk for HIV. Your health care provider may recommend a prescription medicine to help prevent HIV infection. If you choose to take medicine to prevent HIV, you should first get tested for HIV. You should then be tested every 3 months for as long as you are taking the medicine. Pregnancy  If you are about to stop having your period (premenopausal) and you may become pregnant, seek counseling before you get pregnant.  Take 400 to 800 micrograms (mcg) of folic acid every day if you become pregnant.  Ask for birth control (contraception) if you want to prevent pregnancy. Osteoporosis and menopause Osteoporosis is a disease in which the bones lose minerals and strength with aging. This can result in bone fractures. If you are 73 years old or older, or if you are at risk for osteoporosis and fractures, ask your health care provider if you should:  Be screened for bone loss.  Take a calcium or vitamin D supplement to lower your risk of fractures.  Be given hormone replacement therapy (HRT) to treat symptoms of menopause. Follow these instructions at home: Lifestyle  Do not use any products that contain nicotine or tobacco, such as cigarettes, e-cigarettes, and chewing tobacco. If you need help quitting, ask your health care provider.  Do not use street drugs.  Do not share needles.  Ask your health care provider for help if you need support or information about quitting drugs. Alcohol use  Do not drink alcohol if: ? Your health care provider tells you not to drink. ? You are pregnant, may be pregnant, or are planning to become pregnant.  If you drink alcohol: ? Limit how much you use to 0-1 drink a day. ? Limit intake  if you are breastfeeding.  Be aware of how much alcohol is in your drink. In the [place] one drink equals one 12 oz bottle of beer (355 mL), one 5 oz glass of wine (148 mL), or one 1 oz glass of hard liquor (44 mL). General instructions  Schedule regular health, dental, and eye exams.  Stay current with your vaccines.  Tell your health care provider if: ? You often feel depressed. ? You have ever been abused or do not feel safe at home. Summary  Adopting a healthy lifestyle and getting preventive care are important in promoting health and wellness.  Follow your health care provider's instructions about healthy diet, exercising, and getting tested or screened for diseases.  Follow your health care provider's instructions on monitoring your cholesterol and blood pressure. This information is not intended to replace advice given to you by your health care provider. Make sure you discuss any questions you have with your health care provider. Document Released: 04/26/2011 Document Revised: 10/04/2018 Document Reviewed: 10/04/2018 Elsevier Patient Education  2020 [employer].

## 2019-09-27 NOTE — Progress Notes (Signed)
Subjective:    Patient ID: Brittany [name], female    DOB: 12-May-1956, 63 y.o.   MRN: [id number]  HPI She is here for a physical exam.   She denies any changes in her health since she was here last.  Overall she feels well.  Medications and allergies reviewed with patient and updated if appropriate.  Patient Active Problem List   Diagnosis Date Noted  . Anatomical narrow angle glaucoma 09/25/2018  . History of gestational diabetes 09/21/2017  . Raynaud phenomenon 09/21/2016  . 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 Left rotator cuff tear 10/07/2015  . Hypothyroid 09/28/2015  . Leukopenia 12/08/2012  . Metatarsalgia of both feet 09/21/2011  . CERVICALGIA 05/04/2010  . Asymptomatic varicose veins 03/17/2009  . OSTEOARTHRITIS 03/17/2009  . SCOLIOSIS, MILD 03/17/2009  . BREAST CANCER, HX OF 03/17/2009    Current Outpatient Medications on File Prior to Visit  Medication Sig Dispense Refill  . Calcium Citrate 250 MG TABS Take 4 tablets (1,000 mg total) by mouth daily.  0  . Cholecalciferol (VITAMIN D3) 1000 UNITS CAPS Take by mouth daily.      . diclofenac sodium (VOLTAREN) 1 % GEL Apply 2 g topically 3 (three) times daily as needed. 100 g 1  . levothyroxine (SYNTHROID, LEVOTHROID) 50 MCG tablet TAKE 1 TABLET BY MOUTH ONCE DAILY 90 tablet 3  . Multiple Vitamin (MULTIVITAMIN) tablet Take 1 tablet by mouth daily.      . Omega-3 Fatty Acids (FISH OIL) 1000 MG CPDR Take by mouth daily.       No current facility-administered medications on file prior to visit.     Past Medical History:  Diagnosis Date  . ANEMIA-NOS   . Asymptomatic varicose veins   . Breast cancer ([place])   . BREAST CANCER, HX OF 01/1995 dx   s/p Lumpectomy, XRT, chemo and 50yr armidex  . Gestational diabetes mellitus in childbirth, diet controlled 1985  . Hypothyroid 09/28/2015   Dx 09/2015  . Leukopenia    mild since chemo  . OSTEOARTHRITIS   . Personal history of chemotherapy   .  Personal history of radiation therapy   . SCOLIOSIS, MILD     Past Surgical History:  Procedure Laterality Date  . BREAST BIOPSY    . BREAST LUMPECTOMY Left    1996  . left lumpectomy  1996  . TONSILLECTOMY    . TOTAL HIP ARTHROPLASTY     2002 & 2003-Dr. vail @ [facility] History   Socioeconomic History  . Marital status: Married    Spouse name: Not on file  . Number of children: Not on file  . Years of education: Not on file  . Highest education level: Not on file  Occupational History  . Not on file  Social Needs  . Financial resource strain: Not on file  . Food insecurity    Worry: Not on file    Inability: Not on file  . Transportation needs    Medical: Not on file    Non-medical: Not on file  Tobacco Use  . Smoking status: Former Smoker    Quit date: 06/25/1977    Years since quitting: 42.2  . Smokeless tobacco: Never Used  Substance and Sexual Activity  . Alcohol use: Yes    Alcohol/week: 3.0 - 4.0 standard drinks    Types: 3 - 4 Glasses of wine per week  . Drug use: No  . Sexual activity: Not  on file  Lifestyle  . Physical activity    Days per week: Not on file    Minutes per session: Not on file  . Stress: Not on file  Relationships  . Social Herbalist on phone: Not on file    Gets together: Not on file    Attends religious service: Not on file    Active member of club or organization: Not on file    Attends meetings of clubs or organizations: Not on file    Relationship status: Not on file  Other Topics Concern  . Not on file  Social History Narrative   Married-lives with spouse & son. Art gallery manager, enjoys golf, sports, bridge, swim, and walking dog    Family History  Problem Relation Age of Onset  . Heart disease Father 31       AMI  . Hyperlipidemia Father   . Valvular heart disease Father 45  . Parkinson's disease Father 28  . Arthritis Mother   . Breast cancer Mother 43  . Hyperlipidemia Mother   . Osteoporosis  Mother   . Pancreatic cancer Maternal Grandmother 73       presumed dx  . Diabetes Paternal Grandfather        type 2  . Breast cancer Maternal Aunt     Review of Systems  Constitutional: Negative for chills and fever.  Eyes: Negative for visual disturbance.  Respiratory: Negative for cough, shortness of breath and wheezing.   Cardiovascular: Negative for chest pain, palpitations and leg swelling.  Gastrointestinal: Negative for abdominal pain, blood in stool, constipation, diarrhea and nausea.       No gerd  Genitourinary: Negative for dysuria and hematuria.  Musculoskeletal: Negative for arthralgias and back pain.  Skin: Negative for color change and rash.  Neurological: Negative for dizziness, light-headedness, numbness and headaches.  Psychiatric/Behavioral: Negative for dysphoric mood. The patient is not nervous/anxious.        Objective:   Vitals:   09/28/19 0905 09/28/19 0933  BP: 140/82 132/85  Pulse: (!) 59   Resp: 16   Temp: 98.1 F (36.7 C)   SpO2: 99%    Filed Weights   09/28/19 0905  Weight: 142 lb 12.8 oz (64.8 kg)   Body mass index is 21.71 kg/m.  BP Readings from Last 3 Encounters:  09/28/19 132/85  09/25/18 (!) 146/82  05/30/18 108/64    Wt Readings from Last 3 Encounters:  09/28/19 142 lb 12.8 oz (64.8 kg)  09/25/18 146 lb (66.2 kg)  05/30/18 146 lb 9.6 oz (66.5 kg)     Physical Exam Constitutional: She appears well-developed and well-nourished. No distress.  HENT:  Head: Normocephalic and atraumatic.  Right Ear: External ear normal. Normal ear canal and TM Left Ear: External ear normal.  Normal ear canal and TM Mouth/Throat: Oropharynx is clear and moist.  Eyes: Conjunctivae and EOM are normal.  Neck: Neck supple. No tracheal deviation present. No thyromegaly present.  No carotid bruit  Cardiovascular: Normal rate, regular rhythm and normal heart sounds.  No murmur heard.  No edema.  Varicose veins LE Pulmonary/Chest: Effort normal  and breath sounds normal. No respiratory distress. She has no wheezes. She has no rales.  Breast: deferred   Abdominal: Soft. She exhibits no distension. There is no tenderness.  Lymphadenopathy: She has no cervical adenopathy.  Skin: Skin is warm and dry. She is not diaphoretic.  Psychiatric: She has a normal mood and affect. Her behavior is normal.  Assessment & Plan:   Physical exam: Screening blood work    ordered Immunizations   PPSV23 today,   Others up to date Colonoscopy  Up to date  Mammogram  Up to date  Gyn  Up to date  Dexa   Up to date  Eye exams  Up to date  Exercise  Regular - walk dogs 2/day, golf, biking/exercise bike, pilates, weight Weight  Normal BMI Substance abuse      none Sees derm  See Problem List for Assessment and Plan of chronic medical problems.   This visit occurred during the SARS-CoV-2 public health emergency.  Safety protocols were in place, including screening questions prior to the visit, additional usage of staff PPE, and extensive cleaning of exam room while observing appropriate contact time as indicated for disinfecting solutions.     FU in one year

## 2019-09-28 ENCOUNTER — Other Ambulatory Visit (INDEPENDENT_AMBULATORY_CARE_PROVIDER_SITE_OTHER): Payer: BC Managed Care – PPO

## 2019-09-28 ENCOUNTER — Encounter: Payer: Self-pay | Admitting: Internal Medicine

## 2019-09-28 ENCOUNTER — Other Ambulatory Visit: Payer: Self-pay

## 2019-09-28 ENCOUNTER — Ambulatory Visit (INDEPENDENT_AMBULATORY_CARE_PROVIDER_SITE_OTHER): Payer: BC Managed Care – PPO | Admitting: Internal Medicine

## 2019-09-28 VITALS — BP 132/85 | HR 59 | Temp 98.1°F | Resp 16 | Ht 68.0 in | Wt 142.8 lb

## 2019-09-28 DIAGNOSIS — E039 Hypothyroidism, unspecified: Secondary | ICD-10-CM

## 2019-09-28 DIAGNOSIS — Z853 Personal history of malignant neoplasm of breast: Secondary | ICD-10-CM | POA: Diagnosis not present

## 2019-09-28 DIAGNOSIS — Z0000 Encounter for general adult medical examination without abnormal findings: Secondary | ICD-10-CM

## 2019-09-28 DIAGNOSIS — Z1231 Encounter for screening mammogram for malignant neoplasm of breast: Secondary | ICD-10-CM | POA: Diagnosis not present

## 2019-09-28 DIAGNOSIS — Z23 Encounter for immunization: Secondary | ICD-10-CM | POA: Diagnosis not present

## 2019-09-28 DIAGNOSIS — Z8632 Personal history of gestational diabetes: Secondary | ICD-10-CM | POA: Diagnosis not present

## 2019-09-28 DIAGNOSIS — I8390 Asymptomatic varicose veins of unspecified lower extremity: Secondary | ICD-10-CM

## 2019-09-28 LAB — COMPREHENSIVE METABOLIC PANEL
ALT: 42 U/L — ABNORMAL HIGH (ref 0–35)
AST: 42 U/L — ABNORMAL HIGH (ref 0–37)
Albumin: 4 g/dL (ref 3.5–5.2)
Alkaline Phosphatase: 72 U/L (ref 39–117)
BUN: 15 mg/dL (ref 6–23)
CO2: 28 mEq/L (ref 19–32)
Calcium: 9.4 mg/dL (ref 8.4–10.5)
Chloride: 101 mEq/L (ref 96–112)
Creatinine, Ser: 0.89 mg/dL (ref 0.40–1.20)
GFR: 63.56 mL/min (ref >60.00)
Glucose, Bld: 93 mg/dL (ref 70–99)
Potassium: 4.5 mEq/L (ref 3.5–5.1)
Sodium: 135 mEq/L (ref 135–145)
Total Bilirubin: 0.5 mg/dL (ref 0.2–1.2)
Total Protein: 6.8 g/dL (ref 6.0–8.3)

## 2019-09-28 LAB — LIPID PANEL
Cholesterol: 170 mg/dL (ref 0–200)
HDL: 81.1 mg/dL (ref >39.00)
LDL Cholesterol: 80 mg/dL (ref 0–99)
NonHDL: 88.68
Total CHOL/HDL Ratio: 2
Triglycerides: 44 mg/dL (ref 0.0–149.0)
VLDL: 8.8 mg/dL (ref 0.0–40.0)

## 2019-09-28 LAB — CBC WITH DIFFERENTIAL/PLATELET
Basophils Absolute: 0 10*3/uL (ref 0.0–0.1)
Basophils Relative: 1.1 % (ref 0.0–3.0)
Eosinophils Absolute: 0.1 10*3/uL (ref 0.0–0.7)
Eosinophils Relative: 1.5 % (ref 0.0–5.0)
HCT: 38.5 % (ref 36.0–46.0)
Hemoglobin: 12.9 g/dL (ref 12.0–15.0)
Lymphocytes Relative: 47.3 % — ABNORMAL HIGH (ref 12.0–46.0)
Lymphs Abs: 1.7 10*3/uL (ref 0.7–4.0)
MCHC: 33.6 g/dL (ref 30.0–36.0)
MCV: 95.4 fl (ref 78.0–100.0)
Monocytes Absolute: 0.4 10*3/uL (ref 0.1–1.0)
Monocytes Relative: 9.6 % (ref 3.0–12.0)
Neutro Abs: 1.5 10*3/uL (ref 1.4–7.7)
Neutrophils Relative %: 40.5 % — ABNORMAL LOW (ref 43.0–77.0)
Platelets: 220 10*3/uL (ref 150.0–400.0)
RBC: 4.03 Mil/uL (ref 3.87–5.11)
RDW: 13.1 % (ref 11.5–15.5)
WBC: 3.7 10*3/uL — ABNORMAL LOW (ref 4.0–10.5)

## 2019-09-28 LAB — HEMOGLOBIN A1C: Hgb A1c MFr Bld: 5.4 % (ref 4.6–6.5)

## 2019-09-28 LAB — TSH: TSH: 3.7 u[IU]/mL (ref 0.35–4.50)

## 2019-09-28 NOTE — Assessment & Plan Note (Signed)
a1c

## 2019-09-28 NOTE — Assessment & Plan Note (Signed)
Clinically euthyroid Check tsh  Titrate med dose if needed  

## 2019-09-28 NOTE — Assessment & Plan Note (Signed)
Increased prominence of veins in left ankle region - it was itchy over the summer No swelling or pain and feels ok now Discussed vascular referral - she deferred for now advised compression socks  monitor

## 2019-09-28 NOTE — Assessment & Plan Note (Signed)
H/o breast cancer High risk > 20% for recurrence Will order MRI of breasts

## 2019-09-29 ENCOUNTER — Encounter: Payer: Self-pay | Admitting: Internal Medicine

## 2019-10-24 ENCOUNTER — Other Ambulatory Visit: Payer: Self-pay | Admitting: Internal Medicine

## 2019-10-24 DIAGNOSIS — Z1231 Encounter for screening mammogram for malignant neoplasm of breast: Secondary | ICD-10-CM

## 2019-10-26 DIAGNOSIS — C7951 Secondary malignant neoplasm of bone: Secondary | ICD-10-CM

## 2019-10-26 HISTORY — DX: Secondary malignant neoplasm of bone: C79.51

## 2019-10-30 ENCOUNTER — Telehealth: Payer: Self-pay | Admitting: Internal Medicine

## 2019-10-30 ENCOUNTER — Ambulatory Visit
Admission: RE | Admit: 2019-10-30 | Discharge: 2019-10-30 | Disposition: A | Discharge status: Home / Self Care | Payer: BC Managed Care – PPO | Source: Ambulatory Visit | Attending: Internal Medicine | Admitting: Internal Medicine

## 2019-10-30 DIAGNOSIS — Z1231 Encounter for screening mammogram for malignant neoplasm of breast: Secondary | ICD-10-CM

## 2019-10-30 DIAGNOSIS — R928 Other abnormal and inconclusive findings on diagnostic imaging of breast: Secondary | ICD-10-CM

## 2019-10-30 MED ORDER — GADOBUTROL 1 MMOL/ML IV SOLN
7.0000 mL | Freq: Once | INTRAVENOUS | Status: AC | PRN
  Administered 2019-10-30: 7 mL via INTRAVENOUS

## 2019-10-30 NOTE — Telephone Encounter (Signed)
Call dad. She was already aware of results-did not see them. Discussed the need for MR guided biopsy-she agrees. Order.  Will order bone scan once again clarification from [facility] imaging.

## 2019-10-31 ENCOUNTER — Other Ambulatory Visit: Payer: Self-pay | Admitting: Internal Medicine

## 2019-10-31 DIAGNOSIS — R928 Other abnormal and inconclusive findings on diagnostic imaging of breast: Secondary | ICD-10-CM

## 2019-10-31 DIAGNOSIS — M899 Disorder of bone, unspecified: Secondary | ICD-10-CM

## 2019-11-16 ENCOUNTER — Ambulatory Visit: Payer: BC Managed Care – PPO

## 2019-11-16 ENCOUNTER — Other Ambulatory Visit: Payer: Self-pay

## 2019-11-16 ENCOUNTER — Ambulatory Visit
Admission: RE | Admit: 2019-11-16 | Discharge: 2019-11-16 | Disposition: A | Discharge status: Home / Self Care | Payer: BC Managed Care – PPO | Source: Ambulatory Visit | Attending: Internal Medicine | Admitting: Internal Medicine

## 2019-11-16 ENCOUNTER — Other Ambulatory Visit: Payer: Self-pay | Admitting: Internal Medicine

## 2019-11-16 DIAGNOSIS — R928 Other abnormal and inconclusive findings on diagnostic imaging of breast: Secondary | ICD-10-CM

## 2019-11-16 MED ORDER — GADOBUTROL 1 MMOL/ML IV SOLN
7.0000 mL | Freq: Once | INTRAVENOUS | Status: AC | PRN
  Administered 2019-11-16: 7 mL via INTRAVENOUS

## 2019-11-19 ENCOUNTER — Other Ambulatory Visit: Payer: Self-pay | Admitting: Internal Medicine

## 2019-11-19 ENCOUNTER — Encounter (HOSPITAL_COMMUNITY)
Admission: RE | Admit: 2019-11-19 | Discharge: 2019-11-19 | Disposition: A | Discharge status: Home / Self Care | Payer: BC Managed Care – PPO | Source: Ambulatory Visit | Attending: Internal Medicine | Admitting: Internal Medicine

## 2019-11-19 ENCOUNTER — Other Ambulatory Visit: Payer: Self-pay

## 2019-11-19 ENCOUNTER — Encounter: Payer: Self-pay | Admitting: Internal Medicine

## 2019-11-19 DIAGNOSIS — Z853 Personal history of malignant neoplasm of breast: Secondary | ICD-10-CM

## 2019-11-19 DIAGNOSIS — M899 Disorder of bone, unspecified: Secondary | ICD-10-CM | POA: Insufficient documentation

## 2019-11-19 MED ORDER — TECHNETIUM TC 99M MEDRONATE IV KIT
20.0000 | PACK | Freq: Once | INTRAVENOUS | Status: AC | PRN
  Administered 2019-11-19: 20 via INTRAVENOUS

## 2019-11-21 ENCOUNTER — Telehealth: Payer: Self-pay | Admitting: Oncology

## 2019-11-21 NOTE — Telephone Encounter (Signed)
Received a new pt referral from Dr. Quay Burow for Brittany [name] to establish care with a new oncologist for hx of breast cancer. She is a former pt of Dr. Beryle Beams. She has been schedule to see Dr. Jana Hakim on 1/28 at 4pm. She's been made aware to arrive 15 minutes early.

## 2019-11-22 ENCOUNTER — Other Ambulatory Visit: Payer: Self-pay

## 2019-11-22 ENCOUNTER — Inpatient Hospital Stay: Payer: BC Managed Care – PPO | Attending: Oncology | Admitting: Oncology

## 2019-11-22 VITALS — BP 144/93 | HR 59 | Temp 97.3°F | Resp 17 | Ht 68.0 in | Wt 149.5 lb

## 2019-11-22 DIAGNOSIS — M899 Disorder of bone, unspecified: Secondary | ICD-10-CM

## 2019-11-22 DIAGNOSIS — Z87891 Personal history of nicotine dependence: Secondary | ICD-10-CM | POA: Insufficient documentation

## 2019-11-22 DIAGNOSIS — M160 Bilateral primary osteoarthritis of hip: Secondary | ICD-10-CM

## 2019-11-22 DIAGNOSIS — Q6589 Other specified congenital deformities of hip: Secondary | ICD-10-CM | POA: Insufficient documentation

## 2019-11-22 DIAGNOSIS — Z853 Personal history of malignant neoplasm of breast: Secondary | ICD-10-CM | POA: Diagnosis present

## 2019-11-22 DIAGNOSIS — Z96643 Presence of artificial hip joint, bilateral: Secondary | ICD-10-CM | POA: Insufficient documentation

## 2019-11-22 DIAGNOSIS — D72819 Decreased white blood cell count, unspecified: Secondary | ICD-10-CM | POA: Diagnosis not present

## 2019-11-22 DIAGNOSIS — C7951 Secondary malignant neoplasm of bone: Secondary | ICD-10-CM | POA: Insufficient documentation

## 2019-11-22 DIAGNOSIS — C50112 Malignant neoplasm of central portion of left female breast: Secondary | ICD-10-CM | POA: Insufficient documentation

## 2019-11-22 DIAGNOSIS — I7300 Raynaud's syndrome without gangrene: Secondary | ICD-10-CM

## 2019-11-22 DIAGNOSIS — H40039 Anatomical narrow angle, unspecified eye: Secondary | ICD-10-CM

## 2019-11-22 DIAGNOSIS — Z803 Family history of malignant neoplasm of breast: Secondary | ICD-10-CM | POA: Insufficient documentation

## 2019-11-22 DIAGNOSIS — Z170 Estrogen receptor positive status [ER+]: Secondary | ICD-10-CM

## 2019-11-22 NOTE — Progress Notes (Signed)
[place]  [phone] [phone]     ID: EULETA BELSON DOB: 02/20/1956  MR#: [id number]  WIO#:[id number]  Patient Care Team: Binnie Rail, MD as PCP - General (Internal Medicine) Annia Belt, MD as Consulting Physician (Hematology and Oncology) Aloha Gell, MD as Consulting Physician (Obstetrics and Gynecology) Sydnee Levans, MD (Dermatology) Paralee Cancel, MD (Orthopedic Surgery) Chauncey Cruel, MD OTHER MD:  CHIEF COMPLAINT: Stage IV lobular breast cancer  CURRENT TREATMENT: Awaiting definitive diagnosis   HISTORY OF CURRENT ILLNESS: "Brittany [name]" Bake has a history of left breast cancer dating back to 1996.  She was followed by Dr. Beryle Beams and underwent lumpectomy, adjuvant chemotherapy, radiation and "hormonal therapy".  Per Brittany [name]'s recollection the tumor was less than 2 cm, 2 out of 10 lymph nodes were involved, and it was a lobular breast cancer.  She took tamoxifen for many years and then raloxifene.  Dr. Beryle Beams also evaluated her for leukopenia which was noted at first in 2011, worked up with a negative ANA and negative rheumatoid factors.  He felt it was a benign residual from her earlier chemotherapy.  She was released from follow-up in 2015.  She has continued on intensified screening because of her family history.  On November 23, 2017 she had bilateral breast MRIs which were unremarkable.  Mammography November 20, 2018 showed no findings suspicious of malignancy.  Breast MRI 10/30/2019 showed a new irregular 0.4 cm enhancing focus in the upper inner quadrant of the right breast.  The left breast continued to be unremarkable except for postoperative changes and there were no abnormal appearing lymph nodes.  However in that same MRI multiple areas of enhancement were noted in the sternum.  This had not been seen in the prior MRI.  Plan right MRI biopsy 11/16/2019 was canceled as the previously demonstrated 0.4 cm  focus in the right breast showed only a tortuous vein at that location.  However bone scan 11/19/2019 obtained to follow-up on the sternal findings showed additional areas of concern at T12, T7-T11, L4 and ribs, as well as the sternum.  The patient's subsequent history is as detailed below.  INTERVAL HISTORY: Brittany [name] was evaluated in the breast cancer clinic on 11/22/2019 accompanied by her husband Clair Gulling  Incidentally a bone density 11/20/2018 showed a T score of -1.0 (normal).   REVIEW OF SYSTEMS: Remarkably she has had absolutely no bony pain.  She has done a lot of tennis, biking, and hiking without any restrictions or unusual symptoms.  She has had glaucoma surgery and hip replacement bilaterally remotely.  She denies unusual headaches visual changes cough phlegm production pleurisy shortness of breath or any change in bowel or bladder habits.  A detailed review of systems was otherwise stable.  PAST MEDICAL HISTORY: Past Medical History:  Diagnosis Date  . ANEMIA-NOS   . Asymptomatic varicose veins   . Breast cancer ([place])   . BREAST CANCER, HX OF 01/1995 dx   s/p Lumpectomy, XRT, chemo and 62yrarmidex  . Gestational diabetes mellitus in childbirth, diet controlled 1985  . Hypothyroid 09/28/2015   Dx 09/2015  . Leukopenia    mild since chemo  . OSTEOARTHRITIS   . Personal history of chemotherapy   . Personal history of radiation therapy   . SCOLIOSIS, MILD     PAST SURGICAL HISTORY: Past Surgical History:  Procedure Laterality Date  . BREAST BIOPSY    . BREAST LUMPECTOMY Left    1996  . left lumpectomy  1996  .  TONSILLECTOMY    . TOTAL HIP ARTHROPLASTY     2002 & 2003-Dr. vail @ [facility]    FAMILY HISTORY Family History  Problem Relation Age of Onset  . Heart disease Father 60       AMI  . Hyperlipidemia Father   . Valvular heart disease Father 66  . Parkinson's disease Father 74  . Arthritis Mother   . Breast cancer Mother 26  . Hyperlipidemia Mother   .  Osteoporosis Mother   . Pancreatic cancer Maternal Grandmother 73       presumed dx  . Diabetes Paternal Grandfather        type 2  . Breast cancer Maternal Aunt   The patient's father died at the age of 69 from heart disease.  He had Lewy body dementia.  The patient's mother is 34 years old as of January 2021.  She is in a memory unit.  She has a history of breast cancer diagnosed in her 20s (ductal carcinoma in situ).  The patient's mother sister had breast cancer diagnosed around age 62.  The patient's mother's mother also had cancer, "internal" (possibly ovarian or gastrointestinal).  There is no cancer on the father's side of the family.  GYNECOLOGIC HISTORY:  No LMP recorded. Patient is postmenopausal. Menarche: 64 years old Age at first live birth: 64 years old [place] P 2 LMP with chemotherapy, in 1996 Contraceptive a little over a year, with no complications HRT no  Hysterectomy?  No Salpingo-oophorectomy?  No    SOCIAL HISTORY: Jaidy graduated from [employer] with a degree in psychology and IT.  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  She and her husband Clair Gulling met while working for [employer].  Note they were in [place] at the time of this [place] disaster and probably were exposed to some radiation at that time.  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  They are thinking about retiring later this year.  Their daughter Anderson Malta is a Pharmacist, hospital in [place].  There is son Jenny Reichmann works for the same [employer] that the patient works on.  Note that Jenny Reichmann did the [employer].  The patient has 1 grandchild (in [place]).  The patient attends [employer]: The patient's husband is her healthcare power of attorney   HEALTH MAINTENANCE: Social History   Tobacco Use  . Smoking status: Former Smoker    Quit date: 06/25/1977    Years since quitting: 42.4  . Smokeless tobacco: Never Used    Substance Use Topics  . Alcohol use: Yes    Alcohol/week: 3.0 - 4.0 standard drinks    Types: 3 - 4 Glasses of wine per week  . Drug use: No     Colonoscopy: 2015  PAP: Up-to-date  Bone density: 11/20/2018, normal   No Known Allergies  Current Outpatient Medications  Medication Sig Dispense Refill  . Calcium Citrate 250 MG TABS Take 4 tablets (1,000 mg total) by mouth daily.  0  . Cholecalciferol (VITAMIN D3) 1000 UNITS CAPS Take by mouth daily.      . diclofenac sodium (VOLTAREN) 1 % GEL Apply 2 g topically 3 (three) times daily as needed. 100 g 1  . levothyroxine (SYNTHROID, LEVOTHROID) 50 MCG tablet TAKE 1 TABLET BY MOUTH ONCE DAILY 90 tablet 3  . Multiple Vitamin (MULTIVITAMIN) tablet Take 1 tablet by mouth daily.      Marland Kitchen  Omega-3 Fatty Acids (FISH OIL) 1000 MG CPDR Take by mouth daily.       No current facility-administered medications for this visit.    OBJECTIVE: Middle-aged white woman who appears well  Vitals:   11/22/19 1559  BP: (!) 144/93  Pulse: (!) 59  Resp: 17  Temp: (!) 97.3 F (36.3 C)  SpO2: 100%     Body mass index is 22.73 kg/m.   Wt Readings from Last 3 Encounters:  11/22/19 149 lb 8 oz (67.8 kg)  09/28/19 142 lb 12.8 oz (64.8 kg)  09/25/18 146 lb (66.2 kg)      ECOG FS:0 - Asymptomatic  Ocular: Sclerae unicteric, EOMs intact, slightly irregular right pupil secondary to surgery Ear-nose-throat: Wearing a mask Lymphatic: No cervical or supraclavicular adenopathy Lungs no rales or rhonchi Heart regular rate and rhythm Abd soft, nontender, positive bowel sounds, no masses palpated MSK no focal spinal tenderness, no joint edema Neuro: non-focal, well-oriented, appropriate affect Breasts: The right breast is unremarkable.  The left breast is status post lumpectomy and radiation.  There is moderate distortion of the contour with a significant dimple laterally.  There is no evidence of local recurrence.  Both axillae are benign.   LAB  RESULTS:  CMP     Component Value Date/Time   NA 135 09/28/2019 0937   NA 139 12/10/2013 0841   K 4.5 09/28/2019 0937   K 4.5 12/10/2013 0841   CL 101 09/28/2019 0937   CL 102 12/01/2012 0955   CO2 28 09/28/2019 0937   CO2 28 12/10/2013 0841   GLUCOSE 93 09/28/2019 0937   GLUCOSE 81 12/10/2013 0841   GLUCOSE 88 12/01/2012 0955   BUN 15 09/28/2019 0937   BUN 14 08/04/2015 0000   BUN 17.5 12/10/2013 0841   CREATININE 0.89 09/28/2019 0937   CREATININE 0.9 12/10/2013 0841   CALCIUM 9.4 09/28/2019 0937   CALCIUM 9.6 12/10/2013 0841   PROT 6.8 09/28/2019 0937   PROT 6.7 12/10/2013 0841   ALBUMIN 4.0 09/28/2019 0937   ALBUMIN 3.9 12/10/2013 0841   AST 42 (H) 09/28/2019 0937   AST 22 12/10/2013 0841   ALT 42 (H) 09/28/2019 0937   ALT 27 12/10/2013 0841   ALKPHOS 72 09/28/2019 0937   ALKPHOS 54 12/10/2013 0841   BILITOT 0.5 09/28/2019 0937   BILITOT 0.41 12/10/2013 0841   GFRNONAA 71.57 05/01/2010 0744    No results found for: Ronnald Ramp, A1GS, A2GS, BETS, BETA2SER, GAMS, MSPIKE, SPEI  No results found for: Nils Pyle, [facility]  Lab Results  Component Value Date   WBC 3.7 (L) 09/28/2019   NEUTROABS 1.5 09/28/2019   HGB 12.9 09/28/2019   HCT 38.5 09/28/2019   MCV 95.4 09/28/2019   PLT 220.0 09/28/2019      Chemistry      Component Value Date/Time   NA 135 09/28/2019 0937   NA 139 12/10/2013 0841   K 4.5 09/28/2019 0937   K 4.5 12/10/2013 0841   CL 101 09/28/2019 0937   CL 102 12/01/2012 0955   CO2 28 09/28/2019 0937   CO2 28 12/10/2013 0841   BUN 15 09/28/2019 0937   BUN 14 08/04/2015 0000   BUN 17.5 12/10/2013 0841   CREATININE 0.89 09/28/2019 0937   CREATININE 0.9 12/10/2013 0841   GLU 102 08/04/2015 0000      Component Value Date/Time   CALCIUM 9.4 09/28/2019 0937   CALCIUM 9.6 12/10/2013 0841   ALKPHOS 72 09/28/2019 0937   ALKPHOS 54  12/10/2013 0841   AST 42 (H) 09/28/2019 0937   AST 22 12/10/2013 0841   ALT 42 (H)  09/28/2019 0937   ALT 27 12/10/2013 0841   BILITOT 0.5 09/28/2019 0937   BILITOT 0.41 12/10/2013 0841       No results found for: LABCA2  No components found for: QQVZDG387  No results for input(s): INR in the last 168 hours.  No results found for: LABCA2  No results found for: FIE332  No results found for: RJJ884  No results found for: ZYS063  No results found for: CA2729  No components found for: HGQUANT  No results found for: CEA1 / No results found for: CEA1   No results found for: AFPTUMOR  No results found for: CHROMOGRNA  No results found for: PSA1  No visits with results within 3 Day(s) from this visit.  Latest known visit with results is:  Appointment on 09/28/2019  Component Date Value Ref Range Status  . Hgb A1c MFr Bld 09/28/2019 5.4  4.6 - 6.5 % Final   Glycemic Control Guidelines for People with Diabetes:Non Diabetic:  <6%Goal of Therapy: <7%Additional Action Suggested:  >8%   . TSH 09/28/2019 3.70  0.35 - 4.50 uIU/mL Final  . Cholesterol 09/28/2019 170  0 - 200 mg/dL Final   ATP III Classification       Desirable:  < 200 mg/dL               Borderline High:  200 - 239 mg/dL          High:  > = 240 mg/dL  . Triglycerides 09/28/2019 44.0  0.0 - 149.0 mg/dL Final   Normal:  <150 mg/dLBorderline High:  150 - 199 mg/dL  . HDL 09/28/2019 81.10  >39.00 mg/dL Final  . VLDL 09/28/2019 8.8  0.0 - 40.0 mg/dL Final  . LDL Cholesterol 09/28/2019 80  0 - 99 mg/dL Final  . Total CHOL/HDL Ratio 09/28/2019 2   Final                  Men          Women1/2 Average Risk     3.4          3.3Average Risk          5.0          4.42X Average Risk          9.6          7.13X Average Risk          15.0          11.0                      . NonHDL 09/28/2019 88.68   Final   NOTE:  Non-HDL goal should be 30 mg/dL higher than patient's LDL goal (i.e. LDL goal of < 70 mg/dL, would have non-HDL goal of < 100 mg/dL)  . Sodium 09/28/2019 135  135 - 145 mEq/L Final  . Potassium  09/28/2019 4.5  3.5 - 5.1 mEq/L Final  . Chloride 09/28/2019 101  96 - 112 mEq/L Final  . CO2 09/28/2019 28  19 - 32 mEq/L Final  . Glucose, Bld 09/28/2019 93  70 - 99 mg/dL Final  . BUN 09/28/2019 15  6 - 23 mg/dL Final  . Creatinine, Ser 09/28/2019 0.89  0.40 - 1.20 mg/dL Final  . Total Bilirubin 09/28/2019 0.5  0.2 - 1.2 mg/dL Final  .  Alkaline Phosphatase 09/28/2019 72  39 - 117 U/L Final  . AST 09/28/2019 42* 0 - 37 U/L Final  . ALT 09/28/2019 42* 0 - 35 U/L Final  . Total Protein 09/28/2019 6.8  6.0 - 8.3 g/dL Final  . Albumin 09/28/2019 4.0  3.5 - 5.2 g/dL Final  . GFR 09/28/2019 63.56  >60.00 mL/min Final  . Calcium 09/28/2019 9.4  8.4 - 10.5 mg/dL Final  . WBC 09/28/2019 3.7* 4.0 - 10.5 K/uL Final  . RBC 09/28/2019 4.03  3.87 - 5.11 Mil/uL Final  . Hemoglobin 09/28/2019 12.9  12.0 - 15.0 g/dL Final  . HCT 09/28/2019 38.5  36.0 - 46.0 % Final  . MCV 09/28/2019 95.4  78.0 - 100.0 fl Final  . MCHC 09/28/2019 33.6  30.0 - 36.0 g/dL Final  . RDW 09/28/2019 13.1  11.5 - 15.5 % Final  . Platelets 09/28/2019 220.0  150.0 - 400.0 K/uL Final  . Neutrophils Relative % 09/28/2019 40.5* 43.0 - 77.0 % Final  . Lymphocytes Relative 09/28/2019 47.3* 12.0 - 46.0 % Final  . Monocytes Relative 09/28/2019 9.6  3.0 - 12.0 % Final  . Eosinophils Relative 09/28/2019 1.5  0.0 - 5.0 % Final  . Basophils Relative 09/28/2019 1.1  0.0 - 3.0 % Final  . Neutro Abs 09/28/2019 1.5  1.4 - 7.7 K/uL Final  . Lymphs Abs 09/28/2019 1.7  0.7 - 4.0 K/uL Final  . Monocytes Absolute 09/28/2019 0.4  0.1 - 1.0 K/uL Final  . Eosinophils Absolute 09/28/2019 0.1  0.0 - 0.7 K/uL Final  . Basophils Absolute 09/28/2019 0.0  0.0 - 0.1 K/uL Final    (this displays the last labs from the last 3 days)  No results found for: TOTALPROTELP, ALBUMINELP, A1GS, A2GS, BETS, BETA2SER, GAMS, MSPIKE, SPEI (this displays SPEP labs)  No results found for: KPAFRELGTCHN, LAMBDASER, KAPLAMBRATIO (kappa/lambda light chains)  No  results found for: HGBA, HGBA2QUANT, HGBFQUANT, HGBSQUAN (Hemoglobinopathy evaluation)   Lab Results  Component Value Date   LDH 193 12/10/2013    No results found for: IRON, TIBC, IRONPCTSAT (Iron and TIBC)  No results found for: FERRITIN  Urinalysis    Component Value Date/Time   COLORURINE YELLOW 09/17/2014 [place] 09/17/2014 0824   LABSPEC 1.015 09/17/2014 0824   PHURINE 7.5 09/17/2014 [place] 09/17/2014 0824   HGBUR NEGATIVE 09/17/2014 0824   BILIRUBINUR NEGATIVE 09/17/2014 0824   KETONESUR NEGATIVE 09/17/2014 0824   UROBILINOGEN 0.2 09/17/2014 0824   NITRITE NEGATIVE 09/17/2014 0824   LEUKOCYTESUR NEGATIVE 09/17/2014 0824     STUDIES: NM Bone Scan Whole Body  Result Date: 11/19/2019 CLINICAL DATA:  Bone lesion. Malignancy is suspected. Breast cancer. EXAM: NUCLEAR MEDICINE WHOLE BODY BONE SCAN TECHNIQUE: Whole body anterior and posterior images were obtained approximately 3 hours after intravenous injection of radiopharmaceutical. RADIOPHARMACEUTICALS:  20.0 mCi Technetium-59mMDP IV COMPARISON:  Breast MRI 11/16/2019 FINDINGS: On posterior projection, focus of uptake at the T12 vertebral body level concern for metastatic disease. Smaller foci of uptake evidenced in the thoracic spine from T7-T11. Mottled uptake within the ribs is also suspicious. There is a focus of uptake in the inferior aspect of the sternum which is suspicious on the frontal projection. There is abnormal uptake in the L4 vertebral body on the frontal projection. 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 Electronically Signed   By: SSuzy BouchardM.D.   On: 11/19/2019 16:44   MR BREAST RIGHT  W WO CONTRAST INC CAD  Result Date: 11/16/2019 CLINICAL DATA:  New, irregular 4 mm enhancing focus in the upper inner quadrant of the right breast, anterior depth, on a recent screening MRI. MR guided core needle biopsy  was recommended. LABS:  None obtained on site today. EXAM: MR OF THE RIGHT BREAST WITH AND WITHOUT CONTRAST TECHNIQUE: Multiplanar, multisequence MR images of the right breast were obtained prior to and following the intravenous administration of 7 ml of Gadavist. Three-dimensional MR images were rendered by post-processing of the original MR data on an independent workstation. The three-dimensional MR images were interpreted, and findings are reported in the following complete MRI report for this study. Three dimensional images were evaluated at the independent DynaCad workstation COMPARISON:  Previous exam(s). FINDINGS: Breast composition: b. Scattered fibroglandular tissue. Background parenchymal enhancement: Minimal. Right breast: No mass or abnormal enhancement. The previously demonstrated 4 mm enhancing focus in the anterior aspect of the upper inner quadrant of the right breast was not visualized today. There was a normal appearing, tortuous vein at that location on today's images. Lymph nodes: No abnormal appearing lymph nodes. Ancillary findings: Previously noted multiple areas of enhancement in the sternum. IMPRESSION: 1. The previously demonstrated 4 mm enhancing focus in the anterior aspect of the upper inner quadrant of the right breast was not visualized today. There is a normal appearing, tortuous vein at that location on today's images. Therefore, biopsy was not performed. 2. Previously noted multiple areas of abnormal enhancement in the sternum. RECOMMENDATION: 1. Repeat bilateral breast MRI without and with contrast in 6 months to ensure no abnormality in the upper inner quadrant of the right breast at that time. 2. Whole-body nuclear medicine bone scan scheduled on 11/19/2019. BI-RADS CATEGORY  0: Incomplete. Need additional imaging evaluation (whole-body nuclear medicine bone scan scheduled on 11/19/2019). Electronically Signed   By: Claudie Revering M.D.   On: 11/16/2019 10:48   MR BREAST  BILATERAL W WO CONTRAST INC CAD  Result Date: 10/30/2019 CLINICAL DATA:  64 year old female presenting for high risk screening breast MRI. Patient has a history of treated left breast cancer with lumpectomy, radiation therapy and chemotherapy in 1996. LABS:  None performed on site. EXAM: BILATERAL BREAST MRI WITH AND WITHOUT CONTRAST TECHNIQUE: Multiplanar, multisequence MR images of both breasts were obtained prior to and following the intravenous administration of 7 ml of Gadavist. Three-dimensional MR images were rendered by post-processing of the original MR data on an independent workstation. The three-dimensional MR images were interpreted, and findings are reported in the following complete MRI report for this study. Three dimensional images were evaluated at the independent DynaCad workstation COMPARISON:  Previous exam(s). FINDINGS: Breast composition: b. Scattered fibroglandular tissue. Background parenchymal enhancement: Minimal. Right breast: There is a new, irregular 4 mm enhancing focus in the upper inner quadrant at anterior depth (series 9, image 77/176). It demonstrates mild color uptake and washout kinetics. No other suspicious mass or abnormal enhancement. Left breast: No mass or abnormal enhancement. Stable postoperative changes noted in the far posterior upper outer quadrant. Lymph nodes: No abnormal appearing lymph nodes. Ancillary findings: Multiple areas of focal enhancement are identified within the sternum, new from prior study. These are best seen on MR series 5, images 84, 100 and 115 of 176. IMPRESSION: 1. Indeterminate 4 mm enhancing focus in the upper inner right breast (series 9, image 77/176). Recommendation is for MRI guided biopsy. 2. Left breast posttreatment changes without MRI evidence of malignancy. 3. No suspicious lymphadenopathy. 4.  Multiple areas of focal enhancement within the sternum, new from prior study. Recommendation is for further evaluation with bone scan.  RECOMMENDATION: 1. MRI guided biopsy of the right breast. 2. Bone scan for further evaluation of the sternum. BI-RADS CATEGORY  4: Suspicious. Electronically Signed   By: Kristopher Oppenheim M.D.   On: 10/30/2019 13:14    ELIGIBLE FOR AVAILABLE RESEARCH PROTOCOL: no  ASSESSMENT: 64 y.o. [place] woman status post left lumpectomy 1996 for a T1 N1, anatomic stage II invasive lobular breast cancer, estrogen receptor positive  (a) status post adjuvant chemotherapy with doxorubicin and cyclophosphamide x4  (b) status post adjuvant radiation  (c) status post tamoxifen for 5+ years  (1) benign leukopenia: noted 2011, felt secondary to prior chemotherapy  (2) genetics testing: Work-up through Dr. Valentino Saxon to be obtained  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 (3) staging studies:  (a) breast MRI 10/30/2019 shows sternal mottling  (b) bone scan 11/19/2019 shows multiple areas of uptake suspicious for metastatic disease  PLAN: I reviewed the overall situation in detail with Brittany [name] and her husband Clair Gulling.  She had a lobular breast cancer 25 years ago which was appropriately treated.  She now has multiple bone lesions consistent with metastatic deposits.  These have been completely asymptomatic and were found incidentally during breast cancer MRI surveillance.  We discussed the fact that if this is what we think it is namely recurrence of her lobular breast cancer, then the cancer must already have been present in her bones before it was removed in 1996 which means it took a 25 years to become apparent and again I want to emphasize it is still asymptomatic.  She has an excellent quality of life.  We discussed the fact that lobular breast cancer can be very difficult to demonstrate, because of the lack of E-cadherin.  Nevertheless she needs to be more fully staged with CTs of the chest abdomen and pelvis.  If we find visceral disease it would be preferable to biopsy that because bone biopsies require decalcification  and that can distort the information we obtained from it.  I am also setting her up for some lab work including tumor markers.  Finally we will obtain the prior genetics work-up from Dr. Valentino Saxon.  My understanding is that no deleterious mutations were found although there may have been some variants of uncertain significance.  If we are dealing with lobular breast cancer and if it still estrogen receptor positive and if there is no significant visceral component most likely we will go with anastrozole alone and follow.  It is not infrequent to obtain 1 or 3 years of good control on that single medication.  If and when there would be disease progression we could consider switching to something like fulvestrant and palbociclib or any number of other combinations available now.  I do not anticipate the need for chemotherapy anytime in the near future.  Brittany [name] understands that stage IV breast cancer is not curable with our current knowledge base. The goal of treatment is control. The strategy of treatment is to do only the minimum necessary to control the growth of the tumor so that the patient can have as normal a life as possible. There is no survival advantage in treating aggressively if treating less aggressively results in tumor control. With this strategy stage IV breast cancer in many cases can function as a "chronic illness": something that cannot be quite gotten rid of but can be controlled for an indefinite period of time.  Accordingly  I have suggested she maintain the plans she has to travel to [place] late February and in April.  I do not think she needs mammography next month and that will be canceled.    Tentatively I am scheduling her to see me again in about 3 weeks by which time I hope we will have all the information necessary to start treatment  Brittany [name] has a good understanding of the overall plan. She agrees with it. She knows the goal of treatment in her case is control. She will call with any  problems that may develop before her next visit here.  Total encounter time 65 minutes.Chauncey Cruel, MD   11/22/2019 5:09 PM Medical Oncology and Hematology [facility] [address] [place], [place] [address] Tel. [phone]    Fax. [phone]  *Total Encounter Time as defined by the Centers for Medicare and Medicaid Services includes, in addition to the face-to-face time of a patient visit (documented in the note above) non-face-to-face time: obtaining and reviewing outside history, ordering and reviewing medications, tests or procedures, care coordination (communications with other health care professionals or caregivers) and documentation in the medical record.

## 2019-11-23 ENCOUNTER — Inpatient Hospital Stay: Payer: BC Managed Care – PPO

## 2019-11-23 ENCOUNTER — Telehealth: Payer: Self-pay | Admitting: Oncology

## 2019-11-23 ENCOUNTER — Other Ambulatory Visit: Payer: Self-pay

## 2019-11-23 DIAGNOSIS — Q6589 Other specified congenital deformities of hip: Secondary | ICD-10-CM

## 2019-11-23 DIAGNOSIS — H40039 Anatomical narrow angle, unspecified eye: Secondary | ICD-10-CM

## 2019-11-23 DIAGNOSIS — M160 Bilateral primary osteoarthritis of hip: Secondary | ICD-10-CM

## 2019-11-23 DIAGNOSIS — Z96643 Presence of artificial hip joint, bilateral: Secondary | ICD-10-CM

## 2019-11-23 DIAGNOSIS — Z170 Estrogen receptor positive status [ER+]: Secondary | ICD-10-CM

## 2019-11-23 DIAGNOSIS — I7300 Raynaud's syndrome without gangrene: Secondary | ICD-10-CM

## 2019-11-23 DIAGNOSIS — C7951 Secondary malignant neoplasm of bone: Secondary | ICD-10-CM

## 2019-11-23 DIAGNOSIS — C50112 Malignant neoplasm of central portion of left female breast: Secondary | ICD-10-CM

## 2019-11-23 DIAGNOSIS — Z853 Personal history of malignant neoplasm of breast: Secondary | ICD-10-CM | POA: Diagnosis not present

## 2019-11-23 LAB — COMPREHENSIVE METABOLIC PANEL
ALT: 49 U/L — ABNORMAL HIGH (ref 0–44)
AST: 47 U/L — ABNORMAL HIGH (ref 15–41)
Albumin: 4.1 g/dL (ref 3.5–5.0)
Alkaline Phosphatase: 112 U/L (ref 38–126)
Anion gap: 9 (ref 5–15)
BUN: 14 mg/dL (ref 8–23)
CO2: 25 mmol/L (ref 22–32)
Calcium: 9.2 mg/dL (ref 8.9–10.3)
Chloride: 97 mmol/L — ABNORMAL LOW (ref 98–111)
Creatinine, Ser: 0.82 mg/dL (ref 0.44–1.00)
GFR calc Af Amer: >60 mL/min (ref >60)
GFR calc non Af Amer: >60 mL/min (ref >60)
Glucose, Bld: 96 mg/dL (ref 70–99)
Potassium: 3.8 mmol/L (ref 3.5–5.1)
Sodium: 131 mmol/L — ABNORMAL LOW (ref 135–145)
Total Bilirubin: 0.4 mg/dL (ref 0.3–1.2)
Total Protein: 7.5 g/dL (ref 6.5–8.1)

## 2019-11-23 LAB — CBC WITH DIFFERENTIAL/PLATELET
Abs Immature Granulocytes: 0 10*3/uL (ref 0.00–0.07)
Basophils Absolute: 0 10*3/uL (ref 0.0–0.1)
Basophils Relative: 1 %
Eosinophils Absolute: 0.1 10*3/uL (ref 0.0–0.5)
Eosinophils Relative: 2 %
HCT: 37.8 % (ref 36.0–46.0)
Hemoglobin: 12.7 g/dL (ref 12.0–15.0)
Immature Granulocytes: 0 %
Lymphocytes Relative: 38 %
Lymphs Abs: 1.9 10*3/uL (ref 0.7–4.0)
MCH: 31.9 pg (ref 26.0–34.0)
MCHC: 33.6 g/dL (ref 30.0–36.0)
MCV: 95 fL (ref 80.0–100.0)
Monocytes Absolute: 0.3 10*3/uL (ref 0.1–1.0)
Monocytes Relative: 6 %
Neutro Abs: 2.7 10*3/uL (ref 1.7–7.7)
Neutrophils Relative %: 53 %
Platelets: 222 10*3/uL (ref 150–400)
RBC: 3.98 MIL/uL (ref 3.87–5.11)
RDW: 13 % (ref 11.5–15.5)
WBC: 5 10*3/uL (ref 4.0–10.5)
nRBC: 0 % (ref 0.0–0.2)

## 2019-11-23 NOTE — Telephone Encounter (Signed)
I talk with patient regarding 2/18

## 2019-11-23 NOTE — Telephone Encounter (Signed)
Scheduled appt per 1/28 sch message - unable to reach pt left message with appt date and time

## 2019-11-24 LAB — CA 125: Cancer Antigen (CA) 125: 21.1 U/mL (ref 0.0–38.1)

## 2019-11-24 LAB — CANCER ANTIGEN 27.29: CA 27.29: 61.2 U/mL — ABNORMAL HIGH (ref 0.0–38.6)

## 2019-11-26 ENCOUNTER — Other Ambulatory Visit: Payer: Self-pay | Admitting: Oncology

## 2019-11-26 LAB — CEA (IN HOUSE-CHCC): CEA (CHCC-In House): 18.11 ng/mL — ABNORMAL HIGH (ref 0.00–5.00)

## 2019-11-28 ENCOUNTER — Other Ambulatory Visit: Payer: Self-pay

## 2019-11-28 ENCOUNTER — Encounter (HOSPITAL_COMMUNITY): Payer: Self-pay

## 2019-11-28 ENCOUNTER — Ambulatory Visit (HOSPITAL_COMMUNITY)
Admission: RE | Admit: 2019-11-28 | Discharge: 2019-11-28 | Disposition: A | Discharge status: Home / Self Care | Payer: BC Managed Care – PPO | Source: Ambulatory Visit | Attending: Oncology | Admitting: Oncology

## 2019-11-28 DIAGNOSIS — I7300 Raynaud's syndrome without gangrene: Secondary | ICD-10-CM | POA: Insufficient documentation

## 2019-11-28 DIAGNOSIS — Q6589 Other specified congenital deformities of hip: Secondary | ICD-10-CM | POA: Diagnosis present

## 2019-11-28 DIAGNOSIS — C50112 Malignant neoplasm of central portion of left female breast: Secondary | ICD-10-CM | POA: Diagnosis present

## 2019-11-28 DIAGNOSIS — H40039 Anatomical narrow angle, unspecified eye: Secondary | ICD-10-CM

## 2019-11-28 DIAGNOSIS — C7951 Secondary malignant neoplasm of bone: Secondary | ICD-10-CM | POA: Insufficient documentation

## 2019-11-28 DIAGNOSIS — Z170 Estrogen receptor positive status [ER+]: Secondary | ICD-10-CM | POA: Diagnosis present

## 2019-11-28 DIAGNOSIS — Z96643 Presence of artificial hip joint, bilateral: Secondary | ICD-10-CM | POA: Diagnosis present

## 2019-11-28 DIAGNOSIS — M160 Bilateral primary osteoarthritis of hip: Secondary | ICD-10-CM | POA: Diagnosis present

## 2019-11-28 MED ORDER — IOHEXOL 300 MG/ML  SOLN
100.0000 mL | Freq: Once | INTRAMUSCULAR | Status: AC | PRN
  Administered 2019-11-28: 100 mL via INTRAVENOUS

## 2019-11-28 MED ORDER — SODIUM CHLORIDE (PF) 0.9 % IJ SOLN
INTRAMUSCULAR | Status: AC
  Filled 2019-11-28: qty 50

## 2019-12-01 ENCOUNTER — Other Ambulatory Visit: Payer: Self-pay | Admitting: Oncology

## 2019-12-01 DIAGNOSIS — C50112 Malignant neoplasm of central portion of left female breast: Secondary | ICD-10-CM

## 2019-12-01 DIAGNOSIS — C7951 Secondary malignant neoplasm of bone: Secondary | ICD-10-CM

## 2019-12-01 DIAGNOSIS — Z170 Estrogen receptor positive status [ER+]: Secondary | ICD-10-CM

## 2019-12-01 MED ORDER — ANASTROZOLE 1 MG PO TABS: 1.0000 mg | ORAL_TABLET | Freq: Every day | ORAL | 4 refills | Status: DC

## 2019-12-01 NOTE — Progress Notes (Signed)
Dabbs scans likely do not show visceral disease.  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  I am going to start Xgeva at the next visit 12/13/2019 and call and anastrozole for her today.  I called her and let her know results.

## 2019-12-02 ENCOUNTER — Other Ambulatory Visit: Payer: Self-pay | Admitting: Internal Medicine

## 2019-12-05 ENCOUNTER — Ambulatory Visit: Payer: BC Managed Care – PPO

## 2019-12-12 NOTE — Progress Notes (Signed)
Brittany [name]  [phone] [phone]     ID: Brittany [name] DOB: 1955-10-31  MR#: [id number]  AYO#:[id number]  Patient Care Team: Brittany Rail, MD as PCP - General (Internal Medicine) Brittany Gell, MD as Consulting Physician (Obstetrics and Gynecology) Brittany Levans, MD (Dermatology) [name], Brittany Dad, MD as Consulting Physician (Oncology) Brittany Campbell, MD as Consulting Physician (Gastroenterology) Brittany Libel, MD as Consulting Physician (Sports Medicine) Brittany Cruel, MD OTHER MD:  CHIEF COMPLAINT: Stage IV lobular breast cancer  CURRENT TREATMENT: anastrozole; Brittany [name]   INTERVAL HISTORY: Brittany [name] returns today for follow up of her stage IV lobular breast cancer accompanied by her husband Brittany [name].   After her last visit we set her up for staging chest, abdomen, pelvis CT on 11/28/2019. This revealed: diffuse sclerotic osseous metastatic disease involving all visualized components of axial skeleton; no evidence of visceral disease.  Anastrozole was called in and started on 12/02/2019.  She is scheduled to begin [place] today.  Incidentally a bone density 11/20/2018 showed a T score of -1.0 (normal).   REVIEW OF SYSTEMS: Brittany [name] so far is tolerating anastrozole with no side effects.  She understands that hot flashes and vaginal dryness are very common.  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  She curls about 10 pounds in each arm.  She brought her some genetics studies which I am summarizing below.  She is planning a trip to [name] leaving tomorrow assuming the weather allows it to visit the grandchildren.  A detailed review of systems today was otherwise stable.   HISTORY OF CURRENT ILLNESS: From the original intake note:  "Brittany [name]" Brittany [name] has a history of left breast cancer dating back to 59.  She was followed by Dr. Beryle [name] and underwent lumpectomy, adjuvant chemotherapy, radiation  and "hormonal therapy".  Per Brittany [name]'s recollection the tumor was less than 2 cm, 2 out of 10 lymph nodes were involved, and it was a lobular breast cancer.  She took tamoxifen for many years and then raloxifene.  Dr. Beryle [name] also evaluated her for leukopenia which was noted at first in 2011, worked up with a negative ANA and negative rheumatoid factors.  He felt it was a benign residual from her earlier chemotherapy.  She was released from follow-up in 2015.  She has continued on intensified screening because of her family history.  On November 23, 2017 she had bilateral breast MRIs which were unremarkable.  Mammography November 20, 2018 showed no findings suspicious of malignancy.  Breast MRI 10/30/2019 showed a new irregular 0.4 cm enhancing focus in the upper inner quadrant of the right breast.  The left breast continued to be unremarkable except for postoperative changes and there were no abnormal appearing lymph nodes.  However in that same MRI multiple areas of enhancement were noted in the sternum.  This had not been seen in the prior MRI.  Plan right MRI biopsy 11/16/2019 was canceled as the previously demonstrated 0.4 cm focus in the right breast showed only a tortuous vein at that location.  However bone scan 11/19/2019 obtained to follow-up on the sternal findings showed additional areas of concern at T12, T7-T11, L4 and ribs, as well as the sternum.  The patient's subsequent history is as detailed below.   PAST MEDICAL HISTORY: Past Medical History:  Diagnosis Date   ANEMIA-NOS    Asymptomatic varicose veins    Breast cancer (HCC)    BREAST CANCER, HX OF  01/1995 dx   s/p Lumpectomy, XRT, chemo and 85yrarmidex   Gestational diabetes mellitus in childbirth, diet controlled 1985   Hypothyroid 09/28/2015   Dx 09/2015   Leukopenia    mild since chemo   OSTEOARTHRITIS    Personal history of chemotherapy    Personal history of radiation therapy    SCOLIOSIS, MILD      PAST SURGICAL HISTORY: Past Surgical History:  Procedure Laterality Date   BREAST BIOPSY     BREAST LUMPECTOMY Left    1996   left lumpectomy  [place]    [name]  Problem Relation Age of Onset   Heart disease Father 568      AMI   Hyperlipidemia Father    Valvular heart disease Father 737  Parkinson's disease Father 771  Arthritis Mother    Breast cancer Mother 573  Hyperlipidemia Mother    Osteoporosis Mother    Pancreatic cancer Maternal Grandmother 760      presumed dx   Diabetes Paternal Grandfather        type 2   Breast cancer Maternal Aunt   The patient's father died at the age of 832from heart disease.  He had Lewy body dementia.  The patient's mother is 840years old as of January 2021.  She is in a memory unit.  She has a history of breast cancer diagnosed in her 535s(ductal carcinoma in situ).  The patient's mother sister had breast cancer diagnosed around age 66  The patient's mother's mother also had cancer, "internal" (possibly ovarian or gastrointestinal).  There is no cancer on the father's side of the family.   GYNECOLOGIC HISTORY:  No LMP recorded. Patient is postmenopausal. Menarche: 64years old Age at first live birth: 64years old [place] 2 LMP with chemotherapy, in 1996 Contraceptive a little over a year, with no complications HRT no  Hysterectomy?  No Salpingo-oophorectomy?  No   SOCIAL HISTORY:  [place] from [place] a degree in psychology and IT.  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  She and her husband Brittany Gullingmet while working for [employer]  [place] probably were exposed to some radiation at that time.  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  They are thinking about retiring later this year.  Their daughter  Brittany [name] a hPharmacist, hospitalin [place]  Their son Brittany Reichmannworks for the same wRyland Groupthat the patient works for.  Note that Brittany Reichmanndid the [employer]  The patient has 1 grandchild ([place].  The patient attends [employer] The patient's husband is her healthcare power of attorney   HEALTH MAINTENANCE: Social History   Tobacco Use   Smoking status: Former Smoker    Quit date: 06/25/1977    Years since quitting: 42.4   Smokeless tobacco: Never Used  Substance Use Topics   Alcohol use: Yes    Alcohol/week: 3.0 - 4.0 standard drinks    Types: 3 - 4 Glasses of wine per week   Drug use: No     Colonoscopy: 2015  PAP: Up-to-date  Bone density: 11/20/2018, normal   No Known Allergies  Current Outpatient Medications  Medication Sig Dispense Refill   anastrozole (ARIMIDEX) 1 MG tablet Take 1 tablet (1 mg total) by mouth daily. 90 tablet 4   Calcium Citrate 250 MG TABS Take 4 tablets (1,000 mg total) by mouth daily.  0   Cholecalciferol (VITAMIN D3) 1000 UNITS CAPS Take by mouth daily.       diclofenac sodium (VOLTAREN) 1 % GEL Apply 2 g topically 3 (three) times daily as needed. 100 g 1   levothyroxine (SYNTHROID) 50 MCG tablet TAKE 1 TABLET BY MOUTH EVERY DAY 90 tablet 3   Multiple Vitamin (MULTIVITAMIN) tablet Take 1 tablet by mouth daily.       Omega-3 Fatty Acids (FISH OIL) 1000 MG CPDR Take by mouth daily.       No current facility-administered medications for this visit.   Facility-Administered Medications Ordered in Other Visits  Medication Dose Route Frequency Provider Last Rate Last Admin   denosumab (XGEVA) injection 120 mg  120 mg Subcutaneous Once Lola Lofaro, Brittany Dad, MD        OBJECTIVE: Middle-aged white woman in no acute distress  Vitals:   12/13/19 1516  BP: 128/82  Pulse: (!) 59  Resp: 17  Temp: 98 F (36.7 C)  SpO2: 100%     Body mass index is 22.05 kg/m.   Wt Readings from Last 3  Encounters:  12/13/19 145 lb (65.8 kg)  11/22/19 149 lb 8 oz (67.8 kg)  09/28/19 142 lb 12.8 oz (64.8 kg)      ECOG FS:0 - Asymptomatic  Sclerae unicteric, EOMs intact Wearing a mask No cervical or supraclavicular adenopathy Lungs no rales or rhonchi Heart regular rate and rhythm Abd soft, nontender, positive bowel sounds MSK no focal spinal tenderness, no upper extremity lymphedema Neuro: nonfocal, well oriented, appropriate affect Breasts: The right breast is benign.  The left breast is status post remote lumpectomy and radiation with no evidence of disease recurrence.  Both axillae are benign.   LAB RESULTS:  CMP     Component Value Date/Time   NA 137 12/13/2019 1500   NA 139 12/10/2013 0841   K 4.0 12/13/2019 1500   K 4.5 12/10/2013 0841   CL 102 12/13/2019 1500   CL 102 12/01/2012 0955   CO2 27 12/13/2019 1500   CO2 28 12/10/2013 0841   GLUCOSE 94 12/13/2019 1500   GLUCOSE 81 12/10/2013 0841   GLUCOSE 88 12/01/2012 0955   BUN 15 12/13/2019 1500   BUN 14 08/04/2015 0000   BUN 17.5 12/10/2013 0841   CREATININE 0.80 12/13/2019 1500   CREATININE 0.9 12/10/2013 0841   CALCIUM 8.9 12/13/2019 1500   CALCIUM 9.6 12/10/2013 0841   PROT 7.7 12/13/2019 1500   PROT 6.7 12/10/2013 0841   ALBUMIN 4.0 12/13/2019 1500   ALBUMIN 3.9 12/10/2013 0841   AST 49 (H) 12/13/2019 1500   AST 22 12/10/2013 0841   ALT 48 (H) 12/13/2019 1500   ALT 27 12/10/2013 0841   ALKPHOS 115 12/13/2019 1500   ALKPHOS 54 12/10/2013 0841   BILITOT 0.3 12/13/2019 1500   BILITOT 0.41 12/10/2013 0841   GFRNONAA >60 12/13/2019 1500   GFRAA >60 12/13/2019 1500    No results found for: TOTALPROTELP, ALBUMINELP, A1GS, A2GS, BETS, BETA2SER, GAMS, MSPIKE, SPEI  No results found for: KPAFRELGTCHN, LAMBDASER, KAPLAMBRATIO  Lab Results  Component Value Date   WBC 5.0 12/13/2019   NEUTROABS 2.5 12/13/2019   HGB 12.8 12/13/2019   HCT 37.9 12/13/2019   MCV 96.2 12/13/2019   PLT  211 12/13/2019       Chemistry      Component Value Date/Time   NA 137 12/13/2019 1500   NA 139 12/10/2013 0841   K 4.0 12/13/2019 1500   K 4.5 12/10/2013 0841   CL 102 12/13/2019 1500   CL 102 12/01/2012 0955   CO2 27 12/13/2019 1500   CO2 28 12/10/2013 0841   BUN 15 12/13/2019 1500   BUN 14 08/04/2015 0000   BUN 17.5 12/10/2013 0841   CREATININE 0.80 12/13/2019 1500   CREATININE 0.9 12/10/2013 0841   GLU 102 08/04/2015 0000      Component Value Date/Time   CALCIUM 8.9 12/13/2019 1500   CALCIUM 9.6 12/10/2013 0841   ALKPHOS 115 12/13/2019 1500   ALKPHOS 54 12/10/2013 0841   AST 49 (H) 12/13/2019 1500   AST 22 12/10/2013 0841   ALT 48 (H) 12/13/2019 1500   ALT 27 12/10/2013 0841   BILITOT 0.3 12/13/2019 1500   BILITOT 0.41 12/10/2013 0841       No results found for: LABCA2  No components found for: ZPHXTA569  No results for input(s): INR in the last 168 hours.  No results found for: LABCA2  No results found for: CAN199  Lab Results  Component Value Date   VXY801 21.1 11/23/2019    No results found for: KPV374  Lab Results  Component Value Date   CA2729 61.2 (H) 11/23/2019    No components found for: HGQUANT  Lab Results  Component Value Date   CEA1 18.11 (H) 11/23/2019   /  CEA ([facility])  Date Value Ref Range Status  11/23/2019 18.11 (H) 0.00 - 5.00 ng/mL Final    Comment:    (NOTE) This test was performed using Architect's Chemiluminescent Microparticle Immunoassay. Values obtained from different assay methods cannot be used interchangeably. Please note that 5-10% of patients who smoke may [name] CEA levels up to 6.9 ng/mL. Performed at [facility], [place] [address] [place], [place] [address]      No results found for: AFPTUMOR  No results found for: [place]   No results found for: HGBA, HGBA2QUANT, HGBFQUANT, HGBSQUAN (Hemoglobinopathy evaluation)   Lab Results  Component Value Date   LDH 193 12/10/2013    No results  found for: IRON, TIBC, IRONPCTSAT (Iron and TIBC)  No results found for: FERRITIN  Urinalysis    Component Value Date/Time   COLORURINE YELLOW 09/17/2014 [place] 09/17/2014 0824   LABSPEC 1.015 09/17/2014 0824   PHURINE 7.5 09/17/2014 [place] 09/17/2014 0824   HGBUR NEGATIVE 09/17/2014 0824   BILIRUBINUR NEGATIVE 09/17/2014 0824   KETONESUR NEGATIVE 09/17/2014 0824   UROBILINOGEN 0.2 09/17/2014 0824   NITRITE NEGATIVE 09/17/2014 0824   LEUKOCYTESUR NEGATIVE 09/17/2014 0824    STUDIES: CT Chest W Contrast  Result Date: 11/28/2019 CLINICAL DATA:  Breast cancer staging, suspect osseous metastatic disease. EXAM: CT CHEST, ABDOMEN, AND PELVIS WITH CONTRAST TECHNIQUE: Multidetector CT imaging of the chest, abdomen and pelvis was performed following the standard protocol during bolus administration of intravenous contrast. 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 Normal heart size. No pericardial effusion. Mediastinum/Nodes: No enlarged mediastinal, hilar, or axillary lymph nodes. Benign, calcified subcarinal and right hilar lymph nodes. Thyroid gland, trachea, and esophagus demonstrate no  significant findings. Lungs/Pleura: Lungs are clear. No pleural effusion or pneumothorax. Musculoskeletal: No chest wall mass. Postoperative findings of left lumpectomy and axillary lymph node dissection. CT ABDOMEN PELVIS FINDINGS Hepatobiliary: No solid liver abnormality is seen. No gallstones, gallbladder wall thickening, or biliary dilatation. Pancreas: Unremarkable. No pancreatic ductal dilatation or surrounding inflammatory changes. Spleen: Normal in size without significant abnormality. Adrenals/Urinary Tract: Adrenal  glands are unremarkable. Kidneys are normal, without renal calculi, solid lesion, or hydronephrosis. Bladder is unremarkable. Stomach/Bowel: Stomach is within normal limits. Appendix appears normal. No evidence of bowel wall thickening, distention, or inflammatory changes. Vascular/Lymphatic: No significant vascular findings are present. No enlarged abdominal or pelvic lymph nodes. Reproductive: No mass or other abnormality. Other: No abdominal wall hernia or abnormality. No abdominopelvic ascites. Musculoskeletal: There is diffuse sclerotic osseous metastatic disease involving all visualized components of the axial skeleton. Status post bilateral hip total arthroplasty. IMPRESSION: 1. Diffuse sclerotic osseous metastatic disease involving all visualized components of the axial skeleton. 2. No other evidence of metastatic disease in the chest, abdomen or pelvis. 3. Postoperative findings of left lumpectomy and axillary lymph node dissection. Electronically Signed   By: Eddie Candle M.D.   On: 11/28/2019 09:36   NM Bone Scan Whole Body  Result Date: 11/19/2019 CLINICAL DATA:  Bone lesion. Malignancy is suspected. Breast cancer. EXAM: NUCLEAR MEDICINE WHOLE BODY BONE SCAN TECHNIQUE: Whole body anterior and posterior images were obtained approximately 3 hours after intravenous injection of radiopharmaceutical. RADIOPHARMACEUTICALS:  20.0 mCi Technetium-52mMDP IV COMPARISON:  Breast MRI 11/16/2019 FINDINGS: On posterior projection, focus of uptake at the T12 vertebral body level concern for metastatic disease. Smaller foci of uptake evidenced in the thoracic spine from T7-T11. Mottled uptake within the ribs is also suspicious. There is a focus of uptake in the inferior aspect of the sternum which is suspicious on the frontal projection. There is abnormal uptake in the L4 vertebral body on the frontal projection. 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  Electronically Signed   By: SSuzy BouchardM.D.   On: 11/19/2019 16:44   CT Abdomen Pelvis W Contrast  Result Date: 11/28/2019 CLINICAL DATA:  Breast cancer staging, suspect osseous metastatic disease. EXAM: CT CHEST, ABDOMEN, AND PELVIS WITH CONTRAST TECHNIQUE: Multidetector CT imaging of the chest, abdomen and pelvis was performed following the standard protocol during bolus administration of intravenous contrast. 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 Normal heart size. No pericardial effusion. Mediastinum/Nodes: No enlarged mediastinal, hilar, or axillary lymph nodes. Benign, calcified subcarinal and right hilar lymph nodes. Thyroid gland, trachea, and esophagus demonstrate no significant findings. Lungs/Pleura: Lungs are clear. No pleural effusion or pneumothorax. Musculoskeletal: No chest wall mass. Postoperative findings of left lumpectomy and axillary lymph node dissection. CT ABDOMEN PELVIS FINDINGS Hepatobiliary: No solid liver abnormality is seen. No gallstones, gallbladder wall thickening, or biliary dilatation. Pancreas: Unremarkable. No pancreatic ductal dilatation or surrounding inflammatory changes. Spleen: Normal in size without significant abnormality. Adrenals/Urinary Tract: Adrenal glands are unremarkable. Kidneys are normal, without renal calculi, solid lesion, or hydronephrosis. Bladder is unremarkable. Stomach/Bowel: Stomach is within normal limits. Appendix appears normal. No evidence of bowel wall thickening, distention, or inflammatory changes. Vascular/Lymphatic: No significant vascular findings are present. No enlarged abdominal or pelvic lymph nodes. Reproductive: No mass or other abnormality.  Other: No abdominal wall hernia  or abnormality. No abdominopelvic ascites. Musculoskeletal: There is diffuse sclerotic osseous metastatic disease involving all visualized components of the axial skeleton. Status post bilateral hip total arthroplasty. IMPRESSION: 1. Diffuse sclerotic osseous metastatic disease involving all visualized components of the axial skeleton. 2. No other evidence of metastatic disease in the chest, abdomen or pelvis. 3. Postoperative findings of left lumpectomy and axillary lymph node dissection. Electronically Signed   By: Eddie Candle M.D.   On: 11/28/2019 09:36   MR BREAST RIGHT W WO CONTRAST INC CAD  Result Date: 11/16/2019 CLINICAL DATA:  New, irregular 4 mm enhancing focus in the upper inner quadrant of the right breast, anterior depth, on a recent screening MRI. MR guided core needle biopsy was recommended. LABS:  None obtained on site today. EXAM: MR OF THE RIGHT BREAST WITH AND WITHOUT CONTRAST TECHNIQUE: Multiplanar, multisequence MR images of the right breast were obtained prior to and following the intravenous administration of 7 ml of Gadavist. Three-dimensional MR images were rendered by post-processing of the original MR data on an independent workstation. The three-dimensional MR images were interpreted, and findings are reported in the following complete MRI report for this study. Three dimensional images were evaluated at the independent DynaCad workstation COMPARISON:  Previous exam(s). FINDINGS: Breast composition: b. Scattered fibroglandular tissue. Background parenchymal enhancement: Minimal. Right breast: No mass or abnormal enhancement. The previously demonstrated 4 mm enhancing focus in the anterior aspect of the upper inner quadrant of the right breast was not visualized today. There was a normal appearing, tortuous vein at that location on today's images. Lymph nodes: No abnormal appearing lymph nodes. Ancillary findings: Previously noted multiple areas of enhancement in the sternum.  IMPRESSION: 1. The previously demonstrated 4 mm enhancing focus in the anterior aspect of the upper inner quadrant of the right breast was not visualized today. There is a normal appearing, tortuous vein at that location on today's images. Therefore, biopsy was not performed. 2. Previously noted multiple areas of abnormal enhancement in the sternum. RECOMMENDATION: 1. Repeat bilateral breast MRI without and with contrast in 6 months to ensure no abnormality in the upper inner quadrant of the right breast at that time. 2. Whole-body nuclear medicine bone scan scheduled on 11/19/2019. BI-RADS CATEGORY  0: Incomplete. Need additional imaging evaluation (whole-body nuclear medicine bone scan scheduled on 11/19/2019). Electronically Signed   By: Claudie Revering M.D.   On: 11/16/2019 10:48    ELIGIBLE FOR AVAILABLE RESEARCH PROTOCOL: no  ASSESSMENT: 64 y.o. [place] woman status post left lumpectomy 1996 for a T1 N1, anatomic stage II invasive lobular breast cancer, estrogen receptor positive  (a) status post adjuvant chemotherapy with doxorubicin and cyclophosphamide x4  (b) status post adjuvant radiation  (c) status post tamoxifen for 5+ years  (1) benign leukopenia: noted 2011, felt secondary to prior chemotherapy  (2) genetics testing 06/23/2018 through [facility]cer panel found no deleterious mutations in the 84 genes tested including BRCA 1-2  (a) 2 variants of uncertain significance were fund in [place] [c.1037C>T (p.Ser346Phe) and c.503C>G (p.ALA168Gly)  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 (3) staging studies:  (a) breast MRI 10/30/2019 shows sternal mottling  (b) bone scan 11/19/2019 shows multiple areas of uptake suspicious for metastatic disease  (c) CT of the chest abdomen and pelvis 11/28/2019 shows no visceral disease  (d) tumor markers 11/23/2019 shows a CEA of 18.11, CA 27-29 of 61.2, CA 125 of 21.1  (e) F-18-estradiol PET (cerianna) pending  (f) biopsy to follow  PLAN: I  reviewed the data we have so far with [place] Schooner and her husband.  Remarkably she continues to be entirely asymptomatic and has a normal functional status.  We can have a bone scan and CT scans of the chest abdomen and pelvis which show extensive bone involvement particularly in the appendicular skeleton.  This showed no liver lung or lymph node involvement.  In short there is no evidence of visceral disease.  We have a cannot that we are dealing with breast cancer since the CA 27-29 and CEA are elevated.  These elevations are not very marked but nevertheless they are consistent with what we think is the case namely that we are dealing with lobular breast cancer stage IV  We need to obtain a biopsy to confirm not only the diagnosis but also determine the phenotype.  We would prefer to do this from the visceral site.  Because lobular breast cancers do not make any Hearing, they are very difficult to image outside of bone.  Certainly we could do a bone marrow biopsy or a vertebral biopsy but decalcification alters the results and we would have very limited material to work with.  Accordingly we are going to proceed to an F-18-estradiol PET (cerianna).  This should light up any estrogen receptor positive evidence of malignancy, not only in the bones (which would confirm estrogen positivity) but also in a visceral site (which would then allow [place] to biopsy that site.).  Hopefully we will have this result within the next week or 2 and then we can proceed to biopsy.  Brittany [name] will receive her first dose of denosumab/Xgeva today.  She understands she may be pretty achy in the next 24 to 48 hours and we discussed what she should do in that case.  They are planning a flight to [facility] tomorrow and hopefully she will not have a significant discomfort there.  We also discussed what they may tell their children, which is that my expectation is her cancer will respond to treatment even though it is not curable and she would behave  more like a chronic illness allowing her to continue her good quality of life  Her genetics testing was extensive and shows no actionable germline mutations.  They will [name] me again in 4 weeks with the next Xgeva dose.  Total encounter time 45 minutes.Brittany Cruel, MD   12/13/2019 4:10 PM Medical Oncology and Hematology [facility] [place], [place] [address] Tel. [phone]    Fax. [phone]   I, Wilburn Mylar, am acting as scribe for Dr. Virgie [name]. Chinara Hertzberg.  I, Lurline Del MD, have reviewed the above documentation for accuracy and completeness, and I agree with the above.    *Total Encounter Time as defined by the Centers for Medicare and Medicaid Services includes, in addition to the face-to-face time of a patient visit (documented in the note above) non-face-to-face time: obtaining and reviewing outside history, ordering and reviewing medications, tests or procedures, care coordination (communications with other health care professionals or caregivers) and documentation in the medical record.

## 2019-12-13 ENCOUNTER — Inpatient Hospital Stay: Payer: BC Managed Care – PPO

## 2019-12-13 ENCOUNTER — Other Ambulatory Visit: Payer: Self-pay

## 2019-12-13 ENCOUNTER — Inpatient Hospital Stay: Payer: BC Managed Care – PPO | Attending: Oncology | Admitting: Oncology

## 2019-12-13 VITALS — BP 128/82 | HR 59 | Temp 98.0°F | Resp 17 | Ht 68.0 in | Wt 145.0 lb

## 2019-12-13 DIAGNOSIS — R970 Elevated carcinoembryonic antigen [CEA]: Secondary | ICD-10-CM | POA: Insufficient documentation

## 2019-12-13 DIAGNOSIS — Z170 Estrogen receptor positive status [ER+]: Secondary | ICD-10-CM

## 2019-12-13 DIAGNOSIS — C50112 Malignant neoplasm of central portion of left female breast: Secondary | ICD-10-CM | POA: Diagnosis not present

## 2019-12-13 DIAGNOSIS — R978 Other abnormal tumor markers: Secondary | ICD-10-CM | POA: Diagnosis not present

## 2019-12-13 DIAGNOSIS — M4120 Other idiopathic scoliosis, site unspecified: Secondary | ICD-10-CM | POA: Diagnosis not present

## 2019-12-13 DIAGNOSIS — C7951 Secondary malignant neoplasm of bone: Secondary | ICD-10-CM | POA: Diagnosis not present

## 2019-12-13 DIAGNOSIS — Q6589 Other specified congenital deformities of hip: Secondary | ICD-10-CM

## 2019-12-13 DIAGNOSIS — Z853 Personal history of malignant neoplasm of breast: Secondary | ICD-10-CM | POA: Insufficient documentation

## 2019-12-13 DIAGNOSIS — H40039 Anatomical narrow angle, unspecified eye: Secondary | ICD-10-CM

## 2019-12-13 DIAGNOSIS — M160 Bilateral primary osteoarthritis of hip: Secondary | ICD-10-CM

## 2019-12-13 DIAGNOSIS — I7300 Raynaud's syndrome without gangrene: Secondary | ICD-10-CM

## 2019-12-13 DIAGNOSIS — Z96643 Presence of artificial hip joint, bilateral: Secondary | ICD-10-CM

## 2019-12-13 LAB — CBC WITH DIFFERENTIAL/PLATELET
Abs Immature Granulocytes: 0.02 10*3/uL (ref 0.00–0.07)
Basophils Absolute: 0.1 10*3/uL (ref 0.0–0.1)
Basophils Relative: 1 %
Eosinophils Absolute: 0.1 10*3/uL (ref 0.0–0.5)
Eosinophils Relative: 3 %
HCT: 37.9 % (ref 36.0–46.0)
Hemoglobin: 12.8 g/dL (ref 12.0–15.0)
Immature Granulocytes: 0 %
Lymphocytes Relative: 40 %
Lymphs Abs: 2 10*3/uL (ref 0.7–4.0)
MCH: 32.5 pg (ref 26.0–34.0)
MCHC: 33.8 g/dL (ref 30.0–36.0)
MCV: 96.2 fL (ref 80.0–100.0)
Monocytes Absolute: 0.3 10*3/uL (ref 0.1–1.0)
Monocytes Relative: 6 %
Neutro Abs: 2.5 10*3/uL (ref 1.7–7.7)
Neutrophils Relative %: 50 %
Platelets: 211 10*3/uL (ref 150–400)
RBC: 3.94 MIL/uL (ref 3.87–5.11)
RDW: 13.2 % (ref 11.5–15.5)
WBC: 5 10*3/uL (ref 4.0–10.5)
nRBC: 0 % (ref 0.0–0.2)

## 2019-12-13 LAB — COMPREHENSIVE METABOLIC PANEL
ALT: 48 U/L — ABNORMAL HIGH (ref 0–44)
AST: 49 U/L — ABNORMAL HIGH (ref 15–41)
Albumin: 4 g/dL (ref 3.5–5.0)
Alkaline Phosphatase: 115 U/L (ref 38–126)
Anion gap: 8 (ref 5–15)
BUN: 15 mg/dL (ref 8–23)
CO2: 27 mmol/L (ref 22–32)
Calcium: 8.9 mg/dL (ref 8.9–10.3)
Chloride: 102 mmol/L (ref 98–111)
Creatinine, Ser: 0.8 mg/dL (ref 0.44–1.00)
GFR calc Af Amer: >60 mL/min (ref >60)
GFR calc non Af Amer: >60 mL/min (ref >60)
Glucose, Bld: 94 mg/dL (ref 70–99)
Potassium: 4 mmol/L (ref 3.5–5.1)
Sodium: 137 mmol/L (ref 135–145)
Total Bilirubin: 0.3 mg/dL (ref 0.3–1.2)
Total Protein: 7.7 g/dL (ref 6.5–8.1)

## 2019-12-13 MED ORDER — DENOSUMAB 120 MG/1.7ML ~~LOC~~ SOLN
120.0000 mg | Freq: Once | SUBCUTANEOUS | Status: AC
  Administered 2019-12-13: 120 mg via SUBCUTANEOUS

## 2019-12-13 MED ORDER — DENOSUMAB 120 MG/1.7ML ~~LOC~~ SOLN
SUBCUTANEOUS | Status: AC
  Filled 2019-12-13: qty 1.7

## 2019-12-13 NOTE — Patient Instructions (Signed)
Denosumab injection What is this medicine? DENOSUMAB (den oh sue mab) slows bone breakdown. Prolia is used to treat osteoporosis in women after menopause and in men, and in people who are taking corticosteroids for 6 months or more. Xgeva is used to treat a high calcium level due to cancer and to prevent bone fractures and other bone problems caused by multiple myeloma or cancer bone metastases. Xgeva is also used to treat giant cell tumor of the bone. This medicine may be used for other purposes; ask your health care provider or pharmacist if you have questions. COMMON BRAND NAME(S): Prolia, XGEVA What should I tell my health care provider before I take this medicine? They need to know if you have any of these conditions:  dental disease  having surgery or tooth extraction  infection  kidney disease  low levels of calcium or Vitamin D in the blood  malnutrition  on hemodialysis  skin conditions or sensitivity  thyroid or parathyroid disease  an unusual reaction to denosumab, other medicines, foods, dyes, or preservatives  pregnant or trying to get pregnant  breast-feeding How should I use this medicine? This medicine is for injection under the skin. It is given by a health care professional in a hospital or clinic setting. A special MedGuide will be given to you before each treatment. Be sure to read this information carefully each time. For Prolia, talk to your pediatrician regarding the use of this medicine in children. Special care may be needed. For Xgeva, talk to your pediatrician regarding the use of this medicine in children. While this drug may be prescribed for children as young as 13 years for selected conditions, precautions do apply. Overdosage: If you think you have taken too much of this medicine contact a poison control center or emergency room at once. NOTE: This medicine is only for you. Do not share this medicine with others. What if I miss a dose? It is  important not to miss your dose. Call your doctor or health care professional if you are unable to keep an appointment. What may interact with this medicine? Do not take this medicine with any of the following medications:  other medicines containing denosumab This medicine may also interact with the following medications:  medicines that lower your chance of fighting infection  steroid medicines like prednisone or cortisone This list may not describe all possible interactions. Give your health care provider a list of all the medicines, herbs, non-prescription drugs, or dietary supplements you use. Also tell them if you smoke, drink alcohol, or use illegal drugs. Some items may interact with your medicine. What should I watch for while using this medicine? Visit your doctor or health care professional for regular checks on your progress. Your doctor or health care professional may order blood tests and other tests to see how you are doing. Call your doctor or health care professional for advice if you get a fever, chills or sore throat, or other symptoms of a cold or flu. Do not treat yourself. This drug may decrease your body's ability to fight infection. Try to avoid being around people who are sick. You should make sure you get enough calcium and vitamin D while you are taking this medicine, unless your doctor tells you not to. Discuss the foods you eat and the vitamins you take with your health care professional. See your dentist regularly. Brush and floss your teeth as directed. Before you have any dental work done, tell your dentist you are   receiving this medicine. Do not become pregnant while taking this medicine or for 5 months after stopping it. Talk with your doctor or health care professional about your birth control options while taking this medicine. Women should inform their doctor if they wish to become pregnant or think they might be pregnant. There is a potential for serious side  effects to an unborn child. Talk to your health care professional or pharmacist for more information. What side effects may I notice from receiving this medicine? Side effects that you should report to your doctor or health care professional as soon as possible:  allergic reactions like skin rash, itching or hives, swelling of the face, lips, or tongue  bone pain  breathing problems  dizziness  jaw pain, especially after dental work  redness, blistering, peeling of the skin  signs and symptoms of infection like fever or chills; cough; sore throat; pain or trouble passing urine  signs of low calcium like fast heartbeat, muscle cramps or muscle pain; pain, tingling, numbness in the hands or feet; seizures  unusual bleeding or bruising  unusually weak or tired Side effects that usually do not require medical attention (report to your doctor or health care professional if they continue or are bothersome):  constipation  diarrhea  headache  joint pain  loss of appetite  muscle pain  runny nose  tiredness  upset stomach This list may not describe all possible side effects. Call your doctor for medical advice about side effects. You may report side effects to FDA at 1-800-FDA-1088. Where should I keep my medicine? This medicine is only given in a clinic, doctor's office, or other health care setting and will not be stored at home. NOTE: This sheet is a summary. It may not cover all possible information. If you have questions about this medicine, talk to your doctor, pharmacist, or health care provider.  2020 Elsevier/Gold Standard (2018-02-17 16:10:44)

## 2019-12-14 ENCOUNTER — Telehealth: Payer: Self-pay | Admitting: Oncology

## 2019-12-14 LAB — CANCER ANTIGEN 27.29: CA 27.29: 64.1 U/mL — ABNORMAL HIGH (ref 0.0–38.6)

## 2019-12-14 NOTE — Telephone Encounter (Signed)
I left a message regarding schedule  

## 2019-12-21 ENCOUNTER — Encounter: Payer: Self-pay | Admitting: Internal Medicine

## 2020-01-10 NOTE — Progress Notes (Signed)
Brittany [name]  [phone] Fax:([phone]     ID: Brittany [name] DOB: April 23, 1956  MR#: [id number]  FMB#:[id number]  Patient Care Team: Binnie Rail, MD as PCP - General (Internal Medicine) Aloha Gell, MD as Consulting Physician (Obstetrics and Gynecology) Sydnee Levans, MD (Dermatology) , Virgie Dad, MD as Consulting Physician (Oncology) Richmond Campbell, MD as Consulting Physician (Gastroenterology) Stefanie Libel, MD as Consulting Physician (Sports Medicine) Chauncey Cruel, MD OTHER MD:  CHIEF COMPLAINT: Stage IV lobular breast cancer  CURRENT TREATMENT: anastrozole; Delton See   INTERVAL HISTORY: Brittany [name] returns today for follow up of her stage IV lobular breast cancer.   She continues on anastrozole.  She has had no hot flashes or vaginal dryness problems on this medication  She began on Xgeva at her last visit on 12/13/2019.  She tolerated that well.  She has pain in an area in the right rib cage, which particularly occurs when she stands up from a sitting or lying position.  Her most recent bone density on 11/20/2018 showed a T score of -1.0 (normal).  She is scheduled for [place] PET scan later today.   REVIEW OF SYSTEMS: Brittany [name] and her husband travel to [place] to visit their daughter and granddaughter (who will be 43 years old next month).  Both of them have had their Pfizer vaccine 2 shots and did well with that.  Brittany [name] continues to live a normal life.  She works, she gets on her exercise bike daily, does Pilates once a week tennis once a week golf once a week and generally has no restrictions.  She does take some Advil for the right rib cage area pain and that is working moderately well.  Detailed review of systems was otherwise noncontributory   HISTORY OF CURRENT ILLNESS: From the original intake note:  "Brittany" [name] has a history of left breast cancer dating back to 26.  She was followed by Dr. Beryle Beams and underwent  lumpectomy, adjuvant chemotherapy, radiation and "hormonal therapy".  Per Brittany's recollection the tumor was less than 2 cm, 2 out of 10 lymph nodes were involved, and it was a lobular breast cancer.  She took tamoxifen for many years and then raloxifene.  Dr. Beryle Beams also evaluated her for leukopenia which was noted at first in 2011, worked up with a negative ANA and negative rheumatoid factors.  He felt it was a benign residual from her earlier chemotherapy.  She was released from follow-up in 2015.  She has continued on intensified screening because of her family history.  On November 23, 2017 she had bilateral breast MRIs which were unremarkable.  Mammography November 20, 2018 showed no findings suspicious of malignancy.  Breast MRI 10/30/2019 showed a new irregular 0.4 cm enhancing focus in the upper inner quadrant of the right breast.  The left breast continued to be unremarkable except for postoperative changes and there were no abnormal appearing lymph nodes.  However in that same MRI multiple areas of enhancement were noted in the sternum.  This had not been seen in the prior MRI.  Plan right MRI biopsy 11/16/2019 was canceled as the previously demonstrated 0.4 cm focus in the right breast showed only a tortuous vein at that location.  However bone scan 11/19/2019 obtained to follow-up on the sternal findings showed additional areas of concern at T12, T7-T11, L4 and ribs, as well as the sternum.  The patient's subsequent history is as detailed below.   PAST MEDICAL HISTORY: Past Medical History:  Diagnosis  Date  . ANEMIA-NOS   . Asymptomatic varicose veins   . Breast cancer ([place])   . BREAST CANCER, HX OF 01/1995 dx   s/p Lumpectomy, XRT, chemo and 4yrarmidex  . Gestational diabetes mellitus in childbirth, diet controlled 1985  . Hypothyroid 09/28/2015   Dx 09/2015  . Leukopenia    mild since chemo  . OSTEOARTHRITIS   . Personal history of chemotherapy   . Personal history of  radiation therapy   . SCOLIOSIS, MILD     PAST SURGICAL HISTORY: Past Surgical History:  Procedure Laterality Date  . BREAST BIOPSY    . BREAST LUMPECTOMY Left    1996  . left lumpectomy  1996  . TONSILLECTOMY    . TOTAL HIP ARTHROPLASTY     2002 & 2003-Dr. vail @ [facility]    FAMILY HISTORY Family History  Problem Relation Age of Onset  . Heart disease Father 54      AMI  . Hyperlipidemia Father   . Valvular heart disease Father 766 . Parkinson's disease Father 742 . Arthritis Mother   . Breast cancer Mother 533 . Hyperlipidemia Mother   . Osteoporosis Mother   . Pancreatic cancer Maternal Grandmother 73       presumed dx  . Diabetes Paternal Grandfather        type 2  . Breast cancer Maternal Aunt   The patient's father died at the age of 829from heart disease.  He had Lewy body dementia.  The patient's mother is 826years old as of January 2021.  She is in a memory unit.  She has a history of breast cancer diagnosed in her 543s(ductal carcinoma in situ).  The patient's mother sister had breast cancer diagnosed around age 66  The patient's mother's mother also had cancer, "internal" (possibly ovarian or gastrointestinal).  There is no cancer on the father's side of the family.   GYNECOLOGIC HISTORY:  No LMP recorded. Patient is postmenopausal. Menarche: 64years old Age [place] first live birth: 64[place] with chemotherapy, in 1996 Contraceptive a little over a year, with no complications HRT no  Hysterectomy?  No Salpingo-oophorectomy?  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  She and her husband Brittany Gullingmet while working for [employer]  [place] exposed to some radiation at that time.  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  They are thinking about  retiring later this year.  Their daughter Brittany [name] a hPharmacist, hospitalin [place]  Their son Brittany Reichmannworks for the same wRyland Groupthat the patient works for.  Note that Brittany Reichmanndid the [employer]  The patient has 1 grandchild (in [place].  The patient [place] [employer] [place] The patient's husband is her healthcare power of attorney   HEALTH MAINTENANCE: Social History   Tobacco Use  . Smoking status: Former Smoker    Quit date: 06/25/1977    Years since quitting: 42.5  . Smokeless tobacco: Never Used  Substance Use Topics  . Alcohol use: Yes    Alcohol/week: 3.0 - 4.0 standard drinks    Types: 3 - 4 Glasses of wine per week  . Drug use: No  Colonoscopy: 2015  PAP: Up-to-date  Bone density: 11/20/2018, normal   No Known Allergies  Current Outpatient Medications  Medication Sig Dispense Refill  . anastrozole (ARIMIDEX) 1 MG tablet Take 1 tablet (1 mg total) by mouth daily. 90 tablet 4  . Calcium Citrate 250 MG TABS Take 4 tablets (1,000 mg total) by mouth daily.  0  . Cholecalciferol (VITAMIN D3) 1000 UNITS CAPS Take by mouth daily.      . diclofenac sodium (VOLTAREN) 1 % GEL Apply 2 g topically 3 (three) times daily as needed. 100 g 1  . levothyroxine (SYNTHROID) 50 MCG tablet TAKE 1 TABLET BY MOUTH EVERY DAY 90 tablet 3  . Multiple Vitamin (MULTIVITAMIN) tablet Take 1 tablet by mouth daily.      . Omega-3 Fatty Acids (FISH OIL) 1000 MG CPDR Take by mouth daily.       No current facility-administered medications for this visit.    OBJECTIVE:  white woman who appears younger than stated age  66:   01/11/20 0856  BP: 117/78  Pulse: 74  Resp: 18  Temp: 98.1 F (36.7 C)  SpO2: 100%     Body mass index is 22.2 kg/m.   Wt Readings from Last 3 Encounters:  01/11/20 146 lb (66.2 kg)  12/13/19 145 lb (65.8 kg)  11/22/19 149 lb 8 oz (67.8 kg)      ECOG FS:1 - Symptomatic but completely ambulatory  Sclerae unicteric,  EOMs intact Wearing a mask No cervical or supraclavicular adenopathy Lungs no rales or rhonchi Heart regular rate and rhythm Abd soft, nontender, positive bowel sounds MSK no focal spinal tenderness, no upper extremity lymphedema Neuro: nonfocal, well oriented, appropriate affect Breasts: The right breast is unremarkable.  The left breast is status post remote lumpectomy and radiation.  There is the expected dimple associated with scar, which is minimally tender to palpation, all of this being normal posttreatment.  There is no erythema or swelling.  There is no evidence of local recurrence.  Both axillae are benign.   LAB RESULTS:  CMP     Component Value Date/Time   NA 137 12/13/2019 1500   NA 139 12/10/2013 0841   K 4.0 12/13/2019 1500   K 4.5 12/10/2013 0841   CL 102 12/13/2019 1500   CL 102 12/01/2012 0955   CO2 27 12/13/2019 1500   CO2 28 12/10/2013 0841   GLUCOSE 94 12/13/2019 1500   GLUCOSE 81 12/10/2013 0841   GLUCOSE 88 12/01/2012 0955   BUN 15 12/13/2019 1500   BUN 14 08/04/2015 0000   BUN 17.5 12/10/2013 0841   CREATININE 0.80 12/13/2019 1500   CREATININE 0.9 12/10/2013 0841   CALCIUM 8.9 12/13/2019 1500   CALCIUM 9.6 12/10/2013 0841   PROT 7.7 12/13/2019 1500   PROT 6.7 12/10/2013 0841   ALBUMIN 4.0 12/13/2019 1500   ALBUMIN 3.9 12/10/2013 0841   AST 49 (H) 12/13/2019 1500   AST 22 12/10/2013 0841   ALT 48 (H) 12/13/2019 1500   ALT 27 12/10/2013 0841   ALKPHOS 115 12/13/2019 1500   ALKPHOS 54 12/10/2013 0841   BILITOT 0.3 12/13/2019 1500   BILITOT 0.41 12/10/2013 0841   GFRNONAA >60 12/13/2019 1500   GFRAA >60 12/13/2019 1500    No results found for: TOTALPROTELP, ALBUMINELP, A1GS, A2GS, BETS, BETA2SER, GAMS, MSPIKE, SPEI  No results found for: KPAFRELGTCHN, LAMBDASER, KAPLAMBRATIO  Lab Results  Component Value Date   WBC 6.1 01/11/2020   NEUTROABS 4.2 01/11/2020   HGB 12.4  01/11/2020   HCT 36.9 01/11/2020   MCV 95.8 01/11/2020   PLT 161  01/11/2020      Chemistry      Component Value Date/Time   NA 137 12/13/2019 1500   NA 139 12/10/2013 0841   K 4.0 12/13/2019 1500   K 4.5 12/10/2013 0841   CL 102 12/13/2019 1500   CL 102 12/01/2012 0955   CO2 27 12/13/2019 1500   CO2 28 12/10/2013 0841   BUN 15 12/13/2019 1500   BUN 14 08/04/2015 0000   BUN 17.5 12/10/2013 0841   CREATININE 0.80 12/13/2019 1500   CREATININE 0.9 12/10/2013 0841   GLU 102 08/04/2015 0000      Component Value Date/Time   CALCIUM 8.9 12/13/2019 1500   CALCIUM 9.6 12/10/2013 0841   ALKPHOS 115 12/13/2019 1500   ALKPHOS 54 12/10/2013 0841   AST 49 (H) 12/13/2019 1500   AST 22 12/10/2013 0841   ALT 48 (H) 12/13/2019 1500   ALT 27 12/10/2013 0841   BILITOT 0.3 12/13/2019 1500   BILITOT 0.41 12/10/2013 0841      No results found for: LABCA2  No components found for: [id number]  No results for input(s): INR in the last 168 hours.  No results found for: LABCA2  No results found for: CAN199  Lab Results  Component Value Date   ALP379 21.1 11/23/2019    No results found for: KWI097  Lab Results  Component Value Date   CA2729 64.1 (H) 12/13/2019    No components found for: HGQUANT  Lab Results  Component Value Date   CEA1 18.11 (H) 11/23/2019   /  CEA ([facility])  Date Value Ref Range Status  11/23/2019 18.11 (H) 0.00 - 5.00 ng/mL Final    Comment:    (NOTE) This test was performed using Architect's Chemiluminescent Microparticle Immunoassay. Values obtained from different assay methods cannot be used interchangeably. Please note that 5-10% of patients who smoke may see CEA levels up to 6.9 ng/mL. Performed at [facility], [place] [address], [place], [place] [address]      No results found for: AFPTUMOR  No results found for: CHROMOGRNA   No results found for: HGBA, HGBA2QUANT, HGBFQUANT, HGBSQUAN (Hemoglobinopathy evaluation)   Lab Results  Component Value Date   LDH 193 12/10/2013      No results found for: IRON, TIBC, IRONPCTSAT (Iron and TIBC)  No results found for: FERRITIN  Urinalysis    Component Value Date/Time   COLORURINE YELLOW 09/17/2014 [place] 09/17/2014 0824   LABSPEC 1.015 09/17/2014 0824   PHURINE 7.5 09/17/2014 [place] 09/17/2014 [place] 09/17/2014 [place] 09/17/2014 0824   KETONESUR NEGATIVE 09/17/2014 0824   UROBILINOGEN 0.2 09/17/2014 0824   NITRITE NEGATIVE 09/17/2014 0824   LEUKOCYTESUR NEGATIVE 09/17/2014 0824    STUDIES: No results found.   ELIGIBLE FOR AVAILABLE RESEARCH PROTOCOL: no  ASSESSMENT: 64 y.o. [place] woman status post left lumpectomy 1996 for a T1 N1, anatomic stage II invasive lobular breast cancer, estrogen receptor positive  (a) status post adjuvant chemotherapy with doxorubicin and cyclophosphamide x4  (b) status post adjuvant radiation  (c) status post tamoxifen for 5+ years  (1) benign leukopenia: noted 2011, felt secondary to prior chemotherapy  (2) genetics testing 06/23/2018 through [facility] panel found no deleterious mutations in the 84 genes tested including BRCA 1-2  (a) 2 variants of uncertain significance were fund in  MSH6 [c.1037C>T (p.Ser346Phe) and c.503C>G (p.ALA168Gly)  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 (3) staging studies:  (a) breast MRI 10/30/2019 shows sternal mottling  (b) bone scan 11/19/2019 shows multiple areas of uptake suspicious for metastatic disease  (c) CT of the chest abdomen and pelvis 11/28/2019 shows no visceral disease  (d) tumor markers 11/23/2019 shows a CEA of 18.11, CA 27-29 of 61.2, CA 125 of 21.1  (e) F-18-estradiol PET (cerianna) 01/11/2020  (f) biopsy to follow  (4) denosumab/Xgeva started 12/13/2019   PLAN: Alexandrea is tolerating her treatment well and the plan at least for now is to continue anastrozole and Xgeva.  She will receive her second Xgeva dose today and we will  continue that every 4 weeks at least for the initial year.  She will have her F-18 estradiol PET scan today.  I am hopeful that that will give [place] a better site to biopsy then bones, but if it does not then we will do a bone biopsy to confirm what we are dealing with and assess receptors.  I have alerted her that the results of this area and it may be scary.  I am not planning to change treatment however but will treat her as I normally treat my stage IV lobular's.  The difference is that we can get a better biopsy and we will have more objective follow-up assuming the F-18 estradiol PET lives up to expectations.  She will see me again in 4 weeks for her third dose of Xgeva.  By then we should have had a biopsy and results and we can discuss this in more detail  Total encounter time 30 minutes.Chauncey Cruel, MD   01/11/2020 9:27 AM Medical Oncology and Hematology [facility] [place], [place] [address] Tel. [phone]    Fax. [phone]   I, Wilburn Mylar, am acting as scribe for Dr. Virgie Dad. .  I, Lurline Del MD, have reviewed the above documentation for accuracy and completeness, and I agree with the above.   *Total Encounter Time as defined by the Centers for Medicare and Medicaid Services includes, in addition to the face-to-face time of a patient visit (documented in the note above) non-face-to-face time: obtaining and reviewing outside history, ordering and reviewing medications, tests or procedures, care coordination (communications with other health care professionals or caregivers) and documentation in the medical record.

## 2020-01-11 ENCOUNTER — Ambulatory Visit (HOSPITAL_COMMUNITY)
Admission: RE | Admit: 2020-01-11 | Discharge: 2020-01-11 | Disposition: A | Discharge status: Home / Self Care | Payer: BC Managed Care – PPO | Source: Ambulatory Visit | Attending: Oncology | Admitting: Oncology

## 2020-01-11 ENCOUNTER — Inpatient Hospital Stay: Payer: BC Managed Care – PPO | Attending: Oncology | Admitting: Oncology

## 2020-01-11 ENCOUNTER — Inpatient Hospital Stay: Payer: BC Managed Care – PPO

## 2020-01-11 ENCOUNTER — Other Ambulatory Visit: Payer: Self-pay

## 2020-01-11 VITALS — BP 117/78 | HR 74 | Temp 98.1°F | Resp 18 | Ht 68.0 in | Wt 146.0 lb

## 2020-01-11 DIAGNOSIS — C50112 Malignant neoplasm of central portion of left female breast: Secondary | ICD-10-CM

## 2020-01-11 DIAGNOSIS — M4120 Other idiopathic scoliosis, site unspecified: Secondary | ICD-10-CM | POA: Diagnosis not present

## 2020-01-11 DIAGNOSIS — Z853 Personal history of malignant neoplasm of breast: Secondary | ICD-10-CM | POA: Diagnosis present

## 2020-01-11 DIAGNOSIS — Q6589 Other specified congenital deformities of hip: Secondary | ICD-10-CM

## 2020-01-11 DIAGNOSIS — I7300 Raynaud's syndrome without gangrene: Secondary | ICD-10-CM | POA: Diagnosis not present

## 2020-01-11 DIAGNOSIS — Z170 Estrogen receptor positive status [ER+]: Secondary | ICD-10-CM | POA: Insufficient documentation

## 2020-01-11 DIAGNOSIS — C7951 Secondary malignant neoplasm of bone: Secondary | ICD-10-CM

## 2020-01-11 DIAGNOSIS — H40039 Anatomical narrow angle, unspecified eye: Secondary | ICD-10-CM

## 2020-01-11 DIAGNOSIS — Z96643 Presence of artificial hip joint, bilateral: Secondary | ICD-10-CM

## 2020-01-11 DIAGNOSIS — M160 Bilateral primary osteoarthritis of hip: Secondary | ICD-10-CM

## 2020-01-11 LAB — CBC WITH DIFFERENTIAL/PLATELET
Abs Immature Granulocytes: 0.02 10*3/uL (ref 0.00–0.07)
Basophils Absolute: 0 10*3/uL (ref 0.0–0.1)
Basophils Relative: 1 %
Eosinophils Absolute: 0.1 10*3/uL (ref 0.0–0.5)
Eosinophils Relative: 1 %
HCT: 36.9 % (ref 36.0–46.0)
Hemoglobin: 12.4 g/dL (ref 12.0–15.0)
Immature Granulocytes: 0 %
Lymphocytes Relative: 21 %
Lymphs Abs: 1.2 10*3/uL (ref 0.7–4.0)
MCH: 32.2 pg (ref 26.0–34.0)
MCHC: 33.6 g/dL (ref 30.0–36.0)
MCV: 95.8 fL (ref 80.0–100.0)
Monocytes Absolute: 0.5 10*3/uL (ref 0.1–1.0)
Monocytes Relative: 8 %
Neutro Abs: 4.2 10*3/uL (ref 1.7–7.7)
Neutrophils Relative %: 69 %
Platelets: 161 10*3/uL (ref 150–400)
RBC: 3.85 MIL/uL — ABNORMAL LOW (ref 3.87–5.11)
RDW: 13.4 % (ref 11.5–15.5)
WBC: 6.1 10*3/uL (ref 4.0–10.5)
nRBC: 0 % (ref 0.0–0.2)

## 2020-01-11 LAB — COMPREHENSIVE METABOLIC PANEL
ALT: 49 U/L — ABNORMAL HIGH (ref 0–44)
AST: 54 U/L — ABNORMAL HIGH (ref 15–41)
Albumin: 3.5 g/dL (ref 3.5–5.0)
Alkaline Phosphatase: 108 U/L (ref 38–126)
Anion gap: 7 (ref 5–15)
BUN: 10 mg/dL (ref 8–23)
CO2: 24 mmol/L (ref 22–32)
Calcium: 8.4 mg/dL — ABNORMAL LOW (ref 8.9–10.3)
Chloride: 102 mmol/L (ref 98–111)
Creatinine, Ser: 0.83 mg/dL (ref 0.44–1.00)
GFR calc Af Amer: >60 mL/min (ref >60)
GFR calc non Af Amer: >60 mL/min (ref >60)
Glucose, Bld: 80 mg/dL (ref 70–99)
Potassium: 4.3 mmol/L (ref 3.5–5.1)
Sodium: 133 mmol/L — ABNORMAL LOW (ref 135–145)
Total Bilirubin: 0.3 mg/dL (ref 0.3–1.2)
Total Protein: 6.9 g/dL (ref 6.5–8.1)

## 2020-01-11 MED ORDER — DENOSUMAB 120 MG/1.7ML ~~LOC~~ SOLN
SUBCUTANEOUS | Status: AC
  Filled 2020-01-11: qty 1.7

## 2020-01-11 MED ORDER — DENOSUMAB 120 MG/1.7ML ~~LOC~~ SOLN
120.0000 mg | Freq: Once | SUBCUTANEOUS | Status: AC
  Administered 2020-01-11: 120 mg via SUBCUTANEOUS

## 2020-01-11 MED ORDER — FLUOROESTRADIOL F 18 4-100 MCI/ML IV SOLN
5.7000 | Freq: Once | INTRAVENOUS | Status: AC
  Administered 2020-01-11: 5.7 via INTRAVENOUS

## 2020-01-11 NOTE — Patient Instructions (Signed)
Denosumab injection What is this medicine? DENOSUMAB (den oh sue mab) slows bone breakdown. Prolia is used to treat osteoporosis in women after menopause and in men, and in people who are taking corticosteroids for 6 months or more. Xgeva is used to treat a high calcium level due to cancer and to prevent bone fractures and other bone problems caused by multiple myeloma or cancer bone metastases. Xgeva is also used to treat giant cell tumor of the bone. This medicine may be used for other purposes; ask your health care provider or pharmacist if you have questions. COMMON BRAND NAME(S): Prolia, XGEVA What should I tell my health care provider before I take this medicine? They need to know if you have any of these conditions:  dental disease  having surgery or tooth extraction  infection  kidney disease  low levels of calcium or Vitamin D in the blood  malnutrition  on hemodialysis  skin conditions or sensitivity  thyroid or parathyroid disease  an unusual reaction to denosumab, other medicines, foods, dyes, or preservatives  pregnant or trying to get pregnant  breast-feeding How should I use this medicine? This medicine is for injection under the skin. It is given by a health care professional in a hospital or clinic setting. A special MedGuide will be given to you before each treatment. Be sure to read this information carefully each time. For Prolia, talk to your pediatrician regarding the use of this medicine in children. Special care may be needed. For Xgeva, talk to your pediatrician regarding the use of this medicine in children. While this drug may be prescribed for children as young as 13 years for selected conditions, precautions do apply. Overdosage: If you think you have taken too much of this medicine contact a poison control center or emergency room at once. NOTE: This medicine is only for you. Do not share this medicine with others. What if I miss a dose? It is  important not to miss your dose. Call your doctor or health care professional if you are unable to keep an appointment. What may interact with this medicine? Do not take this medicine with any of the following medications:  other medicines containing denosumab This medicine may also interact with the following medications:  medicines that lower your chance of fighting infection  steroid medicines like prednisone or cortisone This list may not describe all possible interactions. Give your health care provider a list of all the medicines, herbs, non-prescription drugs, or dietary supplements you use. Also tell them if you smoke, drink alcohol, or use illegal drugs. Some items may interact with your medicine. What should I watch for while using this medicine? Visit your doctor or health care professional for regular checks on your progress. Your doctor or health care professional may order blood tests and other tests to see how you are doing. Call your doctor or health care professional for advice if you get a fever, chills or sore throat, or other symptoms of a cold or flu. Do not treat yourself. This drug may decrease your body's ability to fight infection. Try to avoid being around people who are sick. You should make sure you get enough calcium and vitamin D while you are taking this medicine, unless your doctor tells you not to. Discuss the foods you eat and the vitamins you take with your health care professional. See your dentist regularly. Brush and floss your teeth as directed. Before you have any dental work done, tell your dentist you are   receiving this medicine. Do not become pregnant while taking this medicine or for 5 months after stopping it. Talk with your doctor or health care professional about your birth control options while taking this medicine. Women should inform their doctor if they wish to become pregnant or think they might be pregnant. There is a potential for serious side  effects to an unborn child. Talk to your health care professional or pharmacist for more information. What side effects may I notice from receiving this medicine? Side effects that you should report to your doctor or health care professional as soon as possible:  allergic reactions like skin rash, itching or hives, swelling of the face, lips, or tongue  bone pain  breathing problems  dizziness  jaw pain, especially after dental work  redness, blistering, peeling of the skin  signs and symptoms of infection like fever or chills; cough; sore throat; pain or trouble passing urine  signs of low calcium like fast heartbeat, muscle cramps or muscle pain; pain, tingling, numbness in the hands or feet; seizures  unusual bleeding or bruising  unusually weak or tired Side effects that usually do not require medical attention (report to your doctor or health care professional if they continue or are bothersome):  constipation  diarrhea  headache  joint pain  loss of appetite  muscle pain  runny nose  tiredness  upset stomach This list may not describe all possible side effects. Call your doctor for medical advice about side effects. You may report side effects to FDA at 1-800-FDA-1088. Where should I keep my medicine? This medicine is only given in a clinic, doctor's office, or other health care setting and will not be stored at home. NOTE: This sheet is a summary. It may not cover all possible information. If you have questions about this medicine, talk to your doctor, pharmacist, or health care provider.  2020 Elsevier/Gold Standard (2018-02-17 16:10:44)

## 2020-01-11 NOTE — Progress Notes (Signed)
Patent here today receiving xgeva 120 calcium is 8.4 today Dr. Jana Hakim ok with giving xgeva today

## 2020-01-14 ENCOUNTER — Encounter: Payer: Self-pay | Admitting: Oncology

## 2020-01-14 ENCOUNTER — Other Ambulatory Visit: Payer: Self-pay | Admitting: Oncology

## 2020-01-14 ENCOUNTER — Telehealth: Payer: Self-pay | Admitting: Oncology

## 2020-01-14 NOTE — Telephone Encounter (Signed)
Scheduled appts per 3/19 los. Left a voicemail with appt details.

## 2020-01-15 ENCOUNTER — Telehealth: Payer: Self-pay

## 2020-01-15 ENCOUNTER — Other Ambulatory Visit: Payer: Self-pay | Admitting: Oncology

## 2020-01-15 NOTE — Telephone Encounter (Signed)
Left voicemail for patient to call back [facility] and ask for Dalani Mette. Will try call again later.

## 2020-01-15 NOTE — Telephone Encounter (Signed)
Left another voicemail for patient to call back [facility]

## 2020-01-17 ENCOUNTER — Telehealth: Payer: Self-pay

## 2020-01-17 NOTE — Telephone Encounter (Signed)
Per Wilber Bihari NP called flow [phone]) and scheduled patient for Bone Marrow on Wednesday 01/23/20 @730a . Patient made aware of appiontment date, time, and location.

## 2020-01-17 NOTE — Telephone Encounter (Signed)
Left voicemail for husband Jeneen Rinks [phone]) to please call back [facility] [phone]

## 2020-01-23 ENCOUNTER — Telehealth: Payer: Self-pay | Admitting: Adult Health

## 2020-01-23 NOTE — Telephone Encounter (Signed)
Patient called about marrow appointment.  Scheduled for 01/25/2020.  Schedule message sent, and flow cytometry called.    Wilber Bihari, NP

## 2020-01-25 ENCOUNTER — Encounter: Payer: Self-pay | Admitting: Oncology

## 2020-01-25 ENCOUNTER — Inpatient Hospital Stay: Payer: BC Managed Care – PPO | Attending: Oncology | Admitting: Oncology

## 2020-01-25 ENCOUNTER — Other Ambulatory Visit: Payer: Self-pay

## 2020-01-25 ENCOUNTER — Inpatient Hospital Stay: Payer: BC Managed Care – PPO

## 2020-01-25 VITALS — BP 120/78 | HR 66 | Temp 98.4°F | Resp 16

## 2020-01-25 DIAGNOSIS — C7951 Secondary malignant neoplasm of bone: Secondary | ICD-10-CM

## 2020-01-25 DIAGNOSIS — Z170 Estrogen receptor positive status [ER+]: Secondary | ICD-10-CM | POA: Diagnosis not present

## 2020-01-25 DIAGNOSIS — C50112 Malignant neoplasm of central portion of left female breast: Secondary | ICD-10-CM

## 2020-01-25 LAB — CBC WITH DIFFERENTIAL (CANCER CENTER ONLY)
Abs Immature Granulocytes: 0.03 10*3/uL (ref 0.00–0.07)
Basophils Absolute: 0 10*3/uL (ref 0.0–0.1)
Basophils Relative: 1 %
Eosinophils Absolute: 0.1 10*3/uL (ref 0.0–0.5)
Eosinophils Relative: 3 %
HCT: 35.7 % — ABNORMAL LOW (ref 36.0–46.0)
Hemoglobin: 12.1 g/dL (ref 12.0–15.0)
Immature Granulocytes: 1 %
Lymphocytes Relative: 39 %
Lymphs Abs: 1.6 10*3/uL (ref 0.7–4.0)
MCH: 32.3 pg (ref 26.0–34.0)
MCHC: 33.9 g/dL (ref 30.0–36.0)
MCV: 95.2 fL (ref 80.0–100.0)
Monocytes Absolute: 0.4 10*3/uL (ref 0.1–1.0)
Monocytes Relative: 9 %
Neutro Abs: 2 10*3/uL (ref 1.7–7.7)
Neutrophils Relative %: 47 %
Platelet Count: 222 10*3/uL (ref 150–400)
RBC: 3.75 MIL/uL — ABNORMAL LOW (ref 3.87–5.11)
RDW: 13.6 % (ref 11.5–15.5)
WBC Count: 4.2 10*3/uL (ref 4.0–10.5)
nRBC: 0 % (ref 0.0–0.2)

## 2020-01-25 MED ORDER — LIDOCAINE HCL 2 % IJ SOLN
INTRAMUSCULAR | Status: AC
  Filled 2020-01-25: qty 20

## 2020-01-25 NOTE — Progress Notes (Signed)
Patient arrived today for bone marrow aspiration. Patient tolerated procedure well. Vitals signs remained stable post procedure and no bleeding noted after 30 minute wait. Patient educated on post procedure care and signs/symptoms for when to call doctor. Patient verbalized understanding and lab work collected.

## 2020-01-25 NOTE — Patient Instructions (Signed)
Bone Marrow Aspiration and Bone Marrow Biopsy, Adult, Care After This sheet gives you information about how to care for yourself after your procedure. Your health care provider may also give you more specific instructions. If you have problems or questions, contact your health care provider. What can I expect after the procedure? After the procedure, it is common to have:  Mild pain and tenderness.  Swelling.  Bruising. Follow these instructions at home: Puncture site care   Follow instructions from your health care provider about how to take care of the puncture site. Make sure you: ? Wash your hands with soap and water before and after you change your bandage (dressing). If soap and water are not available, use hand sanitizer. ? Change your dressing as told by your health care provider.  Check your puncture site every day for signs of infection. Check for: ? More redness, swelling, or pain. ? Fluid or blood. ? Warmth. ? Pus or a bad smell. Activity  Return to your normal activities as told by your health care provider. Ask your health care provider what activities are safe for you.  Do not lift anything that is heavier than 10 lb (4.5 kg), or the limit that you are told, until your health care provider says that it is safe.  Do not drive for 24 hours if you were given a sedative during your procedure. General instructions   Take over-the-counter and prescription medicines only as told by your health care provider.  Do not take baths, swim, or use a hot tub until your health care provider approves. Ask your health care provider if you may take showers. You may only be allowed to take sponge baths.  If directed, put ice on the affected area. To do this: ? Put ice in a plastic bag. ? Place a towel between your skin and the bag. ? Leave the ice on for 20 minutes, 2-3 times a day.  Keep all follow-up visits as told by your health care provider. This is important. Contact a  health care provider if:  Your pain is not controlled with medicine.  You have a fever.  You have more redness, swelling, or pain around the puncture site.  You have fluid or blood coming from the puncture site.  Your puncture site feels warm to the touch.  You have pus or a bad smell coming from the puncture site. Summary  After the procedure, it is common to have mild pain, tenderness, swelling, and bruising.  Follow instructions from your health care provider about how to take care of the puncture site and what activities are safe for you.  Take over-the-counter and prescription medicines only as told by your health care provider.  Contact a health care provider if you have any signs of infection, such as fluid or blood coming from the puncture site. This information is not intended to replace advice given to you by your health care provider. Make sure you discuss any questions you have with your health care provider. Document Revised: 02/27/2019 Document Reviewed: 02/27/2019 Elsevier Patient Education  2020 Elsevier Inc.  

## 2020-01-25 NOTE — Progress Notes (Signed)
INDICATION: metastatic breast cancer, evaluate for marrow invovlement  Brief examination was performed. ENT: adequate airway clearance Heart: regular rate and rhythm.No Murmurs Lungs: clear to auscultation, no wheezes, normal respiratory effort  Bone Marrow Biopsy and Aspiration Procedure Note   Informed consent was obtained and potential risks including bleeding, infection and pain were reviewed with the patient.  The patient's name, date of birth, identification, consent and allergies were verified prior to the start of procedure and time out was performed.  The left posterior iliac crest was chosen as the site of biopsy.  The skin was prepped with ChloraPrep.   8 cc of 2% lidocaine was used to provide local anaesthesia.   10 cc of bone marrow aspirate was obtained followed by 1cm biopsy.  Pressure was applied to the biopsy site and bandage was placed over the biopsy site. Patient was made to lie on the back for 30 mins prior to discharge.  The procedure was tolerated well. COMPLICATIONS: None BLOOD LOSS: none The patient was discharged home in stable condition with a 2 week follow up to review results.  Patient was provided with post bone marrow biopsy instructions and instructed to call if there was any bleeding or worsening pain.  Specimens sent for flow cytometry, cytogenetics and additional studies.  Signed  C , NP   

## 2020-01-28 ENCOUNTER — Telehealth: Payer: Self-pay | Admitting: Oncology

## 2020-01-28 NOTE — Telephone Encounter (Signed)
No 4/2 LOS. No changes made to pt's schedule.

## 2020-01-30 ENCOUNTER — Other Ambulatory Visit: Payer: Self-pay | Admitting: Oncology

## 2020-02-01 ENCOUNTER — Telehealth: Payer: Self-pay | Admitting: *Deleted

## 2020-02-01 NOTE — Telephone Encounter (Signed)
Caris report faxed with printed confirmation as received.

## 2020-02-06 ENCOUNTER — Encounter (HOSPITAL_COMMUNITY): Payer: Self-pay | Admitting: Oncology

## 2020-02-12 NOTE — Progress Notes (Signed)
[facility]  [phone] Fax:(336) [phone]     ID: SHONICA WEIER DOB: November 08, 1955  MR#: [id number]  TGY#:[id number]  Patient Care Team: Binnie Rail, MD as PCP - General (Internal Medicine) Aloha Gell, MD as Consulting Physician (Obstetrics and Gynecology) Sydnee Levans, MD (Dermatology) Aamiyah Derrick, Virgie Dad, MD as Consulting Physician (Oncology) Richmond Campbell, MD as Consulting Physician (Gastroenterology) Stefanie Libel, MD as Consulting Physician (Sports Medicine) Chauncey Cruel, MD OTHER MD:  CHIEF COMPLAINT: Stage IV lobular breast cancer  CURRENT TREATMENT: anastrozole; Delton See   INTERVAL HISTORY: [name] returns today for follow up of her stage IV lobular breast cancer.   We started her on anastrozole empirically.  She did well with this.  However with the results discussed below we are stopping this medication  She began on Xgeva at her last visit on 12/13/2019.  She has had no side effects from the shots.  She takes a little extra calcium on treatment days to prevent hypocalcemia symptoms.  Her most recent bone density on 11/20/2018 showed a T score of -1.0 (normal).  Since her last visit, she underwent [place] PET scan on 01/11/2020. This showed: no evidence of estrogen receptor positive breast cancer; numerous small sclerotic skeletal metastasis in spine and pelvis which no not accumulate ER-specific radiotracer.  She underwent bone marrow biopsy on 01/25/2020 of the iliac crest metastasis. Pathology from the procedure ([id number] ) revealed: hypercellular bone marrow for age with metastatic carcinoma.  Immunohistochemistry find positivity for gross cystic disease fluid protein, but negative for estrogen and progesterone receptor.  Cytogenetics testing was performed on the bone marrow sample, which showed normal female chromosomes.   REVIEW OF SYSTEMS: [name] and her husband just returned from visiting their family in [place] including  their daughter, son-in-law, and 55-year-old granddaughter Penelope ("Pippa").  Both the patient and her husband have had both Pfizer coronavirus vaccine doses and her family in [place] has had the first dose.  Shanikka is exercising regularly, has no pain, fever, rash, bleeding, cough, phlegm production, pleurisy, shortness of breath, change in bowel or bladder habits, unexplained fatigue or unexplained weight loss.  A detailed review of systems was entirely benign.   HISTORY OF CURRENT ILLNESS: From the original intake note:  "[name]" Volk has a history of left breast cancer dating back to 32.  She was followed by Dr. Beryle Beams and underwent lumpectomy, adjuvant chemotherapy, radiation and "hormonal therapy".  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  She took tamoxifen for many years and then raloxifene.  Dr. Beryle Beams also evaluated her for leukopenia which was noted at first in 2011, worked up with a negative ANA and negative rheumatoid factors.  He felt it was a benign residual from her earlier chemotherapy.  She was released from follow-up in 2015.  She has continued on intensified screening because of her family history.  On November 23, 2017 she had bilateral breast MRIs which were unremarkable.  Mammography November 20, 2018 showed no findings suspicious of malignancy.  Breast MRI 10/30/2019 showed a new irregular 0.4 cm enhancing focus in the upper inner quadrant of the right breast.  The left breast continued to be unremarkable except for postoperative changes and there were no abnormal appearing lymph nodes.  However in that same MRI multiple areas of enhancement were noted in the sternum.  This had not been seen in the prior MRI.  Plan right  MRI biopsy 11/16/2019 was canceled as the previously demonstrated 0.4 cm focus in the right breast showed only a tortuous vein at that location.  However bone scan  11/19/2019 obtained to follow-up on the sternal findings showed additional areas of concern at T12, T7-T11, L4 and ribs, as well as the sternum.  The patient's subsequent history is as detailed below.   PAST MEDICAL HISTORY: Past Medical History:  Diagnosis Date  . ANEMIA-NOS   . Asymptomatic varicose veins   . Breast cancer ([place])   . BREAST CANCER, HX OF 01/1995 dx   s/p Lumpectomy, XRT, chemo and 31yrarmidex  . Gestational diabetes mellitus in childbirth, diet controlled 1985  . Hypothyroid 09/28/2015   Dx 09/2015  . Leukopenia    mild since chemo  . OSTEOARTHRITIS   . Personal history of chemotherapy   . Personal history of radiation therapy   . SCOLIOSIS, MILD     PAST SURGICAL HISTORY: Past Surgical History:  Procedure Laterality Date  . BREAST BIOPSY    . BREAST LUMPECTOMY Left    1996  . left lumpectomy  1996  . TONSILLECTOMY    . TOTAL HIP ARTHROPLASTY     2002 & 2003-Dr. vail @ [facility]    FAMILY HISTORY Family History  Problem Relation Age of Onset  . Heart disease Father 591      AMI  . Hyperlipidemia Father   . Valvular heart disease Father 765 . Parkinson's disease Father 761 . Arthritis Mother   . Breast cancer Mother 552 . Hyperlipidemia Mother   . Osteoporosis Mother   . Pancreatic cancer Maternal Grandmother 66       presumed dx  . Diabetes Paternal Grandfather        type 2  . Breast cancer Maternal Aunt   The patient's father died at the age of 850from heart disease.  He had Lewy body dementia.  The patient's mother is 66years old as of January 2021.  She is in a memory unit.  She has a history of breast cancer diagnosed in her 66s(ductal carcinoma in situ).  The patient's mother sister had breast cancer diagnosed around age 66  The patient's mother's mother also had cancer, "internal" (possibly ovarian or gastrointestinal).  There is no cancer on the father's side of the family.   GYNECOLOGIC HISTORY:  No LMP recorded. Patient is  postmenopausal. Menarche: 64years old Age at first live birth: 64years old [place] LMP with chemotherapy, in 1996 Contraceptive a little over a year, with no complications HRT no  Hysterectomy?  No Salpingo-oophorectomy?  No   SOCIAL HISTORY:  DSafiagraduated from [employer] a degree in psychology and IT.  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  She and her husband JClair Gullingmet while working for [employer]  [place] probably were exposed to some radiation at that time.  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  They are thinking about retiring later this year.  Their daughter JAnderson Maltais a hPharmacist, hospitalin [place]  Their son JJenny Reichmannworks for the same wRyland Groupthat the patient works for.  Note that JJenny Reichmanndid the [employer]  The patient has 1 grandchild ([place].  The patient attends [employer] The patient's husband is her healthcare power of  attorney   HEALTH MAINTENANCE: Social History   Tobacco Use  . Smoking status: Former Smoker    Quit date: 06/25/1977    Years since quitting: 42.6  . Smokeless tobacco: Never Used  Substance Use Topics  . Alcohol use: Yes    Alcohol/week: 3.0 - 4.0 standard drinks    Types: 3 - 4 Glasses of wine per week  . Drug use: No     Colonoscopy: 2015  PAP: Up-to-date  Bone density: 11/20/2018, normal   No Known Allergies  Current Outpatient Medications  Medication Sig Dispense Refill  . Calcium Citrate 250 MG TABS Take 4 tablets (1,000 mg total) by mouth daily.  0  . capecitabine (XELODA) 500 MG tablet Take 2 tablets (1,000 mg total) by mouth 2 (two) times daily after a meal. 120 tablet 6  . Cholecalciferol (VITAMIN D3) 1000 UNITS CAPS Take by mouth daily.      . diclofenac sodium (VOLTAREN) 1 % GEL Apply 2 g topically 3 (three) times daily as needed. 100 g 1  . levothyroxine  (SYNTHROID) 50 MCG tablet TAKE 1 TABLET BY MOUTH EVERY DAY 90 tablet 3  . Multiple Vitamin (MULTIVITAMIN) tablet Take 1 tablet by mouth daily.      . Omega-3 Fatty Acids (FISH OIL) 1000 MG CPDR Take by mouth daily.       No current facility-administered medications for this visit.    OBJECTIVE:  white woman who appears younger than stated age  20:   02/13/20 0914  BP: 115/82  Pulse: 70  Resp: 18  Temp: 98.2 F (36.8 C)  SpO2: 100%     Body mass index is 22.23 kg/m.   Wt Readings from Last 3 Encounters:  02/13/20 146 lb 3.2 oz (66.3 kg)  01/11/20 146 lb (66.2 kg)  12/13/19 145 lb (65.8 kg)      ECOG FS:1 - Symptomatic but completely ambulatory  Sclerae unicteric, EOMs intact Wearing a mask No cervical or supraclavicular adenopathy Lungs no rales or rhonchi Heart regular rate and rhythm Abd soft, nontender, positive bowel sounds MSK no focal spinal tenderness, no upper extremity lymphedema Neuro: nonfocal, well oriented, appropriate affect Breasts: The right breast is benign.  The left breast is status post lumpectomy and radiation.  There is marked distortion of the breast contour which is chronic.  There is no evidence of local recurrence.  Both axillae are benign.   LAB RESULTS:  CMP     Component Value Date/Time   NA 133 (L) 01/11/2020 0845   NA 139 12/10/2013 0841   K 4.3 01/11/2020 0845   K 4.5 12/10/2013 0841   CL 102 01/11/2020 0845   CL 102 12/01/2012 0955   CO2 24 01/11/2020 0845   CO2 28 12/10/2013 0841   GLUCOSE 80 01/11/2020 0845   GLUCOSE 81 12/10/2013 0841   GLUCOSE 88 12/01/2012 0955   BUN 10 01/11/2020 0845   BUN 14 08/04/2015 0000   BUN 17.5 12/10/2013 0841   CREATININE 0.83 01/11/2020 0845   CREATININE 0.9 12/10/2013 0841   CALCIUM 8.4 (L) 01/11/2020 0845   CALCIUM 9.6 12/10/2013 0841   PROT 6.9 01/11/2020 0845   PROT 6.7 12/10/2013 0841   ALBUMIN 3.5 01/11/2020 0845   ALBUMIN 3.9 12/10/2013 0841   AST 54 (H) 01/11/2020 0845   AST  22 12/10/2013 0841   ALT 49 (H) 01/11/2020 0845   ALT 27 12/10/2013 0841   ALKPHOS 108 01/11/2020 0845   ALKPHOS 54 12/10/2013 0841  BILITOT 0.3 01/11/2020 0845   BILITOT 0.41 12/10/2013 0841   GFRNONAA >60 01/11/2020 0845   GFRAA >60 01/11/2020 0845    No results found for: Ronnald Ramp, A1GS, A2GS, BETS, BETA2SER, GAMS, MSPIKE, SPEI  No results found for: Nils Pyle, [facility]  Lab Results  Component Value Date   WBC 5.0 02/13/2020   NEUTROABS 2.0 02/13/2020   HGB 12.2 02/13/2020   HCT 35.8 (L) 02/13/2020   MCV 95.0 02/13/2020   PLT 193 02/13/2020      Chemistry      Component Value Date/Time   NA 133 (L) 01/11/2020 0845   NA 139 12/10/2013 0841   K 4.3 01/11/2020 0845   K 4.5 12/10/2013 0841   CL 102 01/11/2020 0845   CL 102 12/01/2012 0955   CO2 24 01/11/2020 0845   CO2 28 12/10/2013 0841   BUN 10 01/11/2020 0845   BUN 14 08/04/2015 0000   BUN 17.5 12/10/2013 0841   CREATININE 0.83 01/11/2020 0845   CREATININE 0.9 12/10/2013 0841   GLU 102 08/04/2015 0000      Component Value Date/Time   CALCIUM 8.4 (L) 01/11/2020 0845   CALCIUM 9.6 12/10/2013 0841   ALKPHOS 108 01/11/2020 0845   ALKPHOS 54 12/10/2013 0841   AST 54 (H) 01/11/2020 0845   AST 22 12/10/2013 0841   ALT 49 (H) 01/11/2020 0845   ALT 27 12/10/2013 0841   BILITOT 0.3 01/11/2020 0845   BILITOT 0.41 12/10/2013 0841      No results found for: LABCA2  No components found for: [id number]  No results for input(s): INR in the last 168 hours.  No results found for: LABCA2  No results found for: CAN199  Lab Results  Component Value Date   YBO175 21.1 11/23/2019    No results found for: ZWC585  Lab Results  Component Value Date   CA2729 64.1 (H) 12/13/2019    No components found for: HGQUANT  Lab Results  Component Value Date   CEA1 18.11 (H) 11/23/2019   /  CEA ([facility]In [facility])  Date Value Ref Range Status  11/23/2019 18.11 (H) 0.00 - 5.00 ng/mL  Final    Comment:    (NOTE) This test was performed using Architect's Chemiluminescent Microparticle Immunoassay. Values obtained from different assay methods cannot be used interchangeably. Please note that 5-10% of patients who smoke may see CEA levels up to 6.9 ng/mL. Performed at [facility], [place] [address] [place], [place] [address]      No results found for: AFPTUMOR  No results found for: CHROMOGRNA   No results found for: HGBA, HGBA2QUANT, HGBFQUANT, HGBSQUAN (Hemoglobinopathy evaluation)   Lab Results  Component Value Date   LDH 193 12/10/2013    No results found for: IRON, TIBC, IRONPCTSAT (Iron and TIBC)  No results found for: FERRITIN  Urinalysis    Component Value Date/Time   COLORURINE YELLOW 09/17/2014 [place] 09/17/2014 0824   LABSPEC 1.015 09/17/2014 0824   PHURINE 7.5 09/17/2014 [place] 09/17/2014 [place] 09/17/2014 [place] 09/17/2014 0824   KETONESUR NEGATIVE 09/17/2014 0824   UROBILINOGEN 0.2 09/17/2014 0824   NITRITE NEGATIVE 09/17/2014 0824   LEUKOCYTESUR NEGATIVE 09/17/2014 0824    STUDIES: No results found.   ELIGIBLE FOR AVAILABLE RESEARCH PROTOCOL: no  ASSESSMENT: 64 y.o. [place] woman status post left lumpectomy 1996 for a T1 N1, anatomic stage II invasive lobular breast cancer, estrogen receptor  positive  (a) status post adjuvant chemotherapy with doxorubicin and cyclophosphamide x4  (b) status post adjuvant radiation  (c) status post tamoxifen for 5+ years  (1) benign leukopenia: noted 2011, felt secondary to prior chemotherapy  (2) genetics testing 06/23/2018 through [facility] Multi[facility]cer panel found no deleterious mutations in the 84 genes tested including BRCA 1-2  (a) 2 variants of uncertain significance were fund in [place] [c.1037C>T (p.Ser346Phe) and c.503C>G (p.ALA168Gly)  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 (3) staging  studies:  (a) breast MRI 10/30/2019 shows sternal mottling  (b) bone scan 11/19/2019 shows multiple areas of uptake suspicious for metastatic disease  (c) CT of the chest abdomen and pelvis 11/28/2019 shows no visceral disease  (d) tumor markers 11/23/2019 shows a CEA of 18.11, CA 27-29 of 61.2, CA 125 of 21.1  (e) F-18-estradiol PET (cerianna) 01/11/2020 shows multiple sclerotic bone lesions which do not accumulate the estrogen receptor specific radiotracer  (f) bone marrow biopsy 01/25/2020 confirms metastatic breast cancer, gross cystic disease fluid protein positive, but estrogen and progesterone receptor negative.  HER-2/neu has been requested (02/13/2020)  (g) CARIS requested (01/28/2020)  (4) denosumab/Xgeva started 12/13/2019, repeated monthly   PLAN: I reviewed the overall situation with Neoma Laming.  The F-18 estradiol scan showed the bone lesions but they are not taking up estrogen.  This means they are estrogen receptor negative, not estrogen dependent, and therefore anastrozole which she was empirically started on will be discontinued.  She then had the bone marrow biopsy and this confirms carcinoma in the bone marrow.  It is gross cystic disease fluid protein positive which means it is breast cancer.  However it is estrogen and progesterone receptor negative, consistent with the other scan.  I have other tests pending.  I have requested her to on the bone marrow material, but this may be not possible to obtain; I have also requested Caris on the bone marrow material which would give [place] a PD-L1 and possibly all other targets including checking for BRCA 1 and 2 (we know the patient is genetically negative for BRCA1 and BRCA2 mutations).  We reviewed the fact that for triple negative breast cancers which is what this woman is looking like the only systemic treatment available is chemotherapy.  The denosumab/Xgeva will help and she is tolerating that well so the plan is to continue that  monthly as before.  The question is what chemotherapy to add if any.  She has such a excellent functional status, with absolutely no symptoms, that I really do not want to make her life worse.  After much discussion we decided we would give metronomic capecitabine a try.  This is a lower dose than the usual therapeutic dose, can be very well-tolerated, and can be taken continuously instead of with interruptions.  We did discuss the possible toxicities side effects and complications of this agent at these doses and I have put in the request.  Target start date is 02/23/2020.  She will return 03/12/2020 for her next Xgeva dose and I will see her that day to make sure she is tolerating treatment well.  By then we should have the additional results if any from the other requested tests.  We will follow her tumor markers, and we will obtain a repeat bone scan in August, 6 months after the start of her denosumab, and that will be the new baseline.  Total encounter time 35 minutes.Chauncey Cruel, MD   02/13/2020 9:49 AM Medical Oncology and Hematology [facility] [place], [place] [address] Tel. [phone]    Fax. [phone]   I, Wilburn Mylar, am acting as scribe for Dr. Virgie Dad. Sammy Cassar.  I, Lurline Del MD, have reviewed the above documentation for accuracy and completeness, and I agree with the above.   *Total Encounter Time as defined by the Centers for Medicare and Medicaid Services includes, in addition to the face-to-face time of a patient visit (documented in the note above) non-face-to-face time: obtaining and reviewing outside history, ordering and reviewing medications, tests or procedures, care coordination (communications with other health care professionals or caregivers) and documentation in the medical record.

## 2020-02-13 ENCOUNTER — Inpatient Hospital Stay: Payer: BC Managed Care – PPO

## 2020-02-13 ENCOUNTER — Other Ambulatory Visit: Payer: Self-pay

## 2020-02-13 ENCOUNTER — Inpatient Hospital Stay: Payer: BC Managed Care – PPO | Admitting: Oncology

## 2020-02-13 ENCOUNTER — Telehealth: Payer: Self-pay | Admitting: Pharmacist

## 2020-02-13 ENCOUNTER — Telehealth: Payer: Self-pay

## 2020-02-13 VITALS — BP 115/82 | HR 70 | Temp 98.2°F | Resp 18 | Ht 68.0 in | Wt 146.2 lb

## 2020-02-13 DIAGNOSIS — Z170 Estrogen receptor positive status [ER+]: Secondary | ICD-10-CM

## 2020-02-13 DIAGNOSIS — C50112 Malignant neoplasm of central portion of left female breast: Secondary | ICD-10-CM

## 2020-02-13 DIAGNOSIS — C7951 Secondary malignant neoplasm of bone: Secondary | ICD-10-CM

## 2020-02-13 DIAGNOSIS — M4120 Other idiopathic scoliosis, site unspecified: Secondary | ICD-10-CM

## 2020-02-13 DIAGNOSIS — M160 Bilateral primary osteoarthritis of hip: Secondary | ICD-10-CM

## 2020-02-13 DIAGNOSIS — Q6589 Other specified congenital deformities of hip: Secondary | ICD-10-CM

## 2020-02-13 DIAGNOSIS — H40039 Anatomical narrow angle, unspecified eye: Secondary | ICD-10-CM

## 2020-02-13 DIAGNOSIS — Z96643 Presence of artificial hip joint, bilateral: Secondary | ICD-10-CM

## 2020-02-13 DIAGNOSIS — I7300 Raynaud's syndrome without gangrene: Secondary | ICD-10-CM

## 2020-02-13 LAB — COMPREHENSIVE METABOLIC PANEL
ALT: 44 U/L (ref 0–44)
AST: 50 U/L — ABNORMAL HIGH (ref 15–41)
Albumin: 3.6 g/dL (ref 3.5–5.0)
Alkaline Phosphatase: 119 U/L (ref 38–126)
Anion gap: 9 (ref 5–15)
BUN: 10 mg/dL (ref 8–23)
CO2: 27 mmol/L (ref 22–32)
Calcium: 8.3 mg/dL — ABNORMAL LOW (ref 8.9–10.3)
Chloride: 100 mmol/L (ref 98–111)
Creatinine, Ser: 0.83 mg/dL (ref 0.44–1.00)
GFR calc Af Amer: >60 mL/min (ref >60)
GFR calc non Af Amer: >60 mL/min (ref >60)
Glucose, Bld: 88 mg/dL (ref 70–99)
Potassium: 4.2 mmol/L (ref 3.5–5.1)
Sodium: 136 mmol/L (ref 135–145)
Total Bilirubin: 0.5 mg/dL (ref 0.3–1.2)
Total Protein: 7.3 g/dL (ref 6.5–8.1)

## 2020-02-13 LAB — CEA (IN HOUSE-CHCC): CEA (CHCC-In House): 17.85 ng/mL — ABNORMAL HIGH (ref 0.00–5.00)

## 2020-02-13 LAB — CBC WITH DIFFERENTIAL/PLATELET
Abs Immature Granulocytes: 0.04 10*3/uL (ref 0.00–0.07)
Basophils Absolute: 0 10*3/uL (ref 0.0–0.1)
Basophils Relative: 1 %
Eosinophils Absolute: 0.1 10*3/uL (ref 0.0–0.5)
Eosinophils Relative: 3 %
HCT: 35.8 % — ABNORMAL LOW (ref 36.0–46.0)
Hemoglobin: 12.2 g/dL (ref 12.0–15.0)
Immature Granulocytes: 1 %
Lymphocytes Relative: 48 %
Lymphs Abs: 2.4 10*3/uL (ref 0.7–4.0)
MCH: 32.4 pg (ref 26.0–34.0)
MCHC: 34.1 g/dL (ref 30.0–36.0)
MCV: 95 fL (ref 80.0–100.0)
Monocytes Absolute: 0.4 10*3/uL (ref 0.1–1.0)
Monocytes Relative: 7 %
Neutro Abs: 2 10*3/uL (ref 1.7–7.7)
Neutrophils Relative %: 40 %
Platelets: 193 10*3/uL (ref 150–400)
RBC: 3.77 MIL/uL — ABNORMAL LOW (ref 3.87–5.11)
RDW: 13.7 % (ref 11.5–15.5)
WBC: 5 10*3/uL (ref 4.0–10.5)
nRBC: 0 % (ref 0.0–0.2)

## 2020-02-13 MED ORDER — DENOSUMAB 120 MG/1.7ML ~~LOC~~ SOLN
SUBCUTANEOUS | Status: AC
  Filled 2020-02-13: qty 1.7

## 2020-02-13 MED ORDER — DENOSUMAB 120 MG/1.7ML ~~LOC~~ SOLN
120.0000 mg | Freq: Once | SUBCUTANEOUS | Status: AC
  Administered 2020-02-13: 120 mg via SUBCUTANEOUS

## 2020-02-13 MED ORDER — CAPECITABINE 500 MG PO TABS: 1000.0000 mg | ORAL_TABLET | Freq: Two times a day (BID) | ORAL | 6 refills | Status: DC

## 2020-02-13 NOTE — Progress Notes (Signed)
Per MD ok to give Xgeva with current calcium. Will express calcium issues to patient 

## 2020-02-13 NOTE — Telephone Encounter (Signed)
Oral Oncology Patient Advocate Encounter  Received notification from [place] that prior authorization for Xeloda is required.  PA submitted on CoverMyMeds Key [id number] Status is pending  Oral Oncology Clinic will continue to follow.  [place] Patient [place] Phone [phone] Fax [phone] 02/13/2020 10:38 AM

## 2020-02-13 NOTE — Patient Instructions (Signed)
Denosumab injection What is this medicine? DENOSUMAB (den oh sue mab) slows bone breakdown. Prolia is used to treat osteoporosis in women after menopause and in men, and in people who are taking corticosteroids for 6 months or more. Xgeva is used to treat a high calcium level due to cancer and to prevent bone fractures and other bone problems caused by multiple myeloma or cancer bone metastases. Xgeva is also used to treat giant cell tumor of the bone. This medicine may be used for other purposes; ask your health care provider or pharmacist if you have questions. COMMON BRAND NAME(S): Prolia, XGEVA What should I tell my health care provider before I take this medicine? They need to know if you have any of these conditions:  dental disease  having surgery or tooth extraction  infection  kidney disease  low levels of calcium or Vitamin D in the blood  malnutrition  on hemodialysis  skin conditions or sensitivity  thyroid or parathyroid disease  an unusual reaction to denosumab, other medicines, foods, dyes, or preservatives  pregnant or trying to get pregnant  breast-feeding How should I use this medicine? This medicine is for injection under the skin. It is given by a health care professional in a hospital or clinic setting. A special MedGuide will be given to you before each treatment. Be sure to read this information carefully each time. For Prolia, talk to your pediatrician regarding the use of this medicine in children. Special care may be needed. For Xgeva, talk to your pediatrician regarding the use of this medicine in children. While this drug may be prescribed for children as young as 13 years for selected conditions, precautions do apply. Overdosage: If you think you have taken too much of this medicine contact a poison control center or emergency room at once. NOTE: This medicine is only for you. Do not share this medicine with others. What if I miss a dose? It is  important not to miss your dose. Call your doctor or health care professional if you are unable to keep an appointment. What may interact with this medicine? Do not take this medicine with any of the following medications:  other medicines containing denosumab This medicine may also interact with the following medications:  medicines that lower your chance of fighting infection  steroid medicines like prednisone or cortisone This list may not describe all possible interactions. Give your health care provider a list of all the medicines, herbs, non-prescription drugs, or dietary supplements you use. Also tell them if you smoke, drink alcohol, or use illegal drugs. Some items may interact with your medicine. What should I watch for while using this medicine? Visit your doctor or health care professional for regular checks on your progress. Your doctor or health care professional may order blood tests and other tests to see how you are doing. Call your doctor or health care professional for advice if you get a fever, chills or sore throat, or other symptoms of a cold or flu. Do not treat yourself. This drug may decrease your body's ability to fight infection. Try to avoid being around people who are sick. You should make sure you get enough calcium and vitamin D while you are taking this medicine, unless your doctor tells you not to. Discuss the foods you eat and the vitamins you take with your health care professional. See your dentist regularly. Brush and floss your teeth as directed. Before you have any dental work done, tell your dentist you are   receiving this medicine. Do not become pregnant while taking this medicine or for 5 months after stopping it. Talk with your doctor or health care professional about your birth control options while taking this medicine. Women should inform their doctor if they wish to become pregnant or think they might be pregnant. There is a potential for serious side  effects to an unborn child. Talk to your health care professional or pharmacist for more information. What side effects may I notice from receiving this medicine? Side effects that you should report to your doctor or health care professional as soon as possible:  allergic reactions like skin rash, itching or hives, swelling of the face, lips, or tongue  bone pain  breathing problems  dizziness  jaw pain, especially after dental work  redness, blistering, peeling of the skin  signs and symptoms of infection like fever or chills; cough; sore throat; pain or trouble passing urine  signs of low calcium like fast heartbeat, muscle cramps or muscle pain; pain, tingling, numbness in the hands or feet; seizures  unusual bleeding or bruising  unusually weak or tired Side effects that usually do not require medical attention (report to your doctor or health care professional if they continue or are bothersome):  constipation  diarrhea  headache  joint pain  loss of appetite  muscle pain  runny nose  tiredness  upset stomach This list may not describe all possible side effects. Call your doctor for medical advice about side effects. You may report side effects to FDA at 1-800-FDA-1088. Where should I keep my medicine? This medicine is only given in a clinic, doctor's office, or other health care setting and will not be stored at home. NOTE: This sheet is a summary. It may not cover all possible information. If you have questions about this medicine, talk to your doctor, pharmacist, or health care provider.  2020 Elsevier/Gold Standard (2018-02-17 16:10:44)

## 2020-02-14 ENCOUNTER — Telehealth: Payer: Self-pay | Admitting: Oncology

## 2020-02-14 LAB — CANCER ANTIGEN 27.29: CA 27.29: 59.2 U/mL — ABNORMAL HIGH (ref 0.0–38.6)

## 2020-02-14 NOTE — Telephone Encounter (Signed)
Scheduled appts per 4/21 los. Pt confirmed appt dates and times.

## 2020-02-15 NOTE — Telephone Encounter (Signed)
Oral Oncology Patient Advocate Encounter  Prior Authorization for Xeloda has been approved.    PA# [id number] Effective dates: 02/13/20 through 02/12/21  Patient must fill at [facility] will continue to follow.   [place] Patient [place] Phone [phone] Fax [phone] 02/15/2020 10:24 AM

## 2020-02-18 ENCOUNTER — Encounter: Payer: Self-pay | Admitting: Oncology

## 2020-02-18 LAB — SURGICAL PATHOLOGY

## 2020-02-18 MED ORDER — CAPECITABINE 500 MG PO TABS: 1000.0000 mg | ORAL_TABLET | Freq: Two times a day (BID) | ORAL | 6 refills | Status: DC

## 2020-02-18 NOTE — Telephone Encounter (Signed)
Oral Oncology Pharmacist Encounter  Received new prescription for Xeloda (capecitabine) for the treatment of metastatic breast cancer, planned duration until disease progression or unacceptable drug toxicity. Planned start on 02/23/20  CMP from 02/13/20 assessed, no relevant lab abnormalities. Prescription dose and frequency assessed.   Current medication list in Epic reviewed, no DDIs with capecitabine identified.  Prescription has been e-scribed to the [facility] for benefits analysis and approval.  Oral Oncology Clinic will continue to follow for insurance authorization, copayment issues, initial counseling and start date.  Darl Pikes, PharmD, BCPS, BCOP, CPP Hematology/Oncology Clinical Pharmacist [facility]/HP/AP Oral [facility] [phone]

## 2020-02-25 NOTE — Telephone Encounter (Signed)
Oral Chemotherapy Pharmacist Encounter  Due to insurance restriction the medication could not be filled at [facility]. Prescription has been e-scribed to [facility].  Darl Pikes, PharmD, BCPS, [facility] Hematology/Oncology Clinical Pharmacist [facility]/AP Oral [facility] [phone]  02/25/2020 2:27 PM

## 2020-02-25 NOTE — Telephone Encounter (Signed)
Oral Chemotherapy Pharmacist Encounter  Spoke with [name] and her Xeloda had just been delivered. She plans on taking her first dose this evening with dinner.  Patient Education I spoke with patient for overview of new oral chemotherapy medication: Xeloda (capecitabine) for the treatment of metastatic breast cancer, planned duration until disease progression or unacceptable drug toxicity.  Counseled patient on administration, dosing, side effects, monitoring, drug-food interactions, safe handling, storage, and disposal. Patient will take 2 tablets (1,000 mg total) by mouth 2 (two) times daily after a meal.  Side effects include but not limited to: diarrhea, N/V, hand-foot syndrome, edema, fatigue, decreased wbc.    Reviewed with patient importance of keeping a medication schedule and plan for any missed doses.  [name] voiced understanding and appreciation. All questions answered. Medication handout placed in the mail.  Provided patient with [facility] phone number. Patient knows to call the office with questions or concerns. Oral [facility] will continue to follow.  Darl Pikes, PharmD, BCPS, BCOP, CPP Hematology/Oncology Clinical Pharmacist [facility]/AP [facility] [phone]  02/25/2020 2:27 PM

## 2020-03-12 ENCOUNTER — Inpatient Hospital Stay: Payer: BC Managed Care – PPO | Attending: Oncology

## 2020-03-12 ENCOUNTER — Inpatient Hospital Stay: Payer: BC Managed Care – PPO

## 2020-03-12 ENCOUNTER — Inpatient Hospital Stay: Payer: BC Managed Care – PPO | Admitting: Oncology

## 2020-03-12 ENCOUNTER — Other Ambulatory Visit: Payer: Self-pay

## 2020-03-12 VITALS — BP 134/75 | HR 82 | Temp 98.7°F | Resp 18 | Ht 68.0 in | Wt 146.5 lb

## 2020-03-12 DIAGNOSIS — Q6589 Other specified congenital deformities of hip: Secondary | ICD-10-CM

## 2020-03-12 DIAGNOSIS — C50112 Malignant neoplasm of central portion of left female breast: Secondary | ICD-10-CM

## 2020-03-12 DIAGNOSIS — C7951 Secondary malignant neoplasm of bone: Secondary | ICD-10-CM | POA: Diagnosis not present

## 2020-03-12 DIAGNOSIS — M160 Bilateral primary osteoarthritis of hip: Secondary | ICD-10-CM

## 2020-03-12 DIAGNOSIS — I7300 Raynaud's syndrome without gangrene: Secondary | ICD-10-CM

## 2020-03-12 DIAGNOSIS — Z170 Estrogen receptor positive status [ER+]: Secondary | ICD-10-CM

## 2020-03-12 DIAGNOSIS — H40039 Anatomical narrow angle, unspecified eye: Secondary | ICD-10-CM

## 2020-03-12 DIAGNOSIS — Z96643 Presence of artificial hip joint, bilateral: Secondary | ICD-10-CM

## 2020-03-12 LAB — COMPREHENSIVE METABOLIC PANEL
ALT: 42 U/L (ref 0–44)
AST: 47 U/L — ABNORMAL HIGH (ref 15–41)
Albumin: 3.5 g/dL (ref 3.5–5.0)
Alkaline Phosphatase: 119 U/L (ref 38–126)
Anion gap: 7 (ref 5–15)
BUN: 14 mg/dL (ref 8–23)
CO2: 26 mmol/L (ref 22–32)
Calcium: 8.9 mg/dL (ref 8.9–10.3)
Chloride: 101 mmol/L (ref 98–111)
Creatinine, Ser: 0.83 mg/dL (ref 0.44–1.00)
GFR calc Af Amer: >60 mL/min (ref >60)
GFR calc non Af Amer: >60 mL/min (ref >60)
Glucose, Bld: 81 mg/dL (ref 70–99)
Potassium: 5.3 mmol/L — ABNORMAL HIGH (ref 3.5–5.1)
Sodium: 134 mmol/L — ABNORMAL LOW (ref 135–145)
Total Bilirubin: 0.4 mg/dL (ref 0.3–1.2)
Total Protein: 7.1 g/dL (ref 6.5–8.1)

## 2020-03-12 LAB — CBC WITH DIFFERENTIAL/PLATELET
Abs Immature Granulocytes: 0.01 10*3/uL (ref 0.00–0.07)
Basophils Absolute: 0 10*3/uL (ref 0.0–0.1)
Basophils Relative: 0 %
Eosinophils Absolute: 0.1 10*3/uL (ref 0.0–0.5)
Eosinophils Relative: 2 %
HCT: 34 % — ABNORMAL LOW (ref 36.0–46.0)
Hemoglobin: 11.4 g/dL — ABNORMAL LOW (ref 12.0–15.0)
Immature Granulocytes: 0 %
Lymphocytes Relative: 38 %
Lymphs Abs: 2 10*3/uL (ref 0.7–4.0)
MCH: 33 pg (ref 26.0–34.0)
MCHC: 33.5 g/dL (ref 30.0–36.0)
MCV: 98.6 fL (ref 80.0–100.0)
Monocytes Absolute: 0.4 10*3/uL (ref 0.1–1.0)
Monocytes Relative: 8 %
Neutro Abs: 2.7 10*3/uL (ref 1.7–7.7)
Neutrophils Relative %: 52 %
Platelets: 193 10*3/uL (ref 150–400)
RBC: 3.45 MIL/uL — ABNORMAL LOW (ref 3.87–5.11)
RDW: 15.6 % — ABNORMAL HIGH (ref 11.5–15.5)
WBC: 5.2 10*3/uL (ref 4.0–10.5)
nRBC: 0 % (ref 0.0–0.2)

## 2020-03-12 MED ORDER — DENOSUMAB 120 MG/1.7ML ~~LOC~~ SOLN
SUBCUTANEOUS | Status: AC
  Filled 2020-03-12: qty 1.7

## 2020-03-12 MED ORDER — DENOSUMAB 120 MG/1.7ML ~~LOC~~ SOLN
120.0000 mg | Freq: Once | SUBCUTANEOUS | Status: AC
  Administered 2020-03-12: 120 mg via SUBCUTANEOUS

## 2020-03-12 NOTE — Progress Notes (Signed)
Brittany [name]  [phone] [phone]     ID: ARSHI DUARTE DOB: Apr 29, 1956  MR#: [id number]  JIR#:[id number]  Patient Care Team: Binnie Rail, MD as PCP - General (Internal Medicine) Aloha Gell, MD as Consulting Physician (Obstetrics and Gynecology) Sydnee Levans, MD (Dermatology) Magrinat, Virgie Dad, MD as Consulting Physician (Oncology) Richmond Campbell, MD as Consulting Physician (Gastroenterology) Stefanie Libel, MD as Consulting Physician (Sports Medicine) Chauncey Cruel, MD OTHER MD:  CHIEF COMPLAINT: Stage IV lobular breast cancer  CURRENT TREATMENT: anastrozole; Delton See; capecitabine   INTERVAL HISTORY: Brittany [name] returns today for follow up of her stage IV lobular breast cancer.   She began [place] on 12/13/2019.  She has had no side effects from the shots.  She takes a little extra calcium on treatment days to prevent hypocalcemia symptoms.  She was started on capecitabine on 02/25/2020.  She is receiving metronomic doses, 1000 mg twice daily.  After being on this for a week (she did go to the beach and did walk on the sand quite a bit) she developed significant tingling in the heels and balls of the feet whenever she stands or walks.  She does not have any tingling or numbness issues when she is sitting or lying down and she has had no peeling or redness.  She has had no hand symptoms.  REVIEW OF SYSTEMS: Brittany [name] has not been able to play golf or tennis which is a major problem for her because of the foot problem.  She has had no diarrhea and no mouth sores, no nausea or vomiting issues and no significant fatigue.  A detailed review of systems was otherwise stable   HISTORY OF CURRENT ILLNESS: From the original intake note:  "Brittany" [name] has a history of left breast cancer dating back to 55.  She was followed by Dr. Beryle Beams and underwent lumpectomy, adjuvant chemotherapy, radiation and "hormonal therapy".  Per Brittany's recollection the  tumor was less than 2 cm, 2 out of 10 lymph nodes were involved, and it was a lobular breast cancer.  She took tamoxifen for many years and then raloxifene.  Dr. Beryle Beams also evaluated her for leukopenia which was noted at first in 2011, worked up with a negative ANA and negative rheumatoid factors.  He felt it was a benign residual from her earlier chemotherapy.  She was released from follow-up in 2015.  She has continued on intensified screening because of her family history.  On November 23, 2017 she had bilateral breast MRIs which were unremarkable.  Mammography November 20, 2018 showed no findings suspicious of malignancy.  Breast MRI 10/30/2019 showed a new irregular 0.4 cm enhancing focus in the upper inner quadrant of the right breast.  The left breast continued to be unremarkable except for postoperative changes and there were no abnormal appearing lymph nodes.  However in that same MRI multiple areas of enhancement were noted in the sternum.  This had not been seen in the prior MRI.  Plan right MRI biopsy 11/16/2019 was canceled as the previously demonstrated 0.4 cm focus in the right breast showed only a tortuous vein at that location.  However bone scan 11/19/2019 obtained to follow-up on the sternal findings showed additional areas of concern at T12, T7-T11, L4 and ribs, as well as the sternum.  The patient's subsequent history is as detailed below.   PAST MEDICAL HISTORY: Past Medical History:  Diagnosis Date  . ANEMIA-NOS   . Asymptomatic varicose veins   . Breast cancer (  [place])   . BREAST CANCER, HX OF 01/1995 dx   s/p Lumpectomy, XRT, chemo and 64yrarmidex  . Gestational diabetes mellitus in childbirth, diet controlled 1985  . Hypothyroid 09/28/2015   Dx 09/2015  . Leukopenia    mild since chemo  . OSTEOARTHRITIS   . Personal history of chemotherapy   . Personal history of radiation therapy   . SCOLIOSIS, MILD     PAST SURGICAL HISTORY: Past Surgical History:   Procedure Laterality Date  . BREAST BIOPSY    . BREAST LUMPECTOMY Left    1996  . left lumpectomy  1996  . TONSILLECTOMY    . TOTAL HIP ARTHROPLASTY     2002 & 2003-Dr. vail @ [facility]    FAMILY HISTORY Family History  Problem Relation Age of Onset  . Heart disease Father 57      AMI  . Hyperlipidemia Father   . Valvular heart disease Father 723 . Parkinson's disease Father 777 . Arthritis Mother   . Breast cancer Mother 5104 . Hyperlipidemia Mother   . Osteoporosis Mother   . Pancreatic cancer Maternal Grandmother 73       presumed dx  . Diabetes Paternal Grandfather        type 2  . Breast cancer Maternal Aunt   The patient's father died at the age of 66109from heart disease.  He had Lewy body dementia.  The patient's mother is 661years old as of January 2021.  She is in a memory unit.  She has a history of breast cancer diagnosed in her 66s(ductal carcinoma in situ).  The patient's mother sister had breast cancer diagnosed around age 66  The patient's mother's mother also had cancer, "internal" (possibly ovarian or gastrointestinal).  There is no cancer on the father's side of the family.   GYNECOLOGIC HISTORY:  No LMP recorded. Patient is postmenopausal. Menarche: 64years old Age at first live birth: 64years [place] L[place] with chemotherapy, in 1996 Contraceptive a little over a year, with no complications HRT no  Hysterectomy?  No Salpingo-oophorectomy?  No   SOCIAL HISTORY:  [place]uated from [place] [place] a degree in psychology and IT.  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  She and her husband JClair Gullingmet while working for [employer]  [place] probably [place] exposed to some radiation at that time.  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  They are thinking about retiring later this year.  Their daughter JAnderson Maltais a hPharmacist, hospitalin [place]  Their son [name] works for the same wRyland Groupthat the patient works for.  Note that JJenny Reichmanndid the PUnisys Corporation  The patient has 1 grandchild (in [place].  The patient [place] [employer] [place] The patient's husband is her healthcare power of attorney   HEALTH MAINTENANCE: Social History   Tobacco Use  . Smoking status: Former Smoker    Quit date: 06/25/1977    Years since quitting: 42.7  . Smokeless tobacco: Never Used  Substance Use Topics  . Alcohol use: Yes    Alcohol/week: 3.0 - 4.0 standard drinks    Types: 3 - 4 Glasses of wine per week  . Drug use: No     Colonoscopy: 2015  PAP: Up-to-date  Bone density: 11/20/2018, normal  No Known Allergies  Current Outpatient Medications  Medication Sig Dispense Refill  . Calcium Citrate 250 MG TABS Take 4 tablets (1,000 mg total) by mouth daily.  0  . capecitabine (XELODA) 500 MG tablet Take 2 tablets (1,000 mg total) by mouth 2 (two) times daily after a meal. 120 tablet 6  . Cholecalciferol (VITAMIN D3) 1000 UNITS CAPS Take by mouth daily.      . diclofenac sodium (VOLTAREN) 1 % GEL Apply 2 g topically 3 (three) times daily as needed. 100 g 1  . levothyroxine (SYNTHROID) 50 MCG tablet TAKE 1 TABLET BY MOUTH EVERY DAY 90 tablet 3  . Multiple Vitamin (MULTIVITAMIN) tablet Take 1 tablet by mouth daily.      . Omega-3 Fatty Acids (FISH OIL) 1000 MG CPDR Take by mouth daily.       No current facility-administered medications for this visit.    OBJECTIVE:  white woman in no acute distress  Vitals:   03/12/20 1040  BP: 134/75  Pulse: 82  Resp: 18  Temp: 98.7 F (37.1 C)  SpO2: 100%     Body mass index is 22.28 kg/m.   Wt Readings from Last 3 Encounters:  03/12/20 146 lb 8 oz (66.5 kg)  02/13/20 146 lb 3.2 oz (66.3 kg)  01/11/20 146 lb (66.2 kg)      ECOG FS:1 - Symptomatic but completely ambulatory  Sclerae unicteric, EOMs intact Wearing a mask No cervical or supraclavicular adenopathy  Lungs no rales or rhonchi Heart regular rate and rhythm Abd soft, nontender, positive bowel sounds MSK no focal spinal tenderness, no upper extremity lymphedema Neuro: nonfocal, well oriented, appropriate affect Breasts: The right breast is unremarkable.  The left breast is status post lumpectomy and radiation.  There is no change from baseline with marked distortion in the contour.  Both axillae are benign. Skin: The plantar surfaces of the feet show no peeling or redness.  There is normal callus and normal sensation.  LAB RESULTS:  CMP     Component Value Date/Time   NA 134 (L) 03/12/2020 1025   NA 139 12/10/2013 0841   K 5.3 (H) 03/12/2020 1025   K 4.5 12/10/2013 0841   CL 101 03/12/2020 1025   CL 102 12/01/2012 0955   CO2 26 03/12/2020 1025   CO2 28 12/10/2013 0841   GLUCOSE 81 03/12/2020 1025   GLUCOSE 81 12/10/2013 0841   GLUCOSE 88 12/01/2012 0955   BUN 14 03/12/2020 1025   BUN 14 08/04/2015 0000   BUN 17.5 12/10/2013 0841   CREATININE 0.83 03/12/2020 1025   CREATININE 0.9 12/10/2013 0841   CALCIUM 8.9 03/12/2020 1025   CALCIUM 9.6 12/10/2013 0841   PROT 7.1 03/12/2020 1025   PROT 6.7 12/10/2013 0841   ALBUMIN 3.5 03/12/2020 1025   ALBUMIN 3.9 12/10/2013 0841   AST 47 (H) 03/12/2020 1025   AST 22 12/10/2013 0841   ALT 42 03/12/2020 1025   ALT 27 12/10/2013 0841   ALKPHOS 119 03/12/2020 1025   ALKPHOS 54 12/10/2013 0841   BILITOT 0.4 03/12/2020 1025   BILITOT 0.41 12/10/2013 0841   GFRNONAA >60 03/12/2020 1025   GFRAA >60 03/12/2020 1025    No results found for: TOTALPROTELP, ALBUMINELP, A1GS, A2GS, BETS, BETA2SER, GAMS, MSPIKE, SPEI  No results found for: KPAFRELGTCHN, LAMBDASER, KAPLAMBRATIO  Lab Results  Component Value Date   WBC 5.2 03/12/2020   NEUTROABS 2.7 03/12/2020   HGB 11.4 (L) 03/12/2020   HCT 34.0 (L) 03/12/2020  MCV 98.6 03/12/2020   PLT 193 03/12/2020      Chemistry      Component Value Date/Time   NA 134 (L) 03/12/2020 1025    NA 139 12/10/2013 0841   K 5.3 (H) 03/12/2020 1025   K 4.5 12/10/2013 0841   CL 101 03/12/2020 1025   CL 102 12/01/2012 0955   CO2 26 03/12/2020 1025   CO2 28 12/10/2013 0841   BUN 14 03/12/2020 1025   BUN 14 08/04/2015 0000   BUN 17.5 12/10/2013 0841   CREATININE 0.83 03/12/2020 1025   CREATININE 0.9 12/10/2013 0841   GLU 102 08/04/2015 0000      Component Value Date/Time   CALCIUM 8.9 03/12/2020 1025   CALCIUM 9.6 12/10/2013 0841   ALKPHOS 119 03/12/2020 1025   ALKPHOS 54 12/10/2013 0841   AST 47 (H) 03/12/2020 1025   AST 22 12/10/2013 0841   ALT 42 03/12/2020 1025   ALT 27 12/10/2013 0841   BILITOT 0.4 03/12/2020 1025   BILITOT 0.41 12/10/2013 0841      No results found for: LABCA2  No components found for: MNOTRR116  No results for input(s): INR in the last 168 hours.  No results found for: LABCA2  No results found for: CAN199  Lab Results  Component Value Date   FBX038 21.1 11/23/2019    No results found for: BFX832  Lab Results  Component Value Date   CA2729 59.2 (H) 02/13/2020    No components found for: HGQUANT  Lab Results  Component Value Date   CEA1 17.85 (H) 02/13/2020   /  CEA ([facility])  Date Value Ref Range Status  02/13/2020 17.85 (H) 0.00 - 5.00 ng/mL Final    Comment:    (NOTE) This test was performed using Architect's Chemiluminescent Microparticle Immunoassay. Values obtained from different assay methods cannot be used interchangeably. Please note that 5-10% of patients who smoke may see CEA levels up to 6.9 ng/mL. Performed at [facility], [place] [address] [place], [place] [address]      No results found for: AFPTUMOR  No results found for: CHROMOGRNA   No results found for: HGBA, HGBA2QUANT, HGBFQUANT, HGBSQUAN (Hemoglobinopathy evaluation)   Lab Results  Component Value Date   LDH 193 12/10/2013    No results found for: IRON, TIBC, IRONPCTSAT (Iron and TIBC)  No results found for:  FERRITIN  Urinalysis    Component Value Date/Time   COLORURINE YELLOW 09/17/2014 [place] 09/17/2014 0824   LABSPEC 1.015 09/17/2014 0824   PHURINE 7.5 09/17/2014 [place] 09/17/2014 [place] 09/17/2014 [place] 09/17/2014 0824   KETONESUR NEGATIVE 09/17/2014 0824   UROBILINOGEN 0.2 09/17/2014 0824   NITRITE NEGATIVE 09/17/2014 0824   LEUKOCYTESUR NEGATIVE 09/17/2014 0824    STUDIES: No results found.   ELIGIBLE FOR AVAILABLE RESEARCH PROTOCOL: no  ASSESSMENT: 64 y.o. [place] woman status post left lumpectomy 1996 for a T1 N1, anatomic stage II invasive lobular breast cancer, estrogen receptor positive  (a) status post adjuvant chemotherapy with doxorubicin and cyclophosphamide x4  (b) status post adjuvant radiation  (c) status post tamoxifen for 5+ years  (1) benign leukopenia: noted 2011, felt secondary to prior chemotherapy  (2) genetics testing 06/23/2018 through [facility]cer panel found no deleterious mutations in the 84 genes tested including BRCA 1-2  (a) 2 variants of uncertain significance were fund in MSH6 [c.1037C>T (p.Ser346Phe) and c.503C>G (p.ALA168Gly)  METASTATIC  DISEASE: January 2021 (3) staging studies:  (a) breast MRI 10/30/2019 shows sternal mottling  (b) bone scan 11/19/2019 shows multiple areas of uptake suspicious for metastatic disease  (c) CT of the chest abdomen and pelvis 11/28/2019 shows no visceral disease  (d) tumor markers 11/23/2019 shows a CEA of 18.11, CA 27-29 of 61.2, CA 125 of 21.1  (e) F-18-estradiol PET (cerianna) 01/11/2020 shows multiple sclerotic bone lesions which do not accumulate the estrogen receptor specific radiotracer  (f) bone marrow biopsy 01/25/2020 confirms metastatic breast cancer, gross cystic disease fluid protein positive, but estrogen and progesterone receptor negative.  HER-2/neu was 2+ on immunohistochemistry negative by FISH (1.35/1.70).   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  PTEN was positive by IHC at 90%.  PD-L1 was negative.  Genomic LOH was low and MSI was stable with proficient mismatch mismatch repair.  BRCA 1 and 2 were negative.  (4) denosumab/Xgeva started 12/13/2019, repeated monthly  (5) metronomic capecitabine started 02/25/2020, with significant plantar dysesthesia developing after 1 week, drug held 03/12/2020  (a) other options include CMF, sacituzumab govitecan, bicalutamide   PLAN: Brittany only took the capecitabine for 1 week, and at low doses, before she developed significant plantar symptoms.  They are quite convincing.  She has no skin peeling but pins-and-needles whenever she stands or walks.  She has thankfully no hand issues.  We are stopping the capecitabine and giving her a week to recover.  Hopefully around that time we will be able to resume and we are going to simply go to 1000 mg once a day.  If she tolerates that well after 2weeks we will consider perhaps adding an additional 54m in the afternoon.  If she absolutely cannot tolerate capecitabine we would have to go into CMF and we discussed that at length today.  She tells me she would like a port placed if we did that  We reviewed the Caris results.  Unfortunately KBeryle Flockis not going to be an option.  Alpelisib might be as well as antit androgens.  Of course sacituzumab govitecan would be an option as well but we are going to save that for measurable visual disease.  I am going to start checking the tumor markers on a monthly basis as we do not have much to measure otherwise.  Total encounter time 35 minutes.*Chauncey Cruel MD   03/12/2020 2:25 PM Medical Oncology and Hematology [employer] [id number]. [phone]   Fax. [phone]  I, KWilburn Mylar am acting as scribe for Dr. GVirgie Dad  Magrinat.  I, GLurline DelMD, have reviewed the above documentation for accuracy and completeness, and I agree with the above.   *Total Encounter Time as defined by the Centers for Medicare and Medicaid Services includes, in addition to the face-to-face time of a patient visit (documented in the note above) non-face-to-face time: obtaining and reviewing outside history, ordering and reviewing medications, tests or procedures, care coordination (communications with other health care professionals or caregivers) and documentation in the medical record.

## 2020-03-13 ENCOUNTER — Telehealth: Payer: Self-pay | Admitting: Oncology

## 2020-03-13 NOTE — Telephone Encounter (Signed)
Scheduled appts per 5/19 los. Pt confirmed appt dates and times.  

## 2020-03-18 NOTE — Progress Notes (Signed)
Brittany [name]  [phone] [phone]     ID: Brittany [name] DOB: Jan 25, 1956  MR#: [id number]  ZMO#:[id number]  Patient Care Team: Binnie Rail, MD as PCP - General (Internal Medicine) Aloha Gell, MD as Consulting Physician (Obstetrics and Gynecology) Sydnee Levans, MD (Dermatology) Babetta Paterson, Virgie Dad, MD as Consulting Physician (Oncology) Richmond Campbell, MD as Consulting Physician (Gastroenterology) Stefanie Libel, MD as Consulting Physician (Sports Medicine) Chauncey Cruel, MD OTHER MD:  I connected with Brittany [name] on 03/19/20 at 12:30 PM EDT by video enabled telemedicine visit and verified that I am speaking with the correct person using two identifiers.   I discussed the limitations, risks, security and privacy concerns of performing an evaluation and management service by telemedicine and the availability of in-person appointments. I also discussed with the patient that there may be a patient responsible charge related to this service. The patient expressed understanding and agreed to proceed.   Other persons participating in the visit and their role in the encounter: her husband Brittany [name]  Patient's location: home  Provider's location: [place]    CHIEF COMPLAINT: Stage IV lobular breast cancer  CURRENT TREATMENT: anastrozole; Xgeva; capecitabine   INTERVAL HISTORY: Brittany [name] was contacted today for follow up of her stage IV lobular breast cancer.  Her husband Brittany [name] also participated in the call  She began [place] on 12/13/2019.  She has had no side effects from the shots.  She takes a little extra calcium on treatment days to prevent hypocalcemia symptoms.  She was started on capecitabine on 02/25/2020.  After only 1 week she developed significant plantar symptoms.  There was peeling, pain, and disability.  We stop the medication and the point of today's call was to make sure things were getting better   REVIEW OF  SYSTEMS: Brittany [name] tells me her feet are in fact better although not yet completely normal.  There is no more peeling.  She is able to walk and in fact she played golf yesterday.  She has found that wearing socks and always wearing shoes and not going about barefoot is helpful.  She still has no hand problems although she did develop a small blister after playing golf.  Otherwise she is planning to retire at the end of the week and is planning to go to the mountains and do some hiking late next week   HISTORY OF CURRENT ILLNESS: From the original intake note:  "Brittany [name]" Streater has a history of left breast cancer dating back to 3.  She was followed by Dr. Beryle Beams and underwent lumpectomy, adjuvant chemotherapy, radiation and "hormonal therapy".  Per Brittany [name]'s recollection the tumor was less than 2 cm, 2 out of 10 lymph nodes were involved, and it was a lobular breast cancer.  She took tamoxifen for many years and then raloxifene.  Dr. Beryle Beams also evaluated her for leukopenia which was noted at first in 2011, worked up with a negative ANA and negative rheumatoid factors.  He felt it was a benign residual from her earlier chemotherapy.  She was released from follow-up in 2015.  She has continued on intensified screening because of her family history.  On November 23, 2017 she had bilateral breast MRIs which were unremarkable.  Mammography November 20, 2018 showed no findings suspicious of malignancy.  Breast MRI 10/30/2019 showed a new irregular 0.4 cm enhancing focus in the upper inner quadrant of the right breast.  The left breast continued to be unremarkable except for postoperative  changes and there were no abnormal appearing lymph nodes.  However in that same MRI multiple areas of enhancement were noted in the sternum.  This had not been seen in the prior MRI.  Plan right MRI biopsy 11/16/2019 was canceled as the previously demonstrated 0.4 cm focus in the right breast showed only a tortuous vein at  that location.  However bone scan 11/19/2019 obtained to follow-up on the sternal findings showed additional areas of concern at T12, T7-T11, L4 and ribs, as well as the sternum.  The patient's subsequent history is as detailed below.   PAST MEDICAL HISTORY: Past Medical History:  Diagnosis Date  . ANEMIA-NOS   . Asymptomatic varicose veins   . Breast cancer ([place])   . BREAST CANCER, HX OF 01/1995 dx   s/p Lumpectomy, XRT, chemo and 11yrarmidex  . Gestational diabetes mellitus in childbirth, diet controlled 1985  . Hypothyroid 09/28/2015   Dx 09/2015  . Leukopenia    mild since chemo  . OSTEOARTHRITIS   . Personal history of chemotherapy   . Personal history of radiation therapy   . SCOLIOSIS, MILD     PAST SURGICAL HISTORY: Past Surgical History:  Procedure Laterality Date  . BREAST BIOPSY    . BREAST LUMPECTOMY Left    1996  . left lumpectomy  1996  . TONSILLECTOMY    . TOTAL HIP ARTHROPLASTY     2002 & 2003-Dr. vail @ [facility]    FAMILY HISTORY Family History  Problem Relation Age of Onset  . Heart disease Father 553      AMI  . Hyperlipidemia Father   . Valvular heart disease Father 776 . Parkinson's disease Father 78 . Arthritis Mother   . Breast cancer Mother 538 . Hyperlipidemia Mother   . Osteoporosis Mother   . Pancreatic cancer Maternal Grandmother 73       presumed dx  . Diabetes Paternal Grandfather        type 2  . Breast cancer Maternal Aunt   The patient's father died at the age of 862from heart disease.  He had Lewy body dementia.  The patient's mother is 834years old as of January 2021.  She is in a memory unit.  She has a history of breast cancer diagnosed in her 59s(ductal carcinoma in situ).  The patient's mother sister had breast cancer diagnosed around age 750  The patient's mother's mother also had cancer, "internal" (possibly ovarian or gastrointestinal).  There is no cancer on the father's side of the family.   GYNECOLOGIC HISTORY:  No  LMP recorded. Patient is postmenopausal. Menarche: 64years old Age at first live birth: 64years old [place] 2 LMP with chemotherapy, in 1996 Contraceptive a little over a year, with no complications HRT no  Hysterectomy?  No Salpingo-oophorectomy?  No   SOCIAL HISTORY:  [place]uated from [place] a degree in psychology and IT.  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  She and her husband Brittany Gullingmet while working for [employer]  [place] exposed to some [place] at that time.  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  They are thinking about retiring later this year.  Their daughter Brittany [name] a hPharmacist, hospitalin [place]  Their son JJenny Reichmannworks for the same wRyland Groupthat the patient works  for.  Note that Brittany [name] did the [employer].  The patient has 1 grandchild (in [place]).  The patient attends [employer]: The patient's husband is her healthcare power of attorney   HEALTH MAINTENANCE: Social History   Tobacco Use  . Smoking status: Former Smoker    Quit date: 06/25/1977    Years since quitting: 42.7  . Smokeless tobacco: Never Used  Substance Use Topics  . Alcohol use: Yes    Alcohol/week: 3.0 - 4.0 standard drinks    Types: 3 - 4 Glasses of wine per week  . Drug use: No     Colonoscopy: 2015  PAP: Up-to-date  Bone density: 11/20/2018, normal   No Known Allergies  Current Outpatient Medications  Medication Sig Dispense Refill  . Calcium Citrate 250 MG TABS Take 4 tablets (1,000 mg total) by mouth daily.  0  . capecitabine (XELODA) 500 MG tablet Take 2 tablets (1,000 mg total) by mouth 2 (two) times daily after a meal. 120 tablet 6  . Cholecalciferol (VITAMIN D3) 1000 UNITS CAPS Take by mouth daily.      . diclofenac sodium (VOLTAREN) 1 % GEL Apply 2 g topically 3 (three) times daily as needed. 100 g 1   . levothyroxine (SYNTHROID) 50 MCG tablet TAKE 1 TABLET BY MOUTH EVERY DAY 90 tablet 3  . Multiple Vitamin (MULTIVITAMIN) tablet Take 1 tablet by mouth daily.      . Omega-3 Fatty Acids (FISH OIL) 1000 MG CPDR Take by mouth daily.       No current facility-administered medications for this visit.    OBJECTIVE:  white woman who appears well  There were no vitals filed for this visit.   There is no height or weight on file to calculate BMI.   Wt Readings from Last 3 Encounters:  03/12/20 146 lb 8 oz (66.5 kg)  02/13/20 146 lb 3.2 oz (66.3 kg)  01/11/20 146 lb (66.2 kg)      ECOG FS:1 - Symptomatic but completely ambulatory  Televisit  LAB RESULTS:  CMP     Component Value Date/Time   NA 134 (L) 03/12/2020 1025   NA 139 12/10/2013 0841   K 5.3 (H) 03/12/2020 1025   K 4.5 12/10/2013 0841   CL 101 03/12/2020 1025   CL 102 12/01/2012 0955   CO2 26 03/12/2020 1025   CO2 28 12/10/2013 0841   GLUCOSE 81 03/12/2020 1025   GLUCOSE 81 12/10/2013 0841   GLUCOSE 88 12/01/2012 0955   BUN 14 03/12/2020 1025   BUN 14 08/04/2015 0000   BUN 17.5 12/10/2013 0841   CREATININE 0.83 03/12/2020 1025   CREATININE 0.9 12/10/2013 0841   CALCIUM 8.9 03/12/2020 1025   CALCIUM 9.6 12/10/2013 0841   PROT 7.1 03/12/2020 1025   PROT 6.7 12/10/2013 0841   ALBUMIN 3.5 03/12/2020 1025   ALBUMIN 3.9 12/10/2013 0841   AST 47 (H) 03/12/2020 1025   AST 22 12/10/2013 0841   ALT 42 03/12/2020 1025   ALT 27 12/10/2013 0841   ALKPHOS 119 03/12/2020 1025   ALKPHOS 54 12/10/2013 0841   BILITOT 0.4 03/12/2020 1025   BILITOT 0.41 12/10/2013 0841   GFRNONAA >60 03/12/2020 1025   GFRAA >60 03/12/2020 1025    No results found for: TOTALPROTELP, ALBUMINELP, A1GS, A2GS, BETS, BETA2SER, GAMS, MSPIKE, SPEI  No results found for: KPAFRELGTCHN, LAMBDASER, KAPLAMBRATIO  Lab Results  Component Value Date   WBC 5.2 03/12/2020   NEUTROABS 2.7  03/12/2020   HGB 11.4 (L) 03/12/2020   HCT 34.0 (L) 03/12/2020    MCV 98.6 03/12/2020   PLT 193 03/12/2020      Chemistry      Component Value Date/Time   NA 134 (L) 03/12/2020 1025   NA 139 12/10/2013 0841   K 5.3 (H) 03/12/2020 1025   K 4.5 12/10/2013 0841   CL 101 03/12/2020 1025   CL 102 12/01/2012 0955   CO2 26 03/12/2020 1025   CO2 28 12/10/2013 0841   BUN 14 03/12/2020 1025   BUN 14 08/04/2015 0000   BUN 17.5 12/10/2013 0841   CREATININE 0.83 03/12/2020 1025   CREATININE 0.9 12/10/2013 0841   GLU 102 08/04/2015 0000      Component Value Date/Time   CALCIUM 8.9 03/12/2020 1025   CALCIUM 9.6 12/10/2013 0841   ALKPHOS 119 03/12/2020 1025   ALKPHOS 54 12/10/2013 0841   AST 47 (H) 03/12/2020 1025   AST 22 12/10/2013 0841   ALT 42 03/12/2020 1025   ALT 27 12/10/2013 0841   BILITOT 0.4 03/12/2020 1025   BILITOT 0.41 12/10/2013 0841      No results found for: LABCA2  No components found for: OINOMV672  No results for input(s): INR in the last 168 hours.  No results found for: LABCA2  No results found for: CAN199  Lab Results  Component Value Date   CNO709 21.1 11/23/2019    No results found for: GGE366  Lab Results  Component Value Date   CA2729 59.2 (H) 02/13/2020    No components found for: HGQUANT  Lab Results  Component Value Date   CEA1 17.85 (H) 02/13/2020   /  CEA ([facility])  Date Value Ref Range Status  02/13/2020 17.85 (H) 0.00 - 5.00 ng/mL Final    Comment:    (NOTE) This test was performed using Architect's Chemiluminescent Microparticle Immunoassay. Values obtained from different assay methods cannot be used interchangeably. Please note that 5-10% of patients who smoke may see CEA levels up to 6.9 ng/mL. Performed at [facility], [place] [address] [place], [place] [address]      No results found for: AFPTUMOR  No results found for: CHROMOGRNA   No results found for: HGBA, HGBA2QUANT, HGBFQUANT, HGBSQUAN (Hemoglobinopathy evaluation)   Lab Results  Component  Value Date   LDH 193 12/10/2013    No results found for: IRON, TIBC, IRONPCTSAT (Iron and TIBC)  No results found for: FERRITIN  Urinalysis    Component Value Date/Time   COLORURINE YELLOW 09/17/2014 [place] 09/17/2014 0824   LABSPEC 1.015 09/17/2014 0824   PHURINE 7.5 09/17/2014 [place] 09/17/2014 [place] 09/17/2014 [place] 09/17/2014 0824   KETONESUR NEGATIVE 09/17/2014 0824   UROBILINOGEN 0.2 09/17/2014 0824   NITRITE NEGATIVE 09/17/2014 0824   LEUKOCYTESUR NEGATIVE 09/17/2014 0824    STUDIES: No results found.   ELIGIBLE FOR AVAILABLE RESEARCH PROTOCOL: no  ASSESSMENT: 64 y.o. [place] woman status post left lumpectomy 1996 for a T1 N1, anatomic stage II invasive lobular breast cancer, estrogen receptor positive  (a) status post adjuvant chemotherapy with doxorubicin and cyclophosphamide x4  (b) status post adjuvant radiation  (c) status post tamoxifen for 5+ years  (1) benign leukopenia: noted 2011, felt secondary to prior chemotherapy  (2) genetics testing 06/23/2018 through [facility] panel found no deleterious mutations in the 84 genes tested including BRCA 1-2  (a) 2  variants of uncertain significance were fund in [place] [c.1037C>T (p.Ser346Phe) and c.503C>G (p.ALA168Gly)  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 (3) staging studies:  (a) breast MRI 10/30/2019 shows sternal mottling  (b) bone scan 11/19/2019 shows multiple areas of uptake suspicious for metastatic disease  (c) CT of the chest abdomen and pelvis 11/28/2019 shows no visceral disease  (d) tumor markers 11/23/2019 shows a CEA of 18.11, CA 27-29 of 61.2, CA 125 of 21.1  (e) F-18-estradiol PET (cerianna) 01/11/2020 shows multiple sclerotic bone lesions which do not accumulate the estrogen receptor specific radiotracer  (f) bone marrow biopsy 01/25/2020 confirms metastatic breast cancer, gross cystic disease fluid protein  positive, but estrogen and progesterone receptor negative.  HER-2/neu was 2+ on immunohistochemistry negative by FISH (1.35/1.70).  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  PTEN was positive by IHC at 90%.  PD-L1 was negative.  Genomic LOH was low and MSI was stable with proficient mismatch mismatch repair.  BRCA 1 and 2 were negative.  (4) denosumab/Xgeva started 12/13/2019, repeated monthly  (5) metronomic capecitabine started 02/25/2020, with significant plantar dysesthesia developing after 1 week, drug held 03/12/2020  (a) other options include CMF, sacituzumab govitecan, bicalutamide   PLAN: Brittany [name]'s plantar dysesthesia is improving and I expect will be resolved after a few more days.  We are going to give capecitabine a second try at an even lower dose.  She will take 1000 mg in the morning beginning March 25, 2020.  I will check in with her by video conference as we did today on June 10.  If everything is going well we will consider going up on the dose or perhaps waiting another couple of weeks before making a change.  Of course if there is any problem before then she will let me know  She and her husband made it clear that they really want a good quality of life and I agree this medicine will not be right for her if it does not allow her to walk the dog, playing golf, and take hikes which are things she enjoys doing  Chauncey Cruel, MD   03/19/2020 12:47 PM Medical Oncology and Hematology [facility] [place], [place] [address] Tel. [phone]    Fax. [phone]   I, Wilburn Mylar, am acting as scribe for Dr. Virgie Dad. Olivia Pavelko.  I, Lurline Del MD, have reviewed the above documentation for accuracy and completeness, and I agree with the above.   *Total Encounter Time as defined by the Centers for Medicare and Medicaid Services includes, in addition to the  face-to-face time of a patient visit (documented in the note above) non-face-to-face time: obtaining and reviewing outside history, ordering and reviewing medications, tests or procedures, care coordination (communications with other health care professionals or caregivers) and documentation in the medical record.

## 2020-03-19 ENCOUNTER — Telehealth (HOSPITAL_BASED_OUTPATIENT_CLINIC_OR_DEPARTMENT_OTHER): Payer: BC Managed Care – PPO | Admitting: Oncology

## 2020-03-19 DIAGNOSIS — C50112 Malignant neoplasm of central portion of left female breast: Secondary | ICD-10-CM

## 2020-03-19 DIAGNOSIS — Z170 Estrogen receptor positive status [ER+]: Secondary | ICD-10-CM | POA: Diagnosis not present

## 2020-03-19 DIAGNOSIS — C7951 Secondary malignant neoplasm of bone: Secondary | ICD-10-CM | POA: Diagnosis not present

## 2020-04-08 NOTE — Progress Notes (Signed)
[place]  [phone] [phone]     ID: [name] DOB: 1955-12-14  MR#: [id number]  BEM#:[id number]  Patient Care Team: Binnie Rail, MD as PCP - General (Internal Medicine) Aloha Gell, MD as Consulting Physician (Obstetrics and Gynecology) Sydnee Levans, MD (Dermatology) Alekhya Gravlin, Virgie Dad, MD as Consulting Physician (Oncology) Richmond Campbell, MD as Consulting Physician (Gastroenterology) Stefanie Libel, MD as Consulting Physician (Sports Medicine) Chauncey Cruel, MD OTHER MD:   CHIEF COMPLAINT: Stage IV lobular breast cancer  CURRENT TREATMENT: anastrozole; Delton See; capecitabine   INTERVAL HISTORY: Brittany [name] returns today for follow up of her stage IV lobular breast cancer.   She was restarted on capecitabine at an even lower dose, 1000 mg in the morning only, on 03/25/2020.   She began [place] on 12/13/2019.  She has had no side effects from the shots.  She takes a little extra calcium on treatment days to prevent hypocalcemia symptoms.   REVIEW OF SYSTEMS: Brittany [name] and her husband finally retired 03/28/2020.  They immediately went to the mountains and spent a few days hiking, fishing, and reading.  She has noted some peeling of the soles of her feet but it is not swollen erythematous or painful.  Both of them had their vaccines for COVID-19 and tolerated it fine.  She has had no mouth sores, no diarrhea or other changes in bowel habits, and has excellent energy at this point.  Detailed review of systems was otherwise noncontributory   HISTORY OF CURRENT ILLNESS: From the original intake note:  "Brittany" [name] has a history of left breast cancer dating back to 23.  She was followed by Dr. Beryle Beams and underwent lumpectomy, adjuvant chemotherapy, radiation and "hormonal therapy".  Per Brittany's recollection the tumor was less than 2 cm, 2 out of 10 lymph nodes were involved, and it was a lobular breast cancer.  She took tamoxifen for many  years and then raloxifene.  Dr. Beryle Beams also evaluated her for leukopenia which was noted at first in 2011, worked up with a negative ANA and negative rheumatoid factors.  He felt it was a benign residual from her earlier chemotherapy.  She was released from follow-up in 2015.  She has continued on intensified screening because of her family history.  On November 23, 2017 she had bilateral breast MRIs which were unremarkable.  Mammography January 64, 2020 showed no findings suspicious of malignancy.  Breast MRI 66/02/2020 showed a new irregular 0.4 cm enhancing focus in the upper inner quadrant of the right breast.  The left breast continued to be unremarkable except for postoperative changes and there were no abnormal appearing lymph nodes.  However in that same MRI multiple areas of enhancement were noted in the sternum.  This had not been seen in the prior MRI.  Plan right MRI biopsy 66/22/2021 as the previously demonstrated 0.4 cm focus in the right breast showed only a tortuous vein at that location.  However bone scan 11/19/2019 obtained to follow-up on the sternal findings showed additional areas of concern at T12, T7-T11, L4 and ribs, as well as the sternum.  The patient's subsequent history is as detailed below.   PAST MEDICAL HISTORY: Past Medical History:  Diagnosis Date  . ANEMIA-NOS   . Asymptomatic varicose veins   . Breast cancer ([place])   . BREAST CANCER, HX OF 01/1995 dx   s/p Lumpectomy, XRT, chemo and 52yrarmidex  . Gestational diabetes mellitus in childbirth, diet controlled 1985  . Hypothyroid  09/28/2015   Dx 09/2015  . Leukopenia    mild since chemo  . OSTEOARTHRITIS   . Personal history of chemotherapy   . Personal history of radiation therapy   . SCOLIOSIS, MILD     PAST SURGICAL HISTORY: Past Surgical History:  Procedure Laterality Date  . BREAST BIOPSY    . BREAST LUMPECTOMY Left    1996  . left lumpectomy  1996  . TONSILLECTOMY    .  TOTAL HIP ARTHROPLASTY     2002 & 2003-Dr. vail @ [facility]    FAMILY HISTORY Family History  Problem Relation Age of Onset  . Heart disease Father 66       AMI  . Hyperlipidemia Father   . Valvular heart disease Father 73  . Parkinson's disease Father 16  . Arthritis Mother   . Breast cancer Mother 37  . Hyperlipidemia Mother   . Osteoporosis Mother   . Pancreatic cancer Maternal Grandmother 73       presumed dx  . Diabetes Paternal Grandfather        type 2  . Breast cancer Maternal Aunt   The patient's father died at the age of 66 from heart disease.  He had Lewy body dementia.  The patient's mother is 53 years old as of January 2021.  She is in a memory unit.  She has a history of breast cancer diagnosed in her 66s (ductal carcinoma in situ).  The patient's mother sister had breast cancer diagnosed around age 60.  The patient's mother's mother also had cancer, "internal" (possibly ovarian or gastrointestinal).  There is no cancer on the father's side of the family.   GYNECOLOGIC HISTORY:  No LMP recorded. Patient is postmenopausal. Menarche: 64 years old Age at first live birth: 64 years old [place] P 2 LMP with chemotherapy, in 1996 Contraceptive a little over a year, with no complications HRT no  Hysterectomy?  No Salpingo-oophorectomy?  No   SOCIAL HISTORY:  Brittany [name] graduated from [employer] with a degree in psychology and IT.  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  She and her husband Brittany [name] met while working for [employer].  Note they were in [place] at the time of this [place] disaster and probably were exposed to some radiation at that time.  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  They both finally retired 03/28/2020.  Their daughter Brittany [name] is a Pharmacist, hospital in [place].  Their son Brittany [name] works for the same [employer] that the patient works for.  Note that Brittany [name] did the [employer].  The patient has 1  grandchild (in [place]).  The patient attends [employer]: The patient's husband is her healthcare power of attorney   HEALTH MAINTENANCE: Social History   Tobacco Use  . Smoking status: Former Smoker    Quit date: 06/25/1977    Years since quitting: 42.8  . Smokeless tobacco: Never Used  Substance Use Topics  . Alcohol use: Yes    Alcohol/week: 3.0 - 4.0 standard drinks    Types: 3 - 4 Glasses of wine per week  . Drug use: No     Colonoscopy: 2015  PAP: Up-to-date  Bone density: 11/20/2018, normal   No Known Allergies  Current Outpatient Medications  Medication Sig Dispense Refill  . Calcium Citrate 250 MG TABS Take 4 tablets (1,000 mg total) by mouth daily.  0  . capecitabine (XELODA)  500 MG tablet Take 2 tablets (1,000 mg total) by mouth 2 (two) times daily after a meal. 120 tablet 6  . Cholecalciferol (VITAMIN D3) 1000 UNITS CAPS Take by mouth daily.      . diclofenac sodium (VOLTAREN) 1 % GEL Apply 2 g topically 3 (three) times daily as needed. 100 g 1  . levothyroxine (SYNTHROID) 50 MCG tablet TAKE 1 TABLET BY MOUTH EVERY DAY 90 tablet 3  . Multiple Vitamin (MULTIVITAMIN) tablet Take 1 tablet by mouth daily.      . Omega-3 Fatty Acids (FISH OIL) 1000 MG CPDR Take by mouth daily.       No current facility-administered medications for this visit.    OBJECTIVE:  white woman in no acute distress  There were no vitals filed for this visit.   There is no height or weight on file to calculate BMI.   Wt Readings from Last 3 Encounters:  03/12/20 146 lb 8 oz (66.5 kg)  02/13/20 146 lb 3.2 oz (66.3 kg)  01/11/20 146 lb (66.2 kg)      ECOG FS:1 - Symptomatic but completely ambulatory  Sclerae unicteric, EOMs intact Wearing a mask No cervical or supraclavicular adenopathy Lungs no rales or rhonchi Heart regular rate and rhythm Abd soft, nontender, positive bowel sounds MSK no focal spinal tenderness, no upper extremity lymphedema Neuro:  nonfocal, well oriented, appropriate affect Breasts: The right breast is unremarkable.  The left breast is status post remote lumpectomy with some irregularity in the contour laterally.  This is unchanged from baseline.  Both axillae are benign.  Right foot 04/09/2020     LAB RESULTS:  CMP     Component Value Date/Time   NA 134 (L) 03/12/2020 1025   NA 139 12/10/2013 0841   K 5.3 (H) 03/12/2020 1025   K 4.5 12/10/2013 0841   CL 101 03/12/2020 1025   CL 102 12/01/2012 0955   CO2 26 03/12/2020 1025   CO2 28 12/10/2013 0841   GLUCOSE 81 03/12/2020 1025   GLUCOSE 81 12/10/2013 0841   GLUCOSE 88 12/01/2012 0955   BUN 14 03/12/2020 1025   BUN 14 08/04/2015 0000   BUN 17.5 12/10/2013 0841   CREATININE 0.83 03/12/2020 1025   CREATININE 0.9 12/10/2013 0841   CALCIUM 8.9 03/12/2020 1025   CALCIUM 9.6 12/10/2013 0841   PROT 7.1 03/12/2020 1025   PROT 6.7 12/10/2013 0841   ALBUMIN 3.5 03/12/2020 1025   ALBUMIN 3.9 12/10/2013 0841   AST 47 (H) 03/12/2020 1025   AST 22 12/10/2013 0841   ALT 42 03/12/2020 1025   ALT 27 12/10/2013 0841   ALKPHOS 119 03/12/2020 1025   ALKPHOS 54 12/10/2013 0841   BILITOT 0.4 03/12/2020 1025   BILITOT 0.41 12/10/2013 0841   GFRNONAA >60 03/12/2020 1025   GFRAA >60 03/12/2020 1025    No results found for: TOTALPROTELP, ALBUMINELP, A1GS, A2GS, BETS, BETA2SER, GAMS, MSPIKE, SPEI  No results found for: KPAFRELGTCHN, LAMBDASER, KAPLAMBRATIO  Lab Results  Component Value Date   WBC 4.8 04/09/2020   NEUTROABS 2.8 04/09/2020   HGB 11.6 (L) 04/09/2020   HCT 34.6 (L) 04/09/2020   MCV 100.0 04/09/2020   PLT 186 04/09/2020      Chemistry      Component Value Date/Time   NA 134 (L) 03/12/2020 1025   NA 139 12/10/2013 0841   K 5.3 (H) 03/12/2020 1025   K 4.5 12/10/2013 0841   CL 101 03/12/2020 1025   CL 102 12/01/2012 0955  CO2 26 03/12/2020 1025   CO2 28 12/10/2013 0841   BUN 14 03/12/2020 1025   BUN 14 08/04/2015 0000   BUN 17.5  12/10/2013 0841   CREATININE 0.83 03/12/2020 1025   CREATININE 0.9 12/10/2013 0841   GLU 102 08/04/2015 0000      Component Value Date/Time   CALCIUM 8.9 03/12/2020 1025   CALCIUM 9.6 12/10/2013 0841   ALKPHOS 119 03/12/2020 1025   ALKPHOS 54 12/10/2013 0841   AST 47 (H) 03/12/2020 1025   AST 22 12/10/2013 0841   ALT 42 03/12/2020 1025   ALT 27 12/10/2013 0841   BILITOT 0.4 03/12/2020 1025   BILITOT 0.41 12/10/2013 0841      No results found for: LABCA2  No components found for: EHUDJS970  No results for input(s): INR in the last 168 hours.  No results found for: LABCA2  No results found for: CAN199  Lab Results  Component Value Date   YOV785 21.1 11/23/2019    No results found for: YIF027  Lab Results  Component Value Date   CA2729 59.2 (H) 02/13/2020    No components found for: HGQUANT  Lab Results  Component Value Date   CEA1 17.85 (H) 02/13/2020   /  CEA ([facility])  Date Value Ref Range Status  02/13/2020 17.85 (H) 0.00 - 5.00 ng/mL Final    Comment:    (NOTE) This test was performed using Architect's Chemiluminescent Microparticle Immunoassay. Values obtained from different assay methods cannot be used interchangeably. Please note that 5-10% of patients who smoke may see CEA levels up to 6.9 ng/mL. Performed at [facility], [place] [address] [place], [place] [address]      No results found for: AFPTUMOR  No results found for: CHROMOGRNA   No results found for: HGBA, HGBA2QUANT, HGBFQUANT, HGBSQUAN (Hemoglobinopathy evaluation)   Lab Results  Component Value Date   LDH 193 12/10/2013    No results found for: IRON, TIBC, IRONPCTSAT (Iron and TIBC)  No results found for: FERRITIN  Urinalysis    Component Value Date/Time   COLORURINE YELLOW 09/17/2014 [place] 09/17/2014 0824   LABSPEC 1.015 09/17/2014 0824   PHURINE 7.5 09/17/2014 [place] 09/17/2014 [place] 09/17/2014 [place] 09/17/2014 0824   KETONESUR NEGATIVE 09/17/2014 0824   UROBILINOGEN 0.2 09/17/2014 0824   NITRITE NEGATIVE 09/17/2014 0824   LEUKOCYTESUR NEGATIVE 09/17/2014 0824    STUDIES: No results found.   ELIGIBLE FOR AVAILABLE RESEARCH PROTOCOL: no  ASSESSMENT: 64 y.o. [place] woman status post left lumpectomy 1996 for a T1 N1, anatomic stage II invasive lobular breast cancer, estrogen receptor positive  (a) status post adjuvant chemotherapy with doxorubicin and cyclophosphamide x4  (b) status post adjuvant radiation  (c) status post tamoxifen for 5+ years  (1) benign leukopenia: noted 2011, felt secondary to prior chemotherapy  (2) genetics testing 06/23/2018 through [facility] MultiCancer panel found no deleterious mutations in the 84 genes tested including BRCA 1-2  (a) 2 variants of uncertain significance were fund in [place] [c.1037C>T (p.Ser346Phe) and c.503C>G (p.ALA168Gly)  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, 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 (3) staging studies:  (a) breast MRI 10/30/2019 shows sternal mottling  (b) bone scan 11/19/2019 shows multiple areas of uptake suspicious for metastatic disease  (c) CT of the chest abdomen and pelvis 11/28/2019 shows no visceral disease  (d) tumor markers 11/23/2019 shows a CEA of 18.11, CA 27-29 of 61.2, CA 125  of 21.1  (e) F-18-estradiol PET (cerianna) 01/11/2020 shows multiple sclerotic bone lesions which do not accumulate the estrogen receptor specific radiotracer  (f) bone marrow biopsy 01/25/2020 confirms metastatic breast cancer, gross cystic disease fluid protein positive, but estrogen and progesterone receptor negative.  HER-2/neu was 2+ on immunohistochemistry negative by FISH (1.35/1.70).  (g) CARIS confirms triple negative disease, finds a positive androgen receptor at 3+, 70%; and the PIK3 CA was indeterminate.  PTEN was positive by IHC at 90%.  PD-L1 was negative.  Genomic LOH was low and MSI  was stable with proficient mismatch mismatch repair.  BRCA 1 and 2 were negative.  (4) denosumab/Xgeva started 12/13/2019, repeated monthly  (5) metronomic capecitabine started 02/25/2020, with significant plantar dysesthesia developing after 1 week, drug held 03/12/2020  (a) capecitabine restarted 03/25/2020 at 1000 mg in the morning only.  (b) other options include CMF, sacituzumab govitecan, bicalutamide   PLAN: Brittany plantar problems are greatly improved.  She does have some peeling but it is very superficial.  There is no erythema swelling or tenderness and she is walking normally including some hiking.  I think we can continue on the current dose.  I do not think we can increase the dose at least not at this point.  Obviously if the symptoms in the feet get worse she will let me know and we will stop treatment at least for a time.  Otherwise she will receive Xgeva today and again in 4 weeks.  She will return to see me August 4 before she goes for 2-week trip to [place] to take care of her granddaughter while her daughter has no coverage (that trip will be 06/02/2020 through 06/14/2020).  We will consider giving her an Xgeva dose on 0804 or perhaps when she returns.  If everything is perfect when she sees me 0804 we will consider upping the dose to 1500 mg daily of capecitabine.  She knows to call for any other issue that may develop before the next visit.  Total encounter time 30 minutes.Chauncey Cruel, MD   04/09/2020 10:03 AM Medical Oncology and Hematology [facility] [place], [place] [address] Tel. [phone]    Fax. [phone]   I, Wilburn Mylar, am acting as scribe for Dr. Virgie Dad. Jadasia Haws.  I, Lurline Del MD, have reviewed the above documentation for accuracy and completeness, and I agree with the above.   *Total Encounter Time as defined by the Centers for Medicare and Medicaid Services includes, in addition to the  face-to-face time of a patient visit (documented in the note above) non-face-to-face time: obtaining and reviewing outside history, ordering and reviewing medications, tests or procedures, care coordination (communications with other health care professionals or caregivers) and documentation in the medical record.

## 2020-04-09 ENCOUNTER — Inpatient Hospital Stay: Payer: BC Managed Care – PPO | Admitting: Oncology

## 2020-04-09 ENCOUNTER — Inpatient Hospital Stay: Payer: BC Managed Care – PPO | Attending: Oncology

## 2020-04-09 ENCOUNTER — Other Ambulatory Visit: Payer: Self-pay

## 2020-04-09 ENCOUNTER — Inpatient Hospital Stay: Payer: BC Managed Care – PPO

## 2020-04-09 ENCOUNTER — Encounter: Payer: Self-pay | Admitting: Oncology

## 2020-04-09 VITALS — BP 128/72 | HR 61 | Temp 98.5°F | Resp 20 | Ht 68.0 in | Wt 146.3 lb

## 2020-04-09 DIAGNOSIS — Z96643 Presence of artificial hip joint, bilateral: Secondary | ICD-10-CM

## 2020-04-09 DIAGNOSIS — Q6589 Other specified congenital deformities of hip: Secondary | ICD-10-CM

## 2020-04-09 DIAGNOSIS — C7951 Secondary malignant neoplasm of bone: Secondary | ICD-10-CM | POA: Insufficient documentation

## 2020-04-09 DIAGNOSIS — C50912 Malignant neoplasm of unspecified site of left female breast: Secondary | ICD-10-CM | POA: Diagnosis present

## 2020-04-09 DIAGNOSIS — C50112 Malignant neoplasm of central portion of left female breast: Secondary | ICD-10-CM | POA: Diagnosis not present

## 2020-04-09 DIAGNOSIS — M160 Bilateral primary osteoarthritis of hip: Secondary | ICD-10-CM

## 2020-04-09 DIAGNOSIS — H40039 Anatomical narrow angle, unspecified eye: Secondary | ICD-10-CM

## 2020-04-09 DIAGNOSIS — Z170 Estrogen receptor positive status [ER+]: Secondary | ICD-10-CM

## 2020-04-09 DIAGNOSIS — I7300 Raynaud's syndrome without gangrene: Secondary | ICD-10-CM

## 2020-04-09 LAB — CBC WITH DIFFERENTIAL/PLATELET
Abs Immature Granulocytes: 0.01 10*3/uL (ref 0.00–0.07)
Basophils Absolute: 0 10*3/uL (ref 0.0–0.1)
Basophils Relative: 0 %
Eosinophils Absolute: 0.1 10*3/uL (ref 0.0–0.5)
Eosinophils Relative: 1 %
HCT: 34.6 % — ABNORMAL LOW (ref 36.0–46.0)
Hemoglobin: 11.6 g/dL — ABNORMAL LOW (ref 12.0–15.0)
Immature Granulocytes: 0 %
Lymphocytes Relative: 32 %
Lymphs Abs: 1.5 10*3/uL (ref 0.7–4.0)
MCH: 33.5 pg (ref 26.0–34.0)
MCHC: 33.5 g/dL (ref 30.0–36.0)
MCV: 100 fL (ref 80.0–100.0)
Monocytes Absolute: 0.4 10*3/uL (ref 0.1–1.0)
Monocytes Relative: 9 %
Neutro Abs: 2.8 10*3/uL (ref 1.7–7.7)
Neutrophils Relative %: 58 %
Platelets: 186 10*3/uL (ref 150–400)
RBC: 3.46 MIL/uL — ABNORMAL LOW (ref 3.87–5.11)
RDW: 17.5 % — ABNORMAL HIGH (ref 11.5–15.5)
WBC: 4.8 10*3/uL (ref 4.0–10.5)
nRBC: 0 % (ref 0.0–0.2)

## 2020-04-09 LAB — CEA (IN HOUSE-CHCC): CEA (CHCC-In House): 15.08 ng/mL — ABNORMAL HIGH (ref 0.00–5.00)

## 2020-04-09 LAB — COMPREHENSIVE METABOLIC PANEL
ALT: 27 U/L (ref 0–44)
AST: 41 U/L (ref 15–41)
Albumin: 3.6 g/dL (ref 3.5–5.0)
Alkaline Phosphatase: 144 U/L — ABNORMAL HIGH (ref 38–126)
Anion gap: 6 (ref 5–15)
BUN: 10 mg/dL (ref 8–23)
CO2: 26 mmol/L (ref 22–32)
Calcium: 8.5 mg/dL — ABNORMAL LOW (ref 8.9–10.3)
Chloride: 100 mmol/L (ref 98–111)
Creatinine, Ser: 0.85 mg/dL (ref 0.44–1.00)
GFR calc Af Amer: >60 mL/min (ref >60)
GFR calc non Af Amer: >60 mL/min (ref >60)
Glucose, Bld: 90 mg/dL (ref 70–99)
Potassium: 4.6 mmol/L (ref 3.5–5.1)
Sodium: 132 mmol/L — ABNORMAL LOW (ref 135–145)
Total Bilirubin: 0.6 mg/dL (ref 0.3–1.2)
Total Protein: 6.8 g/dL (ref 6.5–8.1)

## 2020-04-09 MED ORDER — DENOSUMAB 120 MG/1.7ML ~~LOC~~ SOLN
SUBCUTANEOUS | Status: AC
  Filled 2020-04-09: qty 1.7

## 2020-04-09 MED ORDER — DENOSUMAB 120 MG/1.7ML ~~LOC~~ SOLN
120.0000 mg | Freq: Once | SUBCUTANEOUS | Status: AC
  Administered 2020-04-09: 120 mg via SUBCUTANEOUS

## 2020-04-09 NOTE — Patient Instructions (Signed)
Denosumab injection What is this medicine? DENOSUMAB (den oh sue mab) slows bone breakdown. Prolia is used to treat osteoporosis in women after menopause and in men, and in people who are taking corticosteroids for 6 months or more. Xgeva is used to treat a high calcium level due to cancer and to prevent bone fractures and other bone problems caused by multiple myeloma or cancer bone metastases. Xgeva is also used to treat giant cell tumor of the bone. This medicine may be used for other purposes; ask your health care provider or pharmacist if you have questions. COMMON BRAND NAME(S): Prolia, XGEVA What should I tell my health care provider before I take this medicine? They need to know if you have any of these conditions:  dental disease  having surgery or tooth extraction  infection  kidney disease  low levels of calcium or Vitamin D in the blood  malnutrition  on hemodialysis  skin conditions or sensitivity  thyroid or parathyroid disease  an unusual reaction to denosumab, other medicines, foods, dyes, or preservatives  pregnant or trying to get pregnant  breast-feeding How should I use this medicine? This medicine is for injection under the skin. It is given by a health care professional in a hospital or clinic setting. A special MedGuide will be given to you before each treatment. Be sure to read this information carefully each time. For Prolia, talk to your pediatrician regarding the use of this medicine in children. Special care may be needed. For Xgeva, talk to your pediatrician regarding the use of this medicine in children. While this drug may be prescribed for children as young as 13 years for selected conditions, precautions do apply. Overdosage: If you think you have taken too much of this medicine contact a poison control center or emergency room at once. NOTE: This medicine is only for you. Do not share this medicine with others. What if I miss a dose? It is  important not to miss your dose. Call your doctor or health care professional if you are unable to keep an appointment. What may interact with this medicine? Do not take this medicine with any of the following medications:  other medicines containing denosumab This medicine may also interact with the following medications:  medicines that lower your chance of fighting infection  steroid medicines like prednisone or cortisone This list may not describe all possible interactions. Give your health care provider a list of all the medicines, herbs, non-prescription drugs, or dietary supplements you use. Also tell them if you smoke, drink alcohol, or use illegal drugs. Some items may interact with your medicine. What should I watch for while using this medicine? Visit your doctor or health care professional for regular checks on your progress. Your doctor or health care professional may order blood tests and other tests to see how you are doing. Call your doctor or health care professional for advice if you get a fever, chills or sore throat, or other symptoms of a cold or flu. Do not treat yourself. This drug may decrease your body's ability to fight infection. Try to avoid being around people who are sick. You should make sure you get enough calcium and vitamin D while you are taking this medicine, unless your doctor tells you not to. Discuss the foods you eat and the vitamins you take with your health care professional. See your dentist regularly. Brush and floss your teeth as directed. Before you have any dental work done, tell your dentist you are   receiving this medicine. Do not become pregnant while taking this medicine or for 5 months after stopping it. Talk with your doctor or health care professional about your birth control options while taking this medicine. Women should inform their doctor if they wish to become pregnant or think they might be pregnant. There is a potential for serious side  effects to an unborn child. Talk to your health care professional or pharmacist for more information. What side effects may I notice from receiving this medicine? Side effects that you should report to your doctor or health care professional as soon as possible:  allergic reactions like skin rash, itching or hives, swelling of the face, lips, or tongue  bone pain  breathing problems  dizziness  jaw pain, especially after dental work  redness, blistering, peeling of the skin  signs and symptoms of infection like fever or chills; cough; sore throat; pain or trouble passing urine  signs of low calcium like fast heartbeat, muscle cramps or muscle pain; pain, tingling, numbness in the hands or feet; seizures  unusual bleeding or bruising  unusually weak or tired Side effects that usually do not require medical attention (report to your doctor or health care professional if they continue or are bothersome):  constipation  diarrhea  headache  joint pain  loss of appetite  muscle pain  runny nose  tiredness  upset stomach This list may not describe all possible side effects. Call your doctor for medical advice about side effects. You may report side effects to FDA at 1-800-FDA-1088. Where should I keep my medicine? This medicine is only given in a clinic, doctor's office, or other health care setting and will not be stored at home. NOTE: This sheet is a summary. It may not cover all possible information. If you have questions about this medicine, talk to your doctor, pharmacist, or health care provider.  2020 Elsevier/Gold Standard (2018-02-17 16:10:44)

## 2020-04-09 NOTE — Progress Notes (Signed)
Per MD ok to give Xgeva with current calcium. Will express calcium issues to patient

## 2020-04-10 LAB — CANCER ANTIGEN 27.29: CA 27.29: 62.9 U/mL — ABNORMAL HIGH (ref 0.0–38.6)

## 2020-04-15 ENCOUNTER — Telehealth: Payer: Self-pay | Admitting: Oncology

## 2020-04-15 NOTE — Telephone Encounter (Signed)
Scheduled appts per 6/16 los. Left voicemail with appt date and time.

## 2020-05-07 ENCOUNTER — Inpatient Hospital Stay: Payer: Medicare Other | Attending: Oncology

## 2020-05-07 ENCOUNTER — Other Ambulatory Visit: Payer: Self-pay

## 2020-05-07 ENCOUNTER — Inpatient Hospital Stay: Payer: Medicare Other

## 2020-05-07 VITALS — BP 123/78 | HR 67 | Temp 98.5°F | Resp 18

## 2020-05-07 DIAGNOSIS — H40039 Anatomical narrow angle, unspecified eye: Secondary | ICD-10-CM

## 2020-05-07 DIAGNOSIS — C50912 Malignant neoplasm of unspecified site of left female breast: Secondary | ICD-10-CM | POA: Insufficient documentation

## 2020-05-07 DIAGNOSIS — I7300 Raynaud's syndrome without gangrene: Secondary | ICD-10-CM

## 2020-05-07 DIAGNOSIS — Z170 Estrogen receptor positive status [ER+]: Secondary | ICD-10-CM | POA: Insufficient documentation

## 2020-05-07 DIAGNOSIS — Q6589 Other specified congenital deformities of hip: Secondary | ICD-10-CM

## 2020-05-07 DIAGNOSIS — M160 Bilateral primary osteoarthritis of hip: Secondary | ICD-10-CM

## 2020-05-07 DIAGNOSIS — C50112 Malignant neoplasm of central portion of left female breast: Secondary | ICD-10-CM

## 2020-05-07 DIAGNOSIS — C7951 Secondary malignant neoplasm of bone: Secondary | ICD-10-CM

## 2020-05-07 DIAGNOSIS — Z96643 Presence of artificial hip joint, bilateral: Secondary | ICD-10-CM

## 2020-05-07 LAB — CBC WITH DIFFERENTIAL/PLATELET
Abs Immature Granulocytes: 0.01 10*3/uL (ref 0.00–0.07)
Basophils Absolute: 0 10*3/uL (ref 0.0–0.1)
Basophils Relative: 1 %
Eosinophils Absolute: 0.1 10*3/uL (ref 0.0–0.5)
Eosinophils Relative: 2 %
HCT: 36.7 % (ref 36.0–46.0)
Hemoglobin: 12.3 g/dL (ref 12.0–15.0)
Immature Granulocytes: 0 %
Lymphocytes Relative: 47 %
Lymphs Abs: 1.7 10*3/uL (ref 0.7–4.0)
MCH: 34.5 pg — ABNORMAL HIGH (ref 26.0–34.0)
MCHC: 33.5 g/dL (ref 30.0–36.0)
MCV: 102.8 fL — ABNORMAL HIGH (ref 80.0–100.0)
Monocytes Absolute: 0.4 10*3/uL (ref 0.1–1.0)
Monocytes Relative: 10 %
Neutro Abs: 1.5 10*3/uL — ABNORMAL LOW (ref 1.7–7.7)
Neutrophils Relative %: 40 %
Platelets: 212 10*3/uL (ref 150–400)
RBC: 3.57 MIL/uL — ABNORMAL LOW (ref 3.87–5.11)
RDW: 17.1 % — ABNORMAL HIGH (ref 11.5–15.5)
WBC: 3.6 10*3/uL — ABNORMAL LOW (ref 4.0–10.5)
nRBC: 0 % (ref 0.0–0.2)

## 2020-05-07 LAB — COMPREHENSIVE METABOLIC PANEL
ALT: 31 U/L (ref 0–44)
AST: 44 U/L — ABNORMAL HIGH (ref 15–41)
Albumin: 3.8 g/dL (ref 3.5–5.0)
Alkaline Phosphatase: 171 U/L — ABNORMAL HIGH (ref 38–126)
Anion gap: 7 (ref 5–15)
BUN: 10 mg/dL (ref 8–23)
CO2: 25 mmol/L (ref 22–32)
Calcium: 9.2 mg/dL (ref 8.9–10.3)
Chloride: 103 mmol/L (ref 98–111)
Creatinine, Ser: 0.83 mg/dL (ref 0.44–1.00)
GFR calc Af Amer: >60 mL/min (ref >60)
GFR calc non Af Amer: >60 mL/min (ref >60)
Glucose, Bld: 73 mg/dL (ref 70–99)
Potassium: 4.7 mmol/L (ref 3.5–5.1)
Sodium: 135 mmol/L (ref 135–145)
Total Bilirubin: 0.5 mg/dL (ref 0.3–1.2)
Total Protein: 7.4 g/dL (ref 6.5–8.1)

## 2020-05-07 LAB — CEA (IN HOUSE-CHCC): CEA (CHCC-In House): 10.85 ng/mL — ABNORMAL HIGH (ref 0.00–5.00)

## 2020-05-07 MED ORDER — DENOSUMAB 120 MG/1.7ML ~~LOC~~ SOLN
SUBCUTANEOUS | Status: AC
  Filled 2020-05-07: qty 1.7

## 2020-05-07 MED ORDER — DENOSUMAB 120 MG/1.7ML ~~LOC~~ SOLN
120.0000 mg | Freq: Once | SUBCUTANEOUS | Status: AC
  Administered 2020-05-07: 120 mg via SUBCUTANEOUS

## 2020-05-07 NOTE — Patient Instructions (Signed)
Denosumab injection What is this medicine? DENOSUMAB (den oh sue mab) slows bone breakdown. Prolia is used to treat osteoporosis in women after menopause and in men, and in people who are taking corticosteroids for 6 months or more. Xgeva is used to treat a high calcium level due to cancer and to prevent bone fractures and other bone problems caused by multiple myeloma or cancer bone metastases. Xgeva is also used to treat giant cell tumor of the bone. This medicine may be used for other purposes; ask your health care provider or pharmacist if you have questions. COMMON BRAND NAME(S): Prolia, XGEVA What should I tell my health care provider before I take this medicine? They need to know if you have any of these conditions:  dental disease  having surgery or tooth extraction  infection  kidney disease  low levels of calcium or Vitamin D in the blood  malnutrition  on hemodialysis  skin conditions or sensitivity  thyroid or parathyroid disease  an unusual reaction to denosumab, other medicines, foods, dyes, or preservatives  pregnant or trying to get pregnant  breast-feeding How should I use this medicine? This medicine is for injection under the skin. It is given by a health care professional in a hospital or clinic setting. A special MedGuide will be given to you before each treatment. Be sure to read this information carefully each time. For Prolia, talk to your pediatrician regarding the use of this medicine in children. Special care may be needed. For Xgeva, talk to your pediatrician regarding the use of this medicine in children. While this drug may be prescribed for children as young as 13 years for selected conditions, precautions do apply. Overdosage: If you think you have taken too much of this medicine contact a poison control center or emergency room at once. NOTE: This medicine is only for you. Do not share this medicine with others. What if I miss a dose? It is  important not to miss your dose. Call your doctor or health care professional if you are unable to keep an appointment. What may interact with this medicine? Do not take this medicine with any of the following medications:  other medicines containing denosumab This medicine may also interact with the following medications:  medicines that lower your chance of fighting infection  steroid medicines like prednisone or cortisone This list may not describe all possible interactions. Give your health care provider a list of all the medicines, herbs, non-prescription drugs, or dietary supplements you use. Also tell them if you smoke, drink alcohol, or use illegal drugs. Some items may interact with your medicine. What should I watch for while using this medicine? Visit your doctor or health care professional for regular checks on your progress. Your doctor or health care professional may order blood tests and other tests to see how you are doing. Call your doctor or health care professional for advice if you get a fever, chills or sore throat, or other symptoms of a cold or flu. Do not treat yourself. This drug may decrease your body's ability to fight infection. Try to avoid being around people who are sick. You should make sure you get enough calcium and vitamin D while you are taking this medicine, unless your doctor tells you not to. Discuss the foods you eat and the vitamins you take with your health care professional. See your dentist regularly. Brush and floss your teeth as directed. Before you have any dental work done, tell your dentist you are   receiving this medicine. Do not become pregnant while taking this medicine or for 5 months after stopping it. Talk with your doctor or health care professional about your birth control options while taking this medicine. Women should inform their doctor if they wish to become pregnant or think they might be pregnant. There is a potential for serious side  effects to an unborn child. Talk to your health care professional or pharmacist for more information. What side effects may I notice from receiving this medicine? Side effects that you should report to your doctor or health care professional as soon as possible:  allergic reactions like skin rash, itching or hives, swelling of the face, lips, or tongue  bone pain  breathing problems  dizziness  jaw pain, especially after dental work  redness, blistering, peeling of the skin  signs and symptoms of infection like fever or chills; cough; sore throat; pain or trouble passing urine  signs of low calcium like fast heartbeat, muscle cramps or muscle pain; pain, tingling, numbness in the hands or feet; seizures  unusual bleeding or bruising  unusually weak or tired Side effects that usually do not require medical attention (report to your doctor or health care professional if they continue or are bothersome):  constipation  diarrhea  headache  joint pain  loss of appetite  muscle pain  runny nose  tiredness  upset stomach This list may not describe all possible side effects. Call your doctor for medical advice about side effects. You may report side effects to FDA at 1-800-FDA-1088. Where should I keep my medicine? This medicine is only given in a clinic, doctor's office, or other health care setting and will not be stored at home. NOTE: This sheet is a summary. It may not cover all possible information. If you have questions about this medicine, talk to your doctor, pharmacist, or health care provider.  2020 Elsevier/Gold Standard (2018-02-17 16:10:44)

## 2020-05-08 LAB — CANCER ANTIGEN 27.29: CA 27.29: 55.7 U/mL — ABNORMAL HIGH (ref 0.0–38.6)

## 2020-05-27 NOTE — Progress Notes (Signed)
[place]  [phone] [phone]     ID: KRISTINIA LEAVY DOB: 1956/01/23  MR#: [id number]  DTO#:[id number]  Patient Care Team: Binnie Rail, MD as PCP - General (Internal Medicine) Aloha Gell, MD as Consulting Physician (Obstetrics and Gynecology) Sydnee Levans, MD (Dermatology) Gisela Lea, Virgie Dad, MD as Consulting Physician (Oncology) Richmond Campbell, MD as Consulting Physician (Gastroenterology) Stefanie Libel, MD as Consulting Physician (Sports Medicine) Chauncey Cruel, MD OTHER MD:   CHIEF COMPLAINT: Stage IV lobular breast cancer  CURRENT TREATMENT: Delton See; capecitabine   INTERVAL HISTORY: Brittany [name] returns today for follow up of her stage IV lobular breast cancer.   She was restarted on capecitabine at an even lower dose, 1000 mg in the morning only, on 03/25/2020.  So far she is tolerating this well.  She has had no palmar plantar erythrodysesthesia symptoms, no diarrhea, and no mouth sores.  She was able to obtain 120 tablets $400 at [facility] but notes that she has changed insurance and she is very concerned about future costs  She began [place] on 12/13/2019.  She has had no side effects from the shots.  She takes a little extra calcium on treatment days to prevent hypocalcemia symptoms.  We are following her tumor markers  Results for MAEKAYLA, GIORGIO "[name]" (MRN [id number]) as of 05/28/2020 10:28  Ref. Range 11/23/2019 15:03 12/13/2019 15:00 02/13/2020 08:56 04/09/2020 09:33 05/07/2020 09:29  CA 27.29 Latest Ref Range: 0.0 - 38.6 U/mL 61.2 (H) 64.1 (H) 59.2 (H) 62.9 (H) 55.7 (H)  Cancer Antigen (CA) 125 Latest Ref Range: 0.0 - 38.1 U/mL 21.1      CEA ([facility]) Latest Ref Range: 0.00 - 5.00 ng/mL 18.11 (H)  17.85 (H) 15.08 (H) 10.85 (H)   REVIEW OF SYSTEMS: Brittany [name] continues to be very active, playing golf, and going swimming.  She just had her 52-1/2-year-old granddaughter visit for a week and she is leaving tomorrow for a 2-week trip to  [place] to be with the other grandchildren.  She has received the Pfizer vaccine and tolerated it well.  A detailed review of systems today was otherwise stable   HISTORY OF CURRENT ILLNESS: From the original intake note:  "[name]" Toppins has a history of left breast cancer dating back to 64.  She was followed by Dr. Beryle Beams and underwent lumpectomy, adjuvant chemotherapy, radiation and "hormonal therapy".  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  She took tamoxifen for many years and then raloxifene.  Dr. Beryle Beams also evaluated her for leukopenia which was noted at first in 2011, worked up with a negative ANA and negative rheumatoid factors.  He felt it was a benign residual from her earlier chemotherapy.  She was released from follow-up in 2015.  She has continued on intensified screening because of her family history.  On November 23, 2017 she had bilateral breast MRIs which were unremarkable.  Mammography November 20, 2018 showed no findings suspicious of malignancy.  Breast MRI 10/30/2019 showed a new irregular 0.4 cm enhancing focus in the upper inner quadrant of the right breast.  The left breast continued to be unremarkable except for postoperative changes and there were no abnormal appearing lymph nodes.  However in that same MRI multiple areas of enhancement were noted in the sternum.  This had not been seen in the prior MRI.  Plan right MRI biopsy 11/16/2019 was canceled as the  previously demonstrated 0.4 cm focus in the right breast showed only a tortuous vein at that location.  However bone scan 11/19/2019 obtained to follow-up on the sternal findings showed additional areas of concern at T12, T7-T11, L4 and ribs, as well as the sternum.  The patient's subsequent history is as detailed below.   PAST MEDICAL HISTORY: Past Medical History:  Diagnosis Date  . ANEMIA-NOS   . Asymptomatic  varicose veins   . Breast cancer ([place])   . BREAST CANCER, HX OF 01/1995 dx   s/p Lumpectomy, XRT, chemo and 47yrarmidex  . Gestational diabetes mellitus in childbirth, diet controlled 1985  . Hypothyroid 09/28/2015   Dx 09/2015  . Leukopenia    mild since chemo  . OSTEOARTHRITIS   . Personal history of chemotherapy   . Personal history of radiation therapy   . SCOLIOSIS, MILD     PAST SURGICAL HISTORY: Past Surgical History:  Procedure Laterality Date  . BREAST BIOPSY    . BREAST LUMPECTOMY Left    1996  . left lumpectomy  1996  . TONSILLECTOMY    . TOTAL HIP ARTHROPLASTY     2002 & 2003-Dr. vail @ [facility]    FAMILY HISTORY Family History  Problem Relation Age of Onset  . Heart disease Father 591      AMI  . Hyperlipidemia Father   . Valvular heart disease Father 776 . Parkinson's disease Father 720 . Arthritis Mother   . Breast cancer Mother 594 . Hyperlipidemia Mother   . Osteoporosis Mother   . Pancreatic cancer Maternal Grandmother 73       presumed dx  . Diabetes Paternal Grandfather        type 2  . Breast cancer Maternal Aunt   The patient's father died at the age of 844from heart disease.  He had Lewy body dementia.  The patient's mother is 819years old as of January 2021.  She is in a memory unit.  She has a history of breast cancer diagnosed in her 58s(ductal carcinoma in situ).  The patient's mother sister had breast cancer diagnosed around age 66  The patient's mother's mother also had cancer, "internal" (possibly ovarian or gastrointestinal).  There is no cancer on the father's side of the family.   GYNECOLOGIC HISTORY:  No LMP recorded. Patient is postmenopausal. Menarche: 64years old Age [place] first live birth[place] 64[place] with chemotherapy, in 1996 Contraceptive a little over a year, with no complications HRT no  Hysterectomy?  No Salpingo-oophorectomy?  No   SOCIAL HISTORY:  DAnahisgraduated from [employer] a degree in psychology and  IT.  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  She and her husband JClair Gullingmet while working for [employer]  [place] exposed to some radiation at that time.  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  They both finally retired 03/28/2020.  Their daughter JAnderson Maltais a hPharmacist, hospitalin [place]  Their son JJenny Reichmannworks for the same wRyland Groupthat the patient works for.  Note that JJenny Reichmanndid the [employer]  The patient has 1 grandchild ([place].  The [facility] The patient's husband is her healthcare power of attorney   HEALTH MAINTENANCE: Social History   Tobacco  Use  . Smoking status: Former Smoker    Quit date: 06/25/1977    Years since quitting: 42.9  . Smokeless tobacco: Never Used  Substance Use Topics  . Alcohol use: Yes    Alcohol/week: 3.0 - 4.0 standard drinks    Types: 3 - 4 Glasses of wine per week  . Drug use: No     Colonoscopy: 2015  PAP: Up-to-date  Bone density: 11/20/2018, normal   No Known Allergies  Current Outpatient Medications  Medication Sig Dispense Refill  . Calcium Citrate 250 MG TABS Take 4 tablets (1,000 mg total) by mouth daily.  0  . capecitabine (XELODA) 500 MG tablet Take 2 tablets (1,000 mg total) by mouth 2 (two) times daily after a meal. 120 tablet 6  . Cholecalciferol (VITAMIN D3) 1000 UNITS CAPS Take by mouth daily.      . diclofenac sodium (VOLTAREN) 1 % GEL Apply 2 g topically 3 (three) times daily as needed. 100 g 1  . levothyroxine (SYNTHROID) 50 MCG tablet TAKE 1 TABLET BY MOUTH EVERY DAY 90 tablet 3  . Multiple Vitamin (MULTIVITAMIN) tablet Take 1 tablet by mouth daily.      . Omega-3 Fatty Acids (FISH OIL) 1000 MG CPDR Take by mouth daily.       No current facility-administered medications for this visit.    OBJECTIVE:  white woman who appears  well  Vitals:   05/28/20 0954  BP: 123/74  Pulse: 63  Resp: 20  Temp: 98.2 F (36.8 C)  SpO2: 100%     Body mass index is 21.82 kg/m.   Wt Readings from Last 3 Encounters:  05/28/20 143 lb 8 oz (65.1 kg)  04/09/20 146 lb 4.8 oz (66.4 kg)  03/12/20 146 lb 8 oz (66.5 kg)      ECOG FS:1 - Symptomatic but completely ambulatory  Sclerae unicteric, EOMs intact Wearing a mask No cervical or supraclavicular adenopathy Lungs no rales or rhonchi Heart regular rate and rhythm Abd soft, nontender, positive bowel sounds MSK no focal spinal tenderness, no upper extremity lymphedema Neuro: nonfocal, well oriented, appropriate affect Breasts: The right breast is unremarkable.  The left breast is status post lumpectomy with no evidence of disease recurrence.  Both axillae are benign.  LAB RESULTS:  CMP     Component Value Date/Time   NA 132 (L) 05/28/2020 0937   NA 139 12/10/2013 0841   K 5.2 (H) 05/28/2020 0937   K 4.5 12/10/2013 0841   CL 100 05/28/2020 0937   CL 102 12/01/2012 0955   CO2 25 05/28/2020 0937   CO2 28 12/10/2013 0841   GLUCOSE 91 05/28/2020 0937   GLUCOSE 81 12/10/2013 0841   GLUCOSE 88 12/01/2012 0955   BUN 12 05/28/2020 0937   BUN 14 08/04/2015 0000   BUN 17.5 12/10/2013 0841   CREATININE 0.85 05/28/2020 0937   CREATININE 0.9 12/10/2013 0841   CALCIUM 9.0 05/28/2020 0937   CALCIUM 9.6 12/10/2013 0841   PROT 7.0 05/28/2020 0937   PROT 6.7 12/10/2013 0841   ALBUMIN 3.8 05/28/2020 0937   ALBUMIN 3.9 12/10/2013 0841   AST 41 05/28/2020 0937   AST 22 12/10/2013 0841   ALT 30 05/28/2020 0937   ALT 27 12/10/2013 0841   ALKPHOS 151 (H) 05/28/2020 0937   ALKPHOS 54 12/10/2013 0841   BILITOT 0.5 05/28/2020 0937   BILITOT 0.41 12/10/2013 0841   GFRNONAA >60 05/28/2020 0937   GFRAA >60 05/28/2020 0937    No results found  for: Ronnald Ramp, A1GS, A2GS, BETS, BETA2SER, GAMS, MSPIKE, SPEI  No results found for: Nils Pyle,  [facility]  Lab Results  Component Value Date   WBC 3.4 (L) 05/28/2020   NEUTROABS 1.3 (L) 05/28/2020   HGB 12.1 05/28/2020   HCT 35.9 (L) 05/28/2020   MCV 103.2 (H) 05/28/2020   PLT 214 05/28/2020      Chemistry      Component Value Date/Time   NA 132 (L) 05/28/2020 0937   NA 139 12/10/2013 0841   K 5.2 (H) 05/28/2020 0937   K 4.5 12/10/2013 0841   CL 100 05/28/2020 0937   CL 102 12/01/2012 0955   CO2 25 05/28/2020 0937   CO2 28 12/10/2013 0841   BUN 12 05/28/2020 0937   BUN 14 08/04/2015 0000   BUN 17.5 12/10/2013 0841   CREATININE 0.85 05/28/2020 0937   CREATININE 0.9 12/10/2013 0841   GLU 102 08/04/2015 0000      Component Value Date/Time   CALCIUM 9.0 05/28/2020 0937   CALCIUM 9.6 12/10/2013 0841   ALKPHOS 151 (H) 05/28/2020 0937   ALKPHOS 54 12/10/2013 0841   AST 41 05/28/2020 0937   AST 22 12/10/2013 0841   ALT 30 05/28/2020 0937   ALT 27 12/10/2013 0841   BILITOT 0.5 05/28/2020 0937   BILITOT 0.41 12/10/2013 0841      No results found for: LABCA2  No components found for: FHQRFX588  No results for input(s): INR in the last 168 hours.  No results found for: LABCA2  No results found for: CAN199  Lab Results  Component Value Date   TGP498 21.1 11/23/2019    No results found for: YME158  Lab Results  Component Value Date   CA2729 55.7 (H) 05/07/2020    No components found for: HGQUANT  Lab Results  Component Value Date   CEA1 10.85 (H) 05/07/2020   /  CEA ([facility])  Date Value Ref Range Status  05/07/2020 10.85 (H) 0.00 - 5.00 ng/mL Final    Comment:    (NOTE) This test was performed using Architect's Chemiluminescent Microparticle Immunoassay. Values obtained from different assay methods cannot be used interchangeably. Please note that 5-10% of patients who smoke may see CEA levels up to 6.9 ng/mL. Performed at [facility], [place] [address], [place], [place] [address]      No results found for:  AFPTUMOR  No results found for: CHROMOGRNA   No results found for: HGBA, HGBA2QUANT, HGBFQUANT, HGBSQUAN (Hemoglobinopathy evaluation)   Lab Results  Component Value Date   LDH 193 12/10/2013    No results found for: IRON, TIBC, IRONPCTSAT (Iron and TIBC)  No results found for: FERRITIN  Urinalysis    Component Value Date/Time   COLORURINE YELLOW 09/17/2014 [place] 09/17/2014 0824   LABSPEC 1.015 09/17/2014 0824   PHURINE 7.5 09/17/2014 [place] 09/17/2014 [place] 09/17/2014 [place] 09/17/2014 0824   KETONESUR NEGATIVE 09/17/2014 0824   UROBILINOGEN 0.2 09/17/2014 0824   NITRITE NEGATIVE 09/17/2014 0824   LEUKOCYTESUR NEGATIVE 09/17/2014 0824    STUDIES: No results found.   ELIGIBLE FOR AVAILABLE RESEARCH PROTOCOL: no  ASSESSMENT: 64 y.o. [place] woman status post left lumpectomy 1996 for a T1 N1, anatomic stage II invasive lobular breast cancer, estrogen receptor positive  (a) status post adjuvant chemotherapy with doxorubicin and cyclophosphamide x4  (b) status post adjuvant radiation  (c) status post tamoxifen for 5+  years  (1) benign leukopenia: noted 2011, felt secondary to prior chemotherapy  (2) genetics testing 06/23/2018 through [facility] MultiCancer panel found no deleterious mutations in the 84 genes tested including BRCA 1-2  (a) 2 variants of uncertain significance were fund in [place] [c.1037C>T (p.Ser346Phe) and c.503C>G (p.ALA168Gly)  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, 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 (3) staging studies:  (a) breast MRI 10/30/2019 shows sternal mottling  (b) bone scan 11/19/2019 shows multiple areas of uptake suspicious for metastatic disease  (c) CT of the chest abdomen and pelvis 11/28/2019 shows no visceral disease  (d) tumor markers 11/23/2019 shows a CEA of 18.11, CA 27-29 of 61.2, CA 125 of 21.1  (e) F-18-estradiol PET (cerianna) 01/11/2020 shows multiple  sclerotic bone lesions which do not accumulate the estrogen receptor specific radiotracer  (f) bone marrow biopsy 01/25/2020 confirms metastatic breast cancer, gross cystic disease fluid protein positive, but estrogen and progesterone receptor negative.  HER-2/neu was 2+ on immunohistochemistry negative by FISH (1.35/1.70).  (g) CARIS confirms triple negative disease, finds a positive androgen receptor at 3+, 70%; and the PIK3CA was indeterminate.  PTEN was positive by IHC at 90%.  PD-L1 was negative.  Genomic LOH was low and MSI was stable with proficient mismatch mismatch repair.  BRCA 1 and 2 were negative.  (4) denosumab/Xgeva started 12/13/2019, repeated monthly  (5) metronomic capecitabine started 02/25/2020, with significant plantar dysesthesia developing after 1 week, drug held 03/12/2020  (a) capecitabine restarted 03/25/2020 at 1000 mg in the morning only.  (b) other options include CMF, sacituzumab govitecan, bicalutamide   PLAN: Brittany [name] is tolerating the current dose of capecitabine well and it appears to be working.  At least the tumor markers have continued to decline.  She has no symptoms related to her tumor or to her treatment at this point.  This is very favorable.  She understands following bone disease only is difficult.  We are going to obtain a repeat bone scan before her October visit here but that may end up being our new baseline since she has received treatment between the earlier 1 and now and denosumab can uncover prior lytic lesions and make them "hot" as healing occurs.  She will receive her next Xgeva dose in September, since she will be out of town for the next 2 weeks when the next dose was due.  She will see me with the October visit  I have given her the information on how to contact our oral pharmacy chemotherapy specialist to see if she can obtain better prices on the oral chemo she is receiving  She knows to call for any other issue that may develop before her  next visit.  Total encounter time 35 minutes.Chauncey Cruel, MD   05/28/2020 10:26 AM Medical Oncology and Hematology [facility] [place], [place] [address] Tel. [phone]    Fax. [phone]   I, Wilburn Mylar, am acting as scribe for Dr. Virgie Dad. Elysa Womac.  I, Lurline Del MD, have reviewed the above documentation for accuracy and completeness, and I agree with the above.   *Total Encounter Time as defined by the Centers for Medicare and Medicaid Services includes, in addition to the face-to-face time of a patient visit (documented in the note above) non-face-to-face time: obtaining and reviewing outside history, ordering and reviewing medications, tests or procedures, care coordination (communications with other health care professionals or caregivers) and documentation in the medical record.

## 2020-05-28 ENCOUNTER — Inpatient Hospital Stay: Payer: Medicare Other

## 2020-05-28 ENCOUNTER — Telehealth: Payer: Self-pay

## 2020-05-28 ENCOUNTER — Other Ambulatory Visit: Payer: Self-pay | Admitting: Pharmacist

## 2020-05-28 ENCOUNTER — Inpatient Hospital Stay (HOSPITAL_BASED_OUTPATIENT_CLINIC_OR_DEPARTMENT_OTHER): Payer: Medicare Other | Admitting: Oncology

## 2020-05-28 ENCOUNTER — Other Ambulatory Visit: Payer: Self-pay

## 2020-05-28 ENCOUNTER — Inpatient Hospital Stay: Payer: Medicare Other | Attending: Oncology

## 2020-05-28 VITALS — BP 123/74 | HR 63 | Temp 98.2°F | Resp 20 | Ht 68.0 in | Wt 143.5 lb

## 2020-05-28 DIAGNOSIS — C50112 Malignant neoplasm of central portion of left female breast: Secondary | ICD-10-CM

## 2020-05-28 DIAGNOSIS — C7951 Secondary malignant neoplasm of bone: Secondary | ICD-10-CM | POA: Diagnosis present

## 2020-05-28 DIAGNOSIS — Z170 Estrogen receptor positive status [ER+]: Secondary | ICD-10-CM | POA: Diagnosis not present

## 2020-05-28 DIAGNOSIS — C50912 Malignant neoplasm of unspecified site of left female breast: Secondary | ICD-10-CM | POA: Insufficient documentation

## 2020-05-28 DIAGNOSIS — I7300 Raynaud's syndrome without gangrene: Secondary | ICD-10-CM

## 2020-05-28 DIAGNOSIS — M160 Bilateral primary osteoarthritis of hip: Secondary | ICD-10-CM

## 2020-05-28 DIAGNOSIS — H40039 Anatomical narrow angle, unspecified eye: Secondary | ICD-10-CM

## 2020-05-28 DIAGNOSIS — Z87891 Personal history of nicotine dependence: Secondary | ICD-10-CM | POA: Diagnosis not present

## 2020-05-28 DIAGNOSIS — Q6589 Other specified congenital deformities of hip: Secondary | ICD-10-CM

## 2020-05-28 DIAGNOSIS — Z96643 Presence of artificial hip joint, bilateral: Secondary | ICD-10-CM

## 2020-05-28 LAB — COMPREHENSIVE METABOLIC PANEL
ALT: 30 U/L (ref 0–44)
AST: 41 U/L (ref 15–41)
Albumin: 3.8 g/dL (ref 3.5–5.0)
Alkaline Phosphatase: 151 U/L — ABNORMAL HIGH (ref 38–126)
Anion gap: 7 (ref 5–15)
BUN: 12 mg/dL (ref 8–23)
CO2: 25 mmol/L (ref 22–32)
Calcium: 9 mg/dL (ref 8.9–10.3)
Chloride: 100 mmol/L (ref 98–111)
Creatinine, Ser: 0.85 mg/dL (ref 0.44–1.00)
GFR calc Af Amer: >60 mL/min (ref >60)
GFR calc non Af Amer: >60 mL/min (ref >60)
Glucose, Bld: 91 mg/dL (ref 70–99)
Potassium: 5.2 mmol/L — ABNORMAL HIGH (ref 3.5–5.1)
Sodium: 132 mmol/L — ABNORMAL LOW (ref 135–145)
Total Bilirubin: 0.5 mg/dL (ref 0.3–1.2)
Total Protein: 7 g/dL (ref 6.5–8.1)

## 2020-05-28 LAB — CBC WITH DIFFERENTIAL/PLATELET
Abs Immature Granulocytes: 0 10*3/uL (ref 0.00–0.07)
Basophils Absolute: 0 10*3/uL (ref 0.0–0.1)
Basophils Relative: 1 %
Eosinophils Absolute: 0.1 10*3/uL (ref 0.0–0.5)
Eosinophils Relative: 2 %
HCT: 35.9 % — ABNORMAL LOW (ref 36.0–46.0)
Hemoglobin: 12.1 g/dL (ref 12.0–15.0)
Immature Granulocytes: 0 %
Lymphocytes Relative: 49 %
Lymphs Abs: 1.6 10*3/uL (ref 0.7–4.0)
MCH: 34.8 pg — ABNORMAL HIGH (ref 26.0–34.0)
MCHC: 33.7 g/dL (ref 30.0–36.0)
MCV: 103.2 fL — ABNORMAL HIGH (ref 80.0–100.0)
Monocytes Absolute: 0.4 10*3/uL (ref 0.1–1.0)
Monocytes Relative: 11 %
Neutro Abs: 1.3 10*3/uL — ABNORMAL LOW (ref 1.7–7.7)
Neutrophils Relative %: 37 %
Platelets: 214 10*3/uL (ref 150–400)
RBC: 3.48 MIL/uL — ABNORMAL LOW (ref 3.87–5.11)
RDW: 15.8 % — ABNORMAL HIGH (ref 11.5–15.5)
WBC: 3.4 10*3/uL — ABNORMAL LOW (ref 4.0–10.5)
nRBC: 0 % (ref 0.0–0.2)

## 2020-05-28 LAB — CEA (IN HOUSE-CHCC): CEA (CHCC-In House): 9.33 ng/mL — ABNORMAL HIGH (ref 0.00–5.00)

## 2020-05-28 MED ORDER — CAPECITABINE 500 MG PO TABS: 1000.0000 mg | ORAL_TABLET | Freq: Two times a day (BID) | ORAL | 6 refills | Status: DC

## 2020-05-28 NOTE — Telephone Encounter (Signed)
PROGRESS NOTE: Per Val RN injection cancelled today

## 2020-05-28 NOTE — Progress Notes (Signed)
Oral Oncology Pharmacist Encounter  Prescription refill for Xeloda (capecitabine) sent to [facility] in error. Patient's insurance requires Rx to be filled at [facility]. Prescription redirected to [facility].  Leron Croak, PharmD, BCPS Hematology/Oncology Clinical Pharmacist [facility] [phone] 05/28/2020 10:44 AM

## 2020-05-29 LAB — CANCER ANTIGEN 27.29: CA 27.29: 58.7 U/mL — ABNORMAL HIGH (ref 0.0–38.6)

## 2020-05-30 ENCOUNTER — Telehealth: Payer: Self-pay | Admitting: Oncology

## 2020-05-30 NOTE — Telephone Encounter (Signed)
Added office visit between appts that were already scheduled per 8/4 los. Made no changes to pt's original arrival time.

## 2020-06-04 ENCOUNTER — Ambulatory Visit: Payer: BC Managed Care – PPO

## 2020-06-04 ENCOUNTER — Other Ambulatory Visit: Payer: BC Managed Care – PPO

## 2020-06-18 ENCOUNTER — Encounter: Payer: Self-pay | Admitting: Oncology

## 2020-07-02 ENCOUNTER — Inpatient Hospital Stay: Payer: Medicare Other | Attending: Oncology

## 2020-07-02 ENCOUNTER — Other Ambulatory Visit: Payer: Self-pay

## 2020-07-02 ENCOUNTER — Inpatient Hospital Stay: Payer: Medicare Other

## 2020-07-02 ENCOUNTER — Other Ambulatory Visit: Payer: BC Managed Care – PPO

## 2020-07-02 ENCOUNTER — Ambulatory Visit: Payer: BC Managed Care – PPO

## 2020-07-02 VITALS — BP 129/79 | HR 55 | Resp 18

## 2020-07-02 DIAGNOSIS — C50912 Malignant neoplasm of unspecified site of left female breast: Secondary | ICD-10-CM | POA: Insufficient documentation

## 2020-07-02 DIAGNOSIS — C50112 Malignant neoplasm of central portion of left female breast: Secondary | ICD-10-CM

## 2020-07-02 DIAGNOSIS — M160 Bilateral primary osteoarthritis of hip: Secondary | ICD-10-CM

## 2020-07-02 DIAGNOSIS — Z170 Estrogen receptor positive status [ER+]: Secondary | ICD-10-CM | POA: Insufficient documentation

## 2020-07-02 DIAGNOSIS — Z96643 Presence of artificial hip joint, bilateral: Secondary | ICD-10-CM

## 2020-07-02 DIAGNOSIS — H40039 Anatomical narrow angle, unspecified eye: Secondary | ICD-10-CM

## 2020-07-02 DIAGNOSIS — I7300 Raynaud's syndrome without gangrene: Secondary | ICD-10-CM

## 2020-07-02 DIAGNOSIS — Q6589 Other specified congenital deformities of hip: Secondary | ICD-10-CM

## 2020-07-02 DIAGNOSIS — C7951 Secondary malignant neoplasm of bone: Secondary | ICD-10-CM

## 2020-07-02 LAB — COMPREHENSIVE METABOLIC PANEL
ALT: 25 U/L (ref 0–44)
AST: 38 U/L (ref 15–41)
Albumin: 3.8 g/dL (ref 3.5–5.0)
Alkaline Phosphatase: 116 U/L (ref 38–126)
Anion gap: 6 (ref 5–15)
BUN: 13 mg/dL (ref 8–23)
CO2: 26 mmol/L (ref 22–32)
Calcium: 9.2 mg/dL (ref 8.9–10.3)
Chloride: 98 mmol/L (ref 98–111)
Creatinine, Ser: 0.84 mg/dL (ref 0.44–1.00)
GFR calc Af Amer: >60 mL/min (ref >60)
GFR calc non Af Amer: >60 mL/min (ref >60)
Glucose, Bld: 93 mg/dL (ref 70–99)
Potassium: 4.8 mmol/L (ref 3.5–5.1)
Sodium: 130 mmol/L — ABNORMAL LOW (ref 135–145)
Total Bilirubin: 0.4 mg/dL (ref 0.3–1.2)
Total Protein: 7.3 g/dL (ref 6.5–8.1)

## 2020-07-02 LAB — CBC WITH DIFFERENTIAL/PLATELET
Abs Immature Granulocytes: 0.02 10*3/uL (ref 0.00–0.07)
Basophils Absolute: 0 10*3/uL (ref 0.0–0.1)
Basophils Relative: 0 %
Eosinophils Absolute: 0 10*3/uL (ref 0.0–0.5)
Eosinophils Relative: 0 %
HCT: 33.8 % — ABNORMAL LOW (ref 36.0–46.0)
Hemoglobin: 11.6 g/dL — ABNORMAL LOW (ref 12.0–15.0)
Immature Granulocytes: 0 %
Lymphocytes Relative: 32 %
Lymphs Abs: 1.6 10*3/uL (ref 0.7–4.0)
MCH: 35.2 pg — ABNORMAL HIGH (ref 26.0–34.0)
MCHC: 34.3 g/dL (ref 30.0–36.0)
MCV: 102.4 fL — ABNORMAL HIGH (ref 80.0–100.0)
Monocytes Absolute: 0.4 10*3/uL (ref 0.1–1.0)
Monocytes Relative: 7 %
Neutro Abs: 2.9 10*3/uL (ref 1.7–7.7)
Neutrophils Relative %: 61 %
Platelets: 198 10*3/uL (ref 150–400)
RBC: 3.3 MIL/uL — ABNORMAL LOW (ref 3.87–5.11)
RDW: 14 % (ref 11.5–15.5)
WBC: 4.9 10*3/uL (ref 4.0–10.5)
nRBC: 0 % (ref 0.0–0.2)

## 2020-07-02 LAB — CEA (IN HOUSE-CHCC): CEA (CHCC-In House): 7.21 ng/mL — ABNORMAL HIGH (ref 0.00–5.00)

## 2020-07-02 MED ORDER — DENOSUMAB 120 MG/1.7ML ~~LOC~~ SOLN
120.0000 mg | Freq: Once | SUBCUTANEOUS | Status: AC
  Administered 2020-07-02: 120 mg via SUBCUTANEOUS

## 2020-07-02 MED ORDER — DENOSUMAB 120 MG/1.7ML ~~LOC~~ SOLN
SUBCUTANEOUS | Status: AC
  Filled 2020-07-02: qty 1.7

## 2020-07-02 NOTE — Patient Instructions (Signed)
Denosumab injection °What is this medicine? °DENOSUMAB (den oh sue mab) slows bone breakdown. Prolia is used to treat osteoporosis in women after menopause and in men, and in people who are taking corticosteroids for 6 months or more. Xgeva is used to treat a high calcium level due to cancer and to prevent bone fractures and other bone problems caused by multiple myeloma or cancer bone metastases. Xgeva is also used to treat giant cell tumor of the bone. °This medicine may be used for other purposes; ask your health care provider or pharmacist if you have questions. °COMMON BRAND NAME(S): Prolia, XGEVA °What should I tell my health care provider before I take this medicine? °They need to know if you have any of these conditions: °· dental disease °· having surgery or tooth extraction °· infection °· kidney disease °· low levels of calcium or Vitamin D in the blood °· malnutrition °· on hemodialysis °· skin conditions or sensitivity °· thyroid or parathyroid disease °· an unusual reaction to denosumab, other medicines, foods, dyes, or preservatives °· pregnant or trying to get pregnant °· breast-feeding °How should I use this medicine? °This medicine is for injection under the skin. It is given by a health care professional in a hospital or clinic setting. °A special MedGuide will be given to you before each treatment. Be sure to read this information carefully each time. °For Prolia, talk to your pediatrician regarding the use of this medicine in children. Special care may be needed. For Xgeva, talk to your pediatrician regarding the use of this medicine in children. While this drug may be prescribed for children as young as 13 years for selected conditions, precautions do apply. °Overdosage: If you think you have taken too much of this medicine contact a poison control center or emergency room at once. °NOTE: This medicine is only for you. Do not share this medicine with others. °What if I miss a dose? °It is  important not to miss your dose. Call your doctor or health care professional if you are unable to keep an appointment. °What may interact with this medicine? °Do not take this medicine with any of the following medications: °· other medicines containing denosumab °This medicine may also interact with the following medications: °· medicines that lower your chance of fighting infection °· steroid medicines like prednisone or cortisone °This list may not describe all possible interactions. Give your health care provider a list of all the medicines, herbs, non-prescription drugs, or dietary supplements you use. Also tell them if you smoke, drink alcohol, or use illegal drugs. Some items may interact with your medicine. °What should I watch for while using this medicine? °Visit your doctor or health care professional for regular checks on your progress. Your doctor or health care professional may order blood tests and other tests to see how you are doing. °Call your doctor or health care professional for advice if you get a fever, chills or sore throat, or other symptoms of a cold or flu. Do not treat yourself. This drug may decrease your body's ability to fight infection. Try to avoid being around people who are sick. °You should make sure you get enough calcium and vitamin D while you are taking this medicine, unless your doctor tells you not to. Discuss the foods you eat and the vitamins you take with your health care professional. °See your dentist regularly. Brush and floss your teeth as directed. Before you have any dental work done, tell your dentist you are   receiving this medicine. Do not become pregnant while taking this medicine or for 5 months after stopping it. Talk with your doctor or health care professional about your birth control options while taking this medicine. Women should inform their doctor if they wish to become pregnant or think they might be pregnant. There is a potential for serious side  effects to an unborn child. Talk to your health care professional or pharmacist for more information. What side effects may I notice from receiving this medicine? Side effects that you should report to your doctor or health care professional as soon as possible:  allergic reactions like skin rash, itching or hives, swelling of the face, lips, or tongue  bone pain  breathing problems  dizziness  jaw pain, especially after dental work  redness, blistering, peeling of the skin  signs and symptoms of infection like fever or chills; cough; sore throat; pain or trouble passing urine  signs of low calcium like fast heartbeat, muscle cramps or muscle pain; pain, tingling, numbness in the hands or feet; seizures  unusual bleeding or bruising  unusually weak or tired Side effects that usually do not require medical attention (report to your doctor or health care professional if they continue or are bothersome):  constipation  diarrhea  headache  joint pain  loss of appetite  muscle pain  runny nose  tiredness  upset stomach This list may not describe all possible side effects. Call your doctor for medical advice about side effects. You may report side effects to FDA at 1-800-FDA-1088. Where should I keep my medicine? This medicine is only given in a clinic, doctor's office, or other health care setting and will not be stored at home. NOTE: This sheet is a summary. It may not cover all possible information. If you have questions about this medicine, talk to your doctor, pharmacist, or health care provider.  2020 Elsevier/Gold Standard (2018-02-17 16:10:44)

## 2020-07-03 LAB — CANCER ANTIGEN 27.29: CA 27.29: 55.7 U/mL — ABNORMAL HIGH (ref 0.0–38.6)

## 2020-07-22 ENCOUNTER — Encounter (HOSPITAL_COMMUNITY)
Admission: RE | Admit: 2020-07-22 | Discharge: 2020-07-22 | Disposition: A | Discharge status: Home / Self Care | Payer: Medicare Other | Source: Ambulatory Visit | Attending: Oncology | Admitting: Oncology

## 2020-07-22 ENCOUNTER — Other Ambulatory Visit: Payer: Self-pay

## 2020-07-22 DIAGNOSIS — Z170 Estrogen receptor positive status [ER+]: Secondary | ICD-10-CM | POA: Insufficient documentation

## 2020-07-22 DIAGNOSIS — C7951 Secondary malignant neoplasm of bone: Secondary | ICD-10-CM | POA: Diagnosis present

## 2020-07-22 DIAGNOSIS — C50112 Malignant neoplasm of central portion of left female breast: Secondary | ICD-10-CM

## 2020-07-22 MED ORDER — TECHNETIUM TC 99M MEDRONATE IV KIT
20.5000 | PACK | Freq: Once | INTRAVENOUS | Status: AC | PRN
  Administered 2020-07-22: 20.5 via INTRAVENOUS

## 2020-07-24 ENCOUNTER — Encounter: Payer: Self-pay | Admitting: Internal Medicine

## 2020-07-29 NOTE — Progress Notes (Signed)
[name]  Telephone:(336) [phone] Fax:(336) [phone]     ID: [name] DOB: 1956-03-03  MR#: [id number]  GGE#:[id number]  Patient Care Team: Binnie Rail, MD as PCP - General (Internal Medicine) Aloha Gell, MD as Consulting Physician (Obstetrics and Gynecology) Sydnee Levans, MD (Dermatology) Gildo Crisco, Virgie Dad, MD as Consulting Physician (Oncology) Richmond Campbell, MD as Consulting Physician (Gastroenterology) Stefanie Libel, MD as Consulting Physician (Sports Medicine) Chauncey Cruel, MD OTHER MD:   CHIEF COMPLAINT: Stage IV lobular breast cancer  CURRENT TREATMENT: Delton See; capecitabine   INTERVAL HISTORY: [name] returns today for follow up of her stage IV lobular breast cancer.   Since her last visit, she underwent restaging bone scan on 07/22/2020 showing stable disease with no new abnormalities.  She was restarted on capecitabine at an even lower dose, 1000 mg in the morning only, on 03/25/2020.  She has no mouth sores or diarrhea.  She has no significant palmar plantar erythrodysesthesia  She began Xgeva on 12/13/2019.  She receives this every 4 weeks, with a dose due today.   She takes a little extra calcium on treatment days to prevent hypocalcemia symptoms.  We are following her tumor markers Lab Results  Component Value Date   CA2729 55.7 (H) 07/02/2020   CA2729 58.7 (H) 05/28/2020   CA2729 55.7 (H) 05/07/2020   CA2729 62.9 (H) 04/09/2020   CA2729 59.2 (H) 02/13/2020   Lab Results  Component Value Date   CAN125 21.1 11/23/2019   Lab Results  Component Value Date   CEA1 7.21 (H) 07/02/2020   CEA1 9.33 (H) 05/28/2020   CEA1 10.85 (H) 05/07/2020   CEA1 15.08 (H) 04/09/2020   CEA1 17.85 (H) 02/13/2020    REVIEW OF SYSTEMS: Brittany and her husband recently drove to [place] and had a very good time there, kayaking and eating lobster.  She is planning a trip to [place] to visit her daughter and granddaughter.  She has good energy.  She  played tennis last week and had a little bit of foot discomfort but that is clearing.  She had no peeling.  She is up-to-date with her dentist.  Detailed review of systems today was otherwise noncontributory   HISTORY OF CURRENT ILLNESS: From the original intake note:  "Brittany" [name] has a history of left breast cancer dating back to 69.  She was followed by Dr. Beryle Beams and underwent lumpectomy, adjuvant chemotherapy, radiation and "hormonal therapy".  Per Brittany's recollection the tumor was less than 2 cm, 2 out of 10 lymph nodes were involved, and it was a lobular breast cancer.  She took tamoxifen for many years and then raloxifene.  Dr. Beryle Beams also evaluated her for leukopenia which was noted at first in 2011, worked up with a negative ANA and negative rheumatoid factors.  He felt it was a benign residual from her earlier chemotherapy.  She was released from follow-up in 2015.  She has continued on intensified screening because of her family history.  On November 23, 2017 she had bilateral breast MRIs which were unremarkable.  Mammography November 20, 2018 showed no findings suspicious of malignancy.  Breast MRI 10/30/2019 showed a new irregular 0.4 cm enhancing focus in the upper inner quadrant of the right breast.  The left breast continued to be unremarkable except for postoperative changes and there were no abnormal appearing lymph nodes.  However in that same MRI multiple areas of enhancement were noted in the sternum.  This had not been seen in the prior  MRI.  Plan right MRI biopsy 11/16/2019 was canceled as the previously demonstrated 0.4 cm focus in the right breast showed only a tortuous vein at that location.  However bone scan 11/19/2019 obtained to follow-up on the sternal findings showed additional areas of concern at T12, T7-T11, L4 and ribs, as well as the sternum.  The patient's subsequent history is as detailed below.   PAST MEDICAL HISTORY: Past Medical History:   Diagnosis Date  . ANEMIA-NOS   . Asymptomatic varicose veins   . Breast cancer ([place])   . BREAST CANCER, HX OF 01/1995 dx   s/p Lumpectomy, XRT, chemo and 54yrarmidex  . Gestational diabetes mellitus in childbirth, diet controlled 1985  . Hypothyroid 09/28/2015   Dx 09/2015  . Leukopenia    mild since chemo  . OSTEOARTHRITIS   . Personal history of chemotherapy   . Personal history of radiation therapy   . SCOLIOSIS, MILD     PAST SURGICAL HISTORY: Past Surgical History:  Procedure Laterality Date  . BREAST BIOPSY    . BREAST LUMPECTOMY Left    1996  . left lumpectomy  1996  . TONSILLECTOMY    . TOTAL HIP ARTHROPLASTY     2002 & 2003-Dr. vail @ [facility]    FAMILY HISTORY Family History  Problem Relation Age of Onset  . Heart disease Father 572      AMI  . Hyperlipidemia Father   . Valvular heart disease Father 767 . Parkinson's disease Father 734 . Arthritis Mother   . Breast cancer Mother 581 . Hyperlipidemia Mother   . Osteoporosis Mother   . Pancreatic cancer Maternal Grandmother 73       presumed dx  . Diabetes Paternal Grandfather        type 2  . Breast cancer Maternal Aunt   The patient's father died at the age of 849from heart disease.  He had Lewy body dementia.  The patient's mother is 895years old as of January 2021.  She is in a memory unit.  She has a history of breast cancer diagnosed in her 512s(ductal carcinoma in situ).  The patient's mother sister had breast cancer diagnosed around age 66  The patient's mother's mother also had cancer, "internal" (possibly ovarian or gastrointestinal).  There is no cancer on the father's side of the family.   GYNECOLOGIC HISTORY:  No LMP recorded. Patient is postmenopausal. Menarche: 64years old Age at first live birth: 64years old GKennerdellP 2 LMP with chemotherapy, in 1996 Contraceptive a little over a year, with no complications HRT no  Hysterectomy?  No Salpingo-oophorectomy?  No   SOCIAL HISTORY:  [place] [employer] [place] and [employer].  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  She and her husband JClair Gullingmet while working for [employer]  [place] exposed to some radiation at that time.  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  They both finally retired 03/28/2020.  Their daughter JAnderson Maltais a hPharmacist, hospitalin [place]  Their son JJenny Reichmannworks for the same wRyland Groupthat the patient works for.  Note that JJenny Reichmanndid the [employer]  The patient [place] 1 grandchild [place]  The patient attends [employer] The patient's husband is her healthcare power  of attorney   HEALTH MAINTENANCE: Social History   Tobacco Use  . Smoking status: Former Smoker    Quit date: 06/25/1977    Years since quitting: 43.1  . Smokeless tobacco: Never Used  Substance Use Topics  . Alcohol use: Yes    Alcohol/week: 3.0 - 4.0 standard drinks    Types: 3 - 4 Glasses of wine per week  . Drug use: No     Colonoscopy: 2015  PAP: Up-to-date  Bone density: 11/20/2018, normal   No Known Allergies  Current Outpatient Medications  Medication Sig Dispense Refill  . Calcium Citrate 250 MG TABS Take 4 tablets (1,000 mg total) by mouth daily.  0  . capecitabine (XELODA) 500 MG tablet Take 2 tablets (1,000 mg total) by mouth 2 (two) times daily after a meal. 120 tablet 6  . Cholecalciferol (VITAMIN D3) 1000 UNITS CAPS Take by mouth daily.      . diclofenac sodium (VOLTAREN) 1 % GEL Apply 2 g topically 3 (three) times daily as needed. 100 g 1  . levothyroxine (SYNTHROID) 50 MCG tablet TAKE 1 TABLET BY MOUTH EVERY DAY 90 tablet 3  . Multiple Vitamin (MULTIVITAMIN) tablet Take 1 tablet by mouth daily.      . Omega-3 Fatty Acids (FISH OIL) 1000 MG CPDR Take by mouth daily.       No current facility-administered medications for this  visit.    OBJECTIVE:  white woman in no acute distress  Vitals:   07/30/20 0947  BP: 118/74  Pulse: 65  Resp: 18  Temp: (!) 97 F (36.1 C)  SpO2: 100%     Body mass index is 22.01 kg/m.   Wt Readings from Last 3 Encounters:  07/30/20 144 lb 11.8 oz (65.7 kg)  05/28/20 143 lb 8 oz (65.1 kg)  04/09/20 146 lb 4.8 oz (66.4 kg)      ECOG FS:1 - Symptomatic but completely ambulatory  Sclerae unicteric, EOMs intact Wearing a mask No cervical or supraclavicular adenopathy Lungs no rales or rhonchi Heart regular rate and rhythm Abd soft, nontender, positive bowel sounds MSK no focal spinal tenderness, no upper extremity lymphedema Neuro: nonfocal, well oriented, appropriate affect Breasts: The right breast is unremarkable.  The left breast is status post lumpectomy.  There is a significant lateral dimpling which affects the contour but this is stable.  Both axillae are benign.   LAB RESULTS:  CMP     Component Value Date/Time   NA 135 07/30/2020 0932   NA 139 12/10/2013 0841   K 5.1 07/30/2020 0932   K 4.5 12/10/2013 0841   CL 102 07/30/2020 0932   CL 102 12/01/2012 0955   CO2 29 07/30/2020 0932   CO2 28 12/10/2013 0841   GLUCOSE 72 07/30/2020 0932   GLUCOSE 81 12/10/2013 0841   GLUCOSE 88 12/01/2012 0955   BUN 10 07/30/2020 0932   BUN 14 08/04/2015 0000   BUN 17.5 12/10/2013 0841   CREATININE 0.85 07/30/2020 0932   CREATININE 0.9 12/10/2013 0841   CALCIUM 9.4 07/30/2020 0932   CALCIUM 9.6 12/10/2013 0841   PROT 7.1 07/30/2020 0932   PROT 6.7 12/10/2013 0841   ALBUMIN 3.7 07/30/2020 0932   ALBUMIN 3.9 12/10/2013 0841   AST 35 07/30/2020 0932   AST 22 12/10/2013 0841   ALT 25 07/30/2020 0932   ALT 27 12/10/2013 0841   ALKPHOS 99 07/30/2020 0932   ALKPHOS 54 12/10/2013 0841   BILITOT 0.5 07/30/2020 0932   BILITOT 0.41  12/10/2013 0841   GFRNONAA >60 07/30/2020 0932   GFRAA >60 07/02/2020 1324    No results found for: Ronnald Ramp, A1GS, A2GS,  BETS, BETA2SER, GAMS, MSPIKE, SPEI  No results found for: Nils Pyle, [facility]  Lab Results  Component Value Date   WBC 3.5 (L) 07/30/2020   NEUTROABS 1.4 (L) 07/30/2020   HGB 11.8 (L) 07/30/2020   HCT 35.2 (L) 07/30/2020   MCV 104.5 (H) 07/30/2020   PLT 200 07/30/2020      Chemistry      Component Value Date/Time   NA 135 07/30/2020 0932   NA 139 12/10/2013 0841   K 5.1 07/30/2020 0932   K 4.5 12/10/2013 0841   CL 102 07/30/2020 0932   CL 102 12/01/2012 0955   CO2 29 07/30/2020 0932   CO2 28 12/10/2013 0841   BUN 10 07/30/2020 0932   BUN 14 08/04/2015 0000   BUN 17.5 12/10/2013 0841   CREATININE 0.85 07/30/2020 0932   CREATININE 0.9 12/10/2013 0841   GLU 102 08/04/2015 0000      Component Value Date/Time   CALCIUM 9.4 07/30/2020 0932   CALCIUM 9.6 12/10/2013 0841   ALKPHOS 99 07/30/2020 0932   ALKPHOS 54 12/10/2013 0841   AST 35 07/30/2020 0932   AST 22 12/10/2013 0841   ALT 25 07/30/2020 0932   ALT 27 12/10/2013 0841   BILITOT 0.5 07/30/2020 0932   BILITOT 0.41 12/10/2013 0841      No results found for: LABCA2  No components found for: YHOOIL579  No results for input(s): INR in the last 168 hours.  No results found for: LABCA2  No results found for: CAN199  Lab Results  Component Value Date   JKQ206 21.1 11/23/2019    No results found for: ORV615  Lab Results  Component Value Date   CA2729 55.7 (H) 07/02/2020    No components found for: HGQUANT  Lab Results  Component Value Date   CEA1 7.21 (H) 07/02/2020   /  CEA ([facility])  Date Value Ref Range Status  07/02/2020 7.21 (H) 0.00 - 5.00 ng/mL Final    Comment:    (NOTE) This test was performed using Architect's Chemiluminescent Microparticle Immunoassay. Values obtained from different assay methods cannot be used interchangeably. Please note that 5-10% of patients who smoke may see CEA levels up to 6.9 ng/mL. Performed at [facility],  [place] [address], [place], [place] [address]      No results found for: AFPTUMOR  No results found for: [place]   No results found for: HGBA, HGBA2QUANT, HGBFQUANT, HGBSQUAN (Hemoglobinopathy evaluation)   Lab Results  Component Value Date   LDH 193 12/10/2013    No results found for: IRON, TIBC, IRONPCTSAT (Iron and TIBC)  No results found for: FERRITIN  Urinalysis    Component Value Date/Time   COLORURINE YELLOW 09/17/2014 [place] 09/17/2014 0824   LABSPEC 1.015 09/17/2014 0824   PHURINE 7.5 09/17/2014 0824   GLUCOSEU NEGATIVE 09/17/2014 0824   HGBUR NEGATIVE 09/17/2014 0824   BILIRUBINUR NEGATIVE 09/17/2014 0824   KETONESUR NEGATIVE 09/17/2014 0824   UROBILINOGEN 0.2 09/17/2014 0824   NITRITE NEGATIVE 09/17/2014 0824   LEUKOCYTESUR NEGATIVE 09/17/2014 0824    STUDIES: NM Bone Scan Whole Body  Result Date: 07/23/2020 CLINICAL DATA:  LEFT breast cancer post lumpectomy, staging EXAM: NUCLEAR MEDICINE WHOLE BODY BONE SCAN TECHNIQUE: Whole body anterior and posterior images were obtained approximately 3 hours after intravenous injection of radiopharmaceutical.  RADIOPHARMACEUTICALS:  20.5 mCi Technetium-63mMDP IV COMPARISON:  11/19/2019 Radiographic correlation: PET-CT 01/11/2020 FINDINGS: BILATERAL hip prostheses. Uptake at shoulders and LEFT wrist, typically degenerative. Uptake identified throughout thoracic/lumbar spine with mottled uptake throughout the posterior and to lesser degree BILATERAL lower anterior ribs, consistent with osseous metastases. Mild uptake at the anterior iliac bones and at distal sternum again seen. Patient with sclerotic foci throughout the spine and ribs as well as pelvis on a prior PET-CT. Degree of uptake within the spine and ribs appears slightly less than on previous exam. No new sites of abnormal osseous tracer accumulation. Expected urinary tract and soft tissue distribution of tracer. IMPRESSION: Stable sites of uptake  throughout the spine, ribs, sternum and pelvis consistent with known osseous metastatic disease. No new scintigraphic abnormalities. Electronically Signed   By: MLavonia DanaM.D.   On: 07/23/2020 11:36     ELIGIBLE FOR AVAILABLE RESEARCH PROTOCOL: no  ASSESSMENT: 64y.o. [place] woman status post left lumpectomy 1996 for a T1 N1, anatomic stage II invasive lobular breast cancer, estrogen receptor positive  (a) s/p adjuvant chemotherapy with doxorubicin and cyclophosphamide x4  (b) status post adjuvant radiation  (c) status post tamoxifen for 5+ years  (1) benign leukopenia: noted 2011, felt secondary to prior chemotherapy  (2) genetics testing 06/23/2018 through [facility] panel found no deleterious mutations in the 84 genes tested including BRCA 1-2  (a) 2 variants of uncertain significance were fund in [place][c.1037C>T (p.Ser346Phe) and c.503C>G (p.ALA168Gly)  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, 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 (3) staging studies:  (a) breast MRI 10/30/2019 shows sternal mottling  (b) bone scan 11/19/2019 shows multiple areas of uptake suspicious for metastatic disease  (c) CT of the chest abdomen and pelvis 11/28/2019 shows no visceral disease  (d) tumor markers 11/23/2019 shows a CEA of 18.11, CA 27-29 of 61.2, CA 125 of 21.1  (e) F-18-estradiol PET (cerianna) 01/11/2020 shows multiple sclerotic bone lesions which do not accumulate the estrogen receptor specific radiotracer  (f) bone marrow biopsy 01/25/2020 confirms metastatic breast cancer, gross cystic disease fluid protein positive, but estrogen and progesterone receptor negative.  HER-2/neu was 2+ on immunohistochemistry negative by FISH (1.35/1.70).  (g) CARIS confirms triple negative disease, finds a positive androgen receptor at 3+, 70%; and the PIK3CA was indeterminate.  PTEN was positive by IHC at 90%.  PD-L1 was negative.  Genomic LOH was low and MSI was stable with proficient mismatch mismatch repair.   BRCA 1 and 2 were negative.  (4) denosumab/Xgeva started 12/13/2019, repeated monthly  (5) metronomic capecitabine started 02/25/2020, with significant plantar dysesthesia developing after 1 week, drug held 03/12/2020  (a) capecitabine restarted 03/25/2020 at 1000 mg in the morning only.  (b) bone scan 07/23/2020 shows no evidence of disease progression   (c) other options include CMF, sacituzumab govitecan, bicalutamide   PLAN: DSuzi Rootsis now 10 months out from definitive diagnosis of metastatic breast cancer.  She has no symptoms related to her disease and she is tolerating her treatment remarkably well.  There is evidence of response both on the recent bone scan and on the tumor markers particularly the CEA.  She is just beginning to show a little bit of peeling of the very tips of her fingers.  I suspect in the next few months she may develop significant palmar plantar erythrodysesthesia and we may need to change therapy but for now we are continuing with the 1000 mg of capecitabine every morning.  We are also continuing with Xgeva.  She is aware of the risk and concern regarding osteonecrosis.  After 1 year we will consider going to every 8 weeks on that medication  She will not have a repeat MRI this year.  She will have a mammogram instead and that order has been entered.  She will see me again in 3 months.  She knows to call for any other issue that may develop before then  Total encounter time 30 minutes.Chauncey Cruel, MD   07/30/2020 10:14 AM Medical Oncology and Hematology [facility],  [address] Tel. [phone]    Fax. [phone]   I, Wilburn Mylar, am acting as scribe for Dr. Virgie Dad. Haiven Nardone.  I, Lurline Del MD, have reviewed the above documentation for accuracy and completeness, and I agree with the above.   *Total Encounter Time as defined by the Centers for Medicare and Medicaid Services includes, in  addition to the face-to-face time of a patient visit (documented in the note above) non-face-to-face time: obtaining and reviewing outside history, ordering and reviewing medications, tests or procedures, care coordination (communications with other health care professionals or caregivers) and documentation in the medical record.

## 2020-07-30 ENCOUNTER — Other Ambulatory Visit: Payer: Self-pay

## 2020-07-30 ENCOUNTER — Inpatient Hospital Stay: Payer: Medicare Other | Attending: Oncology

## 2020-07-30 ENCOUNTER — Inpatient Hospital Stay: Payer: Medicare Other

## 2020-07-30 ENCOUNTER — Other Ambulatory Visit: Payer: Self-pay | Admitting: Pharmacist

## 2020-07-30 ENCOUNTER — Inpatient Hospital Stay (HOSPITAL_BASED_OUTPATIENT_CLINIC_OR_DEPARTMENT_OTHER): Payer: Medicare Other | Admitting: Oncology

## 2020-07-30 VITALS — BP 118/74 | HR 65 | Temp 97.0°F | Resp 18 | Ht 68.0 in | Wt 144.7 lb

## 2020-07-30 DIAGNOSIS — C50912 Malignant neoplasm of unspecified site of left female breast: Secondary | ICD-10-CM | POA: Diagnosis present

## 2020-07-30 DIAGNOSIS — C50112 Malignant neoplasm of central portion of left female breast: Secondary | ICD-10-CM | POA: Diagnosis not present

## 2020-07-30 DIAGNOSIS — C7951 Secondary malignant neoplasm of bone: Secondary | ICD-10-CM

## 2020-07-30 DIAGNOSIS — M1909 Primary osteoarthritis, other specified site: Secondary | ICD-10-CM | POA: Diagnosis not present

## 2020-07-30 DIAGNOSIS — Z170 Estrogen receptor positive status [ER+]: Secondary | ICD-10-CM

## 2020-07-30 DIAGNOSIS — I7300 Raynaud's syndrome without gangrene: Secondary | ICD-10-CM

## 2020-07-30 DIAGNOSIS — H40039 Anatomical narrow angle, unspecified eye: Secondary | ICD-10-CM

## 2020-07-30 DIAGNOSIS — Q6589 Other specified congenital deformities of hip: Secondary | ICD-10-CM

## 2020-07-30 DIAGNOSIS — M160 Bilateral primary osteoarthritis of hip: Secondary | ICD-10-CM

## 2020-07-30 DIAGNOSIS — Z96643 Presence of artificial hip joint, bilateral: Secondary | ICD-10-CM

## 2020-07-30 LAB — CBC WITH DIFFERENTIAL/PLATELET
Abs Immature Granulocytes: 0.01 10*3/uL (ref 0.00–0.07)
Basophils Absolute: 0 10*3/uL (ref 0.0–0.1)
Basophils Relative: 1 %
Eosinophils Absolute: 0 10*3/uL (ref 0.0–0.5)
Eosinophils Relative: 1 %
HCT: 35.2 % — ABNORMAL LOW (ref 36.0–46.0)
Hemoglobin: 11.8 g/dL — ABNORMAL LOW (ref 12.0–15.0)
Immature Granulocytes: 0 %
Lymphocytes Relative: 49 %
Lymphs Abs: 1.7 10*3/uL (ref 0.7–4.0)
MCH: 35 pg — ABNORMAL HIGH (ref 26.0–34.0)
MCHC: 33.5 g/dL (ref 30.0–36.0)
MCV: 104.5 fL — ABNORMAL HIGH (ref 80.0–100.0)
Monocytes Absolute: 0.3 10*3/uL (ref 0.1–1.0)
Monocytes Relative: 9 %
Neutro Abs: 1.4 10*3/uL — ABNORMAL LOW (ref 1.7–7.7)
Neutrophils Relative %: 40 %
Platelets: 200 10*3/uL (ref 150–400)
RBC: 3.37 MIL/uL — ABNORMAL LOW (ref 3.87–5.11)
RDW: 14.4 % (ref 11.5–15.5)
WBC: 3.5 10*3/uL — ABNORMAL LOW (ref 4.0–10.5)
nRBC: 0 % (ref 0.0–0.2)

## 2020-07-30 LAB — COMPREHENSIVE METABOLIC PANEL
ALT: 25 U/L (ref 0–44)
AST: 35 U/L (ref 15–41)
Albumin: 3.7 g/dL (ref 3.5–5.0)
Alkaline Phosphatase: 99 U/L (ref 38–126)
Anion gap: 4 — ABNORMAL LOW (ref 5–15)
BUN: 10 mg/dL (ref 8–23)
CO2: 29 mmol/L (ref 22–32)
Calcium: 9.4 mg/dL (ref 8.9–10.3)
Chloride: 102 mmol/L (ref 98–111)
Creatinine, Ser: 0.85 mg/dL (ref 0.44–1.00)
GFR calc non Af Amer: >60 mL/min (ref >60)
Glucose, Bld: 72 mg/dL (ref 70–99)
Potassium: 5.1 mmol/L (ref 3.5–5.1)
Sodium: 135 mmol/L (ref 135–145)
Total Bilirubin: 0.5 mg/dL (ref 0.3–1.2)
Total Protein: 7.1 g/dL (ref 6.5–8.1)

## 2020-07-30 LAB — CEA (IN HOUSE-CHCC): CEA (CHCC-In House): 6.21 ng/mL — ABNORMAL HIGH (ref 0.00–5.00)

## 2020-07-30 MED ORDER — CAPECITABINE 500 MG PO TABS: 1000.0000 mg | ORAL_TABLET | Freq: Two times a day (BID) | ORAL | 6 refills | Status: DC

## 2020-07-30 MED ORDER — DENOSUMAB 120 MG/1.7ML ~~LOC~~ SOLN
SUBCUTANEOUS | Status: AC
  Filled 2020-07-30: qty 1.7

## 2020-07-30 MED ORDER — DENOSUMAB 120 MG/1.7ML ~~LOC~~ SOLN
120.0000 mg | Freq: Once | SUBCUTANEOUS | Status: AC
  Administered 2020-07-30: 120 mg via SUBCUTANEOUS

## 2020-07-30 NOTE — Patient Instructions (Signed)
Denosumab injection What is this medicine? DENOSUMAB (den oh sue mab) slows bone breakdown. Prolia is used to treat osteoporosis in women after menopause and in men, and in people who are taking corticosteroids for 6 months or more. Xgeva is used to treat a high calcium level due to cancer and to prevent bone fractures and other bone problems caused by multiple myeloma or cancer bone metastases. Xgeva is also used to treat giant cell tumor of the bone. This medicine may be used for other purposes; ask your health care provider or pharmacist if you have questions. COMMON BRAND NAME(S): Prolia, XGEVA What should I tell my health care provider before I take this medicine? They need to know if you have any of these conditions:  dental disease  having surgery or tooth extraction  infection  kidney disease  low levels of calcium or Vitamin D in the blood  malnutrition  on hemodialysis  skin conditions or sensitivity  thyroid or parathyroid disease  an unusual reaction to denosumab, other medicines, foods, dyes, or preservatives  pregnant or trying to get pregnant  breast-feeding How should I use this medicine? This medicine is for injection under the skin. It is given by a health care professional in a hospital or clinic setting. A special MedGuide will be given to you before each treatment. Be sure to read this information carefully each time. For Prolia, talk to your pediatrician regarding the use of this medicine in children. Special care may be needed. For Xgeva, talk to your pediatrician regarding the use of this medicine in children. While this drug may be prescribed for children as young as 13 years for selected conditions, precautions do apply. Overdosage: If you think you have taken too much of this medicine contact a poison control center or emergency room at once. NOTE: This medicine is only for you. Do not share this medicine with others. What if I miss a dose? It is  important not to miss your dose. Call your doctor or health care professional if you are unable to keep an appointment. What may interact with this medicine? Do not take this medicine with any of the following medications:  other medicines containing denosumab This medicine may also interact with the following medications:  medicines that lower your chance of fighting infection  steroid medicines like prednisone or cortisone This list may not describe all possible interactions. Give your health care provider a list of all the medicines, herbs, non-prescription drugs, or dietary supplements you use. Also tell them if you smoke, drink alcohol, or use illegal drugs. Some items may interact with your medicine. What should I watch for while using this medicine? Visit your doctor or health care professional for regular checks on your progress. Your doctor or health care professional may order blood tests and other tests to see how you are doing. Call your doctor or health care professional for advice if you get a fever, chills or sore throat, or other symptoms of a cold or flu. Do not treat yourself. This drug may decrease your body's ability to fight infection. Try to avoid being around people who are sick. You should make sure you get enough calcium and vitamin D while you are taking this medicine, unless your doctor tells you not to. Discuss the foods you eat and the vitamins you take with your health care professional. See your dentist regularly. Brush and floss your teeth as directed. Before you have any dental work done, tell your dentist you are   receiving this medicine. Do not become pregnant while taking this medicine or for 5 months after stopping it. Talk with your doctor or health care professional about your birth control options while taking this medicine. Women should inform their doctor if they wish to become pregnant or think they might be pregnant. There is a potential for serious side  effects to an unborn child. Talk to your health care professional or pharmacist for more information. What side effects may I notice from receiving this medicine? Side effects that you should report to your doctor or health care professional as soon as possible:  allergic reactions like skin rash, itching or hives, swelling of the face, lips, or tongue  bone pain  breathing problems  dizziness  jaw pain, especially after dental work  redness, blistering, peeling of the skin  signs and symptoms of infection like fever or chills; cough; sore throat; pain or trouble passing urine  signs of low calcium like fast heartbeat, muscle cramps or muscle pain; pain, tingling, numbness in the hands or feet; seizures  unusual bleeding or bruising  unusually weak or tired Side effects that usually do not require medical attention (report to your doctor or health care professional if they continue or are bothersome):  constipation  diarrhea  headache  joint pain  loss of appetite  muscle pain  runny nose  tiredness  upset stomach This list may not describe all possible side effects. Call your doctor for medical advice about side effects. You may report side effects to FDA at 1-800-FDA-1088. Where should I keep my medicine? This medicine is only given in a clinic, doctor's office, or other health care setting and will not be stored at home. NOTE: This sheet is a summary. It may not cover all possible information. If you have questions about this medicine, talk to your doctor, pharmacist, or health care provider.  2020 Elsevier/Gold Standard (2018-02-17 16:10:44)

## 2020-07-30 NOTE — Progress Notes (Signed)
Oral Oncology Pharmacist Encounter  Prescription refill for Xeloda (capecitabine) sent to [facility] in error. Patient's insurance requires Rx to be filled at [facility]. Prescription redirected to [facility].  Leron Croak, PharmD, BCPS Hematology/Oncology Clinical Pharmacist [facility] [phone] 07/30/2020 10:31 AM

## 2020-07-31 LAB — CANCER ANTIGEN 27.29: CA 27.29: 46.5 U/mL — ABNORMAL HIGH (ref 0.0–38.6)

## 2020-08-01 ENCOUNTER — Ambulatory Visit: Payer: Medicare Other

## 2020-08-03 ENCOUNTER — Telehealth: Payer: Self-pay | Admitting: Oncology

## 2020-08-03 ENCOUNTER — Encounter: Payer: Self-pay | Admitting: Oncology

## 2020-08-03 NOTE — Telephone Encounter (Signed)
Scheduled per 10/6 los. Pt will receive an updated appt calendar at next appt, per appt notes

## 2020-08-04 ENCOUNTER — Other Ambulatory Visit: Payer: Self-pay | Admitting: Oncology

## 2020-08-04 DIAGNOSIS — C50112 Malignant neoplasm of central portion of left female breast: Secondary | ICD-10-CM

## 2020-08-04 DIAGNOSIS — M1909 Primary osteoarthritis, other specified site: Secondary | ICD-10-CM

## 2020-08-04 DIAGNOSIS — C7951 Secondary malignant neoplasm of bone: Secondary | ICD-10-CM

## 2020-08-27 ENCOUNTER — Other Ambulatory Visit: Payer: BC Managed Care – PPO

## 2020-08-27 ENCOUNTER — Other Ambulatory Visit: Payer: Self-pay

## 2020-08-27 ENCOUNTER — Inpatient Hospital Stay: Payer: Medicare Other

## 2020-08-27 ENCOUNTER — Inpatient Hospital Stay: Payer: Medicare Other | Attending: Oncology

## 2020-08-27 ENCOUNTER — Ambulatory Visit: Payer: BC Managed Care – PPO

## 2020-08-27 VITALS — BP 135/76 | HR 69 | Temp 98.5°F | Resp 17

## 2020-08-27 DIAGNOSIS — C50912 Malignant neoplasm of unspecified site of left female breast: Secondary | ICD-10-CM | POA: Insufficient documentation

## 2020-08-27 DIAGNOSIS — C50112 Malignant neoplasm of central portion of left female breast: Secondary | ICD-10-CM

## 2020-08-27 DIAGNOSIS — Z96643 Presence of artificial hip joint, bilateral: Secondary | ICD-10-CM

## 2020-08-27 DIAGNOSIS — Q6589 Other specified congenital deformities of hip: Secondary | ICD-10-CM

## 2020-08-27 DIAGNOSIS — I7300 Raynaud's syndrome without gangrene: Secondary | ICD-10-CM

## 2020-08-27 DIAGNOSIS — C7951 Secondary malignant neoplasm of bone: Secondary | ICD-10-CM | POA: Diagnosis present

## 2020-08-27 DIAGNOSIS — M160 Bilateral primary osteoarthritis of hip: Secondary | ICD-10-CM

## 2020-08-27 DIAGNOSIS — Z170 Estrogen receptor positive status [ER+]: Secondary | ICD-10-CM | POA: Diagnosis not present

## 2020-08-27 DIAGNOSIS — H40039 Anatomical narrow angle, unspecified eye: Secondary | ICD-10-CM

## 2020-08-27 LAB — CBC WITH DIFFERENTIAL/PLATELET
Abs Immature Granulocytes: 0.01 10*3/uL (ref 0.00–0.07)
Basophils Absolute: 0 10*3/uL (ref 0.0–0.1)
Basophils Relative: 1 %
Eosinophils Absolute: 0.1 10*3/uL (ref 0.0–0.5)
Eosinophils Relative: 1 %
HCT: 34.8 % — ABNORMAL LOW (ref 36.0–46.0)
Hemoglobin: 11.8 g/dL — ABNORMAL LOW (ref 12.0–15.0)
Immature Granulocytes: 0 %
Lymphocytes Relative: 41 %
Lymphs Abs: 1.7 10*3/uL (ref 0.7–4.0)
MCH: 35.1 pg — ABNORMAL HIGH (ref 26.0–34.0)
MCHC: 33.9 g/dL (ref 30.0–36.0)
MCV: 103.6 fL — ABNORMAL HIGH (ref 80.0–100.0)
Monocytes Absolute: 0.4 10*3/uL (ref 0.1–1.0)
Monocytes Relative: 9 %
Neutro Abs: 2.1 10*3/uL (ref 1.7–7.7)
Neutrophils Relative %: 48 %
Platelets: 219 10*3/uL (ref 150–400)
RBC: 3.36 MIL/uL — ABNORMAL LOW (ref 3.87–5.11)
RDW: 14.1 % (ref 11.5–15.5)
WBC: 4.2 10*3/uL (ref 4.0–10.5)
nRBC: 0 % (ref 0.0–0.2)

## 2020-08-27 LAB — COMPREHENSIVE METABOLIC PANEL
ALT: 28 U/L (ref 0–44)
AST: 43 U/L — ABNORMAL HIGH (ref 15–41)
Albumin: 3.9 g/dL (ref 3.5–5.0)
Alkaline Phosphatase: 91 U/L (ref 38–126)
Anion gap: 7 (ref 5–15)
BUN: 13 mg/dL (ref 8–23)
CO2: 26 mmol/L (ref 22–32)
Calcium: 9 mg/dL (ref 8.9–10.3)
Chloride: 103 mmol/L (ref 98–111)
Creatinine, Ser: 0.89 mg/dL (ref 0.44–1.00)
GFR, Estimated: >60 mL/min (ref >60)
Glucose, Bld: 85 mg/dL (ref 70–99)
Potassium: 4.5 mmol/L (ref 3.5–5.1)
Sodium: 136 mmol/L (ref 135–145)
Total Bilirubin: 0.4 mg/dL (ref 0.3–1.2)
Total Protein: 7.3 g/dL (ref 6.5–8.1)

## 2020-08-27 LAB — CEA (IN HOUSE-CHCC): CEA (CHCC-In House): 5.33 ng/mL — ABNORMAL HIGH (ref 0.00–5.00)

## 2020-08-27 MED ORDER — DENOSUMAB 120 MG/1.7ML ~~LOC~~ SOLN
120.0000 mg | Freq: Once | SUBCUTANEOUS | Status: AC
  Administered 2020-08-27: 120 mg via SUBCUTANEOUS

## 2020-08-27 NOTE — Patient Instructions (Signed)
Denosumab injection What is this medicine? DENOSUMAB (den oh sue mab) slows bone breakdown. Prolia is used to treat osteoporosis in women after menopause and in men, and in people who are taking corticosteroids for 6 months or more. Xgeva is used to treat a high calcium level due to cancer and to prevent bone fractures and other bone problems caused by multiple myeloma or cancer bone metastases. Xgeva is also used to treat giant cell tumor of the bone. This medicine may be used for other purposes; ask your health care provider or pharmacist if you have questions. COMMON BRAND NAME(S): Prolia, XGEVA What should I tell my health care provider before I take this medicine? They need to know if you have any of these conditions:  dental disease  having surgery or tooth extraction  infection  kidney disease  low levels of calcium or Vitamin D in the blood  malnutrition  on hemodialysis  skin conditions or sensitivity  thyroid or parathyroid disease  an unusual reaction to denosumab, other medicines, foods, dyes, or preservatives  pregnant or trying to get pregnant  breast-feeding How should I use this medicine? This medicine is for injection under the skin. It is given by a health care professional in a hospital or clinic setting. A special MedGuide will be given to you before each treatment. Be sure to read this information carefully each time. For Prolia, talk to your pediatrician regarding the use of this medicine in children. Special care may be needed. For Xgeva, talk to your pediatrician regarding the use of this medicine in children. While this drug may be prescribed for children as young as 13 years for selected conditions, precautions do apply. Overdosage: If you think you have taken too much of this medicine contact a poison control center or emergency room at once. NOTE: This medicine is only for you. Do not share this medicine with others. What if I miss a dose? It is  important not to miss your dose. Call your doctor or health care professional if you are unable to keep an appointment. What may interact with this medicine? Do not take this medicine with any of the following medications:  other medicines containing denosumab This medicine may also interact with the following medications:  medicines that lower your chance of fighting infection  steroid medicines like prednisone or cortisone This list may not describe all possible interactions. Give your health care provider a list of all the medicines, herbs, non-prescription drugs, or dietary supplements you use. Also tell them if you smoke, drink alcohol, or use illegal drugs. Some items may interact with your medicine. What should I watch for while using this medicine? Visit your doctor or health care professional for regular checks on your progress. Your doctor or health care professional may order blood tests and other tests to see how you are doing. Call your doctor or health care professional for advice if you get a fever, chills or sore throat, or other symptoms of a cold or flu. Do not treat yourself. This drug may decrease your body's ability to fight infection. Try to avoid being around people who are sick. You should make sure you get enough calcium and vitamin D while you are taking this medicine, unless your doctor tells you not to. Discuss the foods you eat and the vitamins you take with your health care professional. See your dentist regularly. Brush and floss your teeth as directed. Before you have any dental work done, tell your dentist you are   receiving this medicine. Do not become pregnant while taking this medicine or for 5 months after stopping it. Talk with your doctor or health care professional about your birth control options while taking this medicine. Women should inform their doctor if they wish to become pregnant or think they might be pregnant. There is a potential for serious side  effects to an unborn child. Talk to your health care professional or pharmacist for more information. What side effects may I notice from receiving this medicine? Side effects that you should report to your doctor or health care professional as soon as possible:  allergic reactions like skin rash, itching or hives, swelling of the face, lips, or tongue  bone pain  breathing problems  dizziness  jaw pain, especially after dental work  redness, blistering, peeling of the skin  signs and symptoms of infection like fever or chills; cough; sore throat; pain or trouble passing urine  signs of low calcium like fast heartbeat, muscle cramps or muscle pain; pain, tingling, numbness in the hands or feet; seizures  unusual bleeding or bruising  unusually weak or tired Side effects that usually do not require medical attention (report to your doctor or health care professional if they continue or are bothersome):  constipation  diarrhea  headache  joint pain  loss of appetite  muscle pain  runny nose  tiredness  upset stomach This list may not describe all possible side effects. Call your doctor for medical advice about side effects. You may report side effects to FDA at 1-800-FDA-1088. Where should I keep my medicine? This medicine is only given in a clinic, doctor's office, or other health care setting and will not be stored at home. NOTE: This sheet is a summary. It may not cover all possible information. If you have questions about this medicine, talk to your doctor, pharmacist, or health care provider.  2020 Elsevier/Gold Standard (2018-02-17 16:10:44)

## 2020-08-28 LAB — CANCER ANTIGEN 27.29: CA 27.29: 40.5 U/mL — ABNORMAL HIGH (ref 0.0–38.6)

## 2020-09-24 ENCOUNTER — Ambulatory Visit: Payer: BC Managed Care – PPO

## 2020-09-24 ENCOUNTER — Other Ambulatory Visit: Payer: Self-pay

## 2020-09-24 ENCOUNTER — Inpatient Hospital Stay: Payer: Medicare Other

## 2020-09-24 ENCOUNTER — Other Ambulatory Visit: Payer: BC Managed Care – PPO

## 2020-09-24 ENCOUNTER — Inpatient Hospital Stay: Payer: Medicare Other | Attending: Oncology

## 2020-09-24 VITALS — BP 128/79 | HR 72 | Temp 98.7°F | Resp 18

## 2020-09-24 DIAGNOSIS — M160 Bilateral primary osteoarthritis of hip: Secondary | ICD-10-CM

## 2020-09-24 DIAGNOSIS — C50912 Malignant neoplasm of unspecified site of left female breast: Secondary | ICD-10-CM | POA: Diagnosis present

## 2020-09-24 DIAGNOSIS — C7951 Secondary malignant neoplasm of bone: Secondary | ICD-10-CM

## 2020-09-24 DIAGNOSIS — Z170 Estrogen receptor positive status [ER+]: Secondary | ICD-10-CM

## 2020-09-24 DIAGNOSIS — I7300 Raynaud's syndrome without gangrene: Secondary | ICD-10-CM

## 2020-09-24 DIAGNOSIS — Z96643 Presence of artificial hip joint, bilateral: Secondary | ICD-10-CM

## 2020-09-24 DIAGNOSIS — C50112 Malignant neoplasm of central portion of left female breast: Secondary | ICD-10-CM

## 2020-09-24 DIAGNOSIS — H40039 Anatomical narrow angle, unspecified eye: Secondary | ICD-10-CM

## 2020-09-24 DIAGNOSIS — Q6589 Other specified congenital deformities of hip: Secondary | ICD-10-CM

## 2020-09-24 LAB — COMPREHENSIVE METABOLIC PANEL
ALT: 24 U/L (ref 0–44)
AST: 38 U/L (ref 15–41)
Albumin: 4 g/dL (ref 3.5–5.0)
Alkaline Phosphatase: 78 U/L (ref 38–126)
Anion gap: 9 (ref 5–15)
BUN: 13 mg/dL (ref 8–23)
CO2: 23 mmol/L (ref 22–32)
Calcium: 9.7 mg/dL (ref 8.9–10.3)
Chloride: 101 mmol/L (ref 98–111)
Creatinine, Ser: 0.91 mg/dL (ref 0.44–1.00)
GFR, Estimated: >60 mL/min (ref >60)
Glucose, Bld: 87 mg/dL (ref 70–99)
Potassium: 5.3 mmol/L — ABNORMAL HIGH (ref 3.5–5.1)
Sodium: 133 mmol/L — ABNORMAL LOW (ref 135–145)
Total Bilirubin: 0.3 mg/dL (ref 0.3–1.2)
Total Protein: 7.6 g/dL (ref 6.5–8.1)

## 2020-09-24 LAB — CBC WITH DIFFERENTIAL/PLATELET
Abs Immature Granulocytes: 0.02 10*3/uL (ref 0.00–0.07)
Basophils Absolute: 0 10*3/uL (ref 0.0–0.1)
Basophils Relative: 1 %
Eosinophils Absolute: 0 10*3/uL (ref 0.0–0.5)
Eosinophils Relative: 1 %
HCT: 34.3 % — ABNORMAL LOW (ref 36.0–46.0)
Hemoglobin: 11.7 g/dL — ABNORMAL LOW (ref 12.0–15.0)
Immature Granulocytes: 0 %
Lymphocytes Relative: 26 %
Lymphs Abs: 1.6 10*3/uL (ref 0.7–4.0)
MCH: 35.8 pg — ABNORMAL HIGH (ref 26.0–34.0)
MCHC: 34.1 g/dL (ref 30.0–36.0)
MCV: 104.9 fL — ABNORMAL HIGH (ref 80.0–100.0)
Monocytes Absolute: 0.5 10*3/uL (ref 0.1–1.0)
Monocytes Relative: 7 %
Neutro Abs: 4 10*3/uL (ref 1.7–7.7)
Neutrophils Relative %: 65 %
Platelets: 239 10*3/uL (ref 150–400)
RBC: 3.27 MIL/uL — ABNORMAL LOW (ref 3.87–5.11)
RDW: 14.1 % (ref 11.5–15.5)
WBC: 6.1 10*3/uL (ref 4.0–10.5)
nRBC: 0 % (ref 0.0–0.2)

## 2020-09-24 LAB — CEA (IN HOUSE-CHCC): CEA (CHCC-In House): 5.74 ng/mL — ABNORMAL HIGH (ref 0.00–5.00)

## 2020-09-24 MED ORDER — DENOSUMAB 120 MG/1.7ML ~~LOC~~ SOLN
SUBCUTANEOUS | Status: AC
  Filled 2020-09-24: qty 1.7

## 2020-09-24 MED ORDER — DENOSUMAB 120 MG/1.7ML ~~LOC~~ SOLN
120.0000 mg | Freq: Once | SUBCUTANEOUS | Status: AC
  Administered 2020-09-24: 120 mg via SUBCUTANEOUS

## 2020-09-24 NOTE — Patient Instructions (Signed)
Denosumab injection What is this medicine? DENOSUMAB (den oh sue mab) slows bone breakdown. Prolia is used to treat osteoporosis in women after menopause and in men, and in people who are taking corticosteroids for 6 months or more. Xgeva is used to treat a high calcium level due to cancer and to prevent bone fractures and other bone problems caused by multiple myeloma or cancer bone metastases. Xgeva is also used to treat giant cell tumor of the bone. This medicine may be used for other purposes; ask your health care provider or pharmacist if you have questions. COMMON BRAND NAME(S): Prolia, XGEVA What should I tell my health care provider before I take this medicine? They need to know if you have any of these conditions:  dental disease  having surgery or tooth extraction  infection  kidney disease  low levels of calcium or Vitamin D in the blood  malnutrition  on hemodialysis  skin conditions or sensitivity  thyroid or parathyroid disease  an unusual reaction to denosumab, other medicines, foods, dyes, or preservatives  pregnant or trying to get pregnant  breast-feeding How should I use this medicine? This medicine is for injection under the skin. It is given by a health care professional in a hospital or clinic setting. A special MedGuide will be given to you before each treatment. Be sure to read this information carefully each time. For Prolia, talk to your pediatrician regarding the use of this medicine in children. Special care may be needed. For Xgeva, talk to your pediatrician regarding the use of this medicine in children. While this drug may be prescribed for children as young as 13 years for selected conditions, precautions do apply. Overdosage: If you think you have taken too much of this medicine contact a poison control center or emergency room at once. NOTE: This medicine is only for you. Do not share this medicine with others. What if I miss a dose? It is  important not to miss your dose. Call your doctor or health care professional if you are unable to keep an appointment. What may interact with this medicine? Do not take this medicine with any of the following medications:  other medicines containing denosumab This medicine may also interact with the following medications:  medicines that lower your chance of fighting infection  steroid medicines like prednisone or cortisone This list may not describe all possible interactions. Give your health care provider a list of all the medicines, herbs, non-prescription drugs, or dietary supplements you use. Also tell them if you smoke, drink alcohol, or use illegal drugs. Some items may interact with your medicine. What should I watch for while using this medicine? Visit your doctor or health care professional for regular checks on your progress. Your doctor or health care professional may order blood tests and other tests to see how you are doing. Call your doctor or health care professional for advice if you get a fever, chills or sore throat, or other symptoms of a cold or flu. Do not treat yourself. This drug may decrease your body's ability to fight infection. Try to avoid being around people who are sick. You should make sure you get enough calcium and vitamin D while you are taking this medicine, unless your doctor tells you not to. Discuss the foods you eat and the vitamins you take with your health care professional. See your dentist regularly. Brush and floss your teeth as directed. Before you have any dental work done, tell your dentist you are   receiving this medicine. Do not become pregnant while taking this medicine or for 5 months after stopping it. Talk with your doctor or health care professional about your birth control options while taking this medicine. Women should inform their doctor if they wish to become pregnant or think they might be pregnant. There is a potential for serious side  effects to an unborn child. Talk to your health care professional or pharmacist for more information. What side effects may I notice from receiving this medicine? Side effects that you should report to your doctor or health care professional as soon as possible:  allergic reactions like skin rash, itching or hives, swelling of the face, lips, or tongue  bone pain  breathing problems  dizziness  jaw pain, especially after dental work  redness, blistering, peeling of the skin  signs and symptoms of infection like fever or chills; cough; sore throat; pain or trouble passing urine  signs of low calcium like fast heartbeat, muscle cramps or muscle pain; pain, tingling, numbness in the hands or feet; seizures  unusual bleeding or bruising  unusually weak or tired Side effects that usually do not require medical attention (report to your doctor or health care professional if they continue or are bothersome):  constipation  diarrhea  headache  joint pain  loss of appetite  muscle pain  runny nose  tiredness  upset stomach This list may not describe all possible side effects. Call your doctor for medical advice about side effects. You may report side effects to FDA at 1-800-FDA-1088. Where should I keep my medicine? This medicine is only given in a clinic, doctor's office, or other health care setting and will not be stored at home. NOTE: This sheet is a summary. It may not cover all possible information. If you have questions about this medicine, talk to your doctor, pharmacist, or health care provider.  2020 Elsevier/Gold Standard (2018-02-17 16:10:44)

## 2020-09-25 LAB — CANCER ANTIGEN 27.29: CA 27.29: 38.4 U/mL (ref 0.0–38.6)

## 2020-10-06 ENCOUNTER — Encounter: Payer: Self-pay | Admitting: Internal Medicine

## 2020-10-06 NOTE — Patient Instructions (Addendum)
No immunization administered today.   Medications changes include :   Prednisone 40 mg daily for 5 days.  Take with breakfast.  Take gabapentin 200- 300 mg at bedtime.    Your prescription(s) have been submitted to your pharmacy.     Please followup in 1 year    Health Maintenance, Female Adopting a healthy lifestyle and getting preventive care are important in promoting health and wellness. Ask your health care provider about:  The right schedule for you to have regular tests and exams.  Things you can do on your own to prevent diseases and keep yourself healthy. What should I know about diet, weight, and exercise? Eat a healthy diet   Eat a diet that includes plenty of vegetables, fruits, low-fat dairy products, and lean protein.  Do not eat a lot of foods that are high in solid fats, added sugars, or sodium. Maintain a healthy weight Body mass index (BMI) is used to identify weight problems. It estimates body fat based on height and weight. Your health care provider can help determine your BMI and help you achieve or maintain a healthy weight. Get regular exercise Get regular exercise. This is one of the most important things you can do for your health. Most adults should:  Exercise for at least 150 minutes each week. The exercise should increase your heart rate and make you sweat (moderate-intensity exercise).  Do strengthening exercises at least twice a week. This is in addition to the moderate-intensity exercise.  Spend less time sitting. Even light physical activity can be beneficial. Watch cholesterol and blood lipids Have your blood tested for lipids and cholesterol at 66 years of age, then have this test every 5 years. Have your cholesterol levels checked more often if:  Your lipid or cholesterol levels are high.  You are older than 66 years of age.  You are at high risk for heart disease. What should I know about cancer screening? Depending on your health  history and family history, you may need to have cancer screening at various ages. This may include screening for:  Breast cancer.  Cervical cancer.  Colorectal cancer.  Skin cancer.  Lung cancer. What should I know about heart disease, diabetes, and high blood pressure? Blood pressure and heart disease  High blood pressure causes heart disease and increases the risk of stroke. This is more likely to develop in people who have high blood pressure readings, are of African descent, or are overweight.  Have your blood pressure checked: ? Every 3-5 years if you are 65-55 years of age. ? Every year if you are 43 years old or older. Diabetes Have regular diabetes screenings. This checks your fasting blood sugar level. Have the screening done:  Once every three years after age 9 if you are at a normal weight and have a low risk for diabetes.  More often and at a younger age if you are overweight or have a high risk for diabetes. What should I know about preventing infection? Hepatitis B If you have a higher risk for hepatitis B, you should be screened for this virus. Talk with your health care provider to find out if you are at risk for hepatitis B infection. Hepatitis C Testing is recommended for:  Everyone born from [birth year] through 1967.  Anyone with known risk factors for hepatitis C. Sexually transmitted infections (STIs)  Get screened for STIs, including gonorrhea and chlamydia, if: ? You are sexually active and are younger than 66 years of  age. ? You are older than 66 years of age and your health care provider tells you that you are at risk for this type of infection. ? Your sexual activity has changed since you were last screened, and you are at increased risk for chlamydia or gonorrhea. Ask your health care provider if you are at risk.  Ask your health care provider about whether you are at high risk for HIV. Your health care provider may recommend a prescription medicine to  help prevent HIV infection. If you choose to take medicine to prevent HIV, you should first get tested for HIV. You should then be tested every 3 months for as long as you are taking the medicine. Pregnancy  If you are about to stop having your period (premenopausal) and you may become pregnant, seek counseling before you get pregnant.  Take 400 to 800 micrograms (mcg) of folic acid every day if you become pregnant.  Ask for birth control (contraception) if you want to prevent pregnancy. Osteoporosis and menopause Osteoporosis is a disease in which the bones lose minerals and strength with aging. This can result in bone fractures. If you are 67 years old or older, or if you are at risk for osteoporosis and fractures, ask your health care provider if you should:  Be screened for bone loss.  Take a calcium or vitamin D supplement to lower your risk of fractures.  Be given hormone replacement therapy (HRT) to treat symptoms of menopause. Follow these instructions at home: Lifestyle  Do not use any products that contain nicotine or tobacco, such as cigarettes, e-cigarettes, and chewing tobacco. If you need help quitting, ask your health care provider.  Do not use street drugs.  Do not share needles.  Ask your health care provider for help if you need support or information about quitting drugs. Alcohol use  Do not drink alcohol if: ? Your health care provider tells you not to drink. ? You are pregnant, may be pregnant, or are planning to become pregnant.  If you drink alcohol: ? Limit how much you use to 0-1 drink a day. ? Limit intake if you are breastfeeding.  Be aware of how much alcohol is in your drink. In the [place] one drink equals one 12 oz bottle of beer (355 mL), one 5 oz glass of wine (148 mL), or one 1 oz glass of hard liquor (44 mL). General instructions  Schedule regular health, dental, and eye exams.  Stay current with your vaccines.  Tell your health care  provider if: ? You often feel depressed. ? You have ever been abused or do not feel safe at home. Summary  Adopting a healthy lifestyle and getting preventive care are important in promoting health and wellness.  Follow your health care provider's instructions about healthy diet, exercising, and getting tested or screened for diseases.  Follow your health care provider's instructions on monitoring your cholesterol and blood pressure. This information is not intended to replace advice given to you by your health care provider. Make sure you discuss any questions you have with your health care provider. Document Revised: 10/04/2018 Document Reviewed: 10/04/2018 Elsevier Patient Education  2020 [employer].

## 2020-10-06 NOTE — Progress Notes (Signed)
Subjective:    Patient ID: Brittany [name], female    DOB: Mar 19, 1956, 64 y.o.   MRN: [id number]   This visit occurred during the SARS-CoV-2 public health emergency.  Safety protocols were in place, including screening questions prior to the visit, additional usage of staff PPE, and extensive cleaning of exam room while observing appropriate contact time as indicated for disinfecting solutions.    HPI She is here for a physical exam.   A couple of weeks ago she started having hip pain.  She has an appt to see her hip ortho in jan.  When this happened before she was told the pain was her back, not her hip.  The pain ended up resolving on its own..  She feels pain in her left lower back, hip and pain down her left leg.  She denies any weakness, but does have some tingling.  She has been taking ibuprofen with minimal improvement in the pain.     Medications and allergies reviewed with patient and updated if appropriate.  Patient Active Problem List   Diagnosis Date Noted  . 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  . Malignant neoplasm of central portion of left breast in female, estrogen receptor positive ([place]) 11/22/2019  . Bone metastases ([place]) 11/22/2019  . Anatomical narrow angle glaucoma 09/25/2018  . History of gestational diabetes 09/21/2017  . Raynaud phenomenon 09/21/2016  . 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 Left rotator cuff tear 10/07/2015  . Hypothyroid 09/28/2015  . Leukopenia 12/08/2012  . Metatarsalgia of both feet 09/21/2011  . CERVICALGIA 05/04/2010  . Asymptomatic varicose veins 03/17/2009  . Osteoarthritis 03/17/2009  . SCOLIOSIS, MILD 03/17/2009    Current Outpatient Medications on File Prior to Visit  Medication Sig Dispense Refill  . Calcium Citrate 250 MG TABS Take 4 tablets (1,000 mg total) by mouth daily.  0  . capecitabine (XELODA) 500 MG tablet Take 2 tablets (1,000 mg total) by mouth 2 (two) times daily after a meal. 120  tablet 6  . Cholecalciferol (VITAMIN D3) 1000 UNITS CAPS Take by mouth daily.    . Denosumab (XGEVA [place]) Inject into the skin.    Marland Kitchen diclofenac sodium (VOLTAREN) 1 % GEL Apply 2 g topically 3 (three) times daily as needed. 100 g 1  . levothyroxine (SYNTHROID) 50 MCG tablet TAKE 1 TABLET BY MOUTH EVERY DAY 90 tablet 3  . Multiple Vitamin (MULTIVITAMIN) tablet Take 1 tablet by mouth daily.    . Omega-3 Fatty Acids (FISH OIL) 1000 MG CPDR Take by mouth daily.     No current facility-administered medications on file prior to visit.    Past Medical History:  Diagnosis Date  . ANEMIA-NOS   . Asymptomatic varicose veins   . Breast cancer ([place])   . BREAST CANCER, HX OF 01/1995 dx   s/p Lumpectomy, XRT, chemo and 35yr armidex  . Gestational diabetes mellitus in childbirth, diet controlled 1985  . Hypothyroid 09/28/2015   Dx 09/2015  . Leukopenia    mild since chemo  . OSTEOARTHRITIS   . Personal history of chemotherapy   . Personal history of radiation therapy   . SCOLIOSIS, MILD     Past Surgical History:  Procedure Laterality Date  . BREAST BIOPSY    . BREAST LUMPECTOMY Left    1996  . left lumpectomy  1996  . TONSILLECTOMY    . TOTAL HIP ARTHROPLASTY     2002 & 2003-Dr. vail @ [facility]  Social History   Socioeconomic History  . Marital status: Married    Spouse name: Not on file  . Number of children: Not on file  . Years of education: Not on file  . Highest education level: Not on file  Occupational History  . Not on file  Tobacco Use  . Smoking status: Former Smoker    Quit date: 06/25/1977    Years since quitting: 43.3  . Smokeless tobacco: Never Used  Substance and Sexual Activity  . Alcohol use: Yes    Alcohol/week: 3.0 - 4.0 standard drinks    Types: 3 - 4 Glasses of wine per week  . Drug use: No  . Sexual activity: Not on file  Other Topics Concern  . Not on file  Social History Narrative   Married-lives with spouse & son. Art gallery manager, enjoys golf, sports,  bridge, swim, and walking dog   Social Determinants of Health   Financial Resource Strain: Not on file  Food Insecurity: Not on file  Transportation Needs: Not on file  Physical Activity: Not on file  Stress: Not on file  Social Connections: Not on file    Family History  Problem Relation Age of Onset  . Heart disease Father 91       AMI  . Hyperlipidemia Father   . Valvular heart disease Father 56  . Parkinson's disease Father 21  . Arthritis Mother   . Breast cancer Mother 49  . Hyperlipidemia Mother   . Osteoporosis Mother   . Pancreatic cancer Maternal Grandmother 73       presumed dx  . Diabetes Paternal Grandfather        type 2  . Breast cancer Maternal Aunt     Review of Systems  Constitutional: Negative for chills, fatigue and fever.  Eyes: Negative for visual disturbance.  Respiratory: Negative for cough, shortness of breath and wheezing.   Cardiovascular: Negative for chest pain, palpitations and leg swelling.  Gastrointestinal: Negative for abdominal pain, blood in stool, constipation, diarrhea and nausea.  Genitourinary: Negative for dysuria and hematuria.  Musculoskeletal: Positive for back pain.  Skin: Negative for color change and rash.       Dry fingertips - breaking  Neurological: Positive for numbness (left leg). Negative for dizziness, light-headedness and headaches.  Psychiatric/Behavioral: Negative for dysphoric mood and sleep disturbance. The patient is not nervous/anxious.        Objective:   Vitals:   10/07/20 1411  BP: 128/70  Pulse: 81  Temp: 98 F (36.7 C)  SpO2: 97%   Filed Weights   10/07/20 1411  Weight: 144 lb (65.3 kg)   Body mass index is 22.55 kg/m.  BP Readings from Last 3 Encounters:  10/07/20 128/70  09/24/20 128/79  08/27/20 135/76    Wt Readings from Last 3 Encounters:  10/07/20 144 lb (65.3 kg)  07/30/20 144 lb 11.8 oz (65.7 kg)  05/28/20 143 lb 8 oz (65.1 kg)     Physical Exam Constitutional: She  appears well-developed and well-nourished. No distress.  HENT:  Head: Normocephalic and atraumatic.  Right Ear: External ear normal. Normal ear canal and TM Left Ear: External ear normal.  Normal ear canal and TM Mouth/Throat: Oropharynx is clear and moist.  Eyes: Conjunctivae and EOM are normal.  Neck: Neck supple. No tracheal deviation present. No thyromegaly present.  No carotid bruit  Cardiovascular: Normal rate, regular rhythm and normal heart sounds.   No murmur heard.  No edema. Pulmonary/Chest: Effort normal and  breath sounds normal. No respiratory distress. She has no wheezes. She has no rales.  Breast: deferred   Abdominal: Soft. She exhibits no distension. There is no tenderness. Musculoskeletal, neurological: Some tenderness left lower back.  Normal sensation left lower leg.  Normal strength. Lymphadenopathy: She has no cervical adenopathy.  Skin: Skin is warm and dry. She is not diaphoretic.  Psychiatric: She has a normal mood and affect. Her behavior is normal.        Assessment & Plan:   Physical exam: Screening blood work    ordered Immunizations  Had covid booster  Colonoscopy  Up to date  Mammogram  ordered Gyn  Up to date  Dexa  Up to date  Eye exams  Up to date  Exercise  regular Weight     normal Substance abuse  none      See Problem List for Assessment and Plan of chronic medical problems.    Follow-up in 1 year

## 2020-10-07 ENCOUNTER — Ambulatory Visit (INDEPENDENT_AMBULATORY_CARE_PROVIDER_SITE_OTHER): Payer: Medicare Other | Admitting: Internal Medicine

## 2020-10-07 ENCOUNTER — Other Ambulatory Visit: Payer: Self-pay

## 2020-10-07 VITALS — BP 128/70 | HR 81 | Temp 98.0°F | Ht 67.0 in | Wt 144.0 lb

## 2020-10-07 DIAGNOSIS — M5416 Radiculopathy, lumbar region: Secondary | ICD-10-CM | POA: Insufficient documentation

## 2020-10-07 DIAGNOSIS — Z0000 Encounter for general adult medical examination without abnormal findings: Secondary | ICD-10-CM

## 2020-10-07 DIAGNOSIS — C7951 Secondary malignant neoplasm of bone: Secondary | ICD-10-CM

## 2020-10-07 DIAGNOSIS — E039 Hypothyroidism, unspecified: Secondary | ICD-10-CM

## 2020-10-07 MED ORDER — PREDNISONE 20 MG PO TABS: 40.0000 mg | ORAL_TABLET | Freq: Every day | ORAL | 0 refills | Status: DC

## 2020-10-07 MED ORDER — GABAPENTIN 100 MG PO CAPS: 200.0000 mg | ORAL_CAPSULE | Freq: Every day | ORAL | 3 refills | Status: DC

## 2020-10-07 NOTE — Assessment & Plan Note (Signed)
New problem Left lower back pain with radiation down left leg.  She does have some tingling, no weakness No changes in urination or bowels Ibuprofen has helped minimally We will refer to orthopedics Prednisone 40 mg daily x5 days Start gabapentin 200-300 mg at bedtime-can titrate if needed Avoid bending, lifting and twisting

## 2020-10-07 NOTE — Assessment & Plan Note (Signed)
Chronic H/o Breast cancer with bone mets On xgeva, xeloda

## 2020-10-07 NOTE — Assessment & Plan Note (Signed)
Chronic  Clinically euthyroid Currently taking levothyroxine 50 mcg daily Check tsh  Titrate med dose if needed  

## 2020-10-10 ENCOUNTER — Other Ambulatory Visit: Payer: Self-pay

## 2020-10-10 ENCOUNTER — Encounter: Payer: Self-pay | Admitting: Family Medicine

## 2020-10-10 ENCOUNTER — Ambulatory Visit (INDEPENDENT_AMBULATORY_CARE_PROVIDER_SITE_OTHER): Payer: Medicare Other | Admitting: Family Medicine

## 2020-10-10 ENCOUNTER — Ambulatory Visit (INDEPENDENT_AMBULATORY_CARE_PROVIDER_SITE_OTHER): Payer: Medicare Other

## 2020-10-10 DIAGNOSIS — M5442 Lumbago with sciatica, left side: Secondary | ICD-10-CM | POA: Diagnosis not present

## 2020-10-10 MED ORDER — CELECOXIB 200 MG PO CAPS: 200.0000 mg | ORAL_CAPSULE | Freq: Two times a day (BID) | ORAL | 6 refills | Status: DC | PRN

## 2020-10-10 MED ORDER — BACLOFEN 10 MG PO TABS: 5.0000 mg | ORAL_TABLET | Freq: Three times a day (TID) | ORAL | 3 refills | Status: DC | PRN

## 2020-10-10 NOTE — Progress Notes (Signed)
Office Visit Note   Patient: Brittany [name]           Date of Birth: 09-04-1956           MRN: [id number] Visit Date: 10/10/2020 Requested by: Binnie Rail, MD [place],  [place] [id number] PCP: Binnie Rail, MD  Subjective: Chief Complaint  Patient presents with  . Lower Back - Pain    Pain is mainly in the lateral hip and down the leg to the ankle. First noticed the pain 1 month ago while golfing. Some pain in the middle of the lower back.     HPI: She is here with left hip and leg pain.  Symptoms started about a month ago, no injury.  At first she thought it might be her hip.  She has had both hips replaced about 17 or 18 years ago at [facility].  But then she recalled a couple years ago having a similar pattern of pain and being told that it was coming from her back.  It resolved spontaneously and she has not had trouble with it since then.  She notes that her pain is worse when trying to stand up straight, it is also worse when doing a follow-through of a golf swing.  Denies any bowel or bladder dysfunction, fevers or chills.  She does have a history of metastatic breast cancer.  She had a bone scan in September showing stable lesions throughout the spine.                ROS: No fevers or chills.  No rash.  All other systems were reviewed and are negative.  Objective: Vital Signs: There were no vitals taken for this visit.  Physical Exam:  General:  Alert and oriented, in no acute distress. Pulm:  Breathing unlabored. Psy:  Normal mood, congruent affect.  Low back: Slight tenderness to palpation in the midline near the L5-S1 level.  She is moderately tender in the left sciatic notch.  Straight leg raise is equivocal, no significant pain with passive internal/external hip rotation.  No tenderness over the greater trochanter.  Lower extremity strength and reflexes are normal.    Imaging: XR Lumbar Spine 2-3 Views  Result Date: 10/10/2020 Lumbar x-rays reveal  slight anterolisthesis of L5 on S1 with facet arthropathy at that level and to a milder degree in the upper levels.  The hip prosthesis looks intact with good alignment, no sign of loosening.  There is a mottled appearance of the spine, I am not sure whether this represents metastatic cancer versus bowel gas overlying this area.    Assessment & Plan: 1.  Low back pain with left-sided sciatica, suspect due to L5-S1 disc protrusion.  Neurologic exam is nonfocal.  Cannot rule out metastatic breast cancer as a contributing factor. -I will ask her oncologist to review her x-rays.  We will try physical therapy, back extension exercises at home, and medications as needed.  If she fails to improve we will order MRI scan lumbar spine.     Procedures: No procedures performed        PMFS History: Patient Active Problem List   Diagnosis Date Noted  . Lumbar radiculopathy 10/07/2020  . 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  . Malignant neoplasm of central portion of left breast in female, estrogen receptor positive ([place]) 11/22/2019  . Bone metastases ([name]) 11/22/2019  . Anatomical narrow angle glaucoma 09/25/2018  . History of gestational diabetes 09/21/2017  . Raynaud phenomenon  09/21/2016  . 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 Left rotator cuff tear 10/07/2015  . Hypothyroid 09/28/2015  . Leukopenia 12/08/2012  . Metatarsalgia of both feet 09/21/2011  . CERVICALGIA 05/04/2010  . Asymptomatic varicose veins 03/17/2009  . Osteoarthritis 03/17/2009  . SCOLIOSIS, MILD 03/17/2009   Past Medical History:  Diagnosis Date  . ANEMIA-NOS   . Asymptomatic varicose veins   . Breast cancer ([place])   . BREAST CANCER, HX OF 01/1995 dx   s/p Lumpectomy, XRT, chemo and 14yr armidex  . Gestational diabetes mellitus in childbirth, diet controlled 1985  . Hypothyroid 09/28/2015   Dx 09/2015  . Leukopenia    mild since chemo  . OSTEOARTHRITIS   . Personal history of chemotherapy    . Personal history of radiation therapy   . SCOLIOSIS, MILD     Family History  Problem Relation Age of Onset  . Heart disease Father 54       AMI  . Hyperlipidemia Father   . Valvular heart disease Father 52  . Parkinson's disease Father 45  . Arthritis Mother   . Breast cancer Mother 17  . Hyperlipidemia Mother   . Osteoporosis Mother   . Pancreatic cancer Maternal Grandmother 73       presumed dx  . Diabetes Paternal Grandfather        type 2  . Breast cancer Maternal Aunt     Past Surgical History:  Procedure Laterality Date  . BREAST BIOPSY    . BREAST LUMPECTOMY Left    1996  . left lumpectomy  1996  . TONSILLECTOMY    . TOTAL HIP ARTHROPLASTY     2002 & 2003-Dr. vail @ [place] History   Occupational History  . Not on file  Tobacco Use  . Smoking status: Former Smoker    Quit date: 06/25/1977    Years since quitting: 43.3  . Smokeless tobacco: Never Used  Substance and Sexual Activity  . Alcohol use: Yes    Alcohol/week: 3.0 - 4.0 standard drinks    Types: 3 - 4 Glasses of wine per week  . Drug use: No  . Sexual activity: Not on file

## 2020-10-13 ENCOUNTER — Other Ambulatory Visit: Payer: Self-pay | Admitting: Oncology

## 2020-10-14 ENCOUNTER — Ambulatory Visit (INDEPENDENT_AMBULATORY_CARE_PROVIDER_SITE_OTHER): Payer: Medicare Other | Admitting: Physical Therapy

## 2020-10-14 ENCOUNTER — Other Ambulatory Visit: Payer: Self-pay

## 2020-10-14 ENCOUNTER — Encounter: Payer: Self-pay | Admitting: Physical Therapy

## 2020-10-14 ENCOUNTER — Encounter: Payer: Self-pay | Admitting: Oncology

## 2020-10-14 ENCOUNTER — Telehealth: Payer: Self-pay | Admitting: *Deleted

## 2020-10-14 DIAGNOSIS — M25552 Pain in left hip: Secondary | ICD-10-CM | POA: Diagnosis not present

## 2020-10-14 DIAGNOSIS — R2689 Other abnormalities of gait and mobility: Secondary | ICD-10-CM | POA: Diagnosis not present

## 2020-10-14 DIAGNOSIS — M6281 Muscle weakness (generalized): Secondary | ICD-10-CM

## 2020-10-14 DIAGNOSIS — M5442 Lumbago with sciatica, left side: Secondary | ICD-10-CM

## 2020-10-14 NOTE — Telephone Encounter (Signed)
This RN called per per her my chart message inquiring about possible need to do an MRI of her spine per recent xray with ortho for sciatica showing abnormal areas " could be cancer ".  This RN discussed above with pt including known bone mets including areas in her spine and need for additional information.  Brittany [name] states the pain responded well to the prednisone but returned post completed the 5 day course.  She is now using Celebrex with management that allows her to do there ADL's ( pain not totally relieved)  Pain is down her left left with no numbness or tingling. She denies any changes in her bowel or bladder habits.  This RN informed her above would be reviewed with MD and call returned to her.  Brittany [name] verbalized understanding.

## 2020-10-14 NOTE — Therapy (Signed)
[facility] [place], [place], [address] Phone: [phone]   Fax:  [phone]  Physical Therapy Evaluation  Patient Details  Name: Brittany [name] MRN: [id number] Date of Birth: 07/19/1956 Referring Provider (PT): Eunice Blase, MD   Encounter Date: 10/14/2020   PT End of Session - 10/14/20 1321    Visit Number 1    Number of Visits 12    Date for PT Re-Evaluation 12/09/20    Authorization Type MCR/BCBS    Progress Note Due on Visit 10    PT Start Time 1696    PT Stop Time 1110    PT Time Calculation (min) 55 min    Activity Tolerance Patient tolerated treatment well    Behavior During Therapy [facility] for tasks assessed/performed           Past Medical History:  Diagnosis Date  . ANEMIA-NOS   . Asymptomatic varicose veins   . Breast cancer ([place])   . BREAST CANCER, HX OF 01/1995 dx   s/p Lumpectomy, XRT, chemo and 32yr armidex  . Gestational diabetes mellitus in childbirth, diet controlled 1985  . Hypothyroid 09/28/2015   Dx 09/2015  . Leukopenia    mild since chemo  . OSTEOARTHRITIS   . Personal history of chemotherapy   . Personal history of radiation therapy   . SCOLIOSIS, MILD     Past Surgical History:  Procedure Laterality Date  . BREAST BIOPSY    . BREAST LUMPECTOMY Left    1996  . left lumpectomy  1996  . TONSILLECTOMY    . TOTAL HIP ARTHROPLASTY     2002 & 2003-Dr. vail @ [place]    There were no vitals filed for this visit.    Subjective Assessment - 10/14/20 1027    Subjective Back Pain is mainly in the Left lateral hip and down the leg to the ankle. First noticed the pain 1 month ago while golfing. Some pain in the middle of the lower back. Denies any bowel or bladder dysfunction, fevers or chills.  She does have a history of metastatic breast cancer.  She had a bone scan in September showing stable lesions throughout the spine.    Pertinent History bilat THA, She does have a history of metastatic  breast cancer.  She had a bone scan in September showing stable lesions throughout the spine.    Limitations Lifting;Standing;Walking    Diagnostic tests "Lumbar x-rays reveal slight anterolisthesis of L5 on S1 with facet arthropathy at that level and to a milder degree in the upper levels.  The hip prosthesis looks intact with good alignment, no sign of loosening. "    Currently in Pain? Yes    Pain Score --   7-10   Pain Location Back    Pain Orientation Left    Pain Descriptors / Indicators Shooting    Pain Type Acute pain    Pain Radiating Towards denies N/T but has pain radiating down her left leg into her foot    Pain Onset More than a month ago    Pain Frequency Constant    Aggravating Factors  golf,tennis, prolonged sitting    Pain Relieving Factors ice, meds    Multiple Pain Sites No              [facility] PT Assessment - 10/14/20 0001      Assessment   Medical Diagnosis M54.42 (ICD-10-CM) - Acute left-sided low back pain with left-sided sciatic    Referring Provider (  PT) Hilts, Michael, MD    Onset Date/Surgical Date --   one month onset of pain   Next MD Visit PRN    Prior Therapy none      Precautions   Precautions None      Balance Screen   Has the patient fallen in the past 6 months No    Has the patient had a decrease in activity level because of a fear of falling?  No    Is the patient reluctant to leave their home because of a fear of falling?  No      Home [employer] residence      Prior Function   Level of Independence Independent    Vocation Retired    Leisure golf      Observation/Other Assessments   Focus on Therapeutic Outcomes (FOTO)  do 2nd visit      Posture/Postural Control   Posture Comments thoracic kyphosis      ROM / Strength   AROM / PROM / Strength AROM      AROM   AROM Assessment Site Lumbar    Lumbar Flexion WNL    Lumbar Extension 25% painful    Lumbar - Right Side Bend 75%    Lumbar - Left Side  Bend 50% painful    Lumbar - Right Rotation 75%    Lumbar - Left Rotation 50% painful      Flexibility   Soft Tissue Assessment /Muscle Length --   very tight hamstrings     Palpation   Palpation comment TTP over SI joint, sacrum, and glutes      Special Tests   Other special tests + slump test on left, negative SLR tests, + SI joint tests on Lt                      Objective measurements completed on examination: See above findings.       [facility] Adult PT Treatment/Exercise - 10/14/20 0001      Modalities   Modalities Cryotherapy      Cryotherapy   Number Minutes Cryotherapy 10 Minutes    Cryotherapy Location Hip    Type of Cryotherapy Ice pack      Manual Therapy   Manual therapy comments left leg LAD                  PT Education - 10/14/20 1320    Education Details HEP, POC, exam findings    Person(s) Educated Patient    Methods Explanation;Demonstration;Verbal cues;Handout    Comprehension Verbalized understanding            PT Short Term Goals - 10/14/20 1331      PT SHORT TERM GOAL #1   Title She will be independent with inital HEP     Baseline no HEP until today    Time 4    Period Weeks    Status New    Target Date 11/11/20      PT SHORT TERM GOAL #2   Title Pt will perform FOTO for functional intake    Status New             PT Long Term Goals - 10/14/20 1332      PT LONG TERM GOAL #1   Title She will improve FOTO score to predicted value once has been completed we will know the value.    Time 8    Period Weeks  Status New    Target Date 12/09/20      PT LONG TERM GOAL #2   Title She will report pain improved to no more than 3/10 with ususal activity.    Baseline 9/10 pain    Time 8    Period Weeks    Status New    Target Date 12/09/20      PT LONG TERM GOAL #3   Title She will be able to return to light golf and tennis    Time 8    Period Weeks    Status New      PT LONG TERM GOAL #4   Title Lumbar  ROM improved to [facility] with minimal amount of pain    Time 8    Period Weeks    Status New                  Plan - 10/14/20 1322    Clinical Impression Statement Pt presents with Lt sided LBP and left hip pain that radiates down her left leg that presents more as SIJ syndrome. She will benefit from skilled PT to address her deficits in lumbar ROM, left hip strength, decreased activity tolerance, increased pain, and to allow her to return to golf/tennis. She was cautioned to advised to avoid golf/tennis for a least 4 weeks.    Examination-Activity Limitations Bend;Sleep;Squat;Stairs;Stand;Lift    Examination-Participation Restrictions Cleaning;Community Activity;Shop;Other    Stability/Clinical Decision Making Stable/Uncomplicated    Clinical Decision Making Low    Rehab Potential Good    PT Frequency 2x / week    PT Duration 8 weeks    PT Treatment/Interventions ADLs/Self Care Home Management;Cryotherapy;Electrical Stimulation;Iontophoresis 4mg /ml Dexamethasone;Moist Heat;Traction;Ultrasound;Therapeutic activities;Therapeutic exercise;Balance training;Neuromuscular re-education;Manual techniques;Passive range of motion;Dry needling;Joint Manipulations;Spinal Manipulations;Taping    PT Next Visit Plan review and update HEP PRN, SLS work, SIJ execises    PT Home Exercise Plan Access Code: [email or web address]    Consulted and Agree with Plan of Care Patient           Patient will benefit from skilled therapeutic intervention in order to improve the following deficits and impairments:  Decreased activity tolerance,Decreased balance,Decreased endurance,Decreased range of motion,Decreased strength,Increased edema,Hypomobility,Difficulty walking,Impaired flexibility,Postural dysfunction,Pain  Visit Diagnosis: Acute left-sided low back pain with left-sided sciatica  Pain in left hip  Muscle weakness (generalized)  Other abnormalities of gait and mobility     Problem List Patient Active  Problem List   Diagnosis Date Noted  . Lumbar radiculopathy 10/07/2020  . 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  . Malignant neoplasm of central portion of left breast in female, estrogen receptor positive ([place]) 11/22/2019  . Bone metastases ([place]) 11/22/2019  . Anatomical narrow angle glaucoma 09/25/2018  . History of gestational diabetes 09/21/2017  . Raynaud phenomenon 09/21/2016  . 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 Left rotator cuff tear 10/07/2015  . Hypothyroid 09/28/2015  . Leukopenia 12/08/2012  . Metatarsalgia of both feet 09/21/2011  . CERVICALGIA 05/04/2010  . Asymptomatic varicose veins 03/17/2009  . Osteoarthritis 03/17/2009  . SCOLIOSIS, MILD 03/17/2009    Silvestre Mesi 10/14/2020, 1:40 PM  [facility] [address] [place], [place], [address] Phone: [phone]   Fax:  [phone]  Name: Brittany [name] MRN: [id number] Date of Birth: 05/26/1956

## 2020-10-14 NOTE — Patient Instructions (Signed)
Access Code: [email or web address] URL: [email or web address] Date: 10/14/2020 Prepared by: Elsie Ra  Exercises Supine Hamstring Stretch with Strap - 2 x daily - 6 x weekly - 1 sets - 3 reps - 30 hold Supine Figure 4 Piriformis Stretch - 2 x daily - 6 x weekly - 1 sets - 3 reps - 30 hold Child's Pose Stretch - 2 x daily - 6 x weekly - 1 sets - 3 reps - 30 hold Supine Posterior Pelvic Tilt - 2 x daily - 6 x weekly - 2 sets - 10-20 reps - 5 hold Quadruped Alternating Leg Extensions - 2 x daily - 6 x weekly - 2-3 sets - 10 reps Standing Lumbar Extension at Wall - Forearms - 2 x daily - 6 x weekly - 1-3 sets - 10 reps

## 2020-10-16 ENCOUNTER — Other Ambulatory Visit: Payer: Self-pay | Admitting: Oncology

## 2020-10-16 ENCOUNTER — Encounter: Payer: Self-pay | Admitting: Family Medicine

## 2020-10-16 DIAGNOSIS — M5442 Lumbago with sciatica, left side: Secondary | ICD-10-CM

## 2020-10-16 DIAGNOSIS — C7951 Secondary malignant neoplasm of bone: Secondary | ICD-10-CM

## 2020-10-19 ENCOUNTER — Encounter: Payer: Self-pay | Admitting: Family Medicine

## 2020-10-19 MED ORDER — PREDNISONE 10 MG PO TABS: ORAL_TABLET | ORAL | 0 refills | Status: DC

## 2020-10-22 ENCOUNTER — Inpatient Hospital Stay (HOSPITAL_BASED_OUTPATIENT_CLINIC_OR_DEPARTMENT_OTHER): Payer: Medicare Other | Admitting: Adult Health

## 2020-10-22 ENCOUNTER — Inpatient Hospital Stay: Payer: Medicare Other

## 2020-10-22 ENCOUNTER — Other Ambulatory Visit: Payer: Self-pay

## 2020-10-22 ENCOUNTER — Encounter: Payer: Self-pay | Admitting: Adult Health

## 2020-10-22 VITALS — BP 137/83 | HR 85 | Temp 97.7°F | Resp 18 | Ht 67.0 in | Wt 142.7 lb

## 2020-10-22 DIAGNOSIS — Z170 Estrogen receptor positive status [ER+]: Secondary | ICD-10-CM

## 2020-10-22 DIAGNOSIS — C50112 Malignant neoplasm of central portion of left female breast: Secondary | ICD-10-CM

## 2020-10-22 DIAGNOSIS — I7300 Raynaud's syndrome without gangrene: Secondary | ICD-10-CM

## 2020-10-22 DIAGNOSIS — C7951 Secondary malignant neoplasm of bone: Secondary | ICD-10-CM

## 2020-10-22 DIAGNOSIS — H40039 Anatomical narrow angle, unspecified eye: Secondary | ICD-10-CM

## 2020-10-22 DIAGNOSIS — Z96643 Presence of artificial hip joint, bilateral: Secondary | ICD-10-CM

## 2020-10-22 DIAGNOSIS — M160 Bilateral primary osteoarthritis of hip: Secondary | ICD-10-CM

## 2020-10-22 DIAGNOSIS — Q6589 Other specified congenital deformities of hip: Secondary | ICD-10-CM

## 2020-10-22 LAB — CBC WITH DIFFERENTIAL/PLATELET
Abs Immature Granulocytes: 0.03 K/uL (ref 0.00–0.07)
Basophils Absolute: 0 K/uL (ref 0.0–0.1)
Basophils Relative: 0 %
Eosinophils Absolute: 0 K/uL (ref 0.0–0.5)
Eosinophils Relative: 0 %
HCT: 35.8 % — ABNORMAL LOW (ref 36.0–46.0)
Hemoglobin: 12.2 g/dL (ref 12.0–15.0)
Immature Granulocytes: 0 %
Lymphocytes Relative: 15 %
Lymphs Abs: 1.2 K/uL (ref 0.7–4.0)
MCH: 35.5 pg — ABNORMAL HIGH (ref 26.0–34.0)
MCHC: 34.1 g/dL (ref 30.0–36.0)
MCV: 104.1 fL — ABNORMAL HIGH (ref 80.0–100.0)
Monocytes Absolute: 0.2 K/uL (ref 0.1–1.0)
Monocytes Relative: 2 %
Neutro Abs: 6.7 K/uL (ref 1.7–7.7)
Neutrophils Relative %: 83 %
Platelets: 281 K/uL (ref 150–400)
RBC: 3.44 MIL/uL — ABNORMAL LOW (ref 3.87–5.11)
RDW: 14.4 % (ref 11.5–15.5)
WBC: 8.2 K/uL (ref 4.0–10.5)
nRBC: 0 % (ref 0.0–0.2)

## 2020-10-22 LAB — COMPREHENSIVE METABOLIC PANEL WITH GFR
ALT: 34 U/L (ref 0–44)
AST: 33 U/L (ref 15–41)
Albumin: 4.1 g/dL (ref 3.5–5.0)
Alkaline Phosphatase: 67 U/L (ref 38–126)
Anion gap: 6 (ref 5–15)
BUN: 15 mg/dL (ref 8–23)
CO2: 29 mmol/L (ref 22–32)
Calcium: 9.6 mg/dL (ref 8.9–10.3)
Chloride: 98 mmol/L (ref 98–111)
Creatinine, Ser: 0.86 mg/dL (ref 0.44–1.00)
GFR, Estimated: >60 mL/min
Glucose, Bld: 122 mg/dL — ABNORMAL HIGH (ref 70–99)
Potassium: 4.8 mmol/L (ref 3.5–5.1)
Sodium: 133 mmol/L — ABNORMAL LOW (ref 135–145)
Total Bilirubin: 0.5 mg/dL (ref 0.3–1.2)
Total Protein: 7.7 g/dL (ref 6.5–8.1)

## 2020-10-22 LAB — CEA (IN HOUSE-CHCC): CEA (CHCC-In House): 6.17 ng/mL — ABNORMAL HIGH (ref 0.00–5.00)

## 2020-10-22 MED ORDER — DENOSUMAB 120 MG/1.7ML ~~LOC~~ SOLN
120.0000 mg | Freq: Once | SUBCUTANEOUS | Status: AC
  Administered 2020-10-22: 120 mg via SUBCUTANEOUS

## 2020-10-22 NOTE — Patient Instructions (Signed)
Denosumab injection What is this medicine? DENOSUMAB (den oh sue mab) slows bone breakdown. Prolia is used to treat osteoporosis in women after menopause and in men, and in people who are taking corticosteroids for 6 months or more. Xgeva is used to treat a high calcium level due to cancer and to prevent bone fractures and other bone problems caused by multiple myeloma or cancer bone metastases. Xgeva is also used to treat giant cell tumor of the bone. This medicine may be used for other purposes; ask your health care provider or pharmacist if you have questions. COMMON BRAND NAME(S): Prolia, XGEVA What should I tell my health care provider before I take this medicine? They need to know if you have any of these conditions:  dental disease  having surgery or tooth extraction  infection  kidney disease  low levels of calcium or Vitamin D in the blood  malnutrition  on hemodialysis  skin conditions or sensitivity  thyroid or parathyroid disease  an unusual reaction to denosumab, other medicines, foods, dyes, or preservatives  pregnant or trying to get pregnant  breast-feeding How should I use this medicine? This medicine is for injection under the skin. It is given by a health care professional in a hospital or clinic setting. A special MedGuide will be given to you before each treatment. Be sure to read this information carefully each time. For Prolia, talk to your pediatrician regarding the use of this medicine in children. Special care may be needed. For Xgeva, talk to your pediatrician regarding the use of this medicine in children. While this drug may be prescribed for children as young as 13 years for selected conditions, precautions do apply. Overdosage: If you think you have taken too much of this medicine contact a poison control center or emergency room at once. NOTE: This medicine is only for you. Do not share this medicine with others. What if I miss a dose? It is  important not to miss your dose. Call your doctor or health care professional if you are unable to keep an appointment. What may interact with this medicine? Do not take this medicine with any of the following medications:  other medicines containing denosumab This medicine may also interact with the following medications:  medicines that lower your chance of fighting infection  steroid medicines like prednisone or cortisone This list may not describe all possible interactions. Give your health care provider a list of all the medicines, herbs, non-prescription drugs, or dietary supplements you use. Also tell them if you smoke, drink alcohol, or use illegal drugs. Some items may interact with your medicine. What should I watch for while using this medicine? Visit your doctor or health care professional for regular checks on your progress. Your doctor or health care professional may order blood tests and other tests to see how you are doing. Call your doctor or health care professional for advice if you get a fever, chills or sore throat, or other symptoms of a cold or flu. Do not treat yourself. This drug may decrease your body's ability to fight infection. Try to avoid being around people who are sick. You should make sure you get enough calcium and vitamin D while you are taking this medicine, unless your doctor tells you not to. Discuss the foods you eat and the vitamins you take with your health care professional. See your dentist regularly. Brush and floss your teeth as directed. Before you have any dental work done, tell your dentist you are   receiving this medicine. Do not become pregnant while taking this medicine or for 5 months after stopping it. Talk with your doctor or health care professional about your birth control options while taking this medicine. Women should inform their doctor if they wish to become pregnant or think they might be pregnant. There is a potential for serious side  effects to an unborn child. Talk to your health care professional or pharmacist for more information. What side effects may I notice from receiving this medicine? Side effects that you should report to your doctor or health care professional as soon as possible:  allergic reactions like skin rash, itching or hives, swelling of the face, lips, or tongue  bone pain  breathing problems  dizziness  jaw pain, especially after dental work  redness, blistering, peeling of the skin  signs and symptoms of infection like fever or chills; cough; sore throat; pain or trouble passing urine  signs of low calcium like fast heartbeat, muscle cramps or muscle pain; pain, tingling, numbness in the hands or feet; seizures  unusual bleeding or bruising  unusually weak or tired Side effects that usually do not require medical attention (report to your doctor or health care professional if they continue or are bothersome):  constipation  diarrhea  headache  joint pain  loss of appetite  muscle pain  runny nose  tiredness  upset stomach This list may not describe all possible side effects. Call your doctor for medical advice about side effects. You may report side effects to FDA at 1-800-FDA-1088. Where should I keep my medicine? This medicine is only given in a clinic, doctor's office, or other health care setting and will not be stored at home. NOTE: This sheet is a summary. It may not cover all possible information. If you have questions about this medicine, talk to your doctor, pharmacist, or health care provider.  2020 Elsevier/Gold Standard (2018-02-17 16:10:44)

## 2020-10-22 NOTE — Progress Notes (Signed)
[facility]  [phone] [phone]     ID: Brittany [name] DOB: 1956-01-25  MR#: [id number]  JYN#:[id number]  Patient Care Team: Binnie Rail, MD as PCP - General (Internal Medicine) Aloha Gell, MD as Consulting Physician (Obstetrics and Gynecology) Sydnee Levans, MD (Dermatology) Magrinat, Virgie Dad, MD as Consulting Physician (Oncology) Richmond Campbell, MD as Consulting Physician (Gastroenterology) Stefanie Libel, MD as Consulting Physician (Sports Medicine) Scot Dock, NP OTHER MD:   CHIEF COMPLAINT: Stage IV lobular breast cancer  CURRENT TREATMENT: Delton See; capecitabine   INTERVAL HISTORY: Brittany [name] returns today for follow up of her stage IV lobular breast cancer.   Since her last visit, she underwent restaging bone scan on 07/22/2020 showing stable disease with no new abnormalities.  She was restarted on capecitabine at an even lower dose, 1000 mg in the morning only, on 03/25/2020.  She has no mouth sores or diarrhea.  She has no significant palmar plantar erythrodysesthesia.  She does have some cracking in her fingertips and is applying urea cream to her hands and feet.    She began [place] on 12/13/2019.   We are following her tumor markers Results for DIKSHA, TAGLIAFERRO "Brittany" (MRN [id number]) as of 10/22/2020 08:30  Ref. Range 05/28/2020 09:37 07/02/2020 13:24 07/30/2020 09:32 08/27/2020 13:09 09/24/2020 13:02  CA 27.29 Latest Ref Range: 0.0 - 38.6 U/mL 58.7 (H) 55.7 (H) 46.5 (H) 40.5 (H) 38.4  Results for BREIONA, COUVILLON "Brittany" (MRN [id number]) as of 10/22/2020 08:30  Ref. Range 05/28/2020 09:37 07/02/2020 13:24 07/30/2020 09:32 08/27/2020 13:09 09/24/2020 13:02  CEA ([facility]) Latest Ref Range: 0.00 - 5.00 ng/mL 9.33 (H) 7.21 (H) 6.21 (H) 5.33 (H) 5.74 (H)   REVIEW OF SYSTEMS: Brittany [name] is doing quite well.  She did develop left hip pain and was evaluated by her ortho MD, however soon after developed significant left sciatic pain.  She is  doing better since being treated with steroids, and is scheduled for a spine MRI next week.    She is otherwise feeling well and is tolerating her treatment well.  A detailed ROS was otherwise non contributory.     HISTORY OF CURRENT ILLNESS: From the original intake note:  "Brittany" [name] has a history of left breast cancer dating back to 70.  She was followed by Dr. Beryle Beams and underwent lumpectomy, adjuvant chemotherapy, radiation and "hormonal therapy".  Per Brittany's recollection the tumor was less than 2 cm, 2 out of 10 lymph nodes were involved, and it was a lobular breast cancer.  She took tamoxifen for many years and then raloxifene.  Dr. Beryle Beams also evaluated her for leukopenia which was noted at first in 2011, worked up with a negative ANA and negative rheumatoid factors.  He felt it was a benign residual from her earlier chemotherapy.  She was released from follow-up in 2015.  She has continued on intensified screening because of her family history.  On November 23, 2017 she had bilateral breast MRIs which were unremarkable.  Mammography November 20, 2018 showed no findings suspicious of malignancy.  Breast MRI 10/30/2019 showed a new irregular 0.4 cm enhancing focus in the upper inner quadrant of the right breast.  The left breast continued to be unremarkable except for postoperative changes and there were no abnormal appearing lymph nodes.  However in that same MRI multiple areas of enhancement were noted in the sternum.  This had not been seen in the prior MRI.  Plan right MRI biopsy 11/16/2019 was canceled  as the previously demonstrated 0.4 cm focus in the right breast showed only a tortuous vein at that location.  However bone scan 11/19/2019 obtained to follow-up on the sternal findings showed additional areas of concern at T12, T7-T11, L4 and ribs, as well as the sternum.  The patient's subsequent history is as detailed below.   PAST MEDICAL HISTORY: Past Medical History:   Diagnosis Date  . ANEMIA-NOS   . Asymptomatic varicose veins   . Breast cancer ([place])   . BREAST CANCER, HX OF 01/1995 dx   s/p Lumpectomy, XRT, chemo and 46yrarmidex  . Gestational diabetes mellitus in childbirth, diet controlled 1985  . Hypothyroid 09/28/2015   Dx 09/2015  . Leukopenia    mild since chemo  . OSTEOARTHRITIS   . Personal history of chemotherapy   . Personal history of radiation therapy   . SCOLIOSIS, MILD     PAST SURGICAL HISTORY: Past Surgical History:  Procedure Laterality Date  . BREAST BIOPSY    . BREAST LUMPECTOMY Left    1996  . left lumpectomy  1996  . TONSILLECTOMY    . TOTAL HIP ARTHROPLASTY     2002 & 2003-Dr. vail @ [facility]    FAMILY HISTORY Family History  Problem Relation Age of Onset  . Heart disease Father 526      AMI  . Hyperlipidemia Father   . Valvular heart disease Father 723 . Parkinson's disease Father 735 . Arthritis Mother   . Breast cancer Mother 558 . Hyperlipidemia Mother   . Osteoporosis Mother   . Pancreatic cancer Maternal Grandmother 73       presumed dx  . Diabetes Paternal Grandfather        type 2  . Breast cancer Maternal Aunt   The patient's father died at the age of 881from heart disease.  He had Lewy body dementia.  The patient's mother is 864years old as of January 2021.  She is in a memory unit.  She has a history of breast cancer diagnosed in her 560s(ductal carcinoma in situ).  The patient's mother sister had breast cancer diagnosed around age 66  The patient's mother's mother also had cancer, "internal" (possibly ovarian or gastrointestinal).  There is no cancer on the father's side of the family.   GYNECOLOGIC HISTORY:  No LMP recorded. Patient is postmenopausal. Menarche: 64years old Age [place] first live birth[place] 64[place] with chemotherapy, in 1996 Contraceptive a little over a year, with no complications HRT no  Hysterectomy?  No Salpingo-oophorectomy?  No   SOCIAL HISTORY:  [place]ar [place] [employer] in psychology and IT.  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  She and her husband JClair Gullingmet while working for [employer]  [place] to some [place] at that time.  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  They both finally retired 03/28/2020.  Their daughter JAnderson Maltais a hPharmacist, hospitalin [place]  Their son JJenny Reichmannworks for the same wRyland Groupthat the patient works for.  Note that JJenny Reichmanndid the [employer]  The [place] 1 grandchild ([place]  The patient attends [employer] The patient's husband is her healthcare power of attorney   HEALTH MAINTENANCE: Social History  Tobacco Use  . Smoking status: Former Smoker    Quit date: 06/25/1977    Years since quitting: 43.3  . Smokeless tobacco: Never Used  Substance Use Topics  . Alcohol use: Yes    Alcohol/week: 3.0 - 4.0 standard drinks    Types: 3 - 4 Glasses of wine per week  . Drug use: No     Colonoscopy: 2015  PAP: Up-to-date  Bone density: 11/20/2018, normal   No Known Allergies  Current Outpatient Medications  Medication Sig Dispense Refill  . baclofen (LIORESAL) 10 MG tablet Take 0.5-1 tablets (5-10 mg total) by mouth 3 (three) times daily as needed for muscle spasms. 30 each 3  . Calcium Citrate 250 MG TABS Take 4 tablets (1,000 mg total) by mouth daily.  0  . capecitabine (XELODA) 500 MG tablet Take 2 tablets (1,000 mg total) by mouth 2 (two) times daily after a meal. 120 tablet 6  . celecoxib (CELEBREX) 200 MG capsule Take 1 capsule (200 mg total) by mouth 2 (two) times daily as needed. 60 capsule 6  . Cholecalciferol (VITAMIN D3) 1000 UNITS CAPS Take by mouth daily.    . Denosumab (XGEVA [place]) Inject into the skin.    Marland Kitchen diclofenac sodium (VOLTAREN) 1 % GEL Apply 2 g topically 3 (three) times daily as  needed. 100 g 1  . gabapentin (NEURONTIN) 100 MG capsule Take 2-3 capsules (200-300 mg total) by mouth at bedtime. 90 capsule 3  . levothyroxine (SYNTHROID) 50 MCG tablet TAKE 1 TABLET BY MOUTH EVERY DAY 90 tablet 3  . Multiple Vitamin (MULTIVITAMIN) tablet Take 1 tablet by mouth daily.    . Omega-3 Fatty Acids (FISH OIL) 1000 MG CPDR Take by mouth daily.    . predniSONE (DELTASONE) 10 MG tablet Take as directed for 12 days.  Daily dose 6,6,5,5,4,4,3,3,2,2,1,1. 42 tablet 0  . predniSONE (DELTASONE) 20 MG tablet Take 2 tablets (40 mg total) by mouth daily with breakfast. 10 tablet 0   No current facility-administered medications for this visit.   Facility-Administered Medications Ordered in Other Visits  Medication Dose Route Frequency Provider Last Rate Last Admin  . denosumab (XGEVA) injection 120 mg  120 mg Subcutaneous Once Magrinat, Virgie Dad, MD        OBJECTIVE:  white woman in no acute distress  Vitals:   10/22/20 1247  BP: 137/83  Pulse: 85  Resp: 18  Temp: 97.7 F (36.5 C)  SpO2: 100%     Body mass index is 22.35 kg/m.   Wt Readings from Last 3 Encounters:  10/22/20 142 lb 11.2 oz (64.7 kg)  10/07/20 144 lb (65.3 kg)  07/30/20 144 lb 11.8 oz (65.7 kg)      ECOG FS:1 - Symptomatic but completely ambulatory GENERAL: Patient is a well appearing female in no acute distress HEENT:  Sclerae anicteric.  Oropharynx clear and moist. No ulcerations or evidence of oropharyngeal candidiasis. Neck is supple.  NODES:  No cervical, supraclavicular, or axillary lymphadenopathy palpated.  BREAST EXAM:  Right breast benign, left breast s/p lumpectomy and radiation, no sign of local recurrence. LUNGS:  Clear to auscultation bilaterally.  No wheezes or rhonchi. HEART:  Regular rate and rhythm. No murmur appreciated. ABDOMEN:  Soft, nontender.  Positive, normoactive bowel sounds. No organomegaly palpated. MSK:  No focal spinal tenderness to palpation. Full range of motion bilaterally in  the upper extremities. EXTREMITIES:  No peripheral edema.   SKIN:  Clear with no obvious rashes or skin changes. No nail  dyscrasia. NEURO:  Nonfocal. Well oriented.  Appropriate affect.     LAB RESULTS:  CMP     Component Value Date/Time   NA 133 (L) 10/22/2020 1229   NA 139 12/10/2013 0841   K 4.8 10/22/2020 1229   K 4.5 12/10/2013 0841   CL 98 10/22/2020 1229   CL 102 12/01/2012 0955   CO2 29 10/22/2020 1229   CO2 28 12/10/2013 0841   GLUCOSE 122 (H) 10/22/2020 1229   GLUCOSE 81 12/10/2013 0841   GLUCOSE 88 12/01/2012 0955   BUN 15 10/22/2020 1229   BUN 14 08/04/2015 0000   BUN 17.5 12/10/2013 0841   CREATININE 0.86 10/22/2020 1229   CREATININE 0.9 12/10/2013 0841   CALCIUM 9.6 10/22/2020 1229   CALCIUM 9.6 12/10/2013 0841   PROT 7.7 10/22/2020 1229   PROT 6.7 12/10/2013 0841   ALBUMIN 4.1 10/22/2020 1229   ALBUMIN 3.9 12/10/2013 0841   AST 33 10/22/2020 1229   AST 22 12/10/2013 0841   ALT 34 10/22/2020 1229   ALT 27 12/10/2013 0841   ALKPHOS 67 10/22/2020 1229   ALKPHOS 54 12/10/2013 0841   BILITOT 0.5 10/22/2020 1229   BILITOT 0.41 12/10/2013 0841   GFRNONAA >60 10/22/2020 1229   GFRAA >60 07/02/2020 1324    No results found for: Ronnald Ramp, A1GS, A2GS, BETS, BETA2SER, GAMS, MSPIKE, SPEI  No results found for: Nils Pyle, [facility]  Lab Results  Component Value Date   WBC 8.2 10/22/2020   NEUTROABS 6.7 10/22/2020   HGB 12.2 10/22/2020   HCT 35.8 (L) 10/22/2020   MCV 104.1 (H) 10/22/2020   PLT 281 10/22/2020      Chemistry      Component Value Date/Time   NA 133 (L) 10/22/2020 1229   NA 139 12/10/2013 0841   K 4.8 10/22/2020 1229   K 4.5 12/10/2013 0841   CL 98 10/22/2020 1229   CL 102 12/01/2012 0955   CO2 29 10/22/2020 1229   CO2 28 12/10/2013 0841   BUN 15 10/22/2020 1229   BUN 14 08/04/2015 0000   BUN 17.5 12/10/2013 0841   CREATININE 0.86 10/22/2020 1229   CREATININE 0.9 12/10/2013 0841   GLU 102  08/04/2015 0000      Component Value Date/Time   CALCIUM 9.6 10/22/2020 1229   CALCIUM 9.6 12/10/2013 0841   ALKPHOS 67 10/22/2020 1229   ALKPHOS 54 12/10/2013 0841   AST 33 10/22/2020 1229   AST 22 12/10/2013 0841   ALT 34 10/22/2020 1229   ALT 27 12/10/2013 0841   BILITOT 0.5 10/22/2020 1229   BILITOT 0.41 12/10/2013 0841      No results found for: LABCA2  No components found for: ERDEYC144  No results for input(s): INR in the last 168 hours.  No results found for: LABCA2  No results found for: CAN199  Lab Results  Component Value Date   CAN125 21.1 11/23/2019    No results found for: YJE563  Lab Results  Component Value Date   CA2729 38.4 09/24/2020    No components found for: HGQUANT  Lab Results  Component Value Date   CEA1 5.74 (H) 09/24/2020   /  CEA ([facility])  Date Value Ref Range Status  09/24/2020 5.74 (H) 0.00 - 5.00 ng/mL Final    Comment:    (NOTE) This test was performed using Architect's Chemiluminescent Microparticle Immunoassay. Values obtained from different assay methods cannot be used interchangeably. Please note that 5-10% of patients who smoke may see  CEA levels up to 6.9 ng/mL. Performed at [facility], [place] [address] [place], [place] [address]      No results found for: AFPTUMOR  No results found for: [place]   No results found for: HGBA, HGBA2QUANT, HGBFQUANT, HGBSQUAN (Hemoglobinopathy evaluation)   Lab Results  Component Value Date   LDH 193 12/10/2013    No results found for: IRON, TIBC, IRONPCTSAT (Iron and TIBC)  No results found for: FERRITIN  Urinalysis    Component Value Date/Time   COLORURINE YELLOW 09/17/2014 [place] 09/17/2014 0824   LABSPEC 1.015 09/17/2014 0824   PHURINE 7.5 09/17/2014 [place] 09/17/2014 [place] 09/17/2014 [place] 09/17/2014 0824   KETONESUR NEGATIVE 09/17/2014 0824    UROBILINOGEN 0.2 09/17/2014 0824   NITRITE NEGATIVE 09/17/2014 0824   LEUKOCYTESUR NEGATIVE 09/17/2014 0824    STUDIES: XR Lumbar Spine 2-3 Views  Result Date: 10/10/2020 Lumbar x-rays reveal slight anterolisthesis of L5 on S1 with facet arthropathy at that level and to a milder degree in the upper levels.  The hip prosthesis looks intact with good alignment, no sign of loosening.  There is a mottled appearance of the spine, I am not sure whether this represents metastatic cancer versus bowel gas overlying this area.     ELIGIBLE FOR AVAILABLE RESEARCH PROTOCOL: no  ASSESSMENT: 64 y.o. [place] woman status post left lumpectomy 1996 for a T1 N1, anatomic stage II invasive lobular breast cancer, estrogen receptor positive  (a) s/p adjuvant chemotherapy with doxorubicin and cyclophosphamide x4  (b) status post adjuvant radiation  (c) status post tamoxifen for 5+ years  (1) benign leukopenia: noted 2011, felt secondary to prior chemotherapy  (2) genetics testing 06/23/2018 through [facility] MultiCancer panel found no deleterious mutations in the 84 genes tested including BRCA 1-2  (a) 2 variants of uncertain significance were fund in [place] [c.1037C>T (p.Ser346Phe) and c.503C>G (p.ALA168Gly)  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, 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 (3) staging studies:  (a) breast MRI 10/30/2019 shows sternal mottling  (b) bone scan 11/19/2019 shows multiple areas of uptake suspicious for metastatic disease  (c) CT of the chest abdomen and pelvis 11/28/2019 shows no visceral disease  (d) tumor markers 11/23/2019 shows a CEA of 18.11, CA 27-29 of 61.2, CA 125 of 21.1  (e) F-18-estradiol PET (cerianna) 01/11/2020 shows multiple sclerotic bone lesions which do not accumulate the estrogen receptor specific radiotracer  (f) bone marrow biopsy 01/25/2020 confirms metastatic breast cancer, gross cystic disease fluid protein positive, but estrogen and progesterone receptor negative.   HER-2/neu was 2+ on immunohistochemistry negative by FISH (1.35/1.70).  (g) CARIS confirms triple negative disease, finds a positive androgen receptor at 3+, 70%; and the PIK3CA was indeterminate.  PTEN was positive by IHC at 90%.  PD-L1 was negative.  Genomic LOH was low and MSI was stable with proficient mismatch mismatch repair.  BRCA 1 and 2 were negative.  (4) denosumab/Xgeva started 12/13/2019, repeated monthly  (5) metronomic capecitabine started 02/25/2020, with significant plantar dysesthesia developing after 1 week, drug held 03/12/2020  (a) capecitabine restarted 03/25/2020 at 1000 mg in the morning only.  (b) bone scan 07/23/2020 shows no evidence of disease progression   (c) other options include CMF, sacituzumab govitecan, bicalutamide   PLAN: Brittany [name] is here today for f/u of her metastatic breast cancer.  She continues on Capecitabine 1043m per day and is tolerating this moderately well.  She will continue on this.  Her tumor markers show evidence of a response, she has no clinical signs of progression, and her most recent bone scan showed no evidence of progression.    We will await her MRI results from next week and I asked that she ensure her ortho MD sends them to [place].   She will receive Xgeva today which she tolerates well.    Brittany will return in 4 weeks for labs and an injection and we will see her in 8 weeks for labs, f/u, her next injection.  She knows to call for any questions that may arise between now and her next appointment.  We are happy to see her sooner if needed.   Total encounter time 30 minutes.Wilber Bihari, NP 10/22/20 1:37 PM Medical Oncology and Hematology [facility] [place], [place] [address] Tel. [phone]    Fax. [phone]    *Total Encounter Time as defined by the Centers for Medicare and Medicaid Services includes, in addition to the face-to-face time of a patient visit (documented in the note above)  non-face-to-face time: obtaining and reviewing outside history, ordering and reviewing medications, tests or procedures, care coordination (communications with other health care professionals or caregivers) and documentation in the medical record.

## 2020-10-23 ENCOUNTER — Telehealth: Payer: Self-pay | Admitting: Adult Health

## 2020-10-23 LAB — CANCER ANTIGEN 27.29: CA 27.29: 37.4 U/mL (ref 0.0–38.6)

## 2020-10-23 NOTE — Telephone Encounter (Signed)
Scheduled appts per 12/29 los. Pt confirmed appt dates and times.

## 2020-10-28 ENCOUNTER — Encounter: Payer: Self-pay | Admitting: Oncology

## 2020-10-29 ENCOUNTER — Telehealth: Payer: Self-pay

## 2020-10-29 ENCOUNTER — Other Ambulatory Visit: Payer: Self-pay

## 2020-10-29 DIAGNOSIS — C7951 Secondary malignant neoplasm of bone: Secondary | ICD-10-CM

## 2020-10-29 DIAGNOSIS — Z96643 Presence of artificial hip joint, bilateral: Secondary | ICD-10-CM

## 2020-10-29 DIAGNOSIS — Z170 Estrogen receptor positive status [ER+]: Secondary | ICD-10-CM

## 2020-10-29 DIAGNOSIS — C50112 Malignant neoplasm of central portion of left female breast: Secondary | ICD-10-CM

## 2020-10-29 NOTE — Telephone Encounter (Signed)
Please advise 

## 2020-10-29 NOTE — Telephone Encounter (Signed)
If he has ordered MR of the total spine, we should be able to cancel the lumbar MRI I ordered.

## 2020-10-29 NOTE — Progress Notes (Signed)
Pt called regarding following up with my chart message.    Patient followed up with recent doctor regarding imaging obtained of hip, and findings were present on x-ray imaging.    RN reviewed with MD -  MD recommendations to obtain MRI pelvis.    Pt aware and verbalized agreement.    Pt scheduled for MRI of lumbar spine on 1/6 - RN will send for urgent authorization to see if this can be completed at same time.  Pt aware we will attempt to obtain authorization and update her once we have.

## 2020-10-29 NOTE — Telephone Encounter (Signed)
I called and spoke with Brittany [name], advising no need for the Lsp MRI that was ordered by Dr. Prince Rome since Dr. Darnelle Catalan has ordered a total spine. He said they will just move her appt for the total spine on 11/04/20 to tomorrow, in place of the MRI we had ordered.

## 2020-10-29 NOTE — Telephone Encounter (Signed)
Kendal Hymen at [facility] hospital would like to know if patient needs to have both MRI's done?  Stated that [facility] would like for patient to have a scan done on Tuesday, 11/05/2019.  Cb# [phone].  Please advise.  Thank you.

## 2020-10-30 ENCOUNTER — Ambulatory Visit (HOSPITAL_COMMUNITY)
Admission: RE | Admit: 2020-10-30 | Discharge: 2020-10-30 | Disposition: A | Discharge status: Home / Self Care | Payer: Medicare Other | Source: Ambulatory Visit | Attending: Oncology | Admitting: Oncology

## 2020-10-30 ENCOUNTER — Encounter: Payer: Self-pay | Admitting: Oncology

## 2020-10-30 ENCOUNTER — Ambulatory Visit (HOSPITAL_COMMUNITY): Admission: RE | Admit: 2020-10-30 | Payer: Medicare Other | Source: Ambulatory Visit

## 2020-10-30 ENCOUNTER — Other Ambulatory Visit: Payer: Self-pay

## 2020-10-30 ENCOUNTER — Other Ambulatory Visit (HOSPITAL_COMMUNITY): Payer: Medicare Other

## 2020-10-30 ENCOUNTER — Encounter (HOSPITAL_COMMUNITY): Payer: Self-pay

## 2020-10-30 DIAGNOSIS — C7951 Secondary malignant neoplasm of bone: Secondary | ICD-10-CM

## 2020-10-30 DIAGNOSIS — Z96643 Presence of artificial hip joint, bilateral: Secondary | ICD-10-CM

## 2020-10-30 DIAGNOSIS — C50112 Malignant neoplasm of central portion of left female breast: Secondary | ICD-10-CM

## 2020-10-30 DIAGNOSIS — Z170 Estrogen receptor positive status [ER+]: Secondary | ICD-10-CM

## 2020-11-01 ENCOUNTER — Ambulatory Visit (HOSPITAL_COMMUNITY)
Admission: RE | Admit: 2020-11-01 | Discharge: 2020-11-01 | Disposition: A | Discharge status: Home / Self Care | Payer: Medicare Other | Source: Ambulatory Visit | Attending: Oncology | Admitting: Oncology

## 2020-11-01 ENCOUNTER — Other Ambulatory Visit: Payer: Self-pay

## 2020-11-01 DIAGNOSIS — C7951 Secondary malignant neoplasm of bone: Secondary | ICD-10-CM | POA: Insufficient documentation

## 2020-11-01 DIAGNOSIS — Z170 Estrogen receptor positive status [ER+]: Secondary | ICD-10-CM | POA: Insufficient documentation

## 2020-11-01 DIAGNOSIS — C50112 Malignant neoplasm of central portion of left female breast: Secondary | ICD-10-CM | POA: Diagnosis present

## 2020-11-01 DIAGNOSIS — Z96643 Presence of artificial hip joint, bilateral: Secondary | ICD-10-CM | POA: Diagnosis present

## 2020-11-01 MED ORDER — GADOBUTROL 1 MMOL/ML IV SOLN
6.0000 mL | Freq: Once | INTRAVENOUS | Status: AC | PRN
  Administered 2020-11-01: 6 mL via INTRAVENOUS

## 2020-11-02 MED ORDER — GABAPENTIN 300 MG PO CAPS: 300.0000 mg | ORAL_CAPSULE | Freq: Every day | ORAL | 1 refills | Status: DC

## 2020-11-02 MED ORDER — BACLOFEN 20 MG PO TABS: 20.0000 mg | ORAL_TABLET | Freq: Three times a day (TID) | ORAL | 3 refills | Status: DC | PRN

## 2020-11-02 NOTE — Addendum Note (Signed)
Addended by: Hortencia Pilar on: 11/02/2020 05:48 PM   Modules accepted: Orders

## 2020-11-03 ENCOUNTER — Other Ambulatory Visit: Payer: Self-pay

## 2020-11-03 ENCOUNTER — Ambulatory Visit (INDEPENDENT_AMBULATORY_CARE_PROVIDER_SITE_OTHER): Payer: Medicare Other | Admitting: Physical Therapy

## 2020-11-03 ENCOUNTER — Other Ambulatory Visit: Payer: Self-pay | Admitting: Oncology

## 2020-11-03 DIAGNOSIS — C50112 Malignant neoplasm of central portion of left female breast: Secondary | ICD-10-CM

## 2020-11-03 DIAGNOSIS — M25552 Pain in left hip: Secondary | ICD-10-CM | POA: Diagnosis not present

## 2020-11-03 DIAGNOSIS — M6281 Muscle weakness (generalized): Secondary | ICD-10-CM | POA: Diagnosis not present

## 2020-11-03 DIAGNOSIS — M5442 Lumbago with sciatica, left side: Secondary | ICD-10-CM

## 2020-11-03 MED ORDER — CAPECITABINE 500 MG PO TABS: 1000.0000 mg | ORAL_TABLET | Freq: Every morning | ORAL | 6 refills | Status: DC

## 2020-11-03 NOTE — Patient Instructions (Signed)
Access Code: [email or web address] URL: [email or web address] Date: 11/03/2020 Prepared by: Elsie Ra  Exercises Seated Lumbar Flexion Stretch - 2 x daily - 6 x weekly - 10 reps - 1 sets - 10 hold Supine ITB Stretch with Strap - 2 x daily - 6 x weekly - 2-3 reps - 30 hold Supine Figure 4 Piriformis Stretch - 2 x daily - 6 x weekly - 1 sets - 3 reps - 30 hold Hooklying Single Knee to Chest Stretch - 2 x daily - 6 x weekly - 2 reps - 1 sets - 20 hold Supine Posterior Pelvic Tilt - 2 x daily - 6 x weekly - 2 sets - 10-20 reps - 5 hold Clamshell with Resistance - 2 x daily - 6 x weekly - 3 sets - 10 reps Sidelying Reverse Clamshell - 2 x daily - 6 x weekly - 10 reps - 1-2 sets

## 2020-11-03 NOTE — Therapy (Signed)
[place], [place], [address] Phone: [phone]   Fax:  [phone]  Physical Therapy Treatment  Patient Details  Name: Brittany [name] MRN: [id number] Date of Birth: [birth year] Referring Provider (PT): Eunice Blase, MD   Encounter Date: 11/03/2020   PT End of Session - 11/03/20 1203    Visit Number 2    Number of Visits 12    Date for PT Re-Evaluation 12/09/20    Authorization Type MCR/BCBS    Progress Note Due on Visit 10    PT Start Time [id number]    PT Stop Time 1110    PT Time Calculation (min) 55 min    Activity Tolerance Patient tolerated treatment well    Behavior During Therapy [facility] for tasks assessed/performed           Past Medical History:  Diagnosis Date  . ANEMIA-NOS   . Asymptomatic varicose veins   . Breast cancer ([place])   . BREAST CANCER, HX OF 01/1995 dx   s/p Lumpectomy, XRT, chemo and 56yr armidex  . Gestational diabetes mellitus in childbirth, diet controlled 1985  . Hypothyroid 09/28/2015   Dx 09/2015  . Leukopenia    mild since chemo  . OSTEOARTHRITIS   . Personal history of chemotherapy   . Personal history of radiation therapy   . SCOLIOSIS, MILD     Past Surgical History:  Procedure Laterality Date  . BREAST BIOPSY    . BREAST LUMPECTOMY Left    1996  . left lumpectomy  1996  . TONSILLECTOMY    . TOTAL HIP ARTHROPLASTY     2002 & 2003-Dr. vail @ [facility]    There were no vitals filed for this visit.   Subjective Assessment - 11/03/20 1201    Subjective relays her back pain is not improving, she had new MRI done. Pain is still in left low back, hip, and shoots down her Lt leg    Pertinent History bilat THA, She does have a history of metastatic breast cancer.  She had a bone scan in September showing stable lesions throughout the spine.    Limitations Lifting;Standing;Walking    Diagnostic tests "Lumbar x-rays reveal slight anterolisthesis of L5 on S1 with facet arthropathy at  that level and to a milder degree in the upper levels.  The hip prosthesis looks intact with good alignment, no sign of loosening. "    Pain Onset More than a month ago              [facility] Adult PT Treatment/Exercise - 11/03/20 0001      Exercises   Exercises Lumbar      Lumbar Exercises: Stretches   Single Knee to Chest Stretch Left;3 reps;30 seconds    Lower Trunk Rotation 3 reps;10 seconds    Lower Trunk Rotation Limitations started having pain moving to her Rt side so was discontinued    Other Lumbar Stretch Exercise seated lumbar flexion rolling stool out 10 sec X 10      Lumbar Exercises: Aerobic   Recumbent Bike 8 min L3      Lumbar Exercises: Supine   Pelvic Tilt 10 reps;5 seconds      Lumbar Exercises: Sidelying   Clam Left    Clam Limitations 2X10    Other Sidelying Lumbar Exercises reveres clam on Lt 2X10      Cryotherapy   Number Minutes Cryotherapy 10 Minutes    Cryotherapy Location Hip    Type of Cryotherapy Ice  pack      Manual Therapy   Manual therapy comments left leg LAD, started having pain after 4th bout of 60 seconds so discontinued                    PT Short Term Goals - 10/14/20 1331      PT SHORT TERM GOAL #1   Title She will be independent with inital HEP     Baseline no HEP until today    Time 4    Period Weeks    Status New    Target Date 11/11/20      PT SHORT TERM GOAL #2   Title Pt will perform FOTO for functional intake    Status New             PT Long Term Goals - 10/14/20 1332      PT LONG TERM GOAL #1   Title She will improve FOTO score to predicted value once has been completed we will know the value.    Time 8    Period Weeks    Status New    Target Date 12/09/20      PT LONG TERM GOAL #2   Title She will report pain improved to no more than 3/10 with ususal activity.    Baseline 9/10 pain    Time 8    Period Weeks    Status New    Target Date 12/09/20      PT LONG TERM GOAL #3   Title She will  be able to return to light golf and tennis    Time 8    Period Weeks    Status New      PT LONG TERM GOAL #4   Title Lumbar ROM improved to [facility] with minimal amount of pain    Time 8    Period Weeks    Status New                 Plan - 11/03/20 1205    Clinical Impression Statement New MRI of back and pelvis showing "Diffuse osseous metastatic disease. No significant epidural disease. No recent compression fracture. No evidence of leptomeningeal disease. Moderate bilat hip bursitis. She was having pain with her HEP so this was revised today as she appears to have direction preference for flexion based stretching.    Examination-Activity Limitations Bend;Sleep;Squat;Stairs;Stand;Lift    Examination-Participation Restrictions Cleaning;Community Activity;Shop;Other    Stability/Clinical Decision Making Stable/Uncomplicated    Rehab Potential Good    PT Frequency 2x / week    PT Duration 8 weeks    PT Treatment/Interventions ADLs/Self Care Home Management;Cryotherapy;Electrical Stimulation;Iontophoresis 4mg /ml Dexamethasone;Moist Heat;Traction;Ultrasound;Therapeutic activities;Therapeutic exercise;Balance training;Neuromuscular re-education;Manual techniques;Passive range of motion;Dry needling;Joint Manipulations;Spinal Manipulations;Taping    PT Next Visit Plan how was new HEP?, consider DN    PT Home Exercise Plan Access Code: [email or web address]    Consulted and Agree with Plan of Care Patient           Patient will benefit from skilled therapeutic intervention in order to improve the following deficits and impairments:  Decreased activity tolerance,Decreased balance,Decreased endurance,Decreased range of motion,Decreased strength,Increased edema,Hypomobility,Difficulty walking,Impaired flexibility,Postural dysfunction,Pain  Visit Diagnosis: Acute left-sided low back pain with left-sided sciatica  Pain in left hip  Muscle weakness (generalized)     Problem List Patient Active  Problem List   Diagnosis Date Noted  . Lumbar radiculopathy 10/07/2020  . 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  . Malignant neoplasm of central portion of  left breast in female, estrogen receptor positive ([place]) 11/22/2019  . Bone metastases ([place]) 11/22/2019  . Anatomical narrow angle glaucoma 09/25/2018  . History of gestational diabetes 09/21/2017  . Raynaud phenomenon 09/21/2016  . 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 Left rotator cuff tear 10/07/2015  . Hypothyroid 09/28/2015  . Leukopenia 12/08/2012  . Metatarsalgia of both feet 09/21/2011  . CERVICALGIA 05/04/2010  . Asymptomatic varicose veins 03/17/2009  . Osteoarthritis 03/17/2009  . SCOLIOSIS, MILD 03/17/2009    Silvestre Mesi 11/03/2020, 12:53 PM  [facility] [address] [place], [place], [address] Phone: [phone]   Fax:  [phone]  Name: Brittany [name] MRN: [id number] Date of Birth: 1956/04/05

## 2020-11-03 NOTE — Progress Notes (Signed)
I discussed the results of the MRI with Brittany [name].  She is just starting physical therapy through her orthopedist.  She is using gabapentin and baclofen as well as celecoxib for pain but the pain is still really not all that well controlled.  I have added a visit with me on January 26 which is her next Xgeva dose.

## 2020-11-04 ENCOUNTER — Ambulatory Visit (HOSPITAL_COMMUNITY): Payer: Medicare Other

## 2020-11-07 ENCOUNTER — Telehealth: Payer: Self-pay

## 2020-11-07 MED ORDER — TRAMADOL HCL 50 MG PO TABS: 50.0000 mg | ORAL_TABLET | Freq: Four times a day (QID) | ORAL | 0 refills | Status: DC | PRN

## 2020-11-07 NOTE — Telephone Encounter (Signed)
Her appt Monday is for a troch injection.

## 2020-11-07 NOTE — Telephone Encounter (Signed)
I called and advised the patient of the Tramadol Rx being sent to her pharmacy.

## 2020-11-07 NOTE — Telephone Encounter (Signed)
Patient called she stated she is in a lot of pain she is requesting a rx to be sent in to help with the pain until next appointment. [phone]

## 2020-11-07 NOTE — Telephone Encounter (Signed)
Tramadol Rx sent 

## 2020-11-07 NOTE — Addendum Note (Signed)
Addended by: Hortencia Pilar on: 11/07/2020 10:09 AM   Modules accepted: Orders

## 2020-11-10 ENCOUNTER — Encounter: Payer: Medicare Other | Admitting: Physical Therapy

## 2020-11-10 ENCOUNTER — Ambulatory Visit: Payer: Medicare Other | Admitting: Family Medicine

## 2020-11-12 ENCOUNTER — Ambulatory Visit: Payer: Self-pay

## 2020-11-12 ENCOUNTER — Other Ambulatory Visit: Payer: Self-pay

## 2020-11-12 ENCOUNTER — Ambulatory Visit (INDEPENDENT_AMBULATORY_CARE_PROVIDER_SITE_OTHER): Payer: Medicare Other | Admitting: Family Medicine

## 2020-11-12 ENCOUNTER — Encounter: Payer: Self-pay | Admitting: Family Medicine

## 2020-11-12 DIAGNOSIS — M25552 Pain in left hip: Secondary | ICD-10-CM | POA: Diagnosis not present

## 2020-11-12 MED ORDER — TRAMADOL HCL 50 MG PO TABS: 50.0000 mg | ORAL_TABLET | Freq: Four times a day (QID) | ORAL | 0 refills | Status: DC | PRN

## 2020-11-12 NOTE — Progress Notes (Signed)
I saw and examined the patient with Dr. Elouise Munroe and agree with assessment and plan as outlined.    Ongoing left hip pain.  MRI pelvis shows trochanteric bursitis bilaterally, but she has no tenderness there.  Pain is just superior to ischial tuberosity over sciatic nerve.  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    Will inject the ischial tuberosity today, and refer her to Dr. Ernestina Patches for lumbar ESI.  Refilled tramadol.

## 2020-11-12 NOTE — Progress Notes (Signed)
Office Visit Note   Patient: Brittany [name]           Date of Birth: 1955/12/20           MRN: [id number] Visit Date: 11/12/2020 Requested by: Binnie Rail, MD [place],  [place] [id number] PCP: Binnie Rail, MD  Subjective: Chief Complaint  Patient presents with  . Left Hip - Pain, Follow-up    Planned troch cortisone injection    HPI: 65yo F presenting to clinic with continued left posterior/lateral hip pain. Patient recently had MRI of pelvis and lumbar regions, that demonstrated both trochanteric bursitis, as well as lumbar foraminal stenosis, both of which could be contributing to symptoms. She continues to endorse pain running down the side/back of her leg which is severe enough to require tramadol on a daily basis. It also prevents her from playing golf, as she posts on the affected leg. She has had a hip replacement on this side, and says that she initially saw the hip doctor, who felt that her hip implant was still in good shape. She isn't sure what her next steps should be, but is very frustrated by her pain. She prefers to sleep on her left side, but this pain prevents her from doing so. She would like to try an injection today if this is possible.               ROS:   All other systems were reviewed and are negative.  Objective: Vital Signs: There were no vitals taken for this visit.  Physical Exam:  General:  Alert and oriented, in no acute distress. Pulm:  Breathing unlabored. Psy:  Normal mood, congruent affect. Skin:  Left gluteal region with no bruising or rashes. No erythema. Overlying skin intact.   MSK:  Normal gait No significant tenderness to palpation over left greater trochanteric area. Does have tenderness over superior aspect of ischial tuberosity, and within the belly of piriformis. Tenderness within sciatic notch.   Imaging: CLINICAL DATA:  Low back and bilateral hip pain. Metastatic breast cancer.  EXAM: MRI PELVIS WITHOUT AND  WITH CONTRAST  TECHNIQUE: Multiplanar multisequence MR imaging of the pelvis was performed both before and after administration of intravenous contrast.  CONTRAST:  63mL GADAVIST GADOBUTROL 1 MMOL/ML IV SOLN  COMPARISON:  Bone scan dated July 22, 2020. PET-CT dated January 11, 2020.  FINDINGS: Bones: Diffuse marrow heterogeneity throughout the pelvis, proximal femurs, and visualized lower lumbar spine with innumerable small sclerotic foci. There is no evidence of acute fracture, dislocation or avascular necrosis. Prior bilateral hip arthroplasties with associated susceptibility artifact. The visualized sacroiliac joints and symphysis pubis appear normal.  Joint or bursal effusion  Joint effusion: No significant hip joint effusion.  Bursae: Moderate amount of fluid in the right greater than left greater trochanteric bursas.  Muscles and tendons  Muscles and tendons: The visualized gluteus, hamstring and iliopsoas tendons appear normal. No muscle edema. Mild left greater than right gluteus minimus muscle atrophy.  Other findings  Miscellaneous: Mild sigmoid colonic diverticulosis.  IMPRESSION: 1. Widespread osseous metastatic disease. No pathologic fracture. 2. Bilateral total hip arthroplasties. Moderate bilateral greater trochanteric bursitis.   Electronically Signed   By: Titus Dubin M.D.   On: 11/01/2020 15:48   Assessment & Plan: 65yo F presenting to clinic with ongoing left posterior/lateral pelvic pain, in setting of MRI burisitis (bilateral on imaging, though asymptomatic on right), as well as L5-S1 foraminal stenosis. Examination as above, which was  not convincing for pain over greater trochanter. Given patient's degree of discomfort, she requested to try injection today.  -Ischial injection performed as described below, which patient tolerated very well. Strict return precautions were discussed - Will place referral to Dr Ernestina Patches for  consideration of ESI - Patient is happy with plan today.      Procedures: Left Ischial Cortisone Injection:  Risks and benefits of procedure discussed, Patient opted to proceed. Verbal Consent obtained.  Timeout performed.  Area of maximal tenderness was palpated within gluteal region. This was immediately superior to ischial tuberosity. Overlying skin prepped in a sterile fashion with betadine before further cleansing with alcohol. Ethyl Chloride was used for topical analgesia.  Area was injected with 3cc 0.25% Bupivocaine without epinephrine using a spinal needle, with ischial bony backdrop. Syringe was removed from the needle, and 6mg  betamethasone was then injected into region.  Patient tolerated the injection well with no immediate complications. Aftercare instructions were discussed, and patient was given strict return precautions.      PMFS History: Patient Active Problem List   Diagnosis Date Noted  . Lumbar radiculopathy 10/07/2020  . 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  . Malignant neoplasm of central portion of left breast in female, estrogen receptor positive ([place]) 11/22/2019  . Bone metastases ([place]) 11/22/2019  . Anatomical narrow angle glaucoma 09/25/2018  . History of gestational diabetes 09/21/2017  . Raynaud phenomenon 09/21/2016  . 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 Left rotator cuff tear 10/07/2015  . Hypothyroid 09/28/2015  . Leukopenia 12/08/2012  . Metatarsalgia of both feet 09/21/2011  . CERVICALGIA 05/04/2010  . Asymptomatic varicose veins 03/17/2009  . Osteoarthritis 03/17/2009  . SCOLIOSIS, MILD 03/17/2009   Past Medical History:  Diagnosis Date  . ANEMIA-NOS   . Asymptomatic varicose veins   . Breast cancer ([place])   . BREAST CANCER, HX OF 01/1995 dx   s/p Lumpectomy, XRT, chemo and 21yr armidex  . Gestational diabetes mellitus in childbirth, diet controlled 1985  . Hypothyroid 09/28/2015   Dx 09/2015  . Leukopenia     mild since chemo  . OSTEOARTHRITIS   . Personal history of chemotherapy   . Personal history of radiation therapy   . SCOLIOSIS, MILD     Family History  Problem Relation Age of Onset  . Heart disease Father 62       AMI  . Hyperlipidemia Father   . Valvular heart disease Father 43  . Parkinson's disease Father 33  . Arthritis Mother   . Breast cancer Mother 66  . Hyperlipidemia Mother   . Osteoporosis Mother   . Pancreatic cancer Maternal Grandmother 73       presumed dx  . Diabetes Paternal Grandfather        type 2  . Breast cancer Maternal Aunt     Past Surgical History:  Procedure Laterality Date  . BREAST BIOPSY    . BREAST LUMPECTOMY Left    1996  . left lumpectomy  1996  . TONSILLECTOMY    . TOTAL HIP ARTHROPLASTY     2002 & 2003-Dr. vail @ [place] History   Occupational History  . Not on file  Tobacco Use  . Smoking status: Former Smoker    Quit date: 06/25/1977    Years since quitting: 43.4  . Smokeless tobacco: Never Used  Substance and Sexual Activity  . Alcohol use: Yes    Alcohol/week: 3.0 - 4.0 standard drinks    Types:  3 - 4 Glasses of wine per week  . Drug use: No  . Sexual activity: Not on file

## 2020-11-13 ENCOUNTER — Ambulatory Visit (INDEPENDENT_AMBULATORY_CARE_PROVIDER_SITE_OTHER): Payer: Medicare Other | Admitting: Rehabilitative and Restorative Service Providers"

## 2020-11-13 ENCOUNTER — Encounter: Payer: Self-pay | Admitting: Rehabilitative and Restorative Service Providers"

## 2020-11-13 DIAGNOSIS — M5442 Lumbago with sciatica, left side: Secondary | ICD-10-CM | POA: Diagnosis not present

## 2020-11-13 DIAGNOSIS — M25552 Pain in left hip: Secondary | ICD-10-CM

## 2020-11-13 DIAGNOSIS — M6281 Muscle weakness (generalized): Secondary | ICD-10-CM | POA: Diagnosis not present

## 2020-11-13 DIAGNOSIS — R2689 Other abnormalities of gait and mobility: Secondary | ICD-10-CM

## 2020-11-13 NOTE — Therapy (Signed)
[place], [place], [address] Phone: [phone]   Fax:  [phone]  Physical Therapy Treatment  Patient Details  Name: Brittany [name] MRN: [id number] Date of Birth: 07-08-1956 Referring Provider (PT): Eunice Blase, MD   Encounter Date: 11/13/2020   PT End of Session - 11/13/20 0916    Visit Number 3    Number of Visits 12    Date for PT Re-Evaluation 12/09/20    Authorization Type MCR/BCBS    Progress Note Due on Visit 10    PT Start Time 0920    PT Stop Time 1000    PT Time Calculation (min) 40 min    Activity Tolerance Patient tolerated treatment well    Behavior During Therapy [facility] for tasks assessed/performed           Past Medical History:  Diagnosis Date  . ANEMIA-NOS   . Asymptomatic varicose veins   . Breast cancer ([place])   . BREAST CANCER, HX OF 01/1995 dx   s/p Lumpectomy, XRT, chemo and 76yr armidex  . Gestational diabetes mellitus in childbirth, diet controlled 1985  . Hypothyroid 09/28/2015   Dx 09/2015  . Leukopenia    mild since chemo  . OSTEOARTHRITIS   . Personal history of chemotherapy   . Personal history of radiation therapy   . SCOLIOSIS, MILD     Past Surgical History:  Procedure Laterality Date  . BREAST BIOPSY    . BREAST LUMPECTOMY Left    1996  . left lumpectomy  1996  . TONSILLECTOMY    . TOTAL HIP ARTHROPLASTY     2002 & 2003-Dr. vail @ [facility]    There were no vitals filed for this visit.   Subjective Assessment - 11/13/20 0921    Subjective Pt. indicated injection seemed to help a bit in posterior superior hip.  Pt. stated feeling tenderness to touch in posterior/lateral hip, upper thigh.  Pt. stated limited in walking.    Pertinent History bilat THA, She does have a history of metastatic breast cancer.  She had a bone scan in September showing stable lesions throughout the spine.    Limitations Lifting;Standing;Walking    Diagnostic tests "Lumbar x-rays reveal  slight anterolisthesis of L5 on S1 with facet arthropathy at that level and to a milder degree in the upper levels.  The hip prosthesis looks intact with good alignment, no sign of loosening. "    Currently in Pain? Yes    Pain Location Back   Lt hip   Pain Orientation Left    Pain Onset More than a month ago    Aggravating Factors  walking, recreational activity              [facility] PT Assessment - 11/13/20 0001      AROM   Lumbar Flexion to floor : reduced pain produced x extension movement prior to flexion    Lumbar Extension 50% ERP Lt lumbar, Lt lateral hip pain indicated.  REIS x 5 pain produced, same locations, stronger at rest after exercise                         [facility] Adult PT Treatment/Exercise - 11/13/20 0001      Lumbar Exercises: Stretches   Other Lumbar Stretch Exercise single knee to opposite shoulder 15 sec x 3      Lumbar Exercises: Standing   Other Standing Lumbar Exercises repeated ext x 5, flexion x 5 (  centralization c flexion)      Manual Therapy   Manual therapy comments compression to Lt glute med/min            Trigger Point Dry Needling - 11/13/20 0001    Consent Given? Yes    Education Handout Provided Yes    Muscles Treated Back/Hip Gluteus medius   Lt   Gluteus Medius Response Twitch response elicited                PT Education - 11/13/20 0946    Education Details DN education, HEP review    Person(s) Educated Patient    Methods Explanation;Verbal cues;Handout    Comprehension Verbalized understanding;Returned demonstration            PT Short Term Goals - 11/13/20 0947      PT SHORT TERM GOAL #1   Title She will be independent with inital HEP     Baseline no HEP until today    Time 4    Period Weeks    Status Achieved    Target Date 11/11/20             PT Long Term Goals - 10/14/20 1332      PT LONG TERM GOAL #1   Title She will improve FOTO score to predicted value once has been completed we  will know the value.    Time 8    Period Weeks    Status New    Target Date 12/09/20      PT LONG TERM GOAL #2   Title She will report pain improved to no more than 3/10 with ususal activity.    Baseline 9/10 pain    Time 8    Period Weeks    Status New    Target Date 12/09/20      PT LONG TERM GOAL #3   Title She will be able to return to light golf and tennis    Time 8    Period Weeks    Status New      PT LONG TERM GOAL #4   Title Lumbar ROM improved to [facility] with minimal amount of pain    Time 8    Period Weeks    Status New                 Plan - 11/13/20 0947    Clinical Impression Statement Assessment of movement today showed ERP lumbar, peripherialization into LE c extnsion, centralization c flexion based movement.  Also demonstrated localized Lt lateral hip symptoms c TrP in glute med/min region.  Recommend flexion based movement c reassessment of trigger point release intervention.    Examination-Activity Limitations Bend;Sleep;Squat;Stairs;Stand;Lift    Examination-Participation Restrictions Cleaning;Community Activity;Shop;Other    Stability/Clinical Decision Making Stable/Uncomplicated    Rehab Potential Good    PT Frequency 2x / week    PT Duration 8 weeks    PT Treatment/Interventions ADLs/Self Care Home Management;Cryotherapy;Electrical Stimulation;Iontophoresis 4mg /ml Dexamethasone;Moist Heat;Traction;Ultrasound;Therapeutic activities;Therapeutic exercise;Balance training;Neuromuscular re-education;Manual techniques;Passive range of motion;Dry needling;Joint Manipulations;Spinal Manipulations;Taping    PT Next Visit Plan Flexion based movements, hip strengthening, DN again?    PT Home Exercise Plan Access Code: [email or web address]    Consulted and Agree with Plan of Care Patient           Patient will benefit from skilled therapeutic intervention in order to improve the following deficits and impairments:  Decreased activity tolerance,Decreased  balance,Decreased endurance,Decreased range of motion,Decreased strength,Increased edema,Hypomobility,Difficulty walking,Impaired flexibility,Postural dysfunction,Pain  Visit Diagnosis:  Acute left-sided low back pain with left-sided sciatica  Pain in left hip  Muscle weakness (generalized)  Other abnormalities of gait and mobility     Problem List Patient Active Problem List   Diagnosis Date Noted  . Lumbar radiculopathy 10/07/2020  . 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  . 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 Bone metastases ([place]) 11/22/2019  . Anatomical narrow angle glaucoma 09/25/2018  . History of gestational diabetes 09/21/2017  . Raynaud phenomenon 09/21/2016  . 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 Left rotator cuff tear 10/07/2015  . Hypothyroid 09/28/2015  . Leukopenia 12/08/2012  . Metatarsalgia of both feet 09/21/2011  . CERVICALGIA 05/04/2010  . Asymptomatic varicose veins 03/17/2009  . Osteoarthritis 03/17/2009  . SCOLIOSIS, MILD 03/17/2009    Scot Jun, PT, DPT, OCS, ATC 11/13/20  9:56 AM    [facility] [address] [place], [place], [address] Phone: [phone]   Fax:  [phone]  Name: Brittany [name] MRN: [id number] Date of Birth: 09/06/1956

## 2020-11-17 ENCOUNTER — Other Ambulatory Visit: Payer: Self-pay

## 2020-11-17 ENCOUNTER — Ambulatory Visit (INDEPENDENT_AMBULATORY_CARE_PROVIDER_SITE_OTHER): Payer: Medicare Other | Admitting: Physical Therapy

## 2020-11-17 ENCOUNTER — Encounter: Payer: Self-pay | Admitting: Physical Therapy

## 2020-11-17 DIAGNOSIS — R2689 Other abnormalities of gait and mobility: Secondary | ICD-10-CM | POA: Diagnosis not present

## 2020-11-17 DIAGNOSIS — M6281 Muscle weakness (generalized): Secondary | ICD-10-CM | POA: Diagnosis not present

## 2020-11-17 DIAGNOSIS — M5442 Lumbago with sciatica, left side: Secondary | ICD-10-CM | POA: Diagnosis not present

## 2020-11-17 DIAGNOSIS — M25552 Pain in left hip: Secondary | ICD-10-CM | POA: Diagnosis not present

## 2020-11-17 NOTE — Patient Instructions (Signed)
Access Code: [email or web address] URL: [email or web address] Date: 11/17/2020 Prepared by: Elsie Ra  Exercises Seated Lumbar Flexion Stretch - 2 x daily - 6 x weekly - 10 reps - 1 sets - 10 hold Supine Figure 4 Piriformis Stretch - 2 x daily - 6 x weekly - 1 sets - 3 reps - 30 hold Sidelying Reverse Clamshell - 2 x daily - 6 x weekly - 10 reps - 1-2 sets Beginner Clam - 2 x daily - 6 x weekly - 1-2 sets - 10 reps Sidelying Feet Elevated Clamshells - 2 x daily - 6 x weekly - 1-2 sets - 10 reps Sidestepping - 2 x daily - 6 x weekly - 1-2 sets - 10 reps Standing Hip Hiking - 2 x daily - 6 x weekly - 1-2 sets - 10 reps

## 2020-11-17 NOTE — Therapy (Signed)
[facility] [place], [place], [address] Phone: [phone]   Fax:  [phone]  Physical Therapy Treatment  Patient Details  Name: Brittany [name] MRN: [id number] Date of Birth: 12/07/1955 Referring Provider (PT): Eunice Blase, MD   Encounter Date: 11/17/2020   PT End of Session - 11/17/20 1434    Visit Number 4    Number of Visits 12    Date for PT Re-Evaluation 12/09/20    Authorization Type MCR/BCBS    Progress Note Due on Visit 10    PT Start Time 1300    PT Stop Time 1350    PT Time Calculation (min) 50 min    Activity Tolerance Patient tolerated treatment well    Behavior During Therapy [facility] for tasks assessed/performed           Past Medical History:  Diagnosis Date  . ANEMIA-NOS   . Asymptomatic varicose veins   . Breast cancer ([place])   . BREAST CANCER, HX OF 01/1995 dx   s/p Lumpectomy, XRT, chemo and 61yr armidex  . Gestational diabetes mellitus in childbirth, diet controlled 1985  . Hypothyroid 09/28/2015   Dx 09/2015  . Leukopenia    mild since chemo  . OSTEOARTHRITIS   . Personal history of chemotherapy   . Personal history of radiation therapy   . SCOLIOSIS, MILD     Past Surgical History:  Procedure Laterality Date  . BREAST BIOPSY    . BREAST LUMPECTOMY Left    1996  . left lumpectomy  1996  . TONSILLECTOMY    . TOTAL HIP ARTHROPLASTY     2002 & 2003-Dr. vail @ [facility]    There were no vitals filed for this visit.   Subjective Assessment - 11/17/20 1423    Subjective She relays the DN last session provided about 8 hours of relief and she was able to stand and walk and do errands afer. Then the pain returned and is about the same 7/10.    Pertinent History bilat THA, She does have a history of metastatic breast cancer.  She had a bone scan in September showing stable lesions throughout the spine.    Limitations Lifting;Standing;Walking    Diagnostic tests "Lumbar x-rays reveal slight  anterolisthesis of L5 on S1 with facet arthropathy at that level and to a milder degree in the upper levels.  The hip prosthesis looks intact with good alignment, no sign of loosening. "    Pain Onset More than a month ago           [facility] Adult PT Treatment/Exercise - 11/17/20 0001      Lumbar Exercises: Stretches   Other Lumbar Stretch Exercise single knee to opposite shoulder 15 sec x 3      Lumbar Exercises: Aerobic   Recumbent Bike 8 min L3      Lumbar Exercises: Standing   Other Standing Lumbar Exercises lateral walking with mini squat 5 steps X 5 reps bilat, hip hike X 5 reps bilat      Lumbar Exercises: Supine   Pelvic Tilt 10 reps;5 seconds    Bridge Limitations mini bridge X 10 reps      Lumbar Exercises: Sidelying   Clam Left;15 reps    Other Sidelying Lumbar Exercises reveres clam on Lt X15 reps      Manual Therapy   Manual therapy comments compression to Lt glute med/min            Trigger Point Dry Needling -  11/17/20 0001    Consent Given? Yes    Education Handout Provided Previously provided    Muscles Treated Back/Hip Gluteus medius    Gluteus Medius Response Twitch response elicited                  PT Short Term Goals - 11/13/20 0947      PT SHORT TERM GOAL #1   Title She will be independent with inital HEP     Baseline no HEP until today    Time 4    Period Weeks    Status Achieved    Target Date 11/11/20             PT Long Term Goals - 10/14/20 1332      PT LONG TERM GOAL #1   Title She will improve FOTO score to predicted value once has been completed we will know the value.    Time 8    Period Weeks    Status New    Target Date 12/09/20      PT LONG TERM GOAL #2   Title She will report pain improved to no more than 3/10 with ususal activity.    Baseline 9/10 pain    Time 8    Period Weeks    Status New    Target Date 12/09/20      PT LONG TERM GOAL #3   Title She will be able to return to light golf and tennis     Time 8    Period Weeks    Status New      PT LONG TERM GOAL #4   Title Lumbar ROM improved to [facility] with minimal amount of pain    Time 8    Period Weeks    Status New                 Plan - 11/17/20 1438    Clinical Impression Statement Continued with flexion based program and DN combined with glute strengthening as she still has significant triggerpoints and weakness in her left glutes. Showed her self triggerpoint release techniques with tennis ball and foam roller for home.    Examination-Activity Limitations Bend;Sleep;Squat;Stairs;Stand;Lift    Examination-Participation Restrictions Cleaning;Community Activity;Shop;Other    Stability/Clinical Decision Making Stable/Uncomplicated    Rehab Potential Good    PT Frequency 2x / week    PT Duration 8 weeks    PT Treatment/Interventions ADLs/Self Care Home Management;Cryotherapy;Electrical Stimulation;Iontophoresis 4mg /ml Dexamethasone;Moist Heat;Traction;Ultrasound;Therapeutic activities;Therapeutic exercise;Balance training;Neuromuscular re-education;Manual techniques;Passive range of motion;Dry needling;Joint Manipulations;Spinal Manipulations;Taping    PT Next Visit Plan Flexion based movements, hip strengthening, DN again?    PT Home Exercise Plan Access Code: [email or web address]    Consulted and Agree with Plan of Care Patient           Patient will benefit from skilled therapeutic intervention in order to improve the following deficits and impairments:  Decreased activity tolerance,Decreased balance,Decreased endurance,Decreased range of motion,Decreased strength,Increased edema,Hypomobility,Difficulty walking,Impaired flexibility,Postural dysfunction,Pain  Visit Diagnosis: Acute left-sided low back pain with left-sided sciatica  Pain in left hip  Muscle weakness (generalized)  Other abnormalities of gait and mobility     Problem List Patient Active Problem List   Diagnosis Date Noted  . Lumbar radiculopathy 10/07/2020   . 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  . Malignant neoplasm of central portion of left breast in female, estrogen receptor positive ([place]) 11/22/2019  . Bone metastases ([place]) 11/22/2019  . Anatomical narrow angle glaucoma 09/25/2018  . History of gestational  diabetes 09/21/2017  . Raynaud phenomenon 09/21/2016  . 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 Left rotator cuff tear 10/07/2015  . Hypothyroid 09/28/2015  . Leukopenia 12/08/2012  . Metatarsalgia of both feet 09/21/2011  . CERVICALGIA 05/04/2010  . Asymptomatic varicose veins 03/17/2009  . Osteoarthritis 03/17/2009  . SCOLIOSIS, MILD 03/17/2009    Debbe Odea ,PT,DPT 11/17/2020, 2:39 PM  [facility] [address] [place], [place], [address] Phone: [phone]   Fax:  [phone]  Name: Brittany [name] MRN: [id number] Date of Birth: 05-16-1956

## 2020-11-18 NOTE — Progress Notes (Signed)
[place]  [phone] Fax:([phone]) [phone]     ID: Brittany [name] DOB: 03/19/1956  MR#: [id number]  QIW#:[id number]  Patient Care Team: Binnie Rail, MD as PCP - General (Internal Medicine) Aloha Gell, MD as Consulting Physician (Obstetrics and Gynecology) Sydnee Levans, MD (Dermatology) Billie Trager, Virgie Dad, MD as Consulting Physician (Oncology) Richmond Campbell, MD as Consulting Physician (Gastroenterology) Stefanie Libel, MD as Consulting Physician (Sports Medicine) Chauncey Cruel, MD OTHER MD:   CHIEF COMPLAINT: Stage IV lobular breast cancer  CURRENT TREATMENT: Delton See; capecitabine   INTERVAL HISTORY: Brittany [name] returns today for follow up of her stage IV lobular breast cancer.   Since her last visit, she underwent total spine MRI on 11/01/2020 showing: diffuse osseous metastatic disease without significant epidural disease, recent compression fracture, or evidence of leptomeningeal disease.  Pelvis MRI was also performed the same day showing: widespread osseous metastatic disease with no pathologic fracture.  She was restarted on capecitabine at an even lower dose, 1000 mg in the morning only, on 03/25/2020.  She has no mouth sores or diarrhea.  She has no significant palmar plantar erythrodysesthesia.  She does have some cracking in her fingertips and is applying urea cream to her hands and feet.    She began [place] on 12/13/2019.  She is tolerating this with no side effects that she is aware of.  We are following her tumor markers Lab Results  Component Value Date   CA2729 37.4 10/22/2020   CA2729 38.4 09/24/2020   CA2729 40.5 (H) 08/27/2020   CA2729 46.5 (H) 07/30/2020   CA2729 55.7 (H) 07/02/2020   Lab Results  Component Value Date   CEA1 5.39 (H) 11/19/2020   CEA1 6.17 (H) 10/22/2020   CEA1 5.74 (H) 09/24/2020   CEA1 5.33 (H) 08/27/2020   CEA1 6.21 (H) 07/30/2020    REVIEW OF SYSTEMS: Brittany's pain is not well controlled.  It is  mostly hip, left greater than right.  She is currently on tramadol which she takes 3 times a day.  This did constipate her and she started Dulcolax for that.  She also takes baclofen, gabapentin, and uses ice.  She does not feel she can walk around the block or walk her dog.  She is planning to travel to [place] to spend time with her children there.  She will be back by the middle of next week she says.   COVID 19 VACCINATION STATUS: Status post Pfizer x2 with booster August 2021   HISTORY OF CURRENT ILLNESS: From the original intake note:  "Brittany" [name] has a history of left breast cancer dating back to 72.  She was followed by Dr. Beryle Beams and underwent lumpectomy, adjuvant chemotherapy, radiation and "hormonal therapy".  Per Brittany's recollection the tumor was less than 2 cm, 2 out of 10 lymph nodes were involved, and it was a lobular breast cancer.  She took tamoxifen for many years and then raloxifene.  Dr. Beryle Beams also evaluated her for leukopenia which was noted at first in 2011, worked up with a negative ANA and negative rheumatoid factors.  He felt it was a benign residual from her earlier chemotherapy.  She was released from follow-up in 2015.  She has continued on intensified screening because of her family history.  On November 23, 2017 she had bilateral breast MRIs which were unremarkable.  Mammography November 20, 2018 showed no findings suspicious of malignancy.  Breast MRI 10/30/2019 showed a new irregular 0.4 cm enhancing focus in the upper inner  quadrant of the right breast.  The left breast continued to be unremarkable except for postoperative changes and there were no abnormal appearing lymph nodes.  However in that same MRI multiple areas of enhancement were noted in the sternum.  This had not been seen in the prior MRI.  Plan right MRI biopsy 11/16/2019 was canceled as the previously demonstrated 0.4 cm focus in the right breast showed only a tortuous vein at that  location.  However bone scan 11/19/2019 obtained to follow-up on the sternal findings showed additional areas of concern at T12, T7-T11, L4 and ribs, as well as the sternum.  The patient's subsequent history is as detailed below.   PAST MEDICAL HISTORY: Past Medical History:  Diagnosis Date  . ANEMIA-NOS   . Asymptomatic varicose veins   . Breast cancer ([place])   . BREAST CANCER, HX OF 01/1995 dx   s/p Lumpectomy, XRT, chemo and 67yrarmidex  . Gestational diabetes mellitus in childbirth, diet controlled 1985  . Hypothyroid 09/28/2015   Dx 09/2015  . Leukopenia    mild since chemo  . OSTEOARTHRITIS   . Personal history of chemotherapy   . Personal history of radiation therapy   . SCOLIOSIS, MILD     PAST SURGICAL HISTORY: Past Surgical History:  Procedure Laterality Date  . BREAST BIOPSY    . BREAST LUMPECTOMY Left    1996  . left lumpectomy  1996  . TONSILLECTOMY    . TOTAL HIP ARTHROPLASTY     2002 & 2003-Dr. vail @ [facility]    FAMILY HISTORY Family History  Problem Relation Age of Onset  . Heart disease Father 544      AMI  . Hyperlipidemia Father   . Valvular heart disease Father 779 . Parkinson's disease Father 778 . Arthritis Mother   . Breast cancer Mother 553 . Hyperlipidemia Mother   . Osteoporosis Mother   . Pancreatic cancer Maternal Grandmother 73       presumed dx  . Diabetes Paternal Grandfather        type 2  . Breast cancer Maternal Aunt   The patient's father died at the age of 850from heart disease.  He had Lewy body dementia.  The patient's mother is 867years old as of January 2021.  She is in a memory unit.  She has a history of breast cancer diagnosed in her 512s(ductal carcinoma in situ).  The patient's mother sister had breast cancer diagnosed around age 67  The patient's mother's mother also had cancer, "internal" (possibly ovarian or gastrointestinal).  There is no cancer on the father's side of the family.   GYNECOLOGIC HISTORY:  No LMP  recorded. Patient is postmenopausal. Menarche: 65years old Age at first live birth: 65years old GDierksP 2 LMP with chemotherapy, in 1996 Contraceptive a little over a year, with no complications HRT no  Hysterectomy?  No Salpingo-oophorectomy?  No   SOCIAL HISTORY:  [place]uated from [place] a degree in psychology and IT.  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  She and her husband JClair Gullingmet while working for [employer]  [place] probably [place] exposed to some [place] at that time.  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  They both finally retired 03/28/2020.  Their daughter JAnderson Maltais a hPharmacist, hospitalin [place]  Their  son Jenny Reichmann works for the same [employer] that the patient works for.  Note that Jenny Reichmann did the [employer].  The patient has 1 grandchild (in [place]).  The patient attends [employer]: The patient's husband is her healthcare power of attorney   HEALTH MAINTENANCE: Social History   Tobacco Use  . Smoking status: Former Smoker    Quit date: 06/25/1977    Years since quitting: 43.4  . Smokeless tobacco: Never Used  Substance Use Topics  . Alcohol use: Yes    Alcohol/week: 3.0 - 4.0 standard drinks    Types: 3 - 4 Glasses of wine per week  . Drug use: No     Colonoscopy: 2015  PAP: Up-to-date  Bone density: 11/20/2018, normal   No Known Allergies  Current Outpatient Medications  Medication Sig Dispense Refill  . baclofen (LIORESAL) 10 MG tablet Take 1 tablet (10 mg total) by mouth 3 (three) times daily. 30 each 0  . baclofen (LIORESAL) 20 MG tablet Take 1 tablet (20 mg total) by mouth 3 (three) times daily as needed for muscle spasms. 90 tablet 3  . Calcium Citrate 250 MG TABS Take 4 tablets (1,000 mg total) by mouth daily.  0  . capecitabine (XELODA) 500 MG tablet Take 2 tablets (1,000 mg total) by  mouth every morning. 120 tablet 6  . celecoxib (CELEBREX) 200 MG capsule Take 1 capsule (200 mg total) by mouth 2 (two) times daily as needed. 60 capsule 6  . Cholecalciferol (VITAMIN D3) 1000 UNITS CAPS Take by mouth daily.    . Denosumab (XGEVA [place]) Inject into the skin.    Marland Kitchen diclofenac sodium (VOLTAREN) 1 % GEL Apply 2 g topically 3 (three) times daily as needed. 100 g 1  . gabapentin (NEURONTIN) 300 MG capsule Take 1 capsule (300 mg total) by mouth at bedtime. 90 capsule 1  . levothyroxine (SYNTHROID) 50 MCG tablet TAKE 1 TABLET BY MOUTH EVERY DAY 90 tablet 3  . Multiple Vitamin (MULTIVITAMIN) tablet Take 1 tablet by mouth daily.    . Omega-3 Fatty Acids (FISH OIL) 1000 MG CPDR Take by mouth daily.    . traMADol (ULTRAM) 50 MG tablet Take 1 tablet (50 mg total) by mouth every 6 (six) hours as needed. 30 tablet 0   No current facility-administered medications for this visit.    OBJECTIVE:  white woman who appears stated age  65:   11/19/20 1218  BP: 127/78  Pulse: 79  Resp: 18  Temp: 98.1 F (36.7 C)  SpO2: 97%     Body mass index is 21.93 kg/m.   Wt Readings from Last 3 Encounters:  11/19/20 140 lb (63.5 kg)  10/22/20 142 lb 11.2 oz (64.7 kg)  10/07/20 144 lb (65.3 kg)      ECOG FS:1 - Symptomatic but completely ambulatory  Sclerae unicteric, EOMs intact Wearing a mask No cervical or supraclavicular adenopathy Lungs no rales or rhonchi Heart regular rate and rhythm Abd soft, nontender, positive bowel sounds MSK mild kyphosis, no upper extremity lymphedema; limited range of motion particularly left lower extremity secondary to pain Neuro: nonfocal, well oriented, appropriate affect Breasts: The right breast is unremarkable.  The left breast has undergone lumpectomy followed by radiation.  There is no evidence of local recurrence. Skin: Some peeling at the finger tips, grade 1 palmar plantar erythrodysesthesia   LAB RESULTS:  CMP     Component Value Date/Time    NA 130 (L)  11/19/2020 1139   NA 139 12/10/2013 0841   K 5.2 (H) 11/19/2020 1139   K 4.5 12/10/2013 0841   CL 97 (L) 11/19/2020 1139   CL 102 12/01/2012 0955   CO2 27 11/19/2020 1139   CO2 28 12/10/2013 0841   GLUCOSE 90 11/19/2020 1139   GLUCOSE 81 12/10/2013 0841   GLUCOSE 88 12/01/2012 0955   BUN 12 11/19/2020 1139   BUN 14 08/04/2015 0000   BUN 17.5 12/10/2013 0841   CREATININE 0.88 11/19/2020 1139   CREATININE 0.9 12/10/2013 0841   CALCIUM 9.0 11/19/2020 1139   CALCIUM 9.6 12/10/2013 0841   PROT 6.9 11/19/2020 1139   PROT 6.7 12/10/2013 0841   ALBUMIN 3.8 11/19/2020 1139   ALBUMIN 3.9 12/10/2013 0841   AST 30 11/19/2020 1139   AST 22 12/10/2013 0841   ALT 22 11/19/2020 1139   ALT 27 12/10/2013 0841   ALKPHOS 56 11/19/2020 1139   ALKPHOS 54 12/10/2013 0841   BILITOT 0.3 11/19/2020 1139   BILITOT 0.41 12/10/2013 0841   GFRNONAA >60 11/19/2020 1139   GFRAA >60 07/02/2020 1324    No results found for: TOTALPROTELP, ALBUMINELP, A1GS, A2GS, BETS, BETA2SER, GAMS, MSPIKE, SPEI  No results found for: Nils Pyle, [facility]  Lab Results  Component Value Date   WBC 5.1 11/19/2020   NEUTROABS 2.5 11/19/2020   HGB 11.6 (L) 11/19/2020   HCT 34.1 (L) 11/19/2020   MCV 104.3 (H) 11/19/2020   PLT 302 11/19/2020      Chemistry      Component Value Date/Time   NA 130 (L) 11/19/2020 1139   NA 139 12/10/2013 0841   K 5.2 (H) 11/19/2020 1139   K 4.5 12/10/2013 0841   CL 97 (L) 11/19/2020 1139   CL 102 12/01/2012 0955   CO2 27 11/19/2020 1139   CO2 28 12/10/2013 0841   BUN 12 11/19/2020 1139   BUN 14 08/04/2015 0000   BUN 17.5 12/10/2013 0841   CREATININE 0.88 11/19/2020 1139   CREATININE 0.9 12/10/2013 0841   GLU 102 08/04/2015 0000      Component Value Date/Time   CALCIUM 9.0 11/19/2020 1139   CALCIUM 9.6 12/10/2013 0841   ALKPHOS 56 11/19/2020 1139   ALKPHOS 54 12/10/2013 0841   AST 30 11/19/2020 1139   AST 22 12/10/2013 0841   ALT 22 11/19/2020  1139   ALT 27 12/10/2013 0841   BILITOT 0.3 11/19/2020 1139   BILITOT 0.41 12/10/2013 0841      No results found for: LABCA2  No components found for: [id number]  No results for input(s): INR in the last 168 hours.  No results found for: LABCA2  No results found for: CAN199  Lab Results  Component Value Date   WUJ811 21.1 11/23/2019    No results found for: BJY782  Lab Results  Component Value Date   CA2729 37.4 10/22/2020    No components found for: HGQUANT  Lab Results  Component Value Date   CEA1 5.39 (H) 11/19/2020   /  CEA ([facility])  Date Value Ref Range Status  11/19/2020 5.39 (H) 0.00 - 5.00 ng/mL Final    Comment:    (NOTE) This test was performed using Architect's Chemiluminescent Microparticle Immunoassay. Values obtained from different assay methods cannot be used interchangeably. Please note that 5-10% of patients who smoke may see CEA levels up to 6.9 ng/mL. Performed at [facility], [place] [address], [place], [place] [address]  No results found for: AFPTUMOR  No results found for: CHROMOGRNA   No results found for: HGBA, HGBA2QUANT, HGBFQUANT, HGBSQUAN (Hemoglobinopathy evaluation)   Lab Results  Component Value Date   LDH 193 12/10/2013    No results found for: IRON, TIBC, IRONPCTSAT (Iron and TIBC)  No results found for: FERRITIN  Urinalysis    Component Value Date/Time   COLORURINE YELLOW 09/17/2014 [place] 09/17/2014 0824   LABSPEC 1.015 09/17/2014 0824   PHURINE 7.5 09/17/2014 [place] 09/17/2014 0824   HGBUR NEGATIVE 09/17/2014 0824   BILIRUBINUR NEGATIVE 09/17/2014 0824   KETONESUR NEGATIVE 09/17/2014 0824   UROBILINOGEN 0.2 09/17/2014 0824   NITRITE NEGATIVE 09/17/2014 0824   LEUKOCYTESUR NEGATIVE 09/17/2014 0824    STUDIES: MR Pelvis W Wo Contrast  Result Date: 11/01/2020 CLINICAL DATA:  Low back and bilateral hip pain. Metastatic breast cancer.  EXAM: MRI PELVIS WITHOUT AND WITH CONTRAST TECHNIQUE: Multiplanar multisequence MR imaging of the pelvis was performed both before and after administration of intravenous contrast. CONTRAST:  44m GADAVIST GADOBUTROL 1 MMOL/ML IV SOLN COMPARISON:  Bone scan dated July 22, 2020. PET-CT dated January 11, 2020. FINDINGS: Bones: Diffuse marrow heterogeneity throughout the pelvis, proximal femurs, and visualized lower lumbar spine with innumerable small sclerotic foci. There is no evidence of acute fracture, dislocation or avascular necrosis. Prior bilateral hip arthroplasties with associated susceptibility artifact. The visualized sacroiliac joints and symphysis pubis appear normal. Joint or bursal effusion Joint effusion: No significant hip joint effusion. Bursae: Moderate amount of fluid in the right greater than left greater trochanteric bursas. Muscles and tendons Muscles and tendons: The visualized gluteus, hamstring and iliopsoas tendons appear normal. No muscle edema. Mild left greater than right gluteus minimus muscle atrophy. Other findings Miscellaneous: Mild sigmoid colonic diverticulosis. IMPRESSION: 1. Widespread osseous metastatic disease. No pathologic fracture. 2. Bilateral total hip arthroplasties. Moderate bilateral greater trochanteric bursitis. Electronically Signed   By: WTitus DubinM.D.   On: 11/01/2020 15:48   MR TOTAL SPINE METS SCREENING  Result Date: 11/01/2020 CLINICAL DATA:  Metastatic breast cancer with known bone metastases EXAM: MRI TOTAL SPINE WITHOUT AND WITH CONTRAST TECHNIQUE: Multisequence MR imaging of the spine from the cervical spine to the sacrum was performed prior to and following IV contrast administration for evaluation of spinal metastatic disease. CONTRAST:  6 mL Gadavist COMPARISON:  None. FINDINGS: Motion artifact is present. Full spine imaging obtained in upper and lower stations. Lower station axial images do not localize to sagittal images MRI CERVICAL SPINE  Alignment: Mild retrolisthesis at C5-C6. Vertebrae: Diffuse abnormal signal with areas of low T1 and T2 signal. There is no significant epidural disease. Cord: Normal signal.  No abnormal intrathecal enhancement. Posterior Fossa, vertebral arteries, paraspinal tissues: No abnormal enhancement. Disc levels: Multilevel degenerative disc disease. There are disc osteophyte complexes at C5-C6 and C6-C7. Moderate canal stenosis at C5-C6. Multilevel facet and uncovertebral hypertrophy. Foraminal narrowing also greatest at C5-C6 and C6-C7. MRI THORACIC SPINE Alignment:  No significant listhesis. Vertebrae: Diffuse abnormal signal with heterogeneous areas of low T1 and T2 signal reflecting sclerosis and foci of enhancement. There is no significant epidural disease. Cord:  No abnormal cord signal. Paraspinal and other soft tissues: Unremarkable. Disc levels: Multilevel degenerative disc disease. Most notably, there is a central disc protrusion at T5-T6 indenting the ventral aspect of the cord. No high-grade canal or foraminal stenosis identified. MRI LUMBAR SPINE Segmentation: Transitional anatomy at the lumbosacral junction when counting from above and assuming  12 rib-bearing thoracic vertebral bodies. For the purposes of this dictation, there is a lumbarized S1. Alignment:  No significant listhesis. Vertebrae: Diffuse abnormal signal with areas of low T1 and T2 signal reflecting sclerosis and foci of enhancement. There is no significant epidural disease. Conus medullaris: Extends to the L2 level and appears normal. No abnormal intrathecal enhancement. Paraspinal and other soft tissues: Unremarkable. Disc levels: Multilevel degenerative disc disease with disc bulges and endplate osteophytes. Multilevel facet arthropathy, greatest at L5-S1 where there are joint effusions and extra-spinal synovial cysts. No high-grade canal stenosis identified. Foraminal narrowing is greatest on the left at L5-S1. IMPRESSION: Motion degraded  study. Diffuse osseous metastatic disease. No significant epidural disease. No recent compression fracture. No evidence of leptomeningeal disease. Electronically Signed   By: Macy Mis M.D.   On: 11/01/2020 16:14     ELIGIBLE FOR AVAILABLE RESEARCH PROTOCOL: no  ASSESSMENT: 65 y.o. [place] woman status post left lumpectomy 1996 for a T1 N1, anatomic stage II invasive lobular breast cancer, estrogen receptor positive  (a) s/p adjuvant chemotherapy with doxorubicin and cyclophosphamide x4  (b) status post adjuvant radiation  (c) status post tamoxifen for 5+ years  (1) benign leukopenia: noted 2011, felt secondary to prior chemotherapy  (2) genetics testing 06/23/2018 through [facility] MultiCancer panel found no deleterious mutations in the 84 genes tested including BRCA 1-2  (a) 2 variants of uncertain significance were fund in [place] [c.1037C>T (p.Ser346Phe) and c.503C>G (p.ALA168Gly)  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, 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 (3) staging studies:  (a) breast MRI 10/30/2019 shows sternal mottling  (b) bone scan 11/19/2019 shows multiple areas of uptake suspicious for metastatic disease  (c) CT of the chest abdomen and pelvis 11/28/2019 shows no visceral disease  (d) tumor markers 11/23/2019 shows a CEA of 18.11, CA 27-29 of 61.2, CA 125 of 21.1  (e) F-18-estradiol PET (cerianna) 01/11/2020 shows multiple sclerotic bone lesions which do not accumulate the estrogen receptor specific radiotracer  (f) bone marrow biopsy 01/25/2020 confirms metastatic breast cancer, gross cystic disease fluid protein positive, but estrogen and progesterone receptor negative.  HER-2/neu was 2+ on immunohistochemistry negative by FISH (1.35/1.70).  (g) CARIS confirms triple negative disease, finds a positive androgen receptor at 3+, 70%; and the PIK3CA was indeterminate.  PTEN was positive by IHC at 90%.  PD-L1 was negative.  Genomic LOH was low and MSI was stable with proficient mismatch  mismatch repair.  BRCA 1 and 2 were negative.  (4) denosumab/Xgeva started 12/13/2019, repeated monthly  (5) metronomic capecitabine started 02/25/2020, with significant plantar dysesthesia developing after 1 week, drug held 03/12/2020  (a) capecitabine restarted 03/25/2020 at 1000 mg in the morning only.  (b) bone scan 07/23/2020 shows no evidence of disease progression   (c) MRI of the total spine and pelvis 11/01/2020 shows multiple bone lesions but no epidural encroachment, no compression fracture and no evidence for lymphangitic spread of disease; there is significant degenerative disease  (d) other options include CMF, sacituzumab govitecan, bicalutamide   PLAN: Brittany [name] is now a year out from definitive diagnosis of metastatic lobular breast cancer.  Lobular breast cancers are very difficult to image.  In her case we do see multiple bone lesions which by [facility] scan are estrogen receptor negative.  She is being treated with very low doses of capecitabine, which she is managing to tolerate.  Her CA 27-29 continues to show a steady downward trend indicating response  Her pain however is worse.  Certainly she has plenty of tumor in  the bone but there is also significant degenerative disease and I am not sure what is the actual cause of her pain.  We discussed the fact that the goal of pain medication is to allow her to do more so if she feels she hurts too much to walk around the block then she needs to take more pain medicine so she can walk around the block.  She is interested in a neurosurgical opinion and we have referred her to Dr. Vertell Limber for further evaluation and treatment.  Total encounter time 35 minutes.Sarajane Jews C. Jatniel Verastegui, MD 11/19/20 5:55 PM Medical Oncology and Hematology [facility] [place], [place] [address] Tel. [phone]    Fax. [phone]   I, Wilburn Mylar, am acting as scribe for Dr. Virgie Dad. Shaasia Odle.  I, Lurline Del MD,  have reviewed the above documentation for accuracy and completeness, and I agree with the above.    *Total Encounter Time as defined by the Centers for Medicare and Medicaid Services includes, in addition to the face-to-face time of a patient visit (documented in the note above) non-face-to-face time: obtaining and reviewing outside history, ordering and reviewing medications, tests or procedures, care coordination (communications with other health care professionals or caregivers) and documentation in the medical record.

## 2020-11-19 ENCOUNTER — Inpatient Hospital Stay: Payer: Medicare Other | Attending: Oncology

## 2020-11-19 ENCOUNTER — Inpatient Hospital Stay (HOSPITAL_BASED_OUTPATIENT_CLINIC_OR_DEPARTMENT_OTHER): Payer: Medicare Other | Admitting: Oncology

## 2020-11-19 ENCOUNTER — Ambulatory Visit: Payer: Medicare Other

## 2020-11-19 ENCOUNTER — Inpatient Hospital Stay: Payer: Medicare Other

## 2020-11-19 ENCOUNTER — Other Ambulatory Visit: Payer: Self-pay

## 2020-11-19 VITALS — BP 127/78 | HR 79 | Temp 98.1°F | Resp 18 | Ht 67.0 in | Wt 140.0 lb

## 2020-11-19 DIAGNOSIS — Z170 Estrogen receptor positive status [ER+]: Secondary | ICD-10-CM

## 2020-11-19 DIAGNOSIS — C7951 Secondary malignant neoplasm of bone: Secondary | ICD-10-CM | POA: Diagnosis present

## 2020-11-19 DIAGNOSIS — C50112 Malignant neoplasm of central portion of left female breast: Secondary | ICD-10-CM | POA: Diagnosis not present

## 2020-11-19 DIAGNOSIS — H40039 Anatomical narrow angle, unspecified eye: Secondary | ICD-10-CM

## 2020-11-19 DIAGNOSIS — C50912 Malignant neoplasm of unspecified site of left female breast: Secondary | ICD-10-CM | POA: Insufficient documentation

## 2020-11-19 DIAGNOSIS — Z96643 Presence of artificial hip joint, bilateral: Secondary | ICD-10-CM

## 2020-11-19 DIAGNOSIS — Q6589 Other specified congenital deformities of hip: Secondary | ICD-10-CM

## 2020-11-19 DIAGNOSIS — I7300 Raynaud's syndrome without gangrene: Secondary | ICD-10-CM

## 2020-11-19 DIAGNOSIS — M160 Bilateral primary osteoarthritis of hip: Secondary | ICD-10-CM

## 2020-11-19 LAB — CBC WITH DIFFERENTIAL/PLATELET
Abs Immature Granulocytes: 0.02 10*3/uL (ref 0.00–0.07)
Basophils Absolute: 0 10*3/uL (ref 0.0–0.1)
Basophils Relative: 1 %
Eosinophils Absolute: 0.1 10*3/uL (ref 0.0–0.5)
Eosinophils Relative: 3 %
HCT: 34.1 % — ABNORMAL LOW (ref 36.0–46.0)
Hemoglobin: 11.6 g/dL — ABNORMAL LOW (ref 12.0–15.0)
Immature Granulocytes: 0 %
Lymphocytes Relative: 36 %
Lymphs Abs: 1.8 10*3/uL (ref 0.7–4.0)
MCH: 35.5 pg — ABNORMAL HIGH (ref 26.0–34.0)
MCHC: 34 g/dL (ref 30.0–36.0)
MCV: 104.3 fL — ABNORMAL HIGH (ref 80.0–100.0)
Monocytes Absolute: 0.6 10*3/uL (ref 0.1–1.0)
Monocytes Relative: 11 %
Neutro Abs: 2.5 10*3/uL (ref 1.7–7.7)
Neutrophils Relative %: 49 %
Platelets: 302 10*3/uL (ref 150–400)
RBC: 3.27 MIL/uL — ABNORMAL LOW (ref 3.87–5.11)
RDW: 14 % (ref 11.5–15.5)
WBC: 5.1 10*3/uL (ref 4.0–10.5)
nRBC: 0 % (ref 0.0–0.2)

## 2020-11-19 LAB — COMPREHENSIVE METABOLIC PANEL
ALT: 22 U/L (ref 0–44)
AST: 30 U/L (ref 15–41)
Albumin: 3.8 g/dL (ref 3.5–5.0)
Alkaline Phosphatase: 56 U/L (ref 38–126)
Anion gap: 6 (ref 5–15)
BUN: 12 mg/dL (ref 8–23)
CO2: 27 mmol/L (ref 22–32)
Calcium: 9 mg/dL (ref 8.9–10.3)
Chloride: 97 mmol/L — ABNORMAL LOW (ref 98–111)
Creatinine, Ser: 0.88 mg/dL (ref 0.44–1.00)
GFR, Estimated: >60 mL/min (ref >60)
Glucose, Bld: 90 mg/dL (ref 70–99)
Potassium: 5.2 mmol/L — ABNORMAL HIGH (ref 3.5–5.1)
Sodium: 130 mmol/L — ABNORMAL LOW (ref 135–145)
Total Bilirubin: 0.3 mg/dL (ref 0.3–1.2)
Total Protein: 6.9 g/dL (ref 6.5–8.1)

## 2020-11-19 LAB — CEA (IN HOUSE-CHCC): CEA (CHCC-In House): 5.39 ng/mL — ABNORMAL HIGH (ref 0.00–5.00)

## 2020-11-19 MED ORDER — DENOSUMAB 120 MG/1.7ML ~~LOC~~ SOLN
120.0000 mg | Freq: Once | SUBCUTANEOUS | Status: AC
  Administered 2020-11-19: 120 mg via SUBCUTANEOUS

## 2020-11-19 MED ORDER — DENOSUMAB 120 MG/1.7ML ~~LOC~~ SOLN
SUBCUTANEOUS | Status: AC
  Filled 2020-11-19: qty 1.7

## 2020-11-19 NOTE — Patient Instructions (Signed)
Denosumab injection What is this medicine? DENOSUMAB (den oh sue mab) slows bone breakdown. Prolia is used to treat osteoporosis in women after menopause and in men, and in people who are taking corticosteroids for 6 months or more. Xgeva is used to treat a high calcium level due to cancer and to prevent bone fractures and other bone problems caused by multiple myeloma or cancer bone metastases. Xgeva is also used to treat giant cell tumor of the bone. This medicine may be used for other purposes; ask your health care provider or pharmacist if you have questions. COMMON BRAND NAME(S): Prolia, XGEVA What should I tell my health care provider before I take this medicine? They need to know if you have any of these conditions:  dental disease  having surgery or tooth extraction  infection  kidney disease  low levels of calcium or Vitamin D in the blood  malnutrition  on hemodialysis  skin conditions or sensitivity  thyroid or parathyroid disease  an unusual reaction to denosumab, other medicines, foods, dyes, or preservatives  pregnant or trying to get pregnant  breast-feeding How should I use this medicine? This medicine is for injection under the skin. It is given by a health care professional in a hospital or clinic setting. A special MedGuide will be given to you before each treatment. Be sure to read this information carefully each time. For Prolia, talk to your pediatrician regarding the use of this medicine in children. Special care may be needed. For Xgeva, talk to your pediatrician regarding the use of this medicine in children. While this drug may be prescribed for children as young as 13 years for selected conditions, precautions do apply. Overdosage: If you think you have taken too much of this medicine contact a poison control center or emergency room at once. NOTE: This medicine is only for you. Do not share this medicine with others. What if I miss a dose? It is  important not to miss your dose. Call your doctor or health care professional if you are unable to keep an appointment. What may interact with this medicine? Do not take this medicine with any of the following medications:  other medicines containing denosumab This medicine may also interact with the following medications:  medicines that lower your chance of fighting infection  steroid medicines like prednisone or cortisone This list may not describe all possible interactions. Give your health care provider a list of all the medicines, herbs, non-prescription drugs, or dietary supplements you use. Also tell them if you smoke, drink alcohol, or use illegal drugs. Some items may interact with your medicine. What should I watch for while using this medicine? Visit your doctor or health care professional for regular checks on your progress. Your doctor or health care professional may order blood tests and other tests to see how you are doing. Call your doctor or health care professional for advice if you get a fever, chills or sore throat, or other symptoms of a cold or flu. Do not treat yourself. This drug may decrease your body's ability to fight infection. Try to avoid being around people who are sick. You should make sure you get enough calcium and vitamin D while you are taking this medicine, unless your doctor tells you not to. Discuss the foods you eat and the vitamins you take with your health care professional. See your dentist regularly. Brush and floss your teeth as directed. Before you have any dental work done, tell your dentist you are   receiving this medicine. Do not become pregnant while taking this medicine or for 5 months after stopping it. Talk with your doctor or health care professional about your birth control options while taking this medicine. Women should inform their doctor if they wish to become pregnant or think they might be pregnant. There is a potential for serious side  effects to an unborn child. Talk to your health care professional or pharmacist for more information. What side effects may I notice from receiving this medicine? Side effects that you should report to your doctor or health care professional as soon as possible:  allergic reactions like skin rash, itching or hives, swelling of the face, lips, or tongue  bone pain  breathing problems  dizziness  jaw pain, especially after dental work  redness, blistering, peeling of the skin  signs and symptoms of infection like fever or chills; cough; sore throat; pain or trouble passing urine  signs of low calcium like fast heartbeat, muscle cramps or muscle pain; pain, tingling, numbness in the hands or feet; seizures  unusual bleeding or bruising  unusually weak or tired Side effects that usually do not require medical attention (report to your doctor or health care professional if they continue or are bothersome):  constipation  diarrhea  headache  joint pain  loss of appetite  muscle pain  runny nose  tiredness  upset stomach This list may not describe all possible side effects. Call your doctor for medical advice about side effects. You may report side effects to FDA at 1-800-FDA-1088. Where should I keep my medicine? This medicine is only given in a clinic, doctor's office, or other health care setting and will not be stored at home. NOTE: This sheet is a summary. It may not cover all possible information. If you have questions about this medicine, talk to your doctor, pharmacist, or health care provider.  2021 Elsevier/Gold Standard (2018-02-17 16:10:44)

## 2020-11-20 ENCOUNTER — Encounter: Payer: Medicare Other | Admitting: Physical Therapy

## 2020-11-20 LAB — CANCER ANTIGEN 27.29: CA 27.29: 38.6 U/mL (ref 0.0–38.6)

## 2020-11-21 ENCOUNTER — Telehealth: Payer: Self-pay | Admitting: Oncology

## 2020-11-21 NOTE — Telephone Encounter (Signed)
Added lab appointments per 1/26 los. Patient is aware of changes.

## 2020-11-25 ENCOUNTER — Encounter: Payer: Medicare Other | Admitting: Physical Therapy

## 2020-11-26 ENCOUNTER — Ambulatory Visit (INDEPENDENT_AMBULATORY_CARE_PROVIDER_SITE_OTHER): Payer: Medicare Other | Admitting: Physical Therapy

## 2020-11-26 ENCOUNTER — Encounter: Payer: Self-pay | Admitting: Physical Therapy

## 2020-11-26 ENCOUNTER — Other Ambulatory Visit: Payer: Self-pay | Admitting: Family Medicine

## 2020-11-26 ENCOUNTER — Other Ambulatory Visit: Payer: Self-pay

## 2020-11-26 DIAGNOSIS — M5442 Lumbago with sciatica, left side: Secondary | ICD-10-CM | POA: Diagnosis not present

## 2020-11-26 DIAGNOSIS — M6281 Muscle weakness (generalized): Secondary | ICD-10-CM | POA: Diagnosis not present

## 2020-11-26 DIAGNOSIS — R2689 Other abnormalities of gait and mobility: Secondary | ICD-10-CM

## 2020-11-26 DIAGNOSIS — M25552 Pain in left hip: Secondary | ICD-10-CM

## 2020-11-26 MED ORDER — TRAMADOL HCL 50 MG PO TABS: 50.0000 mg | ORAL_TABLET | Freq: Four times a day (QID) | ORAL | 0 refills | Status: DC | PRN

## 2020-11-26 NOTE — Telephone Encounter (Signed)
Please advise 

## 2020-11-26 NOTE — Therapy (Signed)
[place], [place], [address] Phone: [phone]   Fax:  [phone]  Physical Therapy Treatment  Patient Details  Name: Brittany [name] MRN: [id number] Date of Birth: [birth year] Referring Provider (PT): Eunice Blase, MD   Encounter Date: 11/26/2020   PT End of Session - 11/26/20 0937    Visit Number 5    Number of Visits 12    Date for PT Re-Evaluation 12/09/20    Authorization Type MCR/BCBS    Progress Note Due on Visit 10    PT Start Time 0845    PT Stop Time 0932    PT Time Calculation (min) 47 min    Activity Tolerance Patient tolerated treatment well    Behavior During Therapy [facility] for tasks assessed/performed           Past Medical History:  Diagnosis Date  . ANEMIA-NOS   . Asymptomatic varicose veins   . Breast cancer ([place])   . BREAST CANCER, HX OF 01/1995 dx   s/p Lumpectomy, XRT, chemo and 57yr armidex  . Gestational diabetes mellitus in childbirth, diet controlled 1985  . Hypothyroid 09/28/2015   Dx 09/2015  . Leukopenia    mild since chemo  . OSTEOARTHRITIS   . Personal history of chemotherapy   . Personal history of radiation therapy   . SCOLIOSIS, MILD     Past Surgical History:  Procedure Laterality Date  . BREAST BIOPSY    . BREAST LUMPECTOMY Left    1996  . left lumpectomy  1996  . TONSILLECTOMY    . TOTAL HIP ARTHROPLASTY     2002 & 2003-Dr. vail @ [place]    There were no vitals filed for this visit.   Subjective Assessment - 11/26/20 0903    Subjective She says she had about 7-8 out of 10 LBP and pain into her left hip upon waking up this morning, but then after sitting in the car to drive over to the clinic and sitting in the lobby the pain subsided. She does also say the hip hike exercise hurts too much so she was instucted to discontinue this.    Pertinent History bilat THA, She does have a history of metastatic breast cancer.  She had a bone scan in September showing stable  lesions throughout the spine.    Limitations Lifting;Standing;Walking    Diagnostic tests "Lumbar x-rays reveal slight anterolisthesis of L5 on S1 with facet arthropathy at that level and to a milder degree in the upper levels.  The hip prosthesis looks intact with good alignment, no sign of loosening. "    Pain Onset More than a month ago             [facility] Adult PT Treatment/Exercise - 11/26/20 0001      Lumbar Exercises: Stretches   Double Knee to Chest Stretch 10 seconds    Double Knee to Chest Stretch Limitations 10 reps with feet on pball    ITB Stretch 3 reps;30 seconds    Other Lumbar Stretch Exercise single knee to opposite shoulder 20 sec x 3    Other Lumbar Stretch Exercise seated pball roll outs into fleixon 10 sec X 10      Lumbar Exercises: Aerobic   Recumbent Bike 8 min L3      Lumbar Exercises: Machines for Strengthening   Leg Press SL 43 lbs 3 sets of 10 bilat      Lumbar Exercises: Supine   Pelvic Tilt 20 reps  Bridge 10 reps   holding 10 sec   Other Supine Lumbar Exercises core strengthening bicycle 2 sets of 10 bilat with TAC      Lumbar Exercises: Sidelying   Clam Left    Clam Limitations 3 sets of 10 with green    Other Sidelying Lumbar Exercises reveres clam on Lt 3 sets of 10 with red      Manual Therapy   Manual therapy comments STM/IASTM with percussion/thera gun  10 min to left lateral/posterior leg                    PT Short Term Goals - 11/13/20 0947      PT SHORT TERM GOAL #1   Title She will be independent with inital HEP     Baseline no HEP until today    Time 4    Period Weeks    Status Achieved    Target Date 11/11/20             PT Long Term Goals - 10/14/20 1332      PT LONG TERM GOAL #1   Title She will improve FOTO score to predicted value once has been completed we will know the value.    Time 8    Period Weeks    Status New    Target Date 12/09/20      PT LONG TERM GOAL #2   Title She will report pain  improved to no more than 3/10 with ususal activity.    Baseline 9/10 pain    Time 8    Period Weeks    Status New    Target Date 12/09/20      PT LONG TERM GOAL #3   Title She will be able to return to light golf and tennis    Time 8    Period Weeks    Status New      PT LONG TERM GOAL #4   Title Lumbar ROM improved to [facility] with minimal amount of pain    Time 8    Period Weeks    Status New                 Plan - 11/26/20 2951    Clinical Impression Statement She does appear to be overall making some progress with pain and hip strength but she continues to have pain that is worse first thing in the morning. She was encouraged to try the stretches first thing in the moring with self massage/trigger point release techniques. She will have lumbar injection from MD this week so we will assess her response to that next visit.    Examination-Activity Limitations Bend;Sleep;Squat;Stairs;Stand;Lift    Examination-Participation Restrictions Cleaning;Community Activity;Shop;Other    Stability/Clinical Decision Making Stable/Uncomplicated    Rehab Potential Good    PT Frequency 2x / week    PT Duration 8 weeks    PT Treatment/Interventions ADLs/Self Care Home Management;Cryotherapy;Electrical Stimulation;Iontophoresis 4mg /ml Dexamethasone;Moist Heat;Traction;Ultrasound;Therapeutic activities;Therapeutic exercise;Balance training;Neuromuscular re-education;Manual techniques;Passive range of motion;Dry needling;Joint Manipulations;Spinal Manipulations;Taping    PT Next Visit Plan how was injeciton? Flexion based movements, hip strengthening, DN again?    PT Home Exercise Plan Access Code: [email or web address]    Consulted and Agree with Plan of Care Patient           Patient will benefit from skilled therapeutic intervention in order to improve the following deficits and impairments:  Decreased activity tolerance,Decreased balance,Decreased endurance,Decreased range of motion,Decreased  strength,Increased edema,Hypomobility,Difficulty walking,Impaired flexibility,Postural dysfunction,Pain  Visit Diagnosis: Acute left-sided low back pain with left-sided sciatica  Pain in left hip  Muscle weakness (generalized)  Other abnormalities of gait and mobility     Problem List Patient Active Problem List   Diagnosis Date Noted  . Lumbar radiculopathy 10/07/2020  . 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  . Malignant neoplasm of central portion of left breast in female, estrogen receptor positive ([place]) 11/22/2019  . Bone metastases ([place]) 11/22/2019  . Anatomical narrow angle glaucoma 09/25/2018  . History of gestational diabetes 09/21/2017  . Raynaud phenomenon 09/21/2016  . 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 Left rotator cuff tear 10/07/2015  . Hypothyroid 09/28/2015  . Leukopenia 12/08/2012  . Metatarsalgia of both feet 09/21/2011  . CERVICALGIA 05/04/2010  . Asymptomatic varicose veins 03/17/2009  . Osteoarthritis 03/17/2009  . SCOLIOSIS, MILD 03/17/2009    Debbe Odea, PT, DPT 11/26/2020, 9:40 AM  [facility] Physical Therapy [address] [place], [place], [address] Phone: [phone]   Fax:  [phone]  Name: ELLORA VARNUM MRN: [id number] Date of Birth: January 20, 1956

## 2020-11-27 ENCOUNTER — Encounter: Payer: Medicare Other | Admitting: Physical Therapy

## 2020-11-27 ENCOUNTER — Ambulatory Visit (INDEPENDENT_AMBULATORY_CARE_PROVIDER_SITE_OTHER): Payer: Medicare Other | Admitting: Physical Medicine and Rehabilitation

## 2020-11-27 ENCOUNTER — Ambulatory Visit: Payer: Self-pay

## 2020-11-27 ENCOUNTER — Encounter: Payer: Self-pay | Admitting: Physical Medicine and Rehabilitation

## 2020-11-27 VITALS — BP 128/82 | HR 80

## 2020-11-27 DIAGNOSIS — M5416 Radiculopathy, lumbar region: Secondary | ICD-10-CM

## 2020-11-27 MED ORDER — BETAMETHASONE SOD PHOS & ACET 6 (3-3) MG/ML IJ SUSP
12.0000 mg | Freq: Once | INTRAMUSCULAR | Status: AC
Start: 2020-11-27 — End: 2020-11-27
  Administered 2020-11-27: 12 mg

## 2020-11-27 NOTE — Progress Notes (Signed)
Pt state lower back pain that travels down her left thigh to her ankle. Pt state walking makes the pain worse. Pt state she take pain meds and have to bend over to walk.  Numeric Pain Rating Scale and Functional Assessment Average Pain 5   In the last MONTH (on 0-10 scale) has pain interfered with the following?  1. General activity like being  able to carry out your everyday physical activities such as walking, climbing stairs, carrying groceries, or moving a chair?  Rating(10)   +Driver, -BT, -Dye Allergies.

## 2020-11-27 NOTE — Progress Notes (Signed)
Brittany [name] - 65 y.o. female MRN [id number]  Date of birth: 05-11-1956  Office Visit Note: Visit Date: 11/27/2020 PCP: Binnie Rail, MD Referred by: Binnie Rail, MD  Subjective: Chief Complaint  Patient presents with  . Lower Back - Pain  . Left Ankle - Pain  . Left Thigh - Pain   HPI:  Brittany [name] is a 65 y.o. female who comes in today at the request of Dr. Eunice Blase for planned Left L4 Lumbar epidural steroid injection with fluoroscopic guidance.  The patient has failed conservative care including home exercise, medications, time and activity modification.  This injection will be diagnostic and hopefully therapeutic.  Please see requesting physician notes for further details and justification.  MRI reviewed with images and spine model.  MRI reviewed in the note below.    ROS Otherwise per HPI.  Assessment & Plan: Visit Diagnoses:    ICD-10-CM   1. Lumbar radiculopathy  M54.16 XR C-ARM NO REPORT    Epidural Steroid injection    betamethasone acetate-betamethasone sodium phosphate (CELESTONE) injection 12 mg    Plan: No additional findings.   Meds & Orders:  Meds ordered this encounter  Medications  . betamethasone acetate-betamethasone sodium phosphate (CELESTONE) injection 12 mg    Orders Placed This Encounter  Procedures  . XR C-ARM NO REPORT  . Epidural Steroid injection    Follow-up: Return if symptoms worsen or fail to improve.   Procedures: No procedures performed  Lumbosacral Transforaminal Epidural Steroid Injection - Sub-Pedicular Approach with Fluoroscopic Guidance  Patient: Brittany [name]      Date of Birth: 01-05-1956 MRN: [id number] PCP: Binnie Rail, MD      Visit Date: 11/27/2020   Universal Protocol:    Date/Time: 11/27/2020  Consent Given By: the patient  Position: PRONE  Additional Comments: Vital signs were monitored before and after the procedure. Patient was prepped and draped in the usual sterile fashion. The  correct patient, procedure, and site was verified.   Injection Procedure Details:   Procedure diagnoses: Lumbar radiculopathy [M54.16]    Meds Administered:  Meds ordered this encounter  Medications  . betamethasone acetate-betamethasone sodium phosphate (CELESTONE) injection 12 mg    Laterality: Left  Location/Site:  L4-L5  Needle:5.0 in., 22 ga.  Short bevel or Quincke spinal needle  Needle Placement: Transforaminal  Findings:    -Comments: Excellent flow of contrast along the nerve, nerve root and into the epidural space.  Procedure Details: After squaring off the end-plates to get a true AP view, the C-arm was positioned so that an oblique view of the foramen as noted above was visualized. The target area is just inferior to the "nose of the scotty dog" or sub pedicular. The soft tissues overlying this structure were infiltrated with 2-3 ml. of 1% Lidocaine without Epinephrine.  The spinal needle was inserted toward the target using a "trajectory" view along the fluoroscope beam.  Under AP and lateral visualization, the needle was advanced so it did not puncture dura and was located close the 6 O'Clock position of the pedical in AP tracterory. Biplanar projections were used to confirm position. Aspiration was confirmed to be negative for CSF and/or blood. A 1-2 ml. volume of Isovue-250 was injected and flow of contrast was noted at each level. Radiographs were obtained for documentation purposes.   After attaining the desired flow of contrast documented above, a 0.5 to 1.0 ml test dose of 0.25% Marcaine was injected into each respective  transforaminal space.  The patient was observed for 90 seconds post injection.  After no sensory deficits were reported, and normal lower extremity motor function was noted,   the above injectate was administered so that equal amounts of the injectate were placed at each foramen (level) into the transforaminal epidural space.   Additional  Comments:  The patient tolerated the procedure well Dressing: 2 x 2 sterile gauze and Band-Aid    Post-procedure details: Patient was observed during the procedure. Post-procedure instructions were reviewed.  Patient left the clinic in stable condition.      Clinical History: MRI LUMBAR SPINE  Segmentation: Transitional anatomy at the lumbosacral junction when counting from above and assuming 12 rib-bearing thoracic vertebral bodies. For the purposes of this dictation, there is a lumbarized S1.  Alignment:  No significant listhesis.  Vertebrae: Diffuse abnormal signal with areas of low T1 and T2 signal reflecting sclerosis and foci of enhancement. There is no significant epidural disease.  Conus medullaris: Extends to the L2 level and appears normal. No abnormal intrathecal enhancement.  Paraspinal and other soft tissues: Unremarkable.  Disc levels: Multilevel degenerative disc disease with disc bulges and endplate osteophytes. Multilevel facet arthropathy, greatest at L5-S1 where there are joint effusions and extra-spinal synovial cysts. No high-grade canal stenosis identified. Foraminal narrowing is greatest on the left at L5-S1.  IMPRESSION: Motion degraded study.  Diffuse osseous metastatic disease. No significant epidural disease. No recent compression fracture. No evidence of leptomeningeal disease.   Electronically Signed   By: Macy Mis M.D.   On: 11/01/2020 16:14 ------  MRI PELVIS WITHOUT AND WITH CONTRAST  TECHNIQUE: Multiplanar multisequence MR imaging of the pelvis was performed both before and after administration of intravenous contrast.  CONTRAST: 20mL GADAVIST GADOBUTROL 1 MMOL/ML IV SOLN  COMPARISON: Bone scan dated July 22, 2020. PET-CT dated January 11, 2020.  FINDINGS: Bones: Diffuse marrow heterogeneity throughout the pelvis, proximal femurs, and visualized lower lumbar spine with innumerable small sclerotic  foci. There is no evidence of acute fracture, dislocation or avascular necrosis. Prior bilateral hip arthroplasties with associated susceptibility artifact. The visualized sacroiliac joints and symphysis pubis appear normal.  Joint or bursal effusion  Joint effusion: No significant hip joint effusion.  Bursae: Moderate amount of fluid in the right greater than left greater trochanteric bursas.  Muscles and tendons  Muscles and tendons: The visualized gluteus, hamstring and iliopsoas tendons appear normal. No muscle edema. Mild left greater than right gluteus minimus muscle atrophy.  Other findings  Miscellaneous: Mild sigmoid colonic diverticulosis.  IMPRESSION: 1. Widespread osseous metastatic disease. No pathologic fracture. 2. Bilateral total hip arthroplasties. Moderate bilateral greater trochanteric bursitis.   Electronically Signed By: Titus Dubin M.D. On: 11/01/2020 15:48     Objective:  VS:  HT:    WT:   BMI:     BP:128/82  HR:80bpm  TEMP: ( )  RESP:  Physical Exam Vitals and nursing note reviewed.  Constitutional:      General: She is not in acute distress.    Appearance: Normal appearance. She is not ill-appearing.  HENT:     Head: Normocephalic and atraumatic.     Right Ear: External ear normal.     Left Ear: External ear normal.  Eyes:     Extraocular Movements: Extraocular movements intact.  Cardiovascular:     Rate and Rhythm: Normal rate.     Pulses: Normal pulses.  Pulmonary:     Effort: Pulmonary effort is normal. No respiratory distress.  Abdominal:  General: There is no distension.     Palpations: Abdomen is soft.  Musculoskeletal:        General: Tenderness present.     Cervical back: Neck supple.     Right lower leg: No edema.     Left lower leg: No edema.     Comments: Patient has good distal strength with no pain over the greater trochanters.  No clonus or focal weakness.  Skin:    Findings: No erythema, lesion  or rash.  Neurological:     General: No focal deficit present.     Mental Status: She is alert and oriented to person, place, and time.     Sensory: No sensory deficit.     Motor: No weakness or abnormal muscle tone.     Coordination: Coordination normal.  Psychiatric:        Mood and Affect: Mood normal.        Behavior: Behavior normal.      Imaging: No results found.

## 2020-11-27 NOTE — Patient Instructions (Signed)

## 2020-11-27 NOTE — Procedures (Signed)
Lumbosacral Transforaminal Epidural Steroid Injection - Sub-Pedicular Approach with Fluoroscopic Guidance  Patient: Brittany [name]      Date of Birth: October 26, 1955 MRN: [id number] PCP: Binnie Rail, MD      Visit Date: 11/27/2020   Universal Protocol:    Date/Time: 11/27/2020  Consent Given By: the patient  Position: PRONE  Additional Comments: Vital signs were monitored before and after the procedure. Patient was prepped and draped in the usual sterile fashion. The correct patient, procedure, and site was verified.   Injection Procedure Details:   Procedure diagnoses: Lumbar radiculopathy [M54.16]    Meds Administered:  Meds ordered this encounter  Medications  . betamethasone acetate-betamethasone sodium phosphate (CELESTONE) injection 12 mg    Laterality: Left  Location/Site:  L4-L5  Needle:5.0 in., 22 ga.  Short bevel or Quincke spinal needle  Needle Placement: Transforaminal  Findings:    -Comments: Excellent flow of contrast along the nerve, nerve root and into the epidural space.  Procedure Details: After squaring off the end-plates to get a true AP view, the C-arm was positioned so that an oblique view of the foramen as noted above was visualized. The target area is just inferior to the "nose of the scotty dog" or sub pedicular. The soft tissues overlying this structure were infiltrated with 2-3 ml. of 1% Lidocaine without Epinephrine.  The spinal needle was inserted toward the target using a "trajectory" view along the fluoroscope beam.  Under AP and lateral visualization, the needle was advanced so it did not puncture dura and was located close the 6 O'Clock position of the pedical in AP tracterory. Biplanar projections were used to confirm position. Aspiration was confirmed to be negative for CSF and/or blood. A 1-2 ml. volume of Isovue-250 was injected and flow of contrast was noted at each level. Radiographs were obtained for documentation purposes.    After attaining the desired flow of contrast documented above, a 0.5 to 1.0 ml test dose of 0.25% Marcaine was injected into each respective transforaminal space.  The patient was observed for 90 seconds post injection.  After no sensory deficits were reported, and normal lower extremity motor function was noted,   the above injectate was administered so that equal amounts of the injectate were placed at each foramen (level) into the transforaminal epidural space.   Additional Comments:  The patient tolerated the procedure well Dressing: 2 x 2 sterile gauze and Band-Aid    Post-procedure details: Patient was observed during the procedure. Post-procedure instructions were reviewed.  Patient left the clinic in stable condition.

## 2020-11-28 ENCOUNTER — Ambulatory Visit (INDEPENDENT_AMBULATORY_CARE_PROVIDER_SITE_OTHER): Payer: Medicare Other | Admitting: Physical Therapy

## 2020-11-28 ENCOUNTER — Ambulatory Visit
Admission: RE | Admit: 2020-11-28 | Discharge: 2020-11-28 | Disposition: A | Discharge status: Home / Self Care | Payer: Medicare Other | Source: Ambulatory Visit | Attending: Oncology | Admitting: Oncology

## 2020-11-28 ENCOUNTER — Other Ambulatory Visit: Payer: Self-pay

## 2020-11-28 DIAGNOSIS — Z170 Estrogen receptor positive status [ER+]: Secondary | ICD-10-CM

## 2020-11-28 DIAGNOSIS — M6281 Muscle weakness (generalized): Secondary | ICD-10-CM | POA: Diagnosis not present

## 2020-11-28 DIAGNOSIS — M25552 Pain in left hip: Secondary | ICD-10-CM

## 2020-11-28 DIAGNOSIS — C50112 Malignant neoplasm of central portion of left female breast: Secondary | ICD-10-CM

## 2020-11-28 DIAGNOSIS — R2689 Other abnormalities of gait and mobility: Secondary | ICD-10-CM

## 2020-11-28 DIAGNOSIS — M1909 Primary osteoarthritis, other specified site: Secondary | ICD-10-CM

## 2020-11-28 DIAGNOSIS — M5442 Lumbago with sciatica, left side: Secondary | ICD-10-CM

## 2020-11-28 DIAGNOSIS — C7951 Secondary malignant neoplasm of bone: Secondary | ICD-10-CM

## 2020-11-28 NOTE — Therapy (Signed)
[place], [place], [address] Phone: [phone]   Fax:  [phone]  Physical Therapy Treatment  Patient Details  Name: Brittany [name] MRN: [id number] Date of Birth: 1956-08-31 Referring Provider (PT): Eunice Blase, MD   Encounter Date: 11/28/2020   PT End of Session - 11/28/20 1139    Visit Number 6    Number of Visits 12    Date for PT Re-Evaluation 12/09/20    Authorization Type MCR/BCBS    Progress Note Due on Visit 10    PT Start Time 1058    PT Stop Time 1138    PT Time Calculation (min) 40 min    Activity Tolerance Patient limited by pain;Treatment limited secondary to agitation    Behavior During Therapy [facility] for tasks assessed/performed           Past Medical History:  Diagnosis Date  . ANEMIA-NOS   . Asymptomatic varicose veins   . Bone metastases ([place]) 2021  . Breast cancer ([place]) 1996   LEFT BREAST CA  . BREAST CANCER, HX OF 01/1995 dx   s/p Lumpectomy, XRT, chemo and 26yr armidex  . Gestational diabetes mellitus in childbirth, diet controlled 1985  . Hypothyroid 09/28/2015   Dx 09/2015  . Leukopenia    mild since chemo  . OSTEOARTHRITIS   . Personal history of chemotherapy   . Personal history of radiation therapy   . SCOLIOSIS, MILD     Past Surgical History:  Procedure Laterality Date  . BREAST BIOPSY    . BREAST LUMPECTOMY Left    1996  . left lumpectomy  1996  . TONSILLECTOMY    . TOTAL HIP ARTHROPLASTY     2002 & 2003-Dr. vail @ [place]    There were no vitals filed for this visit.   Subjective Assessment - 11/28/20 1138    Subjective Patient had her injection yesterday in Lt Hip, she is still feeling extremely painful. She had to stand for 10 minutes this morning for her mammogram and felt like she was going to collapse from having to stand that long. Pain rating was 8/10.    Pertinent History bilat THA, She does have a history of metastatic breast cancer.  She had a bone scan in  September showing stable lesions throughout the spine.    Limitations Lifting;Standing;Walking    Diagnostic tests "Lumbar x-rays reveal slight anterolisthesis of L5 on S1 with facet arthropathy at that level and to a milder degree in the upper levels.  The hip prosthesis looks intact with good alignment, no sign of loosening. "    Currently in Pain? Yes    Pain Score 8     Pain Location Hip    Pain Orientation Left    Pain Descriptors / Indicators Aching    Pain Onset More than a month ago             [facility] Adult PT Treatment/Exercise - 11/28/20 0001      Lumbar Exercises: Stretches   Single Knee to Chest Stretch Left;Right;2 reps;30 seconds    Double Knee to Chest Stretch 30 seconds;2 reps    Lower Trunk Rotation 10 seconds;5 reps   slight agitation with this stretch     Lumbar Exercises: Aerobic   Recumbent Bike 8 min L3      Lumbar Exercises: Supine   Bridge 10 reps;5 seconds   on stability ball   Other Supine Lumbar Exercises stability ball double leg rollouts 5 sec flexion  hold 10x      Cryotherapy   Number Minutes Cryotherapy 10 Minutes    Cryotherapy Location Hip   Left, sidelying   Type of Cryotherapy Ice pack                    PT Short Term Goals - 11/13/20 0947      PT SHORT TERM GOAL #1   Title She will be independent with inital HEP     Baseline no HEP until today    Time 4    Period Weeks    Status Achieved    Target Date 11/11/20             PT Long Term Goals - 10/14/20 1332      PT LONG TERM GOAL #1   Title She will improve FOTO score to predicted value once has been completed we will know the value.    Time 8    Period Weeks    Status New    Target Date 12/09/20      PT LONG TERM GOAL #2   Title She will report pain improved to no more than 3/10 with ususal activity.    Baseline 9/10 pain    Time 8    Period Weeks    Status New    Target Date 12/09/20      PT LONG TERM GOAL #3   Title She will be able to return to light  golf and tennis    Time 8    Period Weeks    Status New      PT LONG TERM GOAL #4   Title Lumbar ROM improved to [facility] with minimal amount of pain    Time 8    Period Weeks    Status New                 Plan - 11/28/20 1140    Clinical Impression Statement Patient was limited by hip pain today during session. She was very tender around hip region upon palpation and noted a deep ache. Utilized stretching and light strenghtening prior to cryotherapy to decrease pain and inflammation. Reccomended patient rest and ice every few hours this weekend and stretches as tolerated.    Examination-Activity Limitations Bend;Sleep;Squat;Stairs;Stand;Lift    Examination-Participation Restrictions Cleaning;Community Activity;Shop;Other    Stability/Clinical Decision Making Stable/Uncomplicated    Rehab Potential Good    PT Frequency 2x / week    PT Duration 8 weeks    PT Treatment/Interventions ADLs/Self Care Home Management;Cryotherapy;Electrical Stimulation;Iontophoresis 4mg /ml Dexamethasone;Moist Heat;Traction;Ultrasound;Therapeutic activities;Therapeutic exercise;Balance training;Neuromuscular re-education;Manual techniques;Passive range of motion;Dry needling;Joint Manipulations;Spinal Manipulations;Taping    PT Next Visit Plan pain after injection? Flexion based movements, hip strengthening as tolerated, DN again?    PT Home Exercise Plan Access Code: [email or web address]    Consulted and Agree with Plan of Care Patient           Patient will benefit from skilled therapeutic intervention in order to improve the following deficits and impairments:  Decreased activity tolerance,Decreased balance,Decreased endurance,Decreased range of motion,Decreased strength,Increased edema,Hypomobility,Difficulty walking,Impaired flexibility,Postural dysfunction,Pain  Visit Diagnosis: Acute left-sided low back pain with left-sided sciatica  Pain in left hip  Muscle weakness (generalized)  Other abnormalities of  gait and mobility     Problem List Patient Active Problem List   Diagnosis Date Noted  . Lumbar radiculopathy 10/07/2020  . 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  . Malignant neoplasm of central portion of left breast in female, estrogen receptor positive ([place])  11/22/2019  . Bone metastases ([place]) 11/22/2019  . Anatomical narrow angle glaucoma 09/25/2018  . History of gestational diabetes 09/21/2017  . Raynaud phenomenon 09/21/2016  . 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 Left rotator cuff tear 10/07/2015  . Hypothyroid 09/28/2015  . Leukopenia 12/08/2012  . Metatarsalgia of both feet 09/21/2011  . CERVICALGIA 05/04/2010  . Asymptomatic varicose veins 03/17/2009  . Osteoarthritis 03/17/2009  . SCOLIOSIS, MILD 03/17/2009    Glenetta Hew, SPT 11/28/2020, 11:46 AM During this treatment session, this physical therapist was present, participating in and directing the treatment.   This note has been reviewed and this clinician agrees with the information provided.  Elsie Ra, PT, DPT 11/28/20 1:24 PM  [facility] Physical Therapy [address] [place], [place], [address] Phone: [phone]   Fax:  [phone]  Name: Brittany [name] MRN: [id number] Date of Birth: 01/07/1956

## 2020-12-01 ENCOUNTER — Other Ambulatory Visit: Payer: Self-pay

## 2020-12-01 ENCOUNTER — Ambulatory Visit (INDEPENDENT_AMBULATORY_CARE_PROVIDER_SITE_OTHER): Payer: Medicare Other | Admitting: Physical Therapy

## 2020-12-01 ENCOUNTER — Encounter: Payer: Self-pay | Admitting: Physical Medicine and Rehabilitation

## 2020-12-01 DIAGNOSIS — R2689 Other abnormalities of gait and mobility: Secondary | ICD-10-CM | POA: Diagnosis not present

## 2020-12-01 DIAGNOSIS — M6281 Muscle weakness (generalized): Secondary | ICD-10-CM

## 2020-12-01 DIAGNOSIS — M5442 Lumbago with sciatica, left side: Secondary | ICD-10-CM | POA: Diagnosis not present

## 2020-12-01 DIAGNOSIS — M25552 Pain in left hip: Secondary | ICD-10-CM | POA: Diagnosis not present

## 2020-12-01 NOTE — Therapy (Signed)
[place], [place], [address] Phone: [phone]   Fax:  [phone]  Physical Therapy Treatment  Patient Details  Name: Brittany [name] MRN: [id number] Date of Birth: September 27, 1956 Referring Provider (PT): Eunice Blase, MD   Encounter Date: 12/01/2020   PT End of Session - 12/01/20 1354    Visit Number 7    Number of Visits 12    Date for PT Re-Evaluation 12/09/20    Authorization Type MCR/BCBS    Progress Note Due on Visit 10    PT Start Time 1302    PT Stop Time 1345    PT Time Calculation (min) 43 min    Activity Tolerance Patient limited by pain;Treatment limited secondary to agitation    Behavior During Therapy [facility] for tasks assessed/performed           Past Medical History:  Diagnosis Date  . ANEMIA-NOS   . Asymptomatic varicose veins   . Bone metastases ([place]) 2021  . Breast cancer ([place]) 1996   LEFT BREAST CA  . BREAST CANCER, HX OF 01/1995 dx   s/p Lumpectomy, XRT, chemo and 29yr armidex  . Gestational diabetes mellitus in childbirth, diet controlled 1985  . Hypothyroid 09/28/2015   Dx 09/2015  . Leukopenia    mild since chemo  . OSTEOARTHRITIS   . Personal history of chemotherapy   . Personal history of radiation therapy   . SCOLIOSIS, MILD     Past Surgical History:  Procedure Laterality Date  . BREAST BIOPSY    . BREAST LUMPECTOMY Left    1996  . left lumpectomy  1996  . TONSILLECTOMY    . TOTAL HIP ARTHROPLASTY     2002 & 2003-Dr. vail @ [place]    There were no vitals filed for this visit.   Subjective Assessment - 12/01/20 1321    Subjective relays the pain in her left hip is overall about the same, she isnt having much pain at rest or sitting but if she tries to do any standing or walking she gets the same pain. She has not yet noticed any improvement since injection.    Pertinent History bilat THA, She does have a history of metastatic breast cancer.  She had a bone scan in September  showing stable lesions throughout the spine.    Limitations Lifting;Standing;Walking    Diagnostic tests "Lumbar x-rays reveal slight anterolisthesis of L5 on S1 with facet arthropathy at that level and to a milder degree in the upper levels.  The hip prosthesis looks intact with good alignment, no sign of loosening. "    Pain Onset More than a month ago            [facility] Adult PT Treatment/Exercise - 12/01/20 0001      Lumbar Exercises: Stretches   Single Knee to Chest Stretch Left;Right;2 reps;30 seconds    Double Knee to Chest Stretch 10 seconds    Double Knee to Chest Stretch Limitations 10 reps with feet on pball    Other Lumbar Stretch Exercise single knee to opposite shoulder 20 sec x 3    Other Lumbar Stretch Exercise seated pball roll outs into fleixon 10 sec X 10      Lumbar Exercises: Aerobic   Recumbent Bike 10 min L3      Lumbar Exercises: Machines for Strengthening   Leg Press SL 50 lbs 3 sets of 10 bilat      Lumbar Exercises: Sidelying   Clam Limitations 3  sets of 10 with green    Other Sidelying Lumbar Exercises reveres clam on Lt 3 sets of 10 with 3# on left ankle      Manual Therapy   Manual therapy comments theragun to left lateral and posterior hip in sidelying 10 min                    PT Short Term Goals - 11/13/20 0947      PT SHORT TERM GOAL #1   Title She will be independent with inital HEP     Baseline no HEP until today    Time 4    Period Weeks    Status Achieved    Target Date 11/11/20             PT Long Term Goals - 10/14/20 1332      PT LONG TERM GOAL #1   Title She will improve FOTO score to predicted value once has been completed we will know the value.    Time 8    Period Weeks    Status New    Target Date 12/09/20      PT LONG TERM GOAL #2   Title She will report pain improved to no more than 3/10 with ususal activity.    Baseline 9/10 pain    Time 8    Period Weeks    Status New    Target Date 12/09/20       PT LONG TERM GOAL #3   Title She will be able to return to light golf and tennis    Time 8    Period Weeks    Status New      PT LONG TERM GOAL #4   Title Lumbar ROM improved to [facility] with minimal amount of pain    Time 8    Period Weeks    Status New                 Plan - 12/01/20 1355    Clinical Impression Statement She continues to have pain with standing activity. Adjusted her HEP today to remove standing execises and instead to focus on the stretching and open chain NWB hip strengthening for now then once she improves more foundational hip strength we can hopefully progress her back to more standing activity. Progress has been slow and limited at this point.    Examination-Activity Limitations Bend;Sleep;Squat;Stairs;Stand;Lift    Examination-Participation Restrictions Cleaning;Community Activity;Shop;Other    Stability/Clinical Decision Making Stable/Uncomplicated    Rehab Potential Good    PT Frequency 2x / week    PT Duration 8 weeks    PT Treatment/Interventions ADLs/Self Care Home Management;Cryotherapy;Electrical Stimulation;Iontophoresis 4mg /ml Dexamethasone;Moist Heat;Traction;Ultrasound;Therapeutic activities;Therapeutic exercise;Balance training;Neuromuscular re-education;Manual techniques;Passive range of motion;Dry needling;Joint Manipulations;Spinal Manipulations;Taping    PT Next Visit Plan Flexion based movements, hip strengthening as tolerated, DN again?    PT Home Exercise Plan Access Code: [email or web address]    Consulted and Agree with Plan of Care Patient           Patient will benefit from skilled therapeutic intervention in order to improve the following deficits and impairments:  Decreased activity tolerance,Decreased balance,Decreased endurance,Decreased range of motion,Decreased strength,Increased edema,Hypomobility,Difficulty walking,Impaired flexibility,Postural dysfunction,Pain  Visit Diagnosis: Acute left-sided low back pain with left-sided  sciatica  Pain in left hip  Muscle weakness (generalized)  Other abnormalities of gait and mobility     Problem List Patient Active Problem List   Diagnosis Date Noted  . Lumbar radiculopathy 10/07/2020  .  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  . Malignant neoplasm of central portion of left breast in female, estrogen receptor positive ([place]) 11/22/2019  . Bone metastases ([place]) 11/22/2019  . Anatomical narrow angle glaucoma 09/25/2018  . History of gestational diabetes 09/21/2017  . Raynaud phenomenon 09/21/2016  . 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 Left rotator cuff tear 10/07/2015  . Hypothyroid 09/28/2015  . Leukopenia 12/08/2012  . Metatarsalgia of both feet 09/21/2011  . CERVICALGIA 05/04/2010  . Asymptomatic varicose veins 03/17/2009  . Osteoarthritis 03/17/2009  . SCOLIOSIS, MILD 03/17/2009    Silvestre Mesi 12/01/2020, 1:58 PM    [facility] Physical Therapy [address] [place], [place], [address] Phone: [phone]   Fax:  [phone]  Name: CATHEY BATTLE MRN: [id number] Date of Birth: 1956/07/28

## 2020-12-01 NOTE — Patient Instructions (Signed)
Access Code: [email or web address] URL: [email or web address] Date: 12/01/2020 Prepared by: Elsie Ra  Exercises Supine Double Knee to Chest - 2 x daily - 6 x weekly - 1 sets - 10 reps - 10 hold Supine Figure 4 Piriformis Stretch - 2 x daily - 6 x weekly - 1 sets - 3 reps - 30 hold Sidelying Reverse Clamshell - 2 x daily - 6 x weekly - 10 reps - 1-2 sets Clam with Resistance - 2 x daily - 6 x weekly - 3 sets - 10 reps Side Plank on Knees - 2 x daily - 6 x weekly - 3 reps - 1 sets - 30 hold Seated Lumbar Flexion Stretch - 2 x daily - 6 x weekly - 10 reps - 1 sets - 10 hold

## 2020-12-04 ENCOUNTER — Encounter: Payer: Medicare Other | Admitting: Physical Therapy

## 2020-12-08 ENCOUNTER — Ambulatory Visit (INDEPENDENT_AMBULATORY_CARE_PROVIDER_SITE_OTHER): Payer: Medicare Other | Admitting: Physical Therapy

## 2020-12-08 ENCOUNTER — Other Ambulatory Visit: Payer: Self-pay

## 2020-12-08 ENCOUNTER — Other Ambulatory Visit: Payer: Self-pay | Admitting: Family Medicine

## 2020-12-08 DIAGNOSIS — M25552 Pain in left hip: Secondary | ICD-10-CM | POA: Diagnosis not present

## 2020-12-08 DIAGNOSIS — R2689 Other abnormalities of gait and mobility: Secondary | ICD-10-CM

## 2020-12-08 DIAGNOSIS — M5442 Lumbago with sciatica, left side: Secondary | ICD-10-CM | POA: Diagnosis not present

## 2020-12-08 DIAGNOSIS — M6281 Muscle weakness (generalized): Secondary | ICD-10-CM | POA: Diagnosis not present

## 2020-12-08 MED ORDER — TRAMADOL HCL 50 MG PO TABS
50.0000 mg | ORAL_TABLET | Freq: Four times a day (QID) | ORAL | 0 refills | Status: DC | PRN
Start: 2020-12-08 — End: 2020-12-22

## 2020-12-08 NOTE — Therapy (Signed)
[place], [place], [address] Phone: [phone]   Fax:  [phone]  Physical Therapy Treatment  Patient Details  Name: Brittany [name] MRN: [id number] Date of Birth: 04-Sep-1956 Referring Provider (PT): Eunice Blase, MD   Encounter Date: 12/08/2020   PT End of Session - 12/08/20 1348    Visit Number 8    Number of Visits 12    Date for PT Re-Evaluation 12/09/20    Authorization Type MCR/BCBS    Progress Note Due on Visit 10    PT Start Time 1301    PT Stop Time 1345    PT Time Calculation (min) 44 min    Activity Tolerance Patient tolerated treatment well    Behavior During Therapy [facility] for tasks assessed/performed           Past Medical History:  Diagnosis Date  . ANEMIA-NOS   . Asymptomatic varicose veins   . Bone metastases ([place]) 2021  . Breast cancer ([place]) 1996   LEFT BREAST CA  . BREAST CANCER, HX OF 01/1995 dx   s/p Lumpectomy, XRT, chemo and 68yr armidex  . Gestational diabetes mellitus in childbirth, diet controlled 1985  . Hypothyroid 09/28/2015   Dx 09/2015  . Leukopenia    mild since chemo  . OSTEOARTHRITIS   . Personal history of chemotherapy   . Personal history of radiation therapy   . SCOLIOSIS, MILD     Past Surgical History:  Procedure Laterality Date  . BREAST BIOPSY    . BREAST LUMPECTOMY Left    1996  . left lumpectomy  1996  . TONSILLECTOMY    . TOTAL HIP ARTHROPLASTY     2002 & 2003-Dr. vail @ [place]    There were no vitals filed for this visit.   Subjective Assessment - 12/08/20 1329    Subjective relays the pain is a lttle bit better overall and no longer down her leg like it was, it has changed now to just being in low back and her hip.However she still says she cannot stand or walk more than 5 minutes without lots of pain. She also can not go up the stairs without pain    Pertinent History bilat THA, She does have a history of metastatic breast cancer.  She had a bone  scan in September showing stable lesions throughout the spine.    Limitations Lifting;Standing;Walking    Diagnostic tests "Lumbar x-rays reveal slight anterolisthesis of L5 on S1 with facet arthropathy at that level and to a milder degree in the upper levels.  The hip prosthesis looks intact with good alignment, no sign of loosening. "    Pain Onset More than a month ago              [facility] PT Assessment - 12/08/20 0001      Assessment   Medical Diagnosis M54.42 (ICD-10-CM) - Acute left-sided low back pain with left-sided sciatic    Referring Provider (PT) Hilts, Michael, MD      Observation/Other Assessments   Focus on Therapeutic Outcomes (FOTO)  40% functional intake                         [facility] Adult PT Treatment/Exercise - 12/08/20 0001      Lumbar Exercises: Stretches   Double Knee to Chest Stretch 10 seconds    Double Knee to Chest Stretch Limitations 10 reps with feet on pball    Other Lumbar Stretch  Exercise single knee to opposite shoulder 30 sec x 3    Other Lumbar Stretch Exercise seated pball roll outs into fleixon 10 sec X 10      Lumbar Exercises: Aerobic   Recumbent Bike 10 min L4      Lumbar Exercises: Machines for Strengthening   Leg Press SL 56 lbs 3 sets of 10 bilat      Lumbar Exercises: Standing   Other Standing Lumbar Exercises sidestepping and monster walks with green bands around knees X 2 trips    Other Standing Lumbar Exercises hip abd and ext green band X 15 bilat      Manual Therapy   Manual therapy comments theragun to left lateral and posterior hip and lumbar in sidelying 10 min                    PT Short Term Goals - 11/13/20 0947      PT SHORT TERM GOAL #1   Title She will be independent with inital HEP     Baseline no HEP until today    Time 4    Period Weeks    Status Achieved    Target Date 11/11/20             PT Long Term Goals - 10/14/20 1332      PT LONG TERM GOAL #1   Title She will  improve FOTO score to predicted value once has been completed we will know the value.    Time 8    Period Weeks    Status New    Target Date 12/09/20      PT LONG TERM GOAL #2   Title She will report pain improved to no more than 3/10 with ususal activity.    Baseline 9/10 pain    Time 8    Period Weeks    Status New    Target Date 12/09/20      PT LONG TERM GOAL #3   Title She will be able to return to light golf and tennis    Time 8    Period Weeks    Status New      PT LONG TERM GOAL #4   Title Lumbar ROM improved to [facility] with minimal amount of pain    Time 8    Period Weeks    Status New                 Plan - 12/08/20 1351    Clinical Impression Statement Progressed back to some standing activity but her standing tolerance to continues to be limited and she needs seated rest breaks. She has one PT visit left on current plan of care so reassess her to determine continuing vs DC with referral back to MD due to slow progress.    Examination-Activity Limitations Bend;Sleep;Squat;Stairs;Stand;Lift    Examination-Participation Restrictions Cleaning;Community Activity;Shop;Other    Stability/Clinical Decision Making Stable/Uncomplicated    Rehab Potential Good    PT Frequency 2x / week    PT Duration 8 weeks    PT Treatment/Interventions ADLs/Self Care Home Management;Cryotherapy;Electrical Stimulation;Iontophoresis 4mg /ml Dexamethasone;Moist Heat;Traction;Ultrasound;Therapeutic activities;Therapeutic exercise;Balance training;Neuromuscular re-education;Manual techniques;Passive range of motion;Dry needling;Joint Manipulations;Spinal Manipulations;Taping    PT Next Visit Plan reassess for DC vs recert!!!! Flexion based movements, hip strengthening as tolerated, DN again?    PT Home Exercise Plan Access Code: [email or web address]    Consulted and Agree with Plan of Care Patient  Patient will benefit from skilled therapeutic intervention in order to improve the following  deficits and impairments:  Decreased activity tolerance,Decreased balance,Decreased endurance,Decreased range of motion,Decreased strength,Increased edema,Hypomobility,Difficulty walking,Impaired flexibility,Postural dysfunction,Pain  Visit Diagnosis: Acute left-sided low back pain with left-sided sciatica  Pain in left hip  Muscle weakness (generalized)  Other abnormalities of gait and mobility     Problem List Patient Active Problem List   Diagnosis Date Noted  . Lumbar radiculopathy 10/07/2020  . 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  . Malignant neoplasm of central portion of left breast in female, estrogen receptor positive ([place]) 11/22/2019  . Bone metastases ([place]) 11/22/2019  . Anatomical narrow angle glaucoma 09/25/2018  . History of gestational diabetes 09/21/2017  . Raynaud phenomenon 09/21/2016  . 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 Left rotator cuff tear 10/07/2015  . Hypothyroid 09/28/2015  . Leukopenia 12/08/2012  . Metatarsalgia of both feet 09/21/2011  . CERVICALGIA 05/04/2010  . Asymptomatic varicose veins 03/17/2009  . Osteoarthritis 03/17/2009  . SCOLIOSIS, MILD 03/17/2009    Debbe Odea, PT,DPT 12/08/2020, 1:53 PM  [facility] Physical Therapy [address] [place], [place], [address] Phone: [phone]   Fax:  [phone]  Name: Brittany [name] MRN: [id number] Date of Birth: 06/01/1956

## 2020-12-09 ENCOUNTER — Encounter: Payer: Self-pay | Admitting: Physical Medicine and Rehabilitation

## 2020-12-11 ENCOUNTER — Encounter: Payer: Self-pay | Admitting: Physical Therapy

## 2020-12-11 ENCOUNTER — Other Ambulatory Visit: Payer: Self-pay

## 2020-12-11 ENCOUNTER — Ambulatory Visit (INDEPENDENT_AMBULATORY_CARE_PROVIDER_SITE_OTHER): Payer: Medicare Other | Admitting: Physical Therapy

## 2020-12-11 DIAGNOSIS — M6281 Muscle weakness (generalized): Secondary | ICD-10-CM

## 2020-12-11 DIAGNOSIS — R2689 Other abnormalities of gait and mobility: Secondary | ICD-10-CM

## 2020-12-11 DIAGNOSIS — M25552 Pain in left hip: Secondary | ICD-10-CM | POA: Diagnosis not present

## 2020-12-11 DIAGNOSIS — M5442 Lumbago with sciatica, left side: Secondary | ICD-10-CM

## 2020-12-11 NOTE — Therapy (Signed)
[facility] [place], [place], [address] Phone: [phone]   Fax:  [phone]  Physical Therapy Treatment/Recertification  Patient Details  Name: Brittany [name] MRN: [id number] Date of Birth: November 16, 1955 Referring Provider (PT): Eunice Blase, MD   Encounter Date: 12/11/2020   PT End of Session - 12/11/20 1021    Visit Number 9    Number of Visits 18    Date for PT Re-Evaluation 01/13/21    Authorization Type MCR/BCBS    Progress Note Due on Visit 16    PT Start Time 0925    PT Stop Time 1013    PT Time Calculation (min) 48 min    Activity Tolerance Patient tolerated treatment well    Behavior During Therapy [facility] for tasks assessed/performed           Past Medical History:  Diagnosis Date  . ANEMIA-NOS   . Asymptomatic varicose veins   . Bone metastases ([place]) 2021  . Breast cancer ([place]) 1996   LEFT BREAST CA  . BREAST CANCER, HX OF 01/1995 dx   s/p Lumpectomy, XRT, chemo and 37yrarmidex  . Gestational diabetes mellitus in childbirth, diet controlled 1985  . Hypothyroid 09/28/2015   Dx 09/2015  . Leukopenia    mild since chemo  . OSTEOARTHRITIS   . Personal history of chemotherapy   . Personal history of radiation therapy   . SCOLIOSIS, MILD     Past Surgical History:  Procedure Laterality Date  . BREAST BIOPSY    . BREAST LUMPECTOMY Left    1996  . left lumpectomy  1996  . TONSILLECTOMY    . TOTAL HIP ARTHROPLASTY     2002 & 2003-Dr. vail @ [facility]   There were no vitals filed for this visit.   Subjective Assessment - 12/11/20 0927    Subjective Patient believes she is getting a little better she has been walking in intervals. She used her cane the other day for  a longer walk to an event where parking was farther and she did okay with that. She has also been icing a few times a day.    Pertinent History bilat THA, She does have a history of metastatic breast cancer.  She had a bone scan in September  showing stable lesions throughout the spine.    Limitations Lifting;Standing;Walking    Diagnostic tests "Lumbar x-rays reveal slight anterolisthesis of L5 on S1 with facet arthropathy at that level and to a milder degree in the upper levels.  The hip prosthesis looks intact with good alignment, no sign of loosening. "    Currently in Pain? Yes    Pain Score 4     Pain Location Hip    Pain Orientation Left    Pain Descriptors / Indicators Aching    Pain Type Acute pain    Pain Onset More than a month ago            [facility] Adult PT Treatment/Exercise - 12/11/20 0001      Lumbar Exercises: Stretches   Single Knee to Chest Stretch Left;Right;30 seconds;3 reps    Other Lumbar Stretch Exercise single knee to opposite shoulder 30 sec x 2      Lumbar Exercises: Aerobic   Recumbent Bike 10 min L4      Lumbar Exercises: Machines for Strengthening   Leg Press 62 lbs 2x10 bilat, SL 2x10 37 lbs      Lumbar Exercises: Standing   Other Standing Lumbar Exercises  sidestepping and monster walks with green bands around knees X 2 laps    Other Standing Lumbar Exercises hip abd and ext green band X 20 bilat      Lumbar Exercises: Seated   Sit to Stand 20 reps   with stability ball raising overhead   Other Seated Lumbar Exercises clams with yellow ball in between feet green band around knees 2x10, reverse clams with ball in between knees and green band around ankles 2x10    Other Seated Lumbar Exercises seated stability ball rollouts 10x 10 sec      Lumbar Exercises: Supine   Bridge 20 reps;Compliant    Other Supine Lumbar Exercises stability ball double leg rollouts 5 sec flexion hold 10x      Manual Therapy   Manual therapy comments theragun to left lateral and posterior hip and lumbar in sidelying 10 min                    PT Short Term Goals - 12/11/20 1118      PT SHORT TERM GOAL #1   Title She will be independent with inital HEP     Baseline no HEP until today    Time 4     Period Weeks    Status Achieved    Target Date 11/11/20      PT SHORT TERM GOAL #2   Status Achieved             PT Long Term Goals - 12/11/20 1118      PT LONG TERM GOAL #1   Title She will improve FOTO score to predicted value once has been completed we will know the value.    Baseline 40%, predicted 56%    Time 8    Period Weeks    Status On-going      PT LONG TERM GOAL #2   Title She will report pain improved to no more than 3/10 with ususal activity.    Baseline pain has decreased but decreased tolerance for activity    Time 8    Period Weeks    Status On-going      PT LONG TERM GOAL #3   Title She will be able to return to light golf and tennis    Time 8    Period Weeks    Status On-going      PT LONG TERM GOAL #4   Title Lumbar ROM improved to [facility] with minimal amount of pain    Time 8    Period Weeks    Status Partially Met      PT LONG TERM GOAL #5   Status Achieved                 Plan - 12/11/20 1021    Clinical Impression Statement Patient has seen some improvement since last visit. She has been able to walk for slightly longer distances. Today's session focused on gradually increasing WB activities with seated exercise intervals. Advised patient set up 1 appointment for the next 2 weeks to check in. Patient may benefit from skilled PT for additional hip strengthening activities introduced gradually to build up her tolerance for the ability to walk and stand without pain for longer periods of time. Plan for 6 more visit of PT to transition to advanced HEP for ability to reach therapy goals.    Examination-Activity Limitations Bend;Sleep;Squat;Stairs;Stand;Lift    Examination-Participation Restrictions Cleaning;Community Activity;Shop;Other    Stability/Clinical Decision Making Stable/Uncomplicated  Rehab Potential Good    PT Frequency 2x / week    PT Duration 8 weeks    PT Treatment/Interventions ADLs/Self Care Home  Management;Cryotherapy;Electrical Stimulation;Iontophoresis 52m/ml Dexamethasone;Moist Heat;Traction;Ultrasound;Therapeutic activities;Therapeutic exercise;Balance training;Neuromuscular re-education;Manual techniques;Passive range of motion;Dry needling;Joint Manipulations;Spinal Manipulations;Taping    PT Next Visit Plan more standing activity, measure paient tolerance, redo FOTO, dynamic movements    PT Home Exercise Plan Access Code: [email or web address]   Consulted and Agree with Plan of Care Patient           Patient will benefit from skilled therapeutic intervention in order to improve the following deficits and impairments:  Decreased activity tolerance,Decreased balance,Decreased endurance,Decreased range of motion,Decreased strength,Increased edema,Hypomobility,Difficulty walking,Impaired flexibility,Postural dysfunction,Pain  Visit Diagnosis: Pain in left hip  Acute left-sided low back pain with left-sided sciatica  Muscle weakness (generalized)  Other abnormalities of gait and mobility     Problem List Patient Active Problem List   Diagnosis Date Noted  . Lumbar radiculopathy 10/07/2020  . 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  . Malignant ne[place] of central portion of left breast in [place], estrogen receptor positive [place] 11/22/[place]  . [place] 11/22/2019  . Anatomical narrow angle glaucoma 09/25/2018  . History of gestational diabetes 09/21/2017  . Raynaud phenomenon 09/21/2016  . 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 Left rotator cuff tear 10/07/2015  . Hypothyroid 09/28/2015  . Leukopenia 12/08/2012  . Metatarsalgia of both feet 09/21/2011  . CERVICALGIA 05/04/2010  . Asymptomatic varicose veins 03/17/2009  . Osteoarthritis 03/17/2009  . SCOLIOSIS, MILD 03/17/2009    LGlenetta Hew SPT 12/11/2020, 11:24 AM   During this treatment session, this physical therapist was present, participating in and directing the treatment.   This  note has been reviewed and this clinician agrees with the information provided.  BElsie Ra PT, DPT 12/11/20 12:26 PM   [facility] [address] [address]Phone: [phone]  Fax:  [phone] Name: Brittany BELLMOREMRN: [id number] of Birth: 603-07-1956

## 2020-12-13 ENCOUNTER — Encounter: Payer: Self-pay | Admitting: Internal Medicine

## 2020-12-14 MED ORDER — LEVOTHYROXINE SODIUM 50 MCG PO TABS: 50.0000 ug | ORAL_TABLET | Freq: Every day | ORAL | 3 refills | Status: DC

## 2020-12-17 ENCOUNTER — Other Ambulatory Visit: Payer: Self-pay

## 2020-12-17 ENCOUNTER — Ambulatory Visit: Payer: Self-pay

## 2020-12-17 ENCOUNTER — Encounter: Payer: Self-pay | Admitting: Physical Medicine and Rehabilitation

## 2020-12-17 ENCOUNTER — Ambulatory Visit (INDEPENDENT_AMBULATORY_CARE_PROVIDER_SITE_OTHER): Payer: Medicare Other | Admitting: Physical Medicine and Rehabilitation

## 2020-12-17 VITALS — BP 146/82 | HR 74

## 2020-12-17 DIAGNOSIS — M5416 Radiculopathy, lumbar region: Secondary | ICD-10-CM

## 2020-12-17 MED ORDER — METHYLPREDNISOLONE ACETATE 80 MG/ML IJ SUSP
80.0000 mg | Freq: Once | INTRAMUSCULAR | Status: AC
  Administered 2020-12-17: 80 mg

## 2020-12-17 NOTE — Progress Notes (Signed)
Brittany [name] - 65 y.o. female MRN [id number]  Date of birth: Feb 11, 1956  Office Visit Note: Visit Date: 12/17/2020 PCP: Binnie Rail, MD Referred by: Binnie Rail, MD  Subjective: Chief Complaint  Patient presents with  . Lower Back - Pain  . Left Hip - Pain  . Left Leg - Pain   HPI:  Brittany [name] is a 65 y.o. female who comes in today at the request of Dr. Eunice Blase for planned Left L5-S1 Lumbar epidural steroid injection with fluoroscopic guidance.  The patient has failed conservative care including home exercise, medications, time and activity modification.  This injection will be diagnostic and hopefully therapeutic.  Please see requesting physician notes for further details and justification.  Prior L4 transforaminal injection did help her left anterior lateral thigh pain quite a bit but she still having pain in the left posterior lateral hip and shin more of an L5 distribution if it is nerve related.  I did not complete a left L5 transforaminal injection diagnostically and hopefully therapeutically.  She will follow up with Dr. Junius Roads depending on relief to see if this is any more localized to the tensor fascia lata or other musculature or her hip in general.  ROS Otherwise per HPI.  Assessment & Plan: Visit Diagnoses:    ICD-10-CM   1. Lumbar radiculopathy  M54.16 XR C-ARM NO REPORT    Epidural Steroid injection    methylPREDNISolone acetate (DEPO-MEDROL) injection 80 mg    Plan: No additional findings.   Meds & Orders:  Meds ordered this encounter  Medications  . methylPREDNISolone acetate (DEPO-MEDROL) injection 80 mg    Orders Placed This Encounter  Procedures  . XR C-ARM NO REPORT  . Epidural Steroid injection    Follow-up: Return for visit to requesting physician as needed.   Procedures: No procedures performed  Lumbosacral Transforaminal Epidural Steroid Injection - Sub-Pedicular Approach with Fluoroscopic Guidance  Patient: Brittany [name]      Date of Birth: Sep 19, 1956 MRN: [id number] PCP: Binnie Rail, MD      Visit Date: 12/17/2020   Universal Protocol:    Date/Time: 12/17/2020  Consent Given By: the patient  Position: PRONE  Additional Comments: Vital signs were monitored before and after the procedure. Patient was prepped and draped in the usual sterile fashion. The correct patient, procedure, and site was verified.   Injection Procedure Details:   Procedure diagnoses: Lumbar radiculopathy [M54.16]    Meds Administered:  Meds ordered this encounter  Medications  . methylPREDNISolone acetate (DEPO-MEDROL) injection 80 mg    Laterality: Left  Location/Site:  L5-S1  Needle:5.0 in., 22 ga.  Short bevel or Quincke spinal needle  Needle Placement: Transforaminal  Findings:    -Comments: Excellent flow of contrast along the nerve, nerve root and into the epidural space.  Procedure Details: After squaring off the end-plates to get a true AP view, the C-arm was positioned so that an oblique view of the foramen as noted above was visualized. The target area is just inferior to the "nose of the scotty dog" or sub pedicular. The soft tissues overlying this structure were infiltrated with 2-3 ml. of 1% Lidocaine without Epinephrine.  The spinal needle was inserted toward the target using a "trajectory" view along the fluoroscope beam.  Under AP and lateral visualization, the needle was advanced so it did not puncture dura and was located close the 6 O'Clock position of the pedical in AP tracterory. Biplanar projections were used  to confirm position. Aspiration was confirmed to be negative for CSF and/or blood. A 1-2 ml. volume of Isovue-250 was injected and flow of contrast was noted at each level. Radiographs were obtained for documentation purposes.   After attaining the desired flow of contrast documented above, a 0.5 to 1.0 ml test dose of 0.25% Marcaine was injected into each respective transforaminal  space.  The patient was observed for 90 seconds post injection.  After no sensory deficits were reported, and normal lower extremity motor function was noted,   the above injectate was administered so that equal amounts of the injectate were placed at each foramen (level) into the transforaminal epidural space.   Additional Comments:  The patient tolerated the procedure well Dressing: 2 x 2 sterile gauze and Band-Aid    Post-procedure details: Patient was observed during the procedure. Post-procedure instructions were reviewed.  Patient left the clinic in stable condition.      Clinical History: MRI LUMBAR SPINE  Segmentation: Transitional anatomy at the lumbosacral junction when counting from above and assuming 12 rib-bearing thoracic vertebral bodies. For the purposes of this dictation, there is a lumbarized S1.  Alignment:  No significant listhesis.  Vertebrae: Diffuse abnormal signal with areas of low T1 and T2 signal reflecting sclerosis and foci of enhancement. There is no significant epidural disease.  Conus medullaris: Extends to the L2 level and appears normal. No abnormal intrathecal enhancement.  Paraspinal and other soft tissues: Unremarkable.  Disc levels: Multilevel degenerative disc disease with disc bulges and endplate osteophytes. Multilevel facet arthropathy, greatest at L5-S1 where there are joint effusions and extra-spinal synovial cysts. No high-grade canal stenosis identified. Foraminal narrowing is greatest on the left at L5-S1.  IMPRESSION: Motion degraded study.  Diffuse osseous metastatic disease. No significant epidural disease. No recent compression fracture. No evidence of leptomeningeal disease.   Electronically Signed   By: Macy Mis M.D.   On: 11/01/2020 16:14 ------  MRI PELVIS WITHOUT AND WITH CONTRAST  TECHNIQUE: Multiplanar multisequence MR imaging of the pelvis was performed both before and after  administration of intravenous contrast.  CONTRAST: 44mL GADAVIST GADOBUTROL 1 MMOL/ML IV SOLN  COMPARISON: Bone scan dated July 22, 2020. PET-CT dated January 11, 2020.  FINDINGS: Bones: Diffuse marrow heterogeneity throughout the pelvis, proximal femurs, and visualized lower lumbar spine with innumerable small sclerotic foci. There is no evidence of acute fracture, dislocation or avascular necrosis. Prior bilateral hip arthroplasties with associated susceptibility artifact. The visualized sacroiliac joints and symphysis pubis appear normal.  Joint or bursal effusion  Joint effusion: No significant hip joint effusion.  Bursae: Moderate amount of fluid in the right greater than left greater trochanteric bursas.  Muscles and tendons  Muscles and tendons: The visualized gluteus, hamstring and iliopsoas tendons appear normal. No muscle edema. Mild left greater than right gluteus minimus muscle atrophy.  Other findings  Miscellaneous: Mild sigmoid colonic diverticulosis.  IMPRESSION: 1. Widespread osseous metastatic disease. No pathologic fracture. 2. Bilateral total hip arthroplasties. Moderate bilateral greater trochanteric bursitis.   Electronically Signed By: Titus Dubin M.D. On: 11/01/2020 15:48     Objective:  VS:  HT:    WT:   BMI:     BP:(!) 146/82  HR:74bpm  TEMP: ( )  RESP:  Physical Exam Vitals and nursing note reviewed.  Constitutional:      General: She is not in acute distress.    Appearance: Normal appearance. She is not ill-appearing.  HENT:     Head: Normocephalic and atraumatic.  Right Ear: External ear normal.     Left Ear: External ear normal.  Eyes:     Extraocular Movements: Extraocular movements intact.  Cardiovascular:     Rate and Rhythm: Normal rate.     Pulses: Normal pulses.  Pulmonary:     Effort: Pulmonary effort is normal. No respiratory distress.  Abdominal:     General: There is no distension.      Palpations: Abdomen is soft.  Musculoskeletal:        General: Tenderness present.     Cervical back: Neck supple.     Right lower leg: No edema.     Left lower leg: No edema.     Comments: Patient has good distal strength with no pain over the greater trochanters.  No clonus or focal weakness.  No real pain with extension and facet loading  Skin:    Findings: No erythema, lesion or rash.  Neurological:     General: No focal deficit present.     Mental Status: She is alert and oriented to person, place, and time.     Sensory: No sensory deficit.     Motor: No weakness or abnormal muscle tone.     Coordination: Coordination normal.  Psychiatric:        Mood and Affect: Mood normal.        Behavior: Behavior normal.      Imaging: No results found.

## 2020-12-17 NOTE — Progress Notes (Signed)
Pt state lower back pain that travels to her left hip down to her left shin. Pt state walking and standing makes the pain worse. Pt state she take pain meds and uses ice to help ease her pain. Pt has hx of inj on 11/27/20 pt state it help with the pain in her left thigh but pt feels it made her back hurt and her shin still has pain.  Numeric Pain Rating Scale and Functional Assessment Average Pain 6   In the last MONTH (on 0-10 scale) has pain interfered with the following?  1. General activity like being  able to carry out your everyday physical activities such as walking, climbing stairs, carrying groceries, or moving a chair?  Rating(9)   +Driver, -BT, -Dye Allergies.

## 2020-12-17 NOTE — Progress Notes (Signed)
[place]  [phone] [phone]) [phone]     ID: Brittany [name] DOB: 22-Feb-1956  MR#: [id number]  GQQ#:[id number]  Patient Care Team: Binnie Rail, MD as PCP - General (Internal Medicine) Aloha Gell, MD as Consulting Physician (Obstetrics and Gynecology) Sydnee Levans, MD (Dermatology) , Virgie Dad, MD as Consulting Physician (Oncology) Richmond Campbell, MD as Consulting Physician (Gastroenterology) Stefanie Libel, MD as Consulting Physician (Sports Medicine) Eunice Blase, MD (Family Medicine) Erline Levine, MD as Consulting Physician (Neurosurgery) Chauncey Cruel, MD OTHER MD:   CHIEF COMPLAINT: Stage IV lobular breast cancer  CURRENT TREATMENT: Delton See; capecitabine   INTERVAL HISTORY: Brittany [name] returns today for follow up of her stage IV lobular breast cancer.   Since her last visit, she underwent bilateral diagnostic mammography with tomography at [facility] on 11/28/2020 showing: breast density category B; no evidence of malignancy in either breast.  She was restarted on capecitabine at an even lower dose, 1000 mg in the morning only, on 03/25/2020.  She has no mouth sores or diarrhea.  She has no significant palmar plantar erythrodysesthesia.  She does have some cracking in her fingertips and is applying urea cream to her hands and feet.    She began [place] on 12/13/2019.  She is tolerating this with no side effects that she is aware of.  We are following her tumor markers, which continues to improve Lab Results  Component Value Date   CA2729 38.6 11/19/2020   CA2729 37.4 10/22/2020   CA2729 38.4 09/24/2020   CA2729 40.5 (H) 08/27/2020   CA2729 46.5 (H) 07/30/2020   Lab Results  Component Value Date   CEA1 5.39 (H) 11/19/2020   CEA1 6.17 (H) 10/22/2020   CEA1 5.74 (H) 09/24/2020   CEA1 5.33 (H) 08/27/2020   CEA1 6.21 (H) 07/30/2020    REVIEW OF SYSTEMS: Brittany [name] had a nice trip to visit her daughter.  She also met with  Dr.Stern, who told her that she is not a surgical candidate but did get her started on epidural shots.  She received her second shot yesterday and while the first 1 was not very helpful the second 1 seems to be working better.  She has more strength in her left leg.  She still has significant pain in the left hip area.  She takes tramadol 2 or 3 times a day, Tylenol 3 times a day, and Celebrex twice a day.  She is not constipated with these analgesics.   COVID 19 VACCINATION STATUS: Status post Pfizer x2 with booster August 2021   HISTORY OF CURRENT ILLNESS: From the original intake note:  "Brittany [name]" [name] has a history of left breast cancer dating back to 38.  She was followed by Dr. Beryle Beams and underwent lumpectomy, adjuvant chemotherapy, radiation and "hormonal therapy".  Per Brittany [name]'s recollection the tumor was less than 2 cm, 2 out of 10 lymph nodes were involved, and it was a lobular breast cancer.  She took tamoxifen for many years and then raloxifene.  Dr. Beryle Beams also evaluated her for leukopenia which was noted at first in 2011, worked up with a negative ANA and negative rheumatoid factors.  He felt it was a benign residual from her earlier chemotherapy.  She was released from follow-up in 2015.  She has continued on intensified screening because of her family history.  On November 23, 2017 she had bilateral breast MRIs which were unremarkable.  Mammography November 20, 2018 showed no findings suspicious of malignancy.  Breast MRI 10/30/2019  showed a new irregular 0.4 cm enhancing focus in the upper inner quadrant of the right breast.  The left breast continued to be unremarkable except for postoperative changes and there were no abnormal appearing lymph nodes.  However in that same MRI multiple areas of enhancement were noted in the sternum.  This had not been seen in the prior MRI.  Plan right MRI biopsy 11/16/2019 was canceled as the previously demonstrated 0.4 cm focus in the right  breast showed only a tortuous vein at that location.  However bone scan 11/19/2019 obtained to follow-up on the sternal findings showed additional areas of concern at T12, T7-T11, L4 and ribs, as well as the sternum.  The patient's subsequent history is as detailed below.   PAST MEDICAL HISTORY: Past Medical History:  Diagnosis Date  . ANEMIA-NOS   . Asymptomatic varicose veins   . Bone metastases ([place]) 2021  . Breast cancer ([place]) 1996   LEFT BREAST CA  . BREAST CANCER, HX OF 01/1995 dx   s/p Lumpectomy, XRT, chemo and 52yrarmidex  . Gestational diabetes mellitus in childbirth, diet controlled 1985  . Hypothyroid 09/28/2015   Dx 09/2015  . Leukopenia    mild since chemo  . OSTEOARTHRITIS   . Personal history of chemotherapy   . Personal history of radiation therapy   . SCOLIOSIS, MILD     PAST SURGICAL HISTORY: Past Surgical History:  Procedure Laterality Date  . BREAST BIOPSY    . BREAST LUMPECTOMY Left    1996  . left lumpectomy  1996  . TONSILLECTOMY    . TOTAL HIP ARTHROPLASTY     2002 & 2003-Dr. vail @ [facility]    FAMILY HISTORY Family History  Problem Relation Age of Onset  . Heart disease Father 556      AMI  . Hyperlipidemia Father   . Valvular heart disease Father 71 . Parkinson's disease Father 778 . Arthritis Mother   . Breast cancer Mother 542 . Hyperlipidemia Mother   . Osteoporosis Mother   . Pancreatic cancer Maternal Grandmother 73       presumed dx  . Diabetes Paternal Grandfather        type 2  . Breast cancer Maternal Aunt   The patient's father died at the age of 848from heart disease.  He had Lewy body dementia.  The patient's mother is 856years old as of January 2021.  She is in a memory unit.  She has a history of breast cancer diagnosed in her 57s(ductal carcinoma in situ).  The patient's mother sister had breast cancer diagnosed around age 10471  The patient's mother's mother also had cancer, "internal" (possibly ovarian or  gastrointestinal).  There is no cancer on the father's side of the family.   GYNECOLOGIC HISTORY:  No LMP recorded. Patient is postmenopausal. Menarche: 65years old Age at first live birth: 65years old GGenoaP 2 LMP with chemotherapy, in 1996 Contraceptive a little over a year, with no complications HRT no  Hysterectomy?  No Salpingo-oophorectomy?  No   SOCIAL HISTORY:  [place]renag[employer]uated from [employer] a degree in psychology and IT.  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  She and her husband JClair Gullingmet while working for [employer]  [place] probably were exposed to some radiation at that time.  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  They both finally retired 03/28/2020.  Their daughter Brittany [name] is a Pharmacist, hospital in [place].  Their son Brittany [name] works for the same [employer] that the patient works for.  Note that Brittany [name] did the [employer].  The patient has 1 grandchild (in [place]).  The patient attends [employer]: The patient's husband is her healthcare power of attorney   HEALTH MAINTENANCE: Social History   Tobacco Use  . Smoking status: Former Smoker    Quit date: 06/25/1977    Years since quitting: 43.5  . Smokeless tobacco: Never Used  Substance Use Topics  . Alcohol use: Yes    Alcohol/week: 3.0 - 4.0 standard drinks    Types: 3 - 4 Glasses of wine per week  . Drug use: No     Colonoscopy: 2015  PAP: Up-to-date  Bone density: 11/20/2018, normal   No Known Allergies  Current Outpatient Medications  Medication Sig Dispense Refill  . baclofen (LIORESAL) 10 MG tablet Take 1 tablet (10 mg total) by mouth 3 (three) times daily. 30 each 0  . baclofen (LIORESAL) 20 MG tablet Take 1 tablet (20 mg total) by mouth 3 (three) times daily as needed for muscle spasms. 90 tablet 3  . Calcium Citrate 250 MG TABS Take 4 tablets  (1,000 mg total) by mouth daily.  0  . capecitabine (XELODA) 500 MG tablet Take 2 tablets (1,000 mg total) by mouth every morning. 120 tablet 6  . celecoxib (CELEBREX) 200 MG capsule Take 1 capsule (200 mg total) by mouth 2 (two) times daily as needed. 60 capsule 6  . Cholecalciferol (VITAMIN D3) 1000 UNITS CAPS Take by mouth daily.    . Denosumab (XGEVA [place]) Inject into the skin.    Marland Kitchen diclofenac sodium (VOLTAREN) 1 % GEL Apply 2 g topically 3 (three) times daily as needed. 100 g 1  . gabapentin (NEURONTIN) 300 MG capsule Take 1 capsule (300 mg total) by mouth at bedtime. 90 capsule 1  . levothyroxine (SYNTHROID) 50 MCG tablet Take 1 tablet (50 mcg total) by mouth daily. 90 tablet 3  . Multiple Vitamin (MULTIVITAMIN) tablet Take 1 tablet by mouth daily.    . Omega-3 Fatty Acids (FISH OIL) 1000 MG CPDR Take by mouth daily.    . traMADol (ULTRAM) 50 MG tablet Take 1 tablet (50 mg total) by mouth every 6 (six) hours as needed. 30 tablet 0  . traMADol (ULTRAM) 50 MG tablet Take 1 tablet (50 mg total) by mouth every 6 (six) hours as needed. 30 tablet 0   No current facility-administered medications for this visit.    OBJECTIVE:  white woman who appears stated age  65:   12/18/20 1524  BP: 137/79  Pulse: 97  Resp: 18  Temp: 98.1 F (36.7 C)  SpO2: 99%     Body mass index is 22.54 kg/m.   Wt Readings from Last 3 Encounters:  12/18/20 143 lb 14.4 oz (65.3 kg)  11/19/20 140 lb (63.5 kg)  10/22/20 142 lb 11.2 oz (64.7 kg)      ECOG FS:1 - Symptomatic but completely ambulatory  Sclerae unicteric, EOMs intact Wearing a mask No cervical or supraclavicular adenopathy Lungs no rales or rhonchi Heart regular rate and rhythm Abd soft, nontender, positive bowel sounds MSK no focal spinal tenderness, no upper extremity lymphedema Neuro: nonfocal, well oriented, appropriate affect Breasts: The right breast is benign.  The left breast is status post  lumpectomy and radiation with no evidence  of local recurrence. Skin: She has a couple of fairly deep cracks on the pads of 2 fingers on the right hand one on the left.  There is no palmar erythema  LAB RESULTS:  CMP     Component Value Date/Time   NA 130 (L) 12/18/2020 1500   NA 139 12/10/2013 0841   K 4.4 12/18/2020 1500   K 4.5 12/10/2013 0841   CL 95 (L) 12/18/2020 1500   CL 102 12/01/2012 0955   CO2 26 12/18/2020 1500   CO2 28 12/10/2013 0841   GLUCOSE 127 (H) 12/18/2020 1500   GLUCOSE 81 12/10/2013 0841   GLUCOSE 88 12/01/2012 0955   BUN 14 12/18/2020 1500   BUN 14 08/04/2015 0000   BUN 17.5 12/10/2013 0841   CREATININE 0.92 12/18/2020 1500   CREATININE 0.9 12/10/2013 0841   CALCIUM 9.3 12/18/2020 1500   CALCIUM 9.6 12/10/2013 0841   PROT 7.6 12/18/2020 1500   PROT 6.7 12/10/2013 0841   ALBUMIN 4.0 12/18/2020 1500   ALBUMIN 3.9 12/10/2013 0841   AST 28 12/18/2020 1500   AST 22 12/10/2013 0841   ALT 20 12/18/2020 1500   ALT 27 12/10/2013 0841   ALKPHOS 69 12/18/2020 1500   ALKPHOS 54 12/10/2013 0841   BILITOT 0.3 12/18/2020 1500   BILITOT 0.41 12/10/2013 0841   GFRNONAA >60 12/18/2020 1500   GFRAA >60 07/02/2020 1324    No results found for: TOTALPROTELP, ALBUMINELP, A1GS, A2GS, BETS, BETA2SER, GAMS, MSPIKE, SPEI  No results found for: Nils Pyle, [facility]  Lab Results  Component Value Date   WBC 6.3 12/18/2020   NEUTROABS 4.2 12/18/2020   HGB 11.6 (L) 12/18/2020   HCT 33.9 (L) 12/18/2020   MCV 103.4 (H) 12/18/2020   PLT 308 12/18/2020      Chemistry      Component Value Date/Time   NA 130 (L) 12/18/2020 1500   NA 139 12/10/2013 0841   K 4.4 12/18/2020 1500   K 4.5 12/10/2013 0841   CL 95 (L) 12/18/2020 1500   CL 102 12/01/2012 0955   CO2 26 12/18/2020 1500   CO2 28 12/10/2013 0841   BUN 14 12/18/2020 1500   BUN 14 08/04/2015 0000   BUN 17.5 12/10/2013 0841   CREATININE 0.92 12/18/2020 1500   CREATININE 0.9 12/10/2013 0841   GLU 102 08/04/2015 0000      Component  Value Date/Time   CALCIUM 9.3 12/18/2020 1500   CALCIUM 9.6 12/10/2013 0841   ALKPHOS 69 12/18/2020 1500   ALKPHOS 54 12/10/2013 0841   AST 28 12/18/2020 1500   AST 22 12/10/2013 0841   ALT 20 12/18/2020 1500   ALT 27 12/10/2013 0841   BILITOT 0.3 12/18/2020 1500   BILITOT 0.41 12/10/2013 0841      No results found for: LABCA2  No components found for: IEPPIR518  No results for input(s): INR in the last 168 hours.  No results found for: LABCA2  No results found for: CAN199  Lab Results  Component Value Date   CAN125 21.1 11/23/2019    No results found for: ACZ660  Lab Results  Component Value Date   CA2729 38.6 11/19/2020    No components found for: HGQUANT  Lab Results  Component Value Date   CEA1 5.39 (H) 11/19/2020   /  CEA ([facility])  Date Value Ref Range Status  11/19/2020 5.39 (H) 0.00 - 5.00 ng/mL Final    Comment:    (  NOTE) This test was performed using Architect's Chemiluminescent Microparticle Immunoassay. Values obtained from different assay methods cannot be used interchangeably. Please note that 5-10% of patients who smoke may see CEA levels up to 6.9 ng/mL. Performed at [facility], [place] [address], [place], [place] [address]      No results found for: AFPTUMOR  No results found for: [place]   No results found for: HGBA, HGBA2QUANT, HGBFQUANT, HGBSQUAN (Hemoglobinopathy evaluation)   Lab Results  Component Value Date   LDH 193 12/10/2013    No results found for: IRON, TIBC, IRONPCTSAT (Iron and TIBC)  No results found for: FERRITIN  Urinalysis    Component Value Date/Time   COLORURINE YELLOW 09/17/2014 [place] 09/17/2014 0824   LABSPEC 1.015 09/17/2014 0824   PHURINE 7.5 09/17/2014 0824   GLUCOSEU NEGATIVE 09/17/2014 0824   HGBUR NEGATIVE 09/17/2014 0824   BILIRUBINUR NEGATIVE 09/17/2014 0824   KETONESUR NEGATIVE 09/17/2014 0824   UROBILINOGEN 0.2 09/17/2014 0824    NITRITE NEGATIVE 09/17/2014 0824   LEUKOCYTESUR NEGATIVE 09/17/2014 0824    STUDIES: Epidural Steroid injection  Result Date: 12/17/2020 Brittany Sinning, MD     12/17/2020  1:35 PM Lumbosacral Transforaminal Epidural Steroid Injection - Sub-Pedicular Approach with Fluoroscopic Guidance Patient: Brittany [name]     Date of Birth: Feb 25, 1956 MRN: [id number] PCP: Binnie Rail, MD     Visit Date: 12/17/2020  Universal Protocol:   Date/Time: 12/17/2020 Consent Given By: the patient Position: PRONE Additional Comments: Vital signs were monitored before and after the procedure. Patient was prepped and draped in the usual sterile fashion. The correct patient, procedure, and site was verified. Injection Procedure Details: Procedure diagnoses: Lumbar radiculopathy [M54.16]  Meds Administered: Meds ordered this encounter Medications . methylPREDNISolone acetate (DEPO-MEDROL) injection 80 mg Laterality: Left Location/Site: L5-S1 Needle:5.0 in., 22 ga.  Short bevel or Quincke spinal needle Needle Placement: Transforaminal Findings:   -Comments: Excellent flow of contrast along the nerve, nerve root and into the epidural space. Procedure Details: After squaring off the end-plates to get a true AP view, the C-arm was positioned so that an oblique view of the foramen as noted above was visualized. The target area is just inferior to the "nose of the scotty dog" or sub pedicular. The soft tissues overlying this structure were infiltrated with 2-3 ml. of 1% Lidocaine without Epinephrine. The spinal needle was inserted toward the target using a "trajectory" view along the fluoroscope beam.  Under AP and lateral visualization, the needle was advanced so it did not puncture dura and was located close the 6 O'Clock position of the pedical in AP tracterory. Biplanar projections were used to confirm position. Aspiration was confirmed to be negative for CSF and/or blood. A 1-2 ml. volume of Isovue-250 was injected and flow of  contrast was noted at each level. Radiographs were obtained for documentation purposes. After attaining the desired flow of contrast documented above, a 0.5 to 1.0 ml test dose of 0.25% Marcaine was injected into each respective transforaminal space.  The patient was observed for 90 seconds post injection.  After no sensory deficits were reported, and normal lower extremity motor function was noted,   the above injectate was administered so that equal amounts of the injectate were placed at each foramen (level) into the transforaminal epidural space. Additional Comments: The patient tolerated the procedure well Dressing: 2 x 2 sterile gauze and Band-Aid  Post-procedure details: Patient was observed during the procedure. Post-procedure instructions were reviewed.  Patient left the clinic in stable condition.   Epidural Steroid injection  Result Date: 11/27/2020 Brittany Sinning, MD     12/01/2020 12:43 PM Lumbosacral Transforaminal Epidural Steroid Injection - Sub-Pedicular Approach with Fluoroscopic Guidance Patient: Brittany [name]     Date of Birth: 06/08/1956 MRN: [id number] PCP: Binnie Rail, MD     Visit Date: 11/27/2020  Universal Protocol:   Date/Time: 11/27/2020 Consent Given By: the patient Position: PRONE Additional Comments: Vital signs were monitored before and after the procedure. Patient was prepped and draped in the usual sterile fashion. The correct patient, procedure, and site was verified. Injection Procedure Details: Procedure diagnoses: Lumbar radiculopathy [M54.16]  Meds Administered: Meds ordered this encounter Medications . betamethasone acetate-betamethasone sodium phosphate (CELESTONE) injection 12 mg Laterality: Left Location/Site: L4-L5 Needle:5.0 in., 22 ga.  Short bevel or Quincke spinal needle Needle Placement: Transforaminal Findings:   -Comments: Excellent flow of contrast along the nerve, nerve root and into the epidural space. Procedure Details: After squaring off the end-plates to  get a true AP view, the C-arm was positioned so that an oblique view of the foramen as noted above was visualized. The target area is just inferior to the "nose of the scotty dog" or sub pedicular. The soft tissues overlying this structure were infiltrated with 2-3 ml. of 1% Lidocaine without Epinephrine. The spinal needle was inserted toward the target using a "trajectory" view along the fluoroscope beam.  Under AP and lateral visualization, the needle was advanced so it did not puncture dura and was located close the 6 O'Clock position of the pedical in AP tracterory. Biplanar projections were used to confirm position. Aspiration was confirmed to be negative for CSF and/or blood. A 1-2 ml. volume of Isovue-250 was injected and flow of contrast was noted at each level. Radiographs were obtained for documentation purposes. After attaining the desired flow of contrast documented above, a 0.5 to 1.0 ml test dose of 0.25% Marcaine was injected into each respective transforaminal space.  The patient was observed for 90 seconds post injection.  After no sensory deficits were reported, and normal lower extremity motor function was noted,   the above injectate was administered so that equal amounts of the injectate were placed at each foramen (level) into the transforaminal epidural space. Additional Comments: The patient tolerated the procedure well Dressing: 2 x 2 sterile gauze and Band-Aid  Post-procedure details: Patient was observed during the procedure. Post-procedure instructions were reviewed. Patient left the clinic in stable condition.   MM DIAG BREAST TOMO BILATERAL  Result Date: 11/28/2020 CLINICAL DATA:  History of a left lumpectomy for breast carcinoma performed in 1996. More recently the patient has had imaging demonstrating bone lesions consistent with metastatic disease. The patient underwent breast MRI on 10/30/2019, where a sternal lesion was noted as well as a small enhancing lesion in the right  breast, which was not reproduced at the time of planned MRI guided biopsy on 11/16/2019. EXAM: DIGITAL DIAGNOSTIC BILATERAL MAMMOGRAM WITH TOMOSYNTHESIS AND CAD TECHNIQUE: Bilateral digital diagnostic mammography and breast tomosynthesis was performed. Digital images of the breasts were evaluated with computer-aided detection. COMPARISON:  Previous exam(s). ACR Breast Density Category b: There are scattered areas of fibroglandular density. FINDINGS: Stable post lumpectomy changes are noted on the left. There are no masses, areas of nonsurgical architectural distortion or suspicious calcifications. No mammographic change. IMPRESSION: 1. No evidence of new or recurrent breast carcinoma. 2. Benign post lumpectomy changes on the left. RECOMMENDATION: Screening mammogram in one year.(Code:[id number]-B-01Y)  I have discussed the findings and recommendations with the patient. If applicable, a reminder letter will be sent to the patient regarding the next appointment. BI-RADS CATEGORY  2: Benign. Electronically Signed   By: Lajean Manes M.D.   On: 11/28/2020 08:25   XR C-ARM NO REPORT  Result Date: 12/17/2020 Please see Notes tab for imaging impression.  XR C-ARM NO REPORT  Result Date: 11/27/2020 Please see Notes tab for imaging impression.    ELIGIBLE FOR AVAILABLE RESEARCH PROTOCOL: no  ASSESSMENT: 65 y.o. [place] woman status post left lumpectomy 1996 for a T1 N1, anatomic stage II invasive lobular breast cancer, estrogen receptor positive  (a) s/p adjuvant chemotherapy with doxorubicin and cyclophosphamide x4  (b) status post adjuvant radiation  (c) status post tamoxifen for 5+ years  (1) benign leukopenia: noted 2011, felt secondary to prior chemotherapy  (2) genetics testing 06/23/2018 through [facility] Multi[facility]cer panel found no deleterious mutations in the 84 genes tested including BRCA 1-2  (a) 2 variants of uncertain significance were fund in [place] [c.1037C>T (p.Ser346Phe) and c.503C>G  (p.ALA168Gly)  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, 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 (3) staging studies:  (a) breast MRI 10/30/2019 shows sternal mottling  (b) bone scan 11/19/2019 shows multiple areas of uptake suspicious for metastatic disease  (c) CT of the chest abdomen and pelvis 11/28/2019 shows no visceral disease  (d) tumor markers 11/23/2019 shows a CEA of 18.11, CA 27-29 of 61.2, CA 125 of 21.1  (e) F-18-estradiol PET (cerianna) 01/11/2020 shows multiple sclerotic bone lesions which do not accumulate the estrogen receptor specific radiotracer  (f) bone marrow biopsy 01/25/2020 confirms metastatic breast cancer, gross cystic disease fluid protein positive, but estrogen and progesterone receptor negative.  HER-2/neu was 2+ on immunohistochemistry negative by FISH (1.35/1.70).  (g) CARIS confirms triple negative disease, finds a positive androgen receptor at 3+, 70%; and the PIK3CA was indeterminate.  PTEN was positive by IHC at 90%.  PD-L1 was negative.  Genomic LOH was low and MSI was stable with proficient mismatch mismatch repair.  BRCA 1 and 2 were negative.  (4) denosumab/Xgeva started 12/13/2019, repeated monthly  (5) metronomic capecitabine started 02/25/2020, with significant plantar dysesthesia developing after 1 week, drug held 03/12/2020  (a) capecitabine restarted 03/25/2020 at 1000 mg in the morning only.  (b) bone scan 07/23/2020 shows no evidence of disease progression   (c) MRI of the total spine and pelvis 11/01/2020 shows multiple bone lesions but no epidural encroachment, no compression fracture and no evidence for lymphangitic spread of disease; there is significant degenerative disease  (d) other options include CMF, sacituzumab govitecan, bicalutamide   PLAN: Brittany [name] is now a little over a year out from definitive diagnosis of metastatic breast cancer.  There has been no evidence of visceral disease so far and we have evidence of good control with continuing decline in her  tumor markers.  She is due for a repeat bone scan in March which we will obtain shortly before her next Xgeva dose.  She is getting some benefit from the epidural treatments she is receiving.  She is still very limited in how far she can go.  I think she will benefit from a wheelchair particularly when they go out to eat or to a show and I have written her a prescription for that.  She is working with Dr. Melven Sartorius group to gain some control over the significant degenerative disease in her back.  She is on adequate pain medicine with good bowel prophylaxis  She will  see me again in 1 month.  She knows to call for any other issue that may develop before that visit.  Total encounter time 25 minutes.Sarajane Jews C. , MD 12/18/20 5:07 PM Medical Oncology and Hematology [facility] [place], [place] [address] Tel. [phone]    Fax. [phone]   I, Wilburn Mylar, am acting as scribe for Dr. Virgie Dad. .  I, Lurline Del MD, have reviewed the above documentation for accuracy and completeness, and I agree with the above.   *Total Encounter Time as defined by the Centers for Medicare and Medicaid Services includes, in addition to the face-to-face time of a patient visit (documented in the note above) non-face-to-face time: obtaining and reviewing outside history, ordering and reviewing medications, tests or procedures, care coordination (communications with other health care professionals or caregivers) and documentation in the medical record.

## 2020-12-17 NOTE — Patient Instructions (Signed)

## 2020-12-17 NOTE — Procedures (Signed)
Lumbosacral Transforaminal Epidural Steroid Injection - Sub-Pedicular Approach with Fluoroscopic Guidance  Patient: Brittany [name]      Date of Birth: [birth year] MRN: [id number] PCP: Binnie Rail, MD      Visit Date: 12/17/2020   Universal Protocol:    Date/Time: 12/17/2020  Consent Given By: the patient  Position: PRONE  Additional Comments: Vital signs were monitored before and after the procedure. Patient was prepped and draped in the usual sterile fashion. The correct patient, procedure, and site was verified.   Injection Procedure Details:   Procedure diagnoses: Lumbar radiculopathy [M54.16]    Meds Administered:  Meds ordered this encounter  Medications  . methylPREDNISolone acetate (DEPO-MEDROL) injection 80 mg    Laterality: Left  Location/Site:  L5-S1  Needle:5.0 in., 22 ga.  Short bevel or Quincke spinal needle  Needle Placement: Transforaminal  Findings:    -Comments: Excellent flow of contrast along the nerve, nerve root and into the epidural space.  Procedure Details: After squaring off the end-plates to get a true AP view, the C-arm was positioned so that an oblique view of the foramen as noted above was visualized. The target area is just inferior to the "nose of the scotty dog" or sub pedicular. The soft tissues overlying this structure were infiltrated with 2-3 ml. of 1% Lidocaine without Epinephrine.  The spinal needle was inserted toward the target using a "trajectory" view along the fluoroscope beam.  Under AP and lateral visualization, the needle was advanced so it did not puncture dura and was located close the 6 O'Clock position of the pedical in AP tracterory. Biplanar projections were used to confirm position. Aspiration was confirmed to be negative for CSF and/or blood. A 1-2 ml. volume of Isovue-250 was injected and flow of contrast was noted at each level. Radiographs were obtained for documentation purposes.   After attaining the desired  flow of contrast documented above, a 0.5 to 1.0 ml test dose of 0.25% Marcaine was injected into each respective transforaminal space.  The patient was observed for 90 seconds post injection.  After no sensory deficits were reported, and normal lower extremity motor function was noted,   the above injectate was administered so that equal amounts of the injectate were placed at each foramen (level) into the transforaminal epidural space.   Additional Comments:  The patient tolerated the procedure well Dressing: 2 x 2 sterile gauze and Band-Aid    Post-procedure details: Patient was observed during the procedure. Post-procedure instructions were reviewed.  Patient left the clinic in stable condition.

## 2020-12-18 ENCOUNTER — Ambulatory Visit: Payer: Medicare Other

## 2020-12-18 ENCOUNTER — Other Ambulatory Visit: Payer: Self-pay

## 2020-12-18 ENCOUNTER — Inpatient Hospital Stay: Payer: Medicare Other | Attending: Oncology

## 2020-12-18 ENCOUNTER — Ambulatory Visit: Payer: Medicare Other | Admitting: Oncology

## 2020-12-18 ENCOUNTER — Inpatient Hospital Stay: Payer: Medicare Other

## 2020-12-18 ENCOUNTER — Other Ambulatory Visit: Payer: Medicare Other

## 2020-12-18 ENCOUNTER — Inpatient Hospital Stay (HOSPITAL_BASED_OUTPATIENT_CLINIC_OR_DEPARTMENT_OTHER): Payer: Medicare Other | Admitting: Oncology

## 2020-12-18 VITALS — BP 137/79 | HR 97 | Temp 98.1°F | Resp 18 | Ht 67.0 in | Wt 143.9 lb

## 2020-12-18 VITALS — BP 134/84 | HR 84 | Temp 99.0°F | Resp 18

## 2020-12-18 DIAGNOSIS — I7300 Raynaud's syndrome without gangrene: Secondary | ICD-10-CM

## 2020-12-18 DIAGNOSIS — Z170 Estrogen receptor positive status [ER+]: Secondary | ICD-10-CM

## 2020-12-18 DIAGNOSIS — C7951 Secondary malignant neoplasm of bone: Secondary | ICD-10-CM | POA: Diagnosis present

## 2020-12-18 DIAGNOSIS — C50112 Malignant neoplasm of central portion of left female breast: Secondary | ICD-10-CM

## 2020-12-18 DIAGNOSIS — Z171 Estrogen receptor negative status [ER-]: Secondary | ICD-10-CM | POA: Diagnosis not present

## 2020-12-18 DIAGNOSIS — Z96643 Presence of artificial hip joint, bilateral: Secondary | ICD-10-CM

## 2020-12-18 DIAGNOSIS — Q6589 Other specified congenital deformities of hip: Secondary | ICD-10-CM

## 2020-12-18 DIAGNOSIS — M160 Bilateral primary osteoarthritis of hip: Secondary | ICD-10-CM

## 2020-12-18 DIAGNOSIS — H40039 Anatomical narrow angle, unspecified eye: Secondary | ICD-10-CM

## 2020-12-18 LAB — CBC WITH DIFFERENTIAL/PLATELET
Abs Immature Granulocytes: 0.02 10*3/uL (ref 0.00–0.07)
Basophils Absolute: 0 10*3/uL (ref 0.0–0.1)
Basophils Relative: 0 %
Eosinophils Absolute: 0 10*3/uL (ref 0.0–0.5)
Eosinophils Relative: 0 %
HCT: 33.9 % — ABNORMAL LOW (ref 36.0–46.0)
Hemoglobin: 11.6 g/dL — ABNORMAL LOW (ref 12.0–15.0)
Immature Granulocytes: 0 %
Lymphocytes Relative: 27 %
Lymphs Abs: 1.7 10*3/uL (ref 0.7–4.0)
MCH: 35.4 pg — ABNORMAL HIGH (ref 26.0–34.0)
MCHC: 34.2 g/dL (ref 30.0–36.0)
MCV: 103.4 fL — ABNORMAL HIGH (ref 80.0–100.0)
Monocytes Absolute: 0.4 10*3/uL (ref 0.1–1.0)
Monocytes Relative: 7 %
Neutro Abs: 4.2 10*3/uL (ref 1.7–7.7)
Neutrophils Relative %: 66 %
Platelets: 308 10*3/uL (ref 150–400)
RBC: 3.28 MIL/uL — ABNORMAL LOW (ref 3.87–5.11)
RDW: 13.9 % (ref 11.5–15.5)
WBC: 6.3 10*3/uL (ref 4.0–10.5)
nRBC: 0 % (ref 0.0–0.2)

## 2020-12-18 LAB — COMPREHENSIVE METABOLIC PANEL
ALT: 20 U/L (ref 0–44)
AST: 28 U/L (ref 15–41)
Albumin: 4 g/dL (ref 3.5–5.0)
Alkaline Phosphatase: 69 U/L (ref 38–126)
Anion gap: 9 (ref 5–15)
BUN: 14 mg/dL (ref 8–23)
CO2: 26 mmol/L (ref 22–32)
Calcium: 9.3 mg/dL (ref 8.9–10.3)
Chloride: 95 mmol/L — ABNORMAL LOW (ref 98–111)
Creatinine, Ser: 0.92 mg/dL (ref 0.44–1.00)
GFR, Estimated: >60 mL/min (ref >60)
Glucose, Bld: 127 mg/dL — ABNORMAL HIGH (ref 70–99)
Potassium: 4.4 mmol/L (ref 3.5–5.1)
Sodium: 130 mmol/L — ABNORMAL LOW (ref 135–145)
Total Bilirubin: 0.3 mg/dL (ref 0.3–1.2)
Total Protein: 7.6 g/dL (ref 6.5–8.1)

## 2020-12-18 MED ORDER — DENOSUMAB 120 MG/1.7ML ~~LOC~~ SOLN
120.0000 mg | Freq: Once | SUBCUTANEOUS | Status: AC
  Administered 2020-12-18: 120 mg via SUBCUTANEOUS

## 2020-12-18 MED ORDER — DENOSUMAB 120 MG/1.7ML ~~LOC~~ SOLN
SUBCUTANEOUS | Status: AC
  Filled 2020-12-18: qty 1.7

## 2020-12-18 NOTE — Patient Instructions (Signed)
Denosumab injection What is this medicine? DENOSUMAB (den oh sue mab) slows bone breakdown. Prolia is used to treat osteoporosis in women after menopause and in men, and in people who are taking corticosteroids for 6 months or more. Xgeva is used to treat a high calcium level due to cancer and to prevent bone fractures and other bone problems caused by multiple myeloma or cancer bone metastases. Xgeva is also used to treat giant cell tumor of the bone. This medicine may be used for other purposes; ask your health care provider or pharmacist if you have questions. COMMON BRAND NAME(S): Prolia, XGEVA What should I tell my health care provider before I take this medicine? They need to know if you have any of these conditions:  dental disease  having surgery or tooth extraction  infection  kidney disease  low levels of calcium or Vitamin D in the blood  malnutrition  on hemodialysis  skin conditions or sensitivity  thyroid or parathyroid disease  an unusual reaction to denosumab, other medicines, foods, dyes, or preservatives  pregnant or trying to get pregnant  breast-feeding How should I use this medicine? This medicine is for injection under the skin. It is given by a health care professional in a hospital or clinic setting. A special MedGuide will be given to you before each treatment. Be sure to read this information carefully each time. For Prolia, talk to your pediatrician regarding the use of this medicine in children. Special care may be needed. For Xgeva, talk to your pediatrician regarding the use of this medicine in children. While this drug may be prescribed for children as young as 13 years for selected conditions, precautions do apply. Overdosage: If you think you have taken too much of this medicine contact a poison control center or emergency room at once. NOTE: This medicine is only for you. Do not share this medicine with others. What if I miss a dose? It is  important not to miss your dose. Call your doctor or health care professional if you are unable to keep an appointment. What may interact with this medicine? Do not take this medicine with any of the following medications:  other medicines containing denosumab This medicine may also interact with the following medications:  medicines that lower your chance of fighting infection  steroid medicines like prednisone or cortisone This list may not describe all possible interactions. Give your health care provider a list of all the medicines, herbs, non-prescription drugs, or dietary supplements you use. Also tell them if you smoke, drink alcohol, or use illegal drugs. Some items may interact with your medicine. What should I watch for while using this medicine? Visit your doctor or health care professional for regular checks on your progress. Your doctor or health care professional may order blood tests and other tests to see how you are doing. Call your doctor or health care professional for advice if you get a fever, chills or sore throat, or other symptoms of a cold or flu. Do not treat yourself. This drug may decrease your body's ability to fight infection. Try to avoid being around people who are sick. You should make sure you get enough calcium and vitamin D while you are taking this medicine, unless your doctor tells you not to. Discuss the foods you eat and the vitamins you take with your health care professional. See your dentist regularly. Brush and floss your teeth as directed. Before you have any dental work done, tell your dentist you are   receiving this medicine. Do not become pregnant while taking this medicine or for 5 months after stopping it. Talk with your doctor or health care professional about your birth control options while taking this medicine. Women should inform their doctor if they wish to become pregnant or think they might be pregnant. There is a potential for serious side  effects to an unborn child. Talk to your health care professional or pharmacist for more information. What side effects may I notice from receiving this medicine? Side effects that you should report to your doctor or health care professional as soon as possible:  allergic reactions like skin rash, itching or hives, swelling of the face, lips, or tongue  bone pain  breathing problems  dizziness  jaw pain, especially after dental work  redness, blistering, peeling of the skin  signs and symptoms of infection like fever or chills; cough; sore throat; pain or trouble passing urine  signs of low calcium like fast heartbeat, muscle cramps or muscle pain; pain, tingling, numbness in the hands or feet; seizures  unusual bleeding or bruising  unusually weak or tired Side effects that usually do not require medical attention (report to your doctor or health care professional if they continue or are bothersome):  constipation  diarrhea  headache  joint pain  loss of appetite  muscle pain  runny nose  tiredness  upset stomach This list may not describe all possible side effects. Call your doctor for medical advice about side effects. You may report side effects to FDA at 1-800-FDA-1088. Where should I keep my medicine? This medicine is only given in a clinic, doctor's office, or other health care setting and will not be stored at home. NOTE: This sheet is a summary. It may not cover all possible information. If you have questions about this medicine, talk to your doctor, pharmacist, or health care provider.  2021 Elsevier/Gold Standard (2018-02-17 16:10:44)

## 2020-12-19 ENCOUNTER — Encounter: Payer: Self-pay | Admitting: Oncology

## 2020-12-19 ENCOUNTER — Telehealth: Payer: Self-pay | Admitting: Oncology

## 2020-12-19 LAB — CANCER ANTIGEN 27.29: CA 27.29: 31.6 U/mL (ref 0.0–38.6)

## 2020-12-19 LAB — CEA (IN HOUSE-CHCC): CEA (CHCC-In House): 5.21 ng/mL — ABNORMAL HIGH (ref 0.00–5.00)

## 2020-12-19 NOTE — Telephone Encounter (Signed)
Rescheduled appts per 2/24 los and added office visit. Pt confirmed new appt date and time.

## 2020-12-22 ENCOUNTER — Other Ambulatory Visit: Payer: Self-pay | Admitting: Family Medicine

## 2020-12-22 MED ORDER — TRAMADOL HCL 50 MG PO TABS: 50.0000 mg | ORAL_TABLET | Freq: Four times a day (QID) | ORAL | 0 refills | Status: DC | PRN

## 2020-12-23 ENCOUNTER — Other Ambulatory Visit: Payer: Self-pay

## 2020-12-23 ENCOUNTER — Encounter: Payer: Self-pay | Admitting: Rehabilitative and Restorative Service Providers"

## 2020-12-23 ENCOUNTER — Ambulatory Visit (INDEPENDENT_AMBULATORY_CARE_PROVIDER_SITE_OTHER): Payer: Medicare Other | Admitting: Rehabilitative and Restorative Service Providers"

## 2020-12-23 DIAGNOSIS — M25552 Pain in left hip: Secondary | ICD-10-CM | POA: Diagnosis not present

## 2020-12-23 DIAGNOSIS — M5442 Lumbago with sciatica, left side: Secondary | ICD-10-CM

## 2020-12-23 DIAGNOSIS — R2689 Other abnormalities of gait and mobility: Secondary | ICD-10-CM

## 2020-12-23 DIAGNOSIS — M6281 Muscle weakness (generalized): Secondary | ICD-10-CM | POA: Diagnosis not present

## 2020-12-23 NOTE — Therapy (Signed)
Brittany [name], [place], [address] Phone: [phone]   Fax:  [phone]  Physical Therapy Treatment    Patient Details  Name: Brittany [name] MRN: [id number] Date of Birth: 23-Oct-1956 Referring Provider (PT): Eunice Blase, MD   Encounter Date: 12/23/2020   PT End of Session - 12/23/20 1608    Visit Number 10    Number of Visits 18    Date for PT Re-Evaluation 01/13/21    Authorization Type MCR/BCBS    Progress Note Due on Visit 18    PT Start Time 1604    PT Stop Time 1645    PT Time Calculation (min) 41 min    Activity Tolerance Patient tolerated treatment well    Behavior During Therapy [facility] for tasks assessed/performed           Past Medical History:  Diagnosis Date  . ANEMIA-NOS   . Asymptomatic varicose veins   . Bone metastases ([place]) 2021  . Breast cancer ([place]) 1996   LEFT BREAST CA  . BREAST CANCER, HX OF 01/1995 dx   s/p Lumpectomy, XRT, chemo and 48yrarmidex  . Gestational diabetes mellitus in childbirth, diet controlled 1985  . Hypothyroid 09/28/2015   Dx 09/2015  . Leukopenia    mild since chemo  . OSTEOARTHRITIS   . Personal history of chemotherapy   . Personal history of radiation therapy   . SCOLIOSIS, MILD     Past Surgical History:  Procedure Laterality Date  . BREAST BIOPSY    . BREAST LUMPECTOMY Left    1996  . left lumpectomy  1996  . TONSILLECTOMY    . TOTAL HIP ARTHROPLASTY     2002 & 2003-[facility] vail [facility]   There were no vitals filed for this visit.   Subjective Assessment - 12/23/20 1559    Subjective Patient had spinal epidural on Wednesday and noticed an improvement in symptoms but is still having trouble with her standing tolerance.    Pertinent History bilat THA, She does have a history of metastatic breast cancer.  She had a bone scan in September showing stable lesions throughout the spine.    Limitations Lifting;Standing;Walking    How long can you stand  comfortably? 30 minutes max    How long can you walk comfortably? 30 minutes max    Diagnostic tests "Lumbar x-rays reveal slight anterolisthesis of L5 on S1 with facet arthropathy at that level and to a milder degree in the upper levels.  The hip prosthesis looks intact with good alignment, no sign of loosening. "    Currently in Pain? Yes    Pain Score 4     Pain Descriptors / Indicators Aching    Pain Type Acute pain    Pain Radiating Towards radiating symptoms hve improved    Pain Onset More than a month ago    Pain Frequency Constant    Aggravating Factors  walking standing    Pain Relieving Factors ice helps, using rollator at home    Effect of Pain on Daily Activities unable to stand or do long activities                             [facility] 12/24/20 0001      Lumbar Exercises: Standing   Other Standing Lumbar Exercises lumbar extension x 10      Manual Therapy   Manual therapy comments  compression to Lt glute med near iliac crest middle and posterior aspect            Trigger Point Dry Needling - 12/24/20 0001    Consent Given? Yes    Education Handout Provided Previously provided    Muscles Treated Back/Hip Gluteus medius    Gluteus Medius Response Twitch response elicited                PT Education - 12/23/20 1546    Education Details correct posture and height for rollator at home    Person(s) Educated Patient    Methods Explanation    Comprehension Verbalized understanding;Other (comment)   Told pt. to bring in rollator if she cannot get height adjusted for better posture           PT Short Term Goals - 12/11/20 1118      PT SHORT TERM GOAL #1   Title She will be independent with inital HEP     Baseline no HEP until today    Time 4    Period Weeks    Status Achieved    Target Date 11/11/20      PT SHORT TERM GOAL #2   Status Achieved             PT Long Term Goals - 12/23/20 1655      PT  LONG TERM GOAL #1   Title She will improve FOTO score to predicted value once has been completed we will know the value.    Baseline 40%, predicted 56%    Time 8    Period Weeks    Status On-going      PT LONG TERM GOAL #2   Title She will report pain improved to no more than 3/10 with ususal activity.    Baseline pain has decreased but decreased tolerance for activity    Time 8    Period Weeks    Status On-going      PT LONG TERM GOAL #3   Title She will be able to return to light golf and tennis    Baseline unable to return thus far    Time 8    Period Weeks    Status On-going      PT LONG TERM GOAL #4   Title Lumbar ROM improved to [facility] with minimal amount of pain    Baseline ROM is increasing slowly    Time 8    Period Weeks    Status Partially Met      PT LONG TERM GOAL #5   Status Achieved                 Plan - 12/23/20 1650    Clinical Impression Statement Patient has had some relief in her LE symptoms since her epidural on Wednesday. Her radiating symptoms have decreased but her hip and back pain remain. Her stnading tolerance is still highly limited to the point where she uses a rollator in her home for walking more than 30 minutes. She has increased her motion and her ability to tolerate strenghtening activities to meet her goals but still requires a graded exercise program to meet these goal gradually. Patient tolerated DN well today and started work on postural control for more neutral standing and sitting and increased lumbar extension movement.    Examination-Activity Limitations Bend;Sleep;Squat;Stairs;Stand;Lift    Examination-Participation Restrictions Cleaning;Community Activity;Shop;Other    Stability/Clinical Decision Making Stable/Uncomplicated    Rehab Potential Good    PT  Frequency 2x / week    PT Duration 8 weeks    PT Treatment/Interventions ADLs/Self Care Home Management;Cryotherapy;Electrical Stimulation;Iontophoresis 72m/ml  Dexamethasone;Moist Heat;Traction;Ultrasound;Therapeutic activities;Therapeutic exercise;Balance training;Neuromuscular re-education;Manual techniques;Passive range of motion;Dry needling;Joint Manipulations;Spinal Manipulations;Taping    PT Next Visit Plan make goal for sit to stand/balance, redo FOTO, increase standing tolerance    PT Home Exercise Plan Access Code: [email or web address]   Consulted and Agree with Plan of Care Patient           Patient will benefit from skilled therapeutic intervention in order to improve the following deficits and impairments:  Decreased activity tolerance,Decreased balance,Decreased endurance,Decreased range of motion,Decreased strength,Increased edema,Hypomobility,Difficulty walking,Impaired flexibility,Postural dysfunction,Pain  Visit Diagnosis: Pain in left hip  Acute left-sided low back pain with left-sided sciatica  Muscle weakness (generalized)  Other abnormalities of gait and mobility     Problem List Patient Active Problem List   Diagnosis Date Noted  . Lumbar radiculopathy 10/07/2020  . 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  . 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 [place] 11/22/2019  . Anatomical narrow angle glaucoma 09/25/2018  . History of gestational diabetes 09/21/2017  . Raynaud phenomenon 09/21/2016  . 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 Left rotator cuff tear 10/07/2015  . Hypothyroid 09/28/2015  . Leukopenia 12/08/2012  . Metatarsalgia of both feet 09/21/2011  . CERVICALGIA 05/04/2010  . Asymptomatic varicose veins 03/17/2009  . Osteoarthritis 03/17/2009  . SCOLIOSIS, MILD 03/17/2009    LGlenetta Hew SPT 12/23/2020 4:00pm   This entire session of physical therapy was performed  under the direct supervision of PT signing evaluation /treatment. PT reviewed note and agrees.  MScot Jun PT, DPT, OCS,  ATC 12/24/20  9:44 AM    [facility] [address] [address]Phone: [phone]  Fax:  [phone] Name: DSHAYLA HEMINGMRN: [id number] of Birth: 610-Mar-1957

## 2020-12-29 ENCOUNTER — Encounter: Payer: Self-pay | Admitting: Family Medicine

## 2020-12-29 MED ORDER — GABAPENTIN 300 MG PO CAPS: 300.0000 mg | ORAL_CAPSULE | Freq: Two times a day (BID) | ORAL | 1 refills | Status: DC

## 2020-12-29 MED ORDER — GABAPENTIN 300 MG PO CAPS: 300.0000 mg | ORAL_CAPSULE | Freq: Every day | ORAL | 1 refills | Status: DC

## 2020-12-30 ENCOUNTER — Ambulatory Visit (INDEPENDENT_AMBULATORY_CARE_PROVIDER_SITE_OTHER): Payer: Medicare Other | Admitting: Physical Therapy

## 2020-12-30 ENCOUNTER — Other Ambulatory Visit: Payer: Self-pay

## 2020-12-30 ENCOUNTER — Encounter: Payer: Self-pay | Admitting: Physical Therapy

## 2020-12-30 DIAGNOSIS — R2689 Other abnormalities of gait and mobility: Secondary | ICD-10-CM | POA: Diagnosis not present

## 2020-12-30 DIAGNOSIS — M25552 Pain in left hip: Secondary | ICD-10-CM

## 2020-12-30 DIAGNOSIS — M6281 Muscle weakness (generalized): Secondary | ICD-10-CM

## 2020-12-30 DIAGNOSIS — M5442 Lumbago with sciatica, left side: Secondary | ICD-10-CM

## 2020-12-30 NOTE — Therapy (Signed)
[place], [place], [address] Phone: [phone]   Fax:  [phone]  Physical Therapy Treatment  Patient Details  Name: Brittany [name] MRN: [id number] Date of Birth: Jan 28, 1956 Referring Provider (Brittany [name]): Eunice Blase, MD   Encounter Date: 12/30/2020   Brittany [name] End of Session - 12/30/20 1140    Visit Number 11    Number of Visits 18    Date for Brittany [name] Re-Evaluation 01/13/21    Authorization Type MCR/BCBS    Progress Note Due on Visit 18    Brittany [name] Start Time 1058    Brittany [name] Stop Time 1145   last 8 min on ice   Brittany [name] Time Calculation (min) 47 min    Activity Tolerance Patient tolerated treatment well    Behavior During Therapy [facility] for tasks assessed/performed           Past Medical History:  Diagnosis Date  . ANEMIA-NOS   . Asymptomatic varicose veins   . Bone metastases ([place]) 2021  . Breast cancer ([place]) 1996   LEFT BREAST CA  . BREAST CANCER, HX OF 01/1995 dx   s/p Lumpectomy, XRT, chemo and 24yrarmidex  . Gestational diabetes mellitus in childbirth, diet controlled 1985  . Hypothyroid 09/28/2015   Dx 09/2015  . Leukopenia    mild since chemo  . OSTEOARTHRITIS   . Personal history of chemotherapy   . Personal history of radiation therapy   . SCOLIOSIS, MILD     Past Surgical History:  Procedure Laterality Date  . BREAST BIOPSY    . BREAST LUMPECTOMY Left    1996  . left lumpectomy  1996  . TONSILLECTOMY    . TOTAL HIP ARTHROPLASTY     2002 & [facility] [facility]   There were no vitals filed for this visit.   Subjective Assessment - 12/30/20 1104    Subjective Patient relays she had a better day yesterday, she felt better and was able to walk longer. She is trying to wean off of pain meds as tolerated. She also relays she will see chiropractor today who is suppossed to work on her psoas muscle.    Pertinent History bilat THA, She does have a history of metastatic breast cancer.  She had a bone scan in September  showing stable lesions throughout the spine.    Limitations Lifting;Standing;Walking    How long can you stand comfortably? 30 minutes max    How long can you walk comfortably? 30 minutes max    Diagnostic tests "Lumbar x-rays reveal slight anterolisthesis of L5 on S1 with facet arthropathy at that level and to a milder degree in the upper levels.  The hip prosthesis looks intact with good alignment, no sign of loosening. "    Pain Onset More than a month ago            [name] Treatment/Exercise - 12/30/20 0001      Lumbar Exercises: Stretches   Piriformis Stretch Right;Left;2 reps;30 seconds    Other Lumbar Stretch Exercise standing flexion stretch 5 sec X 10      Lumbar Exercises: Aerobic   Nustep L5 X 8 min UE/LE      Lumbar Exercises: Machines for Strengthening   Leg Press 68 lbs X40 bilat, SL 2x10 37 lbs      Lumbar Exercises: Standing   Other Standing Lumbar Exercises lumbar extension x 10, lateral step up and over  X10 reps bilat with 8 inch step and UE  support. Lateral walking and monster walking with green band around knees at counter up/down X 4 trips    Other Standing Lumbar Exercises hip flexion, abduction and extension 2lb ankle weight bilat x20      Lumbar Exercises: Seated   Other Seated Lumbar Exercises hip IR/ER with red 2X15 bilat      Cryotherapy   Number Minutes Cryotherapy 8 Minutes    Cryotherapy Location Hip    Type of Cryotherapy Ice pack                    Brittany [name] Short Term Goals - 12/11/20 1118      Brittany [name] SHORT TERM GOAL #1   Title She will be independent with inital HEP     Baseline no HEP until today    Time 4    Period Weeks    Status Achieved    Target Date 11/11/20      Brittany [name] SHORT TERM GOAL #2   Status Achieved             Brittany [name] Long Term Goals - 12/23/20 1655      Brittany [name] LONG TERM GOAL #1   Title She will improve FOTO score to predicted value once has been completed we will know the value.    Baseline 40%, predicted 56%     Time 8    Period Weeks    Status On-going      Brittany [name] LONG TERM GOAL #2   Title She will report pain improved to no more than 3/10 with ususal activity.    Baseline pain has decreased but decreased tolerance for activity    Time 8    Period Weeks    Status On-going      Brittany [name] LONG TERM GOAL #3   Title She will be able to return to light golf and tennis    Baseline unable to return thus far    Time 8    Period Weeks    Status On-going      Brittany [name] LONG TERM GOAL #4   Title Lumbar ROM improved to [facility] with minimal amount of pain    Baseline ROM is increasing slowly    Time 8    Period Weeks    Status Partially Met      Brittany [name] LONG TERM GOAL #5   Status Achieved                 Plan - 12/30/20 1141    Clinical Impression Statement She is showing some improvements in overall pain and standing activity tolerance but still overall limited and not back to normal prior level of function. Brittany [name] will continue to progress as tolerated to improve her overall functional abilites and activity tolerance with graded approach.    Examination-Activity Limitations Bend;Sleep;Squat;Stairs;Stand;Lift    Examination-Participation Restrictions Cleaning;Community Activity;Shop;Other    Stability/Clinical Decision Making Stable/Uncomplicated    Rehab Potential Good    Brittany [name] Frequency 2x / week    Brittany [name] Duration 8 weeks    Brittany [name] Treatment/Interventions ADLs/Self Care Home Management;Cryotherapy;Electrical Stimulation;Iontophoresis 56m/ml Dexamethasone;Moist Heat;Traction;Ultrasound;Therapeutic activities;Therapeutic exercise;Balance training;Neuromuscular re-education;Manual techniques;Passive range of motion;Dry needling;Joint Manipulations;Spinal Manipulations;Taping    Brittany [name] Next Visit Plan make goal for sit to stand/balance, redo FOTO, increase standing tolerance    Brittany [name] Home Exercise Plan Access Code: [email or web address]   Consulted and Agree with Plan of Care Patient           Patient will benefit from skilled therapeutic  intervention in order to  improve the following deficits and impairments:  Decreased activity tolerance,Decreased balance,Decreased endurance,Decreased range of motion,Decreased strength,Increased edema,Hypomobility,Difficulty walking,Impaired flexibility,Postural dysfunction,Pain  Visit Diagnosis: Pain in left hip  Acute left-sided low back pain with left-sided sciatica  Muscle weakness (generalized)  Other abnormalities of gait and mobility     Problem List Patient Active Problem List   Diagnosis Date Noted  . Lumbar radiculopathy 10/07/2020  . 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  . Malignant neoplasm of central portion of left breast in female, estrogen receptor positive ([place]) 11/22/2019  . Bone metastases ([place]) 11/22/2019  . Anatomical narrow angle glaucoma 09/25/2018  . History of gestational diabetes 09/21/2017  . Raynaud phenomenon 09/21/2016  . 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 Left rotator cuff tear 10/07/2015  . Hypothyroid 09/28/2015  . Leukopenia 12/08/2012  . Metatarsalgia of both feet 09/21/2011  . CERVICALGIA 05/04/2010  . Asymptomatic varicose veins 03/17/2009  . Osteoarthritis 03/17/2009  . SCOLIOSIS, MILD 03/17/2009    Brittany [name], Brittany [name],Brittany [name] 12/30/2020, 11:43 AM  [facility] [address] [place], [place], [address] Phone: [phone]   Fax:  [phone]  Name: Brittany [name] MRN: [id number] Date of Birth: Mar 19, 1956

## 2020-12-31 ENCOUNTER — Encounter: Payer: Self-pay | Admitting: Family Medicine

## 2020-12-31 ENCOUNTER — Ambulatory Visit (INDEPENDENT_AMBULATORY_CARE_PROVIDER_SITE_OTHER): Payer: Medicare Other | Admitting: Family Medicine

## 2020-12-31 DIAGNOSIS — M25552 Pain in left hip: Secondary | ICD-10-CM

## 2020-12-31 NOTE — Progress Notes (Signed)
I saw and examined the patient with Dr. Elouise Munroe and agree with assessment and plan as outlined.    Improved, but still with lateral hip pain.  Will inject the greater trochanter today.

## 2020-12-31 NOTE — Progress Notes (Signed)
Office Visit Note   Patient: Brittany [name]           Date of Birth: 1956/10/21           MRN: [id number] Visit Date: 12/31/2020 Requested by: Binnie Rail, MD [place],  [place] [address] PCP: Binnie Rail, MD  Subjective: Chief Complaint  Patient presents with  . Left Hip - Follow-up, Pain    The radicular pain down the left leg has lessened post ESIs on 2/03 and 2/23 from Dr. Ernestina Patches. The hip is still hurting, inhibiting her from doing much walking. Still going to PT. Went to see a chiropractor yesterday for this. She wonders if this is a muscle/soft tissue problem or a joint problem.    HPI: 65yo F Presenting to clinic to follow up on left leg radicular symptoms. Patient previously with significant posterolateral left hip pain, radiating down into her foot. Previously given an injection near ichial tuberosity, which significantly improved this local pain, however she remains with radiating symptoms. She then had two ESIs, which resolved her radiating symptoms. Unfortunately, she remains with significant lateral left hip pain, which is so severe that she feels as though she cannot stand comfortably for any prolonged amount of time. States her pain was so bad that she recently went to a party, and had to hide in the bathroom because she couldn't stand without terrible pain. Pain is located over the lateral aspect of her hip, though she has difficulty pin-pointing it as she took tramadol recently before her appointment. She has been compliant with PT for the last three months, and says this has helped her feel much stronger, but hasn't improved her pain. Pain no longer radiates, no weakness or numbness.               ROS:   All other systems were reviewed and are negative.  Objective: Vital Signs: There were no vitals taken for this visit.  Physical Exam:  General:  Alert and oriented, in no acute distress. Pulm:  Breathing unlabored. Psy:  Normal mood, congruent  affect. Skin:  Left lateral hip with no bruising, rashes, or erythema. Overlying skin intact.   Left hip:  Antalgic gait, favoring left leg.   Full range of motion of hip with no pain.  Symmetric internal and external rotation. Strength: 5 out of 5 strength with hip flexion, abduction and adduction as well as knee flexion and extension, and ankle dorsi/plantarflexion.  Palpation: Endorses tenderness to palpation over the posterior aspect of the greater trochanteric area.  Also with tenderness over the piriformis and gluteal musculature.  Tenderness over left SI joint.    Supine exam: No pain with logroll.  No pain with internal rotation of the hip.    Imaging: No results found.  Assessment & Plan: 65yo F presenting to clinic with concerns of persistent left lateral hip pain, despite PT, ESIx2, ischial injection. Given persistent pain over GTB, will attempt CSI to this area. Risks and benefits discussed, and patient opted to proceed. CSI performed as described below, which patient tolerated well. - Aftercare and return precautions discussed.  - Patient had no further questions or concerns today.      Procedures: Greater Trochanteric Bursa Cortisone Injection:  Risks and benefits of procedure discussed, Patient opted to proceed. Verbal Consent obtained.  Timeout performed.  Area of maximal tenderness identified. Skin prepped in a sterile fashion with betadine before further cleansing with alcohol. Ethyl Chloride was used for  topical analgesia.  Left greater trochanteric bursal area was injected with 4cc 0.25%Bupivacaine without epinephrine using a spinal needle. Syringe was removed from the needle, and 6mg  betamethasone was then injected into the area.   Patient tolerated the injection well with no immediate complications. Aftercare instructions were discussed, and patient was given strict return precautions.     PMFS History: Patient Active Problem List   Diagnosis Date Noted  .  Lumbar radiculopathy 10/07/2020  . 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  . Malignant neoplasm of central portion of left breast in female, estrogen receptor positive ([place]) 11/22/2019  . Bone metastases ([place]) 11/22/2019  . Anatomical narrow angle glaucoma 09/25/2018  . History of gestational diabetes 09/21/2017  . Raynaud phenomenon 09/21/2016  . 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 Left rotator cuff tear 10/07/2015  . Hypothyroid 09/28/2015  . Leukopenia 12/08/2012  . Metatarsalgia of both feet 09/21/2011  . CERVICALGIA 05/04/2010  . Asymptomatic varicose veins 03/17/2009  . Osteoarthritis 03/17/2009  . SCOLIOSIS, MILD 03/17/2009   Past Medical History:  Diagnosis Date  . ANEMIA-NOS   . Asymptomatic varicose veins   . Bone metastases ([place]) 2021  . Breast cancer ([place]) 1996   LEFT BREAST CA  . BREAST CANCER, HX OF 01/1995 dx   s/p Lumpectomy, XRT, chemo and 41yr armidex  . Gestational diabetes mellitus in childbirth, diet controlled 1985  . Hypothyroid 09/28/2015   Dx 09/2015  . Leukopenia    mild since chemo  . OSTEOARTHRITIS   . Personal history of chemotherapy   . Personal history of radiation therapy   . SCOLIOSIS, MILD     Family History  Problem Relation Age of Onset  . Heart disease Father 12       AMI  . Hyperlipidemia Father   . Valvular heart disease Father 62  . Parkinson's disease Father 46  . Arthritis Mother   . Breast cancer Mother 65  . Hyperlipidemia Mother   . Osteoporosis Mother   . Pancreatic cancer Maternal Grandmother 73       presumed dx  . Diabetes Paternal Grandfather        type 2  . Breast cancer Maternal Aunt     Past Surgical History:  Procedure Laterality Date  . BREAST BIOPSY    . BREAST LUMPECTOMY Left    1996  . left lumpectomy  1996  . TONSILLECTOMY    . TOTAL HIP ARTHROPLASTY     2002 & 2003-Dr. vail @ [facility] History   Occupational History  . Not on file  Tobacco Use  . Smoking status:  Former Smoker    Quit date: 06/25/1977    Years since quitting: 43.5  . Smokeless tobacco: Never Used  Substance and Sexual Activity  . Alcohol use: Yes    Alcohol/week: 3.0 - 4.0 standard drinks    Types: 3 - 4 Glasses of wine per week  . Drug use: No  . Sexual activity: Not on file

## 2021-01-06 ENCOUNTER — Other Ambulatory Visit: Payer: Self-pay

## 2021-01-06 ENCOUNTER — Ambulatory Visit (INDEPENDENT_AMBULATORY_CARE_PROVIDER_SITE_OTHER): Payer: Medicare Other | Admitting: Physical Therapy

## 2021-01-06 ENCOUNTER — Encounter: Payer: Self-pay | Admitting: Physical Therapy

## 2021-01-06 ENCOUNTER — Encounter: Payer: Self-pay | Admitting: Family Medicine

## 2021-01-06 DIAGNOSIS — M5442 Lumbago with sciatica, left side: Secondary | ICD-10-CM

## 2021-01-06 DIAGNOSIS — M25552 Pain in left hip: Secondary | ICD-10-CM | POA: Diagnosis not present

## 2021-01-06 DIAGNOSIS — M6281 Muscle weakness (generalized): Secondary | ICD-10-CM

## 2021-01-06 MED ORDER — TRAMADOL HCL 50 MG PO TABS
50.0000 mg | ORAL_TABLET | Freq: Four times a day (QID) | ORAL | 0 refills | Status: DC | PRN
Start: 2021-01-06 — End: 2021-01-15

## 2021-01-06 NOTE — Therapy (Addendum)
[facility] [address] [place], [place], [address] Phone: [phone]   Fax:  [phone]  Physical Therapy Treatment/Discharge  Patient Details  Name: Brittany [name] MRN: [id number] Date of Birth: [birth year] Referring Provider (PT): Eunice Blase, MD   Encounter Date: 01/06/2021   PT End of Session - 01/06/21 1303     Visit Number 12    Number of Visits 18    Date for PT Re-Evaluation 01/13/21    Authorization Type MCR/BCBS    Progress Note Due on Visit 18    PT Start Time 1100    PT Stop Time 1150    PT Time Calculation (min) 50 min    Activity Tolerance Patient tolerated treatment well    Behavior During Therapy [facility] for tasks assessed/performed             Past Medical History:  Diagnosis Date   ANEMIA-NOS    Asymptomatic varicose veins    Bone metastases ([place]) 2021   Breast cancer ([place]) 1996   LEFT BREAST CA   BREAST CANCER, HX OF 01/1995 dx   s/p Lumpectomy, XRT, chemo and 110yrarmidex   Gestational diabetes mellitus in childbirth, diet controlled 1985   Hypothyroid 09/28/2015   Dx 09/2015   Leukopenia    mild since chemo   OSTEOARTHRITIS    Personal history of chemotherapy    Personal history of radiation therapy    SCOLIOSIS, MILD     Past Surgical History:  Procedure Laterality Date   BREAST BIOPSY     BREAST LUMPECTOMY Left    1996   left lumpectomy  [facility]    2002 & [facility]   There were no vitals filed for this visit.   Subjective Assessment - 01/06/21 1125     Subjective Patient relays she is slowly improving with pain and standing activity tolerance.    Pertinent History bilat THA, She does have a history of metastatic breast cancer.  She had a bone scan in September showing stable lesions throughout the spine.    Limitations Lifting;Standing;Walking    How long can you stand comfortably? 30 minutes max    How long can you walk comfortably?  30 minutes max    Diagnostic tests "Lumbar x-rays reveal slight anterolisthesis of L5 on S1 with facet arthropathy at that level and to a milder degree in the upper levels.  The hip prosthesis looks intact with good alignment, no sign of loosening. "    Pain Onset More than a month ago                [facility] - 01/06/21 0001       Assessment   Medical Diagnosis M54.42 (ICD-10-CM) - Acute left-sided low back pain with left-sided sciatic    Referring Provider (PT) Hilts, Michael, MD      AROM   Lumbar Flexion 100%    Lumbar Extension 50% - limited and feels a pull but no increased pain                [facility] Adult PT Treatment/Exercise - 01/06/21 0001       Lumbar Exercises: Stretches   Piriformis Stretch Left;2 reps;30 seconds    Figure 4 Stretch 2 reps;30 seconds    Other Lumbar Stretch Exercise standing flexion stretch 5 sec X 10      Lumbar Exercises: Aerobic   Nustep L5 X  8 min UE/LE      Lumbar Exercises: Machines for Strengthening   Leg Press 75 lbs 2X15, then SL 2X10 43 lbs      Lumbar Exercises: Standing   Other Standing Lumbar Exercises lateral walking 4 round trips, monster walks 3 round trips at counter top with green band      Lumbar Exercises: Seated   Other Seated Lumbar Exercises bilat hip IR with green 2X10 reps      Lumbar Exercises: Supine   Bridge with clamshell 10 reps    Bridge with Ball Squeeze Limitations green      Lumbar Exercises: Sidelying   Clam Left    Clam Limitations 3 sets of 10 with green      Manual Therapy   Manual therapy comments percussive gun STM to Lt glutes                      PT Short Term Goals - 12/11/20 1118       PT SHORT TERM GOAL #1   Title She will be independent with inital HEP     Baseline no HEP until today    Time 4    Period Weeks    Status Achieved    Target Date 11/11/20      PT SHORT TERM GOAL #2   Status Achieved               PT Long Term Goals - 01/06/21  1314       PT LONG TERM GOAL #1   Title She will improve FOTO score to predicted value once has been completed we will know the value.    Baseline 40%, predicted 56%    Time 8    Period Weeks    Status On-going      PT LONG TERM GOAL #2   Title She will report pain improved to no more than 3/10 with ususal activity.    Baseline pain has decreased but decreased tolerance for activity    Time 8    Period Weeks    Status On-going      PT LONG TERM GOAL #3   Title She will be able to return to light golf and tennis    Baseline unable to return thus far    Time 8    Period Weeks    Status On-going      PT LONG TERM GOAL #4   Title Lumbar ROM improved to [facility] with minimal amount of pain    Baseline ROM is increasing slowly    Time 8    Period Weeks    Status Achieved      PT LONG TERM GOAL #5   Title -    Status Achieved                   Plan - 01/06/21 1311     Clinical Impression Statement After todays session she feels she wants to transition from regular consistent PT and trial her HEP and then she will return to PT in the event she feels she needs to. Overall she has progressed lately and has less overall pain. She does still have strength deficits in her Lt glutes and hip but this should continue to improve with HEP. She had no further questions or concerns and we will place her PT on hold, if we do not hear from her within 30 days we will DC at that time.  Examination-Activity Limitations Bend;Sleep;Squat;Stairs;Stand;Lift    Examination-Participation Restrictions Cleaning;Community Activity;Shop;Other    Stability/Clinical Decision Making Stable/Uncomplicated    Rehab Potential Good    PT Frequency 2x / week    PT Duration 8 weeks    PT Treatment/Interventions ADLs/Self Care Home Management;Cryotherapy;Electrical Stimulation;Iontophoresis 48m/ml Dexamethasone;Moist Heat;Traction;Ultrasound;Therapeutic activities;Therapeutic exercise;Balance  training;Neuromuscular re-education;Manual techniques;Passive range of motion;Dry needling;Joint Manipulations;Spinal Manipulations;Taping    PT Next Visit Plan hold PT for now    PT Home Exercise Plan Access Code: [email or web address]   Consulted and Agree with Plan of Care Patient             Patient will benefit from skilled therapeutic intervention in order to improve the following deficits and impairments:  Decreased activity tolerance,Decreased balance,Decreased endurance,Decreased range of motion,Decreased strength,Increased edema,Hypomobility,Difficulty walking,Impaired flexibility,Postural dysfunction,Pain  Visit Diagnosis: Pain in left hip  Acute left-sided low back pain with left-sided sciatica  Muscle weakness (generalized)     Problem List Patient Active Problem List   Diagnosis Date Noted   Lumbar radiculopathy 10/07/2020   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   Malignant neoplasm of central portion of left breast in female, estrogen receptor positive ([place] 11/22/2019   [place] 11/22/2019   Anatomical narrow angle glaucoma 09/25/2018   History of gestational diabetes 09/21/2017   Raynaud phenomenon 09/21/2016   History of total replacement of both hip joints 01/12/2016   Left rotator cuff tear 10/07/2015   Hypothyroid 09/28/2015   Leukopenia 12/08/2012   Metatarsalgia of both feet 09/21/2011   CERVICALGIA 05/04/2010   Asymptomatic varicose veins 03/17/2009   Osteoarthritis 03/17/2009   SCOLIOSIS, MILD 03/17/2009    BDebbe Odea,PT,DPT 01/06/2021, 1:23 PM  PHYSICAL THERAPY DISCHARGE SUMMARY  Visits from Start of Care: 12  Current functional level related to goals / functional outcomes: See note   Remaining deficits: See note   Education / Equipment: HEP   Patient agrees to discharge. Patient goals were partially met. Patient is being discharged due to not returning since the last visit.  MScot Jun PT, DPT, OCS, ATC 04/09/21  1:44  PM     [facility] [address] [address]Phone: [phone]  Fax:  [phone] Name: Brittany KELLOGGMRN: [id number] of Birth: 6Nov 09, 1957

## 2021-01-13 ENCOUNTER — Other Ambulatory Visit: Payer: Self-pay

## 2021-01-13 ENCOUNTER — Encounter (HOSPITAL_COMMUNITY)
Admission: RE | Admit: 2021-01-13 | Discharge: 2021-01-13 | Disposition: A | Discharge status: Home / Self Care | Payer: Medicare Other | Source: Ambulatory Visit | Attending: Oncology | Admitting: Oncology

## 2021-01-13 DIAGNOSIS — C7951 Secondary malignant neoplasm of bone: Secondary | ICD-10-CM | POA: Diagnosis present

## 2021-01-13 DIAGNOSIS — Z170 Estrogen receptor positive status [ER+]: Secondary | ICD-10-CM | POA: Diagnosis present

## 2021-01-13 DIAGNOSIS — C50112 Malignant neoplasm of central portion of left female breast: Secondary | ICD-10-CM | POA: Diagnosis not present

## 2021-01-13 MED ORDER — TECHNETIUM TC 99M MEDRONATE IV KIT
20.0000 | PACK | Freq: Once | INTRAVENOUS | Status: AC | PRN
  Administered 2021-01-13: 20.4 via INTRAVENOUS

## 2021-01-14 ENCOUNTER — Other Ambulatory Visit: Payer: Medicare Other

## 2021-01-14 ENCOUNTER — Ambulatory Visit: Payer: Medicare Other

## 2021-01-14 NOTE — Progress Notes (Signed)
[place]  [phone] [phone]     ID: Brittany [name] DOB: 1955-12-22  MR#: [id number]  SWF#:[id number]  Patient Care Team: Binnie Rail, MD as PCP - General (Internal Medicine) Aloha Gell, MD as Consulting Physician (Obstetrics and Gynecology) Sydnee Levans, MD (Dermatology) Kaleigha Chamberlin, Virgie Dad, MD as Consulting Physician (Oncology) Richmond Campbell, MD as Consulting Physician (Gastroenterology) Stefanie Libel, MD as Consulting Physician (Sports Medicine) Eunice Blase, MD (Family Medicine) Erline Levine, MD as Consulting Physician (Neurosurgery) Chauncey Cruel, MD OTHER MD:   CHIEF COMPLAINT: Stage IV lobular breast cancer  CURRENT TREATMENT: Delton See; capecitabine   INTERVAL HISTORY: [name] returns today for follow up of her stage IV lobular breast cancer.  She is accompanied by her husband.  Since her last visit, she underwent bone scan on 01/13/2021 showing: mixed response to therapy; slightly improved metastatic disease burden within thoracolumbar spine and ribs; interval progression of disease involving sternum, sacrum, and iliac bones bilaterally.  We discussed this at length today and went over how difficult it is to document progression when there is bone only disease especially lobular. I am not reading the scan as definitive evidence of progression  She was restarted on capecitabine 1000 mg in the morning only, on 03/25/2020.  She is developing more palmar plantar erythrodysesthesia but she feels "I can take it".  She began [place] on 12/13/2019.  She is tolerating this with no side effects that she is aware of.  We are following her tumor markers, which continue to improve Lab Results  Component Value Date   CA2729 31.6 12/18/2020   CA2729 38.6 11/19/2020   CA2729 37.4 10/22/2020   CA2729 38.4 09/24/2020   CA2729 40.5 (H) 08/27/2020   Lab Results  Component Value Date   CEA1 5.06 (H) 01/15/2021   CEA1 5.21 (H)  12/18/2020   CEA1 5.39 (H) 11/19/2020   CEA1 6.17 (H) 10/22/2020   CEA1 5.74 (H) 09/24/2020    REVIEW OF SYSTEMS: [name] has significant back and leg pain which limit her. She and her husband  have many family locations in [place] and out [place] that she is planning to travel to.  They do not have a trip to [place] right now but they do have friends in [place] they are planning to visit sometime this year or early next year.  She tells me she went to a chiropractor this morning and her pain feels better today.  She is also going to see an acupuncturist.  They tell me they met with Dr. Vertell Limber and he told them surgery was out.  A detailed review of systems today was otherwise stable   COVID 19 VACCINATION STATUS: Status post Pfizer x2 with booster August 2021   HISTORY OF CURRENT ILLNESS: From the original intake note:  "Brittany" [name] has a history of left breast cancer dating back to 38.  She was followed by Dr. Beryle Beams and underwent lumpectomy, adjuvant chemotherapy, radiation and "hormonal therapy".  Per Brittany's recollection the tumor was less than 2 cm, 2 out of 10 lymph nodes were involved, and it was a lobular breast cancer.  She took tamoxifen for many years and then raloxifene.  Dr. Beryle Beams also evaluated her for leukopenia which was noted at first in 2011, worked up with a negative ANA and negative rheumatoid factors.  He felt it was a benign residual from her earlier chemotherapy.  She was released from follow-up in 2015.  She has continued on intensified screening because of her family  history.  On November 23, 2017 she had bilateral breast MRIs which were unremarkable.  Mammography November 20, 2018 showed no findings suspicious of malignancy.  Breast MRI 10/30/2019 showed a new irregular 0.4 cm enhancing focus in the upper inner quadrant of the right breast.  The left breast continued to be unremarkable except for postoperative changes and there were no abnormal appearing lymph  nodes.  However in that same MRI multiple areas of enhancement were noted in the sternum.  This had not been seen in the prior MRI.  Plan right MRI biopsy 11/16/2019 was canceled as the previously demonstrated 0.4 cm focus in the right breast showed only a tortuous vein at that location.  However bone scan 11/19/2019 obtained to follow-up on the sternal findings showed additional areas of concern at T12, T7-T11, L4 and ribs, as well as the sternum.  The patient's subsequent history is as detailed below.   PAST MEDICAL HISTORY: Past Medical History:  Diagnosis Date  . ANEMIA-NOS   . Asymptomatic varicose veins   . Bone metastases ([place]) 2021  . Breast cancer ([place]) 1996   LEFT BREAST CA  . BREAST CANCER, HX OF 01/1995 dx   s/p Lumpectomy, XRT, chemo and 60yrarmidex  . Gestational diabetes mellitus in childbirth, diet controlled 1985  . Hypothyroid 09/28/2015   Dx 09/2015  . Leukopenia    mild since chemo  . OSTEOARTHRITIS   . Personal history of chemotherapy   . Personal history of radiation therapy   . SCOLIOSIS, MILD     PAST SURGICAL HISTORY: Past Surgical History:  Procedure Laterality Date  . BREAST BIOPSY    . BREAST LUMPECTOMY Left    1996  . left lumpectomy  1996  . TONSILLECTOMY    . TOTAL HIP ARTHROPLASTY     2002 & 2003-Dr. vail @ [facility]    FAMILY HISTORY Family History  Problem Relation Age of Onset  . Heart disease Father 535      AMI  . Hyperlipidemia Father   . Valvular heart disease Father 736 . Parkinson's disease Father 732 . Arthritis Mother   . Breast cancer Mother 530 . Hyperlipidemia Mother   . Osteoporosis Mother   . Pancreatic cancer Maternal Grandmother 73       presumed dx  . Diabetes Paternal Grandfather        type 2  . Breast cancer Maternal Aunt   The patient's father died at the age of 856from heart disease.  He had Lewy body dementia.  The patient's mother is 853years old as of January 2021.  She is in a memory unit.  She has a  history of breast cancer diagnosed in her 52s(ductal carcinoma in situ).  The patient's mother sister had breast cancer diagnosed around age 67  The patient's mother's mother also had cancer, "internal" (possibly ovarian or gastrointestinal).  There is no cancer on the father's side of the family.   GYNECOLOGIC HISTORY:  No LMP recorded. Patient is postmenopausal. Menarche: 65years old Age at first live birth: 65years old GArcherP 2 LMP with chemotherapy, in 1996 Contraceptive a little over a year, with no complications HRT no  Hysterectomy?  No Salpingo-oophorectomy?  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  She and her husband JClair Gullingmet while working for [employer]  [place] probably were exposed to some radiation at that time.  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  They both finally retired 03/28/2020.  Their daughter Anderson Malta is a Pharmacist, hospital in [place].  Their son Jenny Reichmann works for the same [employer] that the patient worked for.  Note that Jenny Reichmann did the [employer].  The patient has 1 grandchild (in [place]).  The patient attends [employer]: The patient's husband is her healthcare power of attorney   HEALTH MAINTENANCE: Social History   Tobacco Use  . Smoking status: Former Smoker    Quit date: 06/25/1977    Years since quitting: 43.5  . Smokeless tobacco: Never Used  Substance Use Topics  . Alcohol use: Yes    Alcohol/week: 3.0 - 4.0 standard drinks    Types: 3 - 4 Glasses of wine per week  . Drug use: No     Colonoscopy: 2015  PAP: Up-to-date  Bone density: 11/20/2018, normal   No Known Allergies  Current Outpatient Medications  Medication Sig Dispense Refill  . acetaminophen (TYLENOL) 500 MG tablet Take 1 tablet (500 mg  total) by mouth in the morning, at noon, and at bedtime. Take with aleve 220 mg 90 tablet 0  . naproxen sodium (ALEVE) 220 MG tablet Take 1 tablet (220 mg total) by mouth in the morning, at noon, and at bedtime. Take with tylenol 500 mg 90 tablet 4  . baclofen (LIORESAL) 20 MG tablet Take 1 tablet (20 mg total) by mouth 3 (three) times daily as needed for muscle spasms. 90 tablet 3  . Calcium Citrate 250 MG TABS Take 4 tablets (1,000 mg total) by mouth daily.  0  . capecitabine (XELODA) 500 MG tablet Take 2 tablets (1,000 mg total) by mouth every morning. 120 tablet 6  . Cholecalciferol (VITAMIN D3) 1000 UNITS CAPS Take by mouth daily.    . Denosumab (XGEVA Louise) Inject into the skin.    Marland Kitchen diclofenac sodium (VOLTAREN) 1 % GEL Apply 2 g topically 3 (three) times daily as needed. 100 g 1  . gabapentin (NEURONTIN) 300 MG capsule Take 1 capsule (300 mg total) by mouth 3 (three) times daily. 180 capsule 1  . levothyroxine (SYNTHROID) 50 MCG tablet Take 1 tablet (50 mcg total) by mouth daily. 90 tablet 3  . Multiple Vitamin (MULTIVITAMIN) tablet Take 1 tablet by mouth daily.    . Omega-3 Fatty Acids (FISH OIL) 1000 MG CPDR Take by mouth daily.    . traMADol (ULTRAM) 50 MG tablet Take 1-2 tablets (50-100 mg total) by mouth every 6 (six) hours as needed. 90 tablet 0   No current facility-administered medications for this visit.    OBJECTIVE:  white woman who appears stated age  37:   01/15/21 0850  BP: 121/75  Pulse: 80  Resp: 18  Temp: (!) 97.3 F (36.3 C)  SpO2: 100%     Body mass index is 21.82 kg/m.   Wt Readings from Last 3 Encounters:  01/15/21 139 lb 4.8 oz (63.2 kg)  12/18/20 143 lb 14.4 oz (65.3 kg)  11/19/20 140 lb (63.5 kg)      ECOG FS:1 - Symptomatic but completely ambulatory  Sclerae unicteric, EOMs intact Wearing a mask No cervical or supraclavicular adenopathy Lungs no rales or rhonchi Heart regular rate and rhythm Abd soft, nontender, positive bowel sounds MSK no  focal spinal tenderness, no upper extremity lymphedema Neuro: nonfocal, well oriented, appropriate affect Breasts: The right breast is benign.  The left breast is status post lumpectomy.  There is no evidence of recurrent local disease.  Both axillae are benign Skin: No significant erythema but some slits in the skin of the second digit of the right hand and fourth digit of the left hand    LAB RESULTS:  CMP     Component Value Date/Time   NA 132 (L) 01/15/2021 0838   NA 139 12/10/2013 0841   K 4.6 01/15/2021 0838   K 4.5 12/10/2013 0841   CL 97 (L) 01/15/2021 0838   CL 102 12/01/2012 0955   CO2 25 01/15/2021 0838   CO2 28 12/10/2013 0841   GLUCOSE 102 (H) 01/15/2021 0838   GLUCOSE 81 12/10/2013 0841   GLUCOSE 88 12/01/2012 0955   BUN 9 01/15/2021 0838   BUN 14 08/04/2015 0000   BUN 17.5 12/10/2013 0841   CREATININE 0.85 01/15/2021 0838   CREATININE 0.9 12/10/2013 0841   CALCIUM 9.1 01/15/2021 0838   CALCIUM 9.6 12/10/2013 0841   PROT 7.3 01/15/2021 0838   PROT 6.7 12/10/2013 0841   ALBUMIN 3.7 01/15/2021 0838   ALBUMIN 3.9 12/10/2013 0841   AST 33 01/15/2021 0838   AST 22 12/10/2013 0841   ALT 26 01/15/2021 0838   ALT 27 12/10/2013 0841   ALKPHOS 71 01/15/2021 0838   ALKPHOS 54 12/10/2013 0841   BILITOT 0.3 01/15/2021 0838   BILITOT 0.41 12/10/2013 0841   GFRNONAA >60 01/15/2021 0838   GFRAA >60 07/02/2020 1324    No results found for: Ronnald Ramp, A1GS, A2GS, BETS, BETA2SER, GAMS, MSPIKE, SPEI  No results found for: Nils Pyle, [facility]  Lab Results  Component Value Date   WBC 4.2 01/15/2021   NEUTROABS 2.2 01/15/2021   HGB 11.3 (L) 01/15/2021   HCT 33.1 (L) 01/15/2021   MCV 104.1 (H) 01/15/2021   PLT 288 01/15/2021      Chemistry      Component Value Date/Time   NA 132 (L) 01/15/2021 0838   NA 139 12/10/2013 0841   K 4.6 01/15/2021 0838   K 4.5 12/10/2013 0841   CL 97 (L) 01/15/2021 0838   CL 102 12/01/2012 0955    CO2 25 01/15/2021 0838   CO2 28 12/10/2013 0841   BUN 9 01/15/2021 0838   BUN 14 08/04/2015 0000   BUN 17.5 12/10/2013 0841   CREATININE 0.85 01/15/2021 0838   CREATININE 0.9 12/10/2013 0841   GLU 102 08/04/2015 0000      Component Value Date/Time   CALCIUM 9.1 01/15/2021 0838   CALCIUM 9.6 12/10/2013 0841   ALKPHOS 71 01/15/2021 0838   ALKPHOS 54 12/10/2013 0841   AST 33 01/15/2021 0838   AST 22 12/10/2013 0841   ALT 26 01/15/2021 0838   ALT 27 12/10/2013 0841   BILITOT 0.3 01/15/2021 0838   BILITOT 0.41 12/10/2013 0841      No results found for: LABCA2  No components found for: [id number]  No results for input(s): INR in the last 168 hours.  No results found for: LABCA2  No results found for: CAN199  Lab Results  Component Value Date   CAN125 21.1 11/23/2019    No results found for: WUJ811  Lab Results  Component Value Date   CA2729 31.6 12/18/2020    No components found for: HGQUANT  Lab Results  Component Value Date   CEA1 5.06 (H) 01/15/2021   /  CEA ([facility])  Date Value Ref Range Status  01/15/2021 5.06 (H) 0.00 - 5.00 ng/mL Final    Comment:    (NOTE) This test was performed using Architect's Chemiluminescent Microparticle Immunoassay. Values obtained from different assay methods cannot be used interchangeably. Please note that 5-10% of patients who smoke may see CEA levels up to 6.9 ng/mL. Performed at [facility], [place] [address] [place], [place] [address]      No results found for: AFPTUMOR  No results found for: [place]   No results found for: HGBA, HGBA2QUANT, HGBFQUANT, HGBSQUAN (Hemoglobinopathy evaluation)   Lab Results  Component Value Date   LDH 193 12/10/2013    No results found for: IRON, TIBC, IRONPCTSAT (Iron and TIBC)  No results found for: FERRITIN  Urinalysis    Component Value Date/Time   COLORURINE YELLOW 09/17/2014 [place] 09/17/2014 0824   LABSPEC  1.015 09/17/2014 0824   PHURINE 7.5 09/17/2014 0824   GLUCOSEU NEGATIVE 09/17/2014 0824   HGBUR NEGATIVE 09/17/2014 0824   BILIRUBINUR NEGATIVE 09/17/2014 0824   KETONESUR NEGATIVE 09/17/2014 0824   UROBILINOGEN 0.2 09/17/2014 0824   NITRITE NEGATIVE 09/17/2014 0824   LEUKOCYTESUR NEGATIVE 09/17/2014 0824    STUDIES: Epidural Steroid injection  Result Date: 12/17/2020 Magnus Sinning, MD     12/17/2020  1:35 PM Lumbosacral Transforaminal Epidural Steroid Injection - Sub-Pedicular Approach with Fluoroscopic Guidance Patient: Brittany [name]     Date of Birth: [birth year] MRN: [id number] PCP: Binnie Rail, MD     Visit Date: 12/17/2020  Universal Protocol:   Date/Time: 12/17/2020 Consent Given By: the patient Position: PRONE Additional Comments: Vital signs were monitored before and after the procedure. Patient was prepped and draped in the usual sterile fashion. The correct patient, procedure, and site was verified. Injection Procedure Details: Procedure diagnoses: Lumbar radiculopathy [M54.16]  Meds Administered: Meds ordered this encounter Medications . methylPREDNISolone acetate (DEPO-MEDROL) injection 80 mg Laterality: Left Location/Site: L5-S1 Needle:5.0 in., 22 ga.  Short bevel or Quincke spinal needle Needle Placement: Transforaminal Findings:   -Comments: Excellent flow of contrast along the nerve, nerve root and into the epidural space. Procedure Details: After squaring off the end-plates to get a true AP view, the C-arm was positioned so that an oblique view of the foramen as noted above was visualized. The target area is just inferior to the "nose of the scotty dog" or sub pedicular. The soft tissues overlying this structure were infiltrated with 2-3 ml. of 1% Lidocaine without Epinephrine. The spinal needle was inserted toward the target using a "trajectory" view along the fluoroscope beam.  Under AP and lateral visualization, the needle was advanced so it did not puncture dura and was  located close the 6 O'Clock position of the pedical in AP tracterory. Biplanar projections were used to confirm position. Aspiration was confirmed to be negative for CSF and/or blood. A 1-2 ml. volume of Isovue-250 was injected and flow of contrast was noted at each level. Radiographs were obtained for documentation purposes. After attaining the desired flow of contrast documented above, a 0.5 to 1.0 ml test dose of 0.25% Marcaine was injected into each respective transforaminal space.  The patient was observed for 90 seconds post injection.  After no sensory deficits were reported, and normal lower extremity motor function was noted,   the above injectate was administered so that equal amounts of the injectate were placed at each foramen (level) into the transforaminal epidural space. Additional Comments: The patient  tolerated the procedure well Dressing: 2 x 2 sterile gauze and Band-Aid  Post-procedure details: Patient was observed during the procedure. Post-procedure instructions were reviewed. Patient left the clinic in stable condition.   NM Bone Scan Whole Body  Result Date: 01/14/2021 CLINICAL DATA:  Breast cancer, known osseous metastases, assess treatment response EXAM: NUCLEAR MEDICINE WHOLE BODY BONE SCAN TECHNIQUE: Whole body anterior and posterior images were obtained approximately 3 hours after intravenous injection of radiopharmaceutical. RADIOPHARMACEUTICALS:  20.4 mCi Technetium-73mMDP IV COMPARISON:  07/22/2020 FINDINGS: Abnormal uptake of radiotracer is again identified throughout the thoracolumbar spine, most prominently at T8, T11, and T12, similar to that noted on prior examination. Additional foci of uptake scattered throughout the ribs, however, appears stable to slightly improved since prior examination. There is increasing uptake within the sternum, sacrum, and iliac bones bilaterally, however. Similar uptake within the shoulders bilaterally, likely degenerative in nature. Defects  related to bilateral hip prostheses again identified. Defect involving the left anterior ribs again identified possibly the sequela of prior radiation therapy in this region. Normal soft tissue distribution. Normal uptake and excretion within the kidneys and bladder. IMPRESSION: Mixed response to therapy. While metastatic disease burden within the thoracolumbar spine and ribs appears stable to slightly improved, there has been interval progression of disease involving the sternum, sacrum, and iliac bones bilaterally. Electronically Signed   By: AFidela SalisburyMD   On: 01/14/2021 08:57   XR C-ARM NO REPORT  Result Date: 12/17/2020 Please see Notes tab for imaging impression.    ELIGIBLE FOR AVAILABLE RESEARCH PROTOCOL: no  ASSESSMENT: 65y.o. [place] woman status post left lumpectomy 1996 for a T1 N1, anatomic stage II invasive lobular breast cancer, estrogen receptor positive  (a) s/p adjuvant chemotherapy with doxorubicin and cyclophosphamide x4  (b) status post adjuvant radiation  (c) status post tamoxifen for 5+ years  (1) benign leukopenia: noted 2011, felt secondary to prior chemotherapy  (2) genetics testing 06/23/2018 through [facility] Multi[facility]cer panel found no deleterious mutations in the 84 genes tested including BRCA 1-2  (a) 2 variants of uncertain significance were fund in [place][c.1037C>T (p.Ser346Phe) and c.503C>G (p.ALA168Gly)  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, 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 (3) staging studies:  (a) breast MRI 10/30/2019 shows sternal mottling  (b) bone scan 11/19/2019 shows multiple areas of uptake suspicious for metastatic disease  (c) CT of the chest abdomen and pelvis 11/28/2019 shows no visceral disease  (d) tumor markers 11/23/2019 shows a CEA of 18.11, CA 27-29 of 61.2, CA 125 of 21.1  (e) F-18-estradiol PET (cerianna) 01/11/2020 shows multiple sclerotic bone lesions which do not accumulate the estrogen receptor specific radiotracer  (f) bone marrow  biopsy 01/25/2020 confirms metastatic breast cancer, gross cystic disease fluid protein positive, but estrogen and progesterone receptor negative.  HER-2/neu was 2+ on immunohistochemistry negative by FISH (1.35/1.70).  (g) CARIS confirms triple negative disease, finds a positive androgen receptor at 3+, 70%; and the PIK3CA was indeterminate.  PTEN was positive by IHC at 90%.  PD-L1 was negative.  Genomic LOH was low and MSI was stable with proficient mismatch mismatch repair.  BRCA 1 and 2 were negative.  (4) denosumab/Xgeva started 12/13/2019, repeated monthly  (5) metronomic capecitabine started 02/25/2020, with significant plantar dysesthesia developing after 1 week, drug held 03/12/2020  (a) capecitabine restarted 03/25/2020 at 1000 mg in the morning only.  (b) bone scan 07/23/2020 shows no evidence of disease progression   (c) MRI of the total spine and pelvis 11/01/2020 shows multiple bone lesions  but no epidural encroachment, no compression fracture and no evidence for lymphangitic spread of disease; there is significant degenerative disease  (d) other options include CMF, sacituzumab govitecan, bicalutamide   PLAN: [name] is now a little over 1 year out from definitive diagnosis of metastatic disease.  She continues on very low-dose metronomic capecitabine.  She is having significant palmar plantar erythrodysesthesia and today we discussed switching to CMF or a different treatment.  At this point she would like to continue what she is doing at least for another month and reassess.  We discussed the fact that her bone scan being read as "mixed response" does not really reflect measurable disease.  Her tumor markers which are not perfect nevertheless continue to drop and the pain she is experiencing is largely not due to the tumor but to her significant arthritis problems so at this point we are continuing with treatment as just noted.  When we decide to make a change we will have to obtain a  repeat PET scan as a new baseline  They are disappointed that they will not be able to undergo surgery for the back problems and will seek a second opinion at [facility].  In the meantime she will try acupuncture and she is also seeing a Restaurant manager, fast food.  Today we discussed pain management and she will take Aleve and Tylenol together 3 times a day and then use the tramadol up to 4 times a day as needed in addition.  We discussed bowel prophylaxis issues.  We discussed that the goal of pain management is normal function not no pain.  She will see me again in 4 weeks.  She knows to call for any other issue that may develop before then  Total encounter time 45 minutes.Sarajane Jews C. Theola Cuellar, MD 01/15/21 8:56 PM Medical Oncology and Hematology [facility] [place], [place] [address] Tel. [phone]    Fax. [phone]   I, Wilburn Mylar, am acting as scribe for Dr. Virgie Dad. Estil Vallee.  I, Lurline Del MD, have reviewed the above documentation for accuracy and completeness, and I agree with the above.   *Total Encounter Time as defined by the Centers for Medicare and Medicaid Services includes, in addition to the face-to-face time of a patient visit (documented in the note above) non-face-to-face time: obtaining and reviewing outside history, ordering and reviewing medications, tests or procedures, care coordination (communications with other health care professionals or caregivers) and documentation in the medical record.

## 2021-01-15 ENCOUNTER — Inpatient Hospital Stay: Payer: Medicare Other

## 2021-01-15 ENCOUNTER — Telehealth: Payer: Self-pay | Admitting: Oncology

## 2021-01-15 ENCOUNTER — Inpatient Hospital Stay (HOSPITAL_BASED_OUTPATIENT_CLINIC_OR_DEPARTMENT_OTHER): Payer: Medicare Other | Admitting: Oncology

## 2021-01-15 ENCOUNTER — Encounter: Payer: Self-pay | Admitting: Oncology

## 2021-01-15 ENCOUNTER — Inpatient Hospital Stay: Payer: Medicare Other | Attending: Oncology

## 2021-01-15 ENCOUNTER — Other Ambulatory Visit: Payer: Self-pay

## 2021-01-15 VITALS — BP 121/75 | HR 80 | Temp 97.3°F | Resp 18 | Ht 67.0 in | Wt 139.3 lb

## 2021-01-15 DIAGNOSIS — C50112 Malignant neoplasm of central portion of left female breast: Secondary | ICD-10-CM | POA: Insufficient documentation

## 2021-01-15 DIAGNOSIS — C7951 Secondary malignant neoplasm of bone: Secondary | ICD-10-CM

## 2021-01-15 DIAGNOSIS — Z96643 Presence of artificial hip joint, bilateral: Secondary | ICD-10-CM

## 2021-01-15 DIAGNOSIS — I7300 Raynaud's syndrome without gangrene: Secondary | ICD-10-CM

## 2021-01-15 DIAGNOSIS — M160 Bilateral primary osteoarthritis of hip: Secondary | ICD-10-CM

## 2021-01-15 DIAGNOSIS — Z170 Estrogen receptor positive status [ER+]: Secondary | ICD-10-CM

## 2021-01-15 DIAGNOSIS — H40039 Anatomical narrow angle, unspecified eye: Secondary | ICD-10-CM

## 2021-01-15 DIAGNOSIS — Q6589 Other specified congenital deformities of hip: Secondary | ICD-10-CM

## 2021-01-15 LAB — COMPREHENSIVE METABOLIC PANEL
ALT: 26 U/L (ref 0–44)
AST: 33 U/L (ref 15–41)
Albumin: 3.7 g/dL (ref 3.5–5.0)
Alkaline Phosphatase: 71 U/L (ref 38–126)
Anion gap: 10 (ref 5–15)
BUN: 9 mg/dL (ref 8–23)
CO2: 25 mmol/L (ref 22–32)
Calcium: 9.1 mg/dL (ref 8.9–10.3)
Chloride: 97 mmol/L — ABNORMAL LOW (ref 98–111)
Creatinine, Ser: 0.85 mg/dL (ref 0.44–1.00)
GFR, Estimated: >60 mL/min (ref >60)
Glucose, Bld: 102 mg/dL — ABNORMAL HIGH (ref 70–99)
Potassium: 4.6 mmol/L (ref 3.5–5.1)
Sodium: 132 mmol/L — ABNORMAL LOW (ref 135–145)
Total Bilirubin: 0.3 mg/dL (ref 0.3–1.2)
Total Protein: 7.3 g/dL (ref 6.5–8.1)

## 2021-01-15 LAB — CBC WITH DIFFERENTIAL/PLATELET
Abs Immature Granulocytes: 0.02 10*3/uL (ref 0.00–0.07)
Basophils Absolute: 0 10*3/uL (ref 0.0–0.1)
Basophils Relative: 1 %
Eosinophils Absolute: 0.2 10*3/uL (ref 0.0–0.5)
Eosinophils Relative: 5 %
HCT: 33.1 % — ABNORMAL LOW (ref 36.0–46.0)
Hemoglobin: 11.3 g/dL — ABNORMAL LOW (ref 12.0–15.0)
Immature Granulocytes: 1 %
Lymphocytes Relative: 34 %
Lymphs Abs: 1.4 10*3/uL (ref 0.7–4.0)
MCH: 35.5 pg — ABNORMAL HIGH (ref 26.0–34.0)
MCHC: 34.1 g/dL (ref 30.0–36.0)
MCV: 104.1 fL — ABNORMAL HIGH (ref 80.0–100.0)
Monocytes Absolute: 0.4 10*3/uL (ref 0.1–1.0)
Monocytes Relative: 9 %
Neutro Abs: 2.2 10*3/uL (ref 1.7–7.7)
Neutrophils Relative %: 50 %
Platelets: 288 10*3/uL (ref 150–400)
RBC: 3.18 MIL/uL — ABNORMAL LOW (ref 3.87–5.11)
RDW: 13.7 % (ref 11.5–15.5)
WBC: 4.2 10*3/uL (ref 4.0–10.5)
nRBC: 0 % (ref 0.0–0.2)

## 2021-01-15 LAB — CEA (IN HOUSE-CHCC): CEA (CHCC-In House): 5.06 ng/mL — ABNORMAL HIGH (ref 0.00–5.00)

## 2021-01-15 MED ORDER — DENOSUMAB 120 MG/1.7ML ~~LOC~~ SOLN
120.0000 mg | Freq: Once | SUBCUTANEOUS | Status: AC
  Administered 2021-01-15: 120 mg via SUBCUTANEOUS

## 2021-01-15 MED ORDER — NAPROXEN SODIUM 220 MG PO TABS: 220.0000 mg | ORAL_TABLET | Freq: Three times a day (TID) | ORAL | 4 refills | Status: DC

## 2021-01-15 MED ORDER — TRAMADOL HCL 50 MG PO TABS: 50.0000 mg | ORAL_TABLET | Freq: Four times a day (QID) | ORAL | 0 refills | Status: DC | PRN

## 2021-01-15 MED ORDER — ACETAMINOPHEN 500 MG PO TABS: 500.0000 mg | ORAL_TABLET | Freq: Three times a day (TID) | ORAL | 0 refills | Status: DC

## 2021-01-15 MED ORDER — DENOSUMAB 120 MG/1.7ML ~~LOC~~ SOLN
SUBCUTANEOUS | Status: AC
  Filled 2021-01-15: qty 1.7

## 2021-01-15 NOTE — Telephone Encounter (Signed)
Scheduled appts per 3/24 los. Pt aware.

## 2021-01-15 NOTE — Patient Instructions (Signed)
Denosumab injection What is this medicine? DENOSUMAB (den oh sue mab) slows bone breakdown. Prolia is used to treat osteoporosis in women after menopause and in men, and in people who are taking corticosteroids for 6 months or more. Xgeva is used to treat a high calcium level due to cancer and to prevent bone fractures and other bone problems caused by multiple myeloma or cancer bone metastases. Xgeva is also used to treat giant cell tumor of the bone. This medicine may be used for other purposes; ask your health care provider or pharmacist if you have questions. COMMON BRAND NAME(S): Prolia, XGEVA What should I tell my health care provider before I take this medicine? They need to know if you have any of these conditions:  dental disease  having surgery or tooth extraction  infection  kidney disease  low levels of calcium or Vitamin D in the blood  malnutrition  on hemodialysis  skin conditions or sensitivity  thyroid or parathyroid disease  an unusual reaction to denosumab, other medicines, foods, dyes, or preservatives  pregnant or trying to get pregnant  breast-feeding How should I use this medicine? This medicine is for injection under the skin. It is given by a health care professional in a hospital or clinic setting. A special MedGuide will be given to you before each treatment. Be sure to read this information carefully each time. For Prolia, talk to your pediatrician regarding the use of this medicine in children. Special care may be needed. For Xgeva, talk to your pediatrician regarding the use of this medicine in children. While this drug may be prescribed for children as young as 13 years for selected conditions, precautions do apply. Overdosage: If you think you have taken too much of this medicine contact a poison control center or emergency room at once. NOTE: This medicine is only for you. Do not share this medicine with others. What if I miss a dose? It is  important not to miss your dose. Call your doctor or health care professional if you are unable to keep an appointment. What may interact with this medicine? Do not take this medicine with any of the following medications:  other medicines containing denosumab This medicine may also interact with the following medications:  medicines that lower your chance of fighting infection  steroid medicines like prednisone or cortisone This list may not describe all possible interactions. Give your health care provider a list of all the medicines, herbs, non-prescription drugs, or dietary supplements you use. Also tell them if you smoke, drink alcohol, or use illegal drugs. Some items may interact with your medicine. What should I watch for while using this medicine? Visit your doctor or health care professional for regular checks on your progress. Your doctor or health care professional may order blood tests and other tests to see how you are doing. Call your doctor or health care professional for advice if you get a fever, chills or sore throat, or other symptoms of a cold or flu. Do not treat yourself. This drug may decrease your body's ability to fight infection. Try to avoid being around people who are sick. You should make sure you get enough calcium and vitamin D while you are taking this medicine, unless your doctor tells you not to. Discuss the foods you eat and the vitamins you take with your health care professional. See your dentist regularly. Brush and floss your teeth as directed. Before you have any dental work done, tell your dentist you are   receiving this medicine. Do not become pregnant while taking this medicine or for 5 months after stopping it. Talk with your doctor or health care professional about your birth control options while taking this medicine. Women should inform their doctor if they wish to become pregnant or think they might be pregnant. There is a potential for serious side  effects to an unborn child. Talk to your health care professional or pharmacist for more information. What side effects may I notice from receiving this medicine? Side effects that you should report to your doctor or health care professional as soon as possible:  allergic reactions like skin rash, itching or hives, swelling of the face, lips, or tongue  bone pain  breathing problems  dizziness  jaw pain, especially after dental work  redness, blistering, peeling of the skin  signs and symptoms of infection like fever or chills; cough; sore throat; pain or trouble passing urine  signs of low calcium like fast heartbeat, muscle cramps or muscle pain; pain, tingling, numbness in the hands or feet; seizures  unusual bleeding or bruising  unusually weak or tired Side effects that usually do not require medical attention (report to your doctor or health care professional if they continue or are bothersome):  constipation  diarrhea  headache  joint pain  loss of appetite  muscle pain  runny nose  tiredness  upset stomach This list may not describe all possible side effects. Call your doctor for medical advice about side effects. You may report side effects to FDA at 1-800-FDA-1088. Where should I keep my medicine? This medicine is only given in a clinic, doctor's office, or other health care setting and will not be stored at home. NOTE: This sheet is a summary. It may not cover all possible information. If you have questions about this medicine, talk to your doctor, pharmacist, or health care provider.  2021 Elsevier/Gold Standard (2018-02-17 16:10:44)

## 2021-01-16 LAB — CANCER ANTIGEN 27.29: CA 27.29: 37.4 U/mL (ref 0.0–38.6)

## 2021-01-27 ENCOUNTER — Encounter: Payer: Self-pay | Admitting: Family Medicine

## 2021-01-27 ENCOUNTER — Encounter: Payer: Self-pay | Admitting: Oncology

## 2021-01-29 ENCOUNTER — Encounter: Payer: Self-pay | Admitting: Oncology

## 2021-02-05 ENCOUNTER — Encounter: Payer: Self-pay | Admitting: Sports Medicine

## 2021-02-05 ENCOUNTER — Ambulatory Visit
Admission: RE | Admit: 2021-02-05 | Discharge: 2021-02-05 | Disposition: A | Discharge status: Home / Self Care | Payer: Medicare Other | Source: Ambulatory Visit | Attending: Sports Medicine | Admitting: Sports Medicine

## 2021-02-05 ENCOUNTER — Ambulatory Visit (INDEPENDENT_AMBULATORY_CARE_PROVIDER_SITE_OTHER): Payer: Medicare Other | Admitting: Sports Medicine

## 2021-02-05 ENCOUNTER — Other Ambulatory Visit: Payer: Self-pay

## 2021-02-05 VITALS — BP 128/86 | Ht 67.0 in | Wt 140.0 lb

## 2021-02-05 DIAGNOSIS — M19012 Primary osteoarthritis, left shoulder: Secondary | ICD-10-CM

## 2021-02-05 DIAGNOSIS — G8929 Other chronic pain: Secondary | ICD-10-CM

## 2021-02-05 DIAGNOSIS — M25512 Pain in left shoulder: Secondary | ICD-10-CM

## 2021-02-05 MED ORDER — METHYLPREDNISOLONE ACETATE 40 MG/ML IJ SUSP
40.0000 mg | Freq: Once | INTRAMUSCULAR | Status: AC
  Administered 2021-02-05: 40 mg via INTRA_ARTICULAR

## 2021-02-05 NOTE — Progress Notes (Signed)
PCP: Binnie Rail, MD  Subjective:   HPI: Patient is a 65 y.o. female with metastatic breast cancer currently on chemotherapy and sciatica (Dx Dec 2021) here for left shoulder pain.  Last seen in 2019 by Dr. Oneida Alar for left shoulder pain. 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 She received conservative treatment with Voltaren gel and physical therapy, with improvement.  Since her diagnosis of sciatica in December 2021, she had been bed-bound with pain. She has been treated with epidural steroid injection at L4-L5 and L5 as well as tramadol with improvement. In late March she was prescribed a rolling walker and since then has been able to ambulate around the house and grocery store.  However, since the beginning of April, coinciding with this increased use of walker and her cane, she has had worsening left shoulder pain limiting her activities. She has tried massage, acupuncture, and takes Tramadol (prescribed for her back) every 6 hours during the day as well as Advil and Tylenol. The Tramadol in particular helps with her shoulder pain during the day, but it wakes her up from sleep at night. She sleeps on her right side but awakens with 8-9 / 10 left shoulder pain. The pain is sharp and aching in the front, side, and back of her shoulder. It does not radiate, and there is no associated tingling or weakness.  She used to play golf and tennis and hopes to return to these activities in the future but is unable to currently due to her limited ability to ambulate. She participated in physical therapy for her left shoulder originally in 2019, and has continued to do these exercises consistently 2 times daily. She also does Pilates and lifts light 2 lb weights a couple times per week at home. She does chest presses, but no overhead weight lifting.         Objective:  Physical Exam:  Well-developed, well-nourished.   No acute distress.  Left shoulder: There is moderate atrophy of the supraspinatus muscle belly in the suprascapular fossa.  No tenderness to palpation.  Patient has good active and passive forward flexion and internal rotation.  There is some slightly limited passive external rotation on the left compared to the right.  Some loss of active abduction as well.  Positive empty can, positive Hawkins.  4/5 strength with resisted supraspinatus.  4+/5 strength with resisted external rotation.  5/5 strength with resisted internal rotation.  Neurovascularly intact distally.    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  Her symptoms seem to be related more to her rotator cuff pathology then glenohumeral osteoarthritis.  I recommend a diagnostic/therapeutic subacromial cortisone injection.  Patient agrees.  Hopefully this will control her nighttime pain.  I would like to get an updated x-ray of her left shoulder and she will return to the office in 1 to 2 weeks for complete left shoulder ultrasound specifically to evaluate the integrity of the rotator cuff.  In the meantime, she does have an excellent understanding of rotator cuff and scapular stabilizing exercises.  I have encouraged her to resume those if not too painful.  She will also continue with her tramadol during the day since it seems to be helpful.  Consent obtained and verified. Time-out conducted. Noted no  overlying erythema, induration, or other signs of local infection. Skin prepped in a sterile fashion. Topical analgesic spray: Ethyl chloride. Joint: left shoulder (subacromial) Needle: 25g 1.5 inch Completed without difficulty. Meds: 3cc 1% xylocaine, 1cc (40mg ) depomedrol  Advised to call if fevers/chills, erythema, induration,  drainage, or persistent bleeding.

## 2021-02-09 ENCOUNTER — Inpatient Hospital Stay: Payer: Medicare Other | Attending: Oncology

## 2021-02-09 ENCOUNTER — Encounter: Payer: Self-pay | Admitting: Internal Medicine

## 2021-02-09 ENCOUNTER — Other Ambulatory Visit: Payer: Self-pay

## 2021-02-09 DIAGNOSIS — Q6589 Other specified congenital deformities of hip: Secondary | ICD-10-CM

## 2021-02-09 DIAGNOSIS — I7300 Raynaud's syndrome without gangrene: Secondary | ICD-10-CM

## 2021-02-09 DIAGNOSIS — Z170 Estrogen receptor positive status [ER+]: Secondary | ICD-10-CM

## 2021-02-09 DIAGNOSIS — C7951 Secondary malignant neoplasm of bone: Secondary | ICD-10-CM

## 2021-02-09 DIAGNOSIS — H40039 Anatomical narrow angle, unspecified eye: Secondary | ICD-10-CM

## 2021-02-09 DIAGNOSIS — C50112 Malignant neoplasm of central portion of left female breast: Secondary | ICD-10-CM | POA: Diagnosis present

## 2021-02-09 DIAGNOSIS — Z171 Estrogen receptor negative status [ER-]: Secondary | ICD-10-CM | POA: Diagnosis not present

## 2021-02-09 DIAGNOSIS — Z96643 Presence of artificial hip joint, bilateral: Secondary | ICD-10-CM

## 2021-02-09 DIAGNOSIS — M160 Bilateral primary osteoarthritis of hip: Secondary | ICD-10-CM

## 2021-02-09 LAB — COMPREHENSIVE METABOLIC PANEL
ALT: 20 U/L (ref 0–44)
AST: 28 U/L (ref 15–41)
Albumin: 3.8 g/dL (ref 3.5–5.0)
Alkaline Phosphatase: 66 U/L (ref 38–126)
Anion gap: 9 (ref 5–15)
BUN: 14 mg/dL (ref 8–23)
CO2: 27 mmol/L (ref 22–32)
Calcium: 9 mg/dL (ref 8.9–10.3)
Chloride: 100 mmol/L (ref 98–111)
Creatinine, Ser: 0.79 mg/dL (ref 0.44–1.00)
GFR, Estimated: >60 mL/min (ref >60)
Glucose, Bld: 88 mg/dL (ref 70–99)
Potassium: 4.6 mmol/L (ref 3.5–5.1)
Sodium: 136 mmol/L (ref 135–145)
Total Bilirubin: 0.3 mg/dL (ref 0.3–1.2)
Total Protein: 7 g/dL (ref 6.5–8.1)

## 2021-02-09 LAB — CBC WITH DIFFERENTIAL/PLATELET
Abs Immature Granulocytes: 0.01 10*3/uL (ref 0.00–0.07)
Basophils Absolute: 0.1 10*3/uL (ref 0.0–0.1)
Basophils Relative: 1 %
Eosinophils Absolute: 0.1 10*3/uL (ref 0.0–0.5)
Eosinophils Relative: 3 %
HCT: 32 % — ABNORMAL LOW (ref 36.0–46.0)
Hemoglobin: 10.9 g/dL — ABNORMAL LOW (ref 12.0–15.0)
Immature Granulocytes: 0 %
Lymphocytes Relative: 36 %
Lymphs Abs: 1.5 10*3/uL (ref 0.7–4.0)
MCH: 36 pg — ABNORMAL HIGH (ref 26.0–34.0)
MCHC: 34.1 g/dL (ref 30.0–36.0)
MCV: 105.6 fL — ABNORMAL HIGH (ref 80.0–100.0)
Monocytes Absolute: 0.4 10*3/uL (ref 0.1–1.0)
Monocytes Relative: 9 %
Neutro Abs: 2.1 10*3/uL (ref 1.7–7.7)
Neutrophils Relative %: 51 %
Platelets: 314 10*3/uL (ref 150–400)
RBC: 3.03 MIL/uL — ABNORMAL LOW (ref 3.87–5.11)
RDW: 13.5 % (ref 11.5–15.5)
WBC: 4.1 10*3/uL (ref 4.0–10.5)
nRBC: 0 % (ref 0.0–0.2)

## 2021-02-09 LAB — CEA (IN HOUSE-CHCC): CEA (CHCC-In House): 5.13 ng/mL — ABNORMAL HIGH (ref 0.00–5.00)

## 2021-02-10 LAB — CANCER ANTIGEN 27.29: CA 27.29: 31.2 U/mL (ref 0.0–38.6)

## 2021-02-10 NOTE — Progress Notes (Signed)
[place]  [phone] Fax:(336) [phone]     ID: Brittany [name] DOB: 1955/11/04  MR#: [id number]  TML#:[id number]  Patient Care Team: Binnie Rail, MD as PCP - General (Internal Medicine) Aloha Gell, MD as Consulting Physician (Obstetrics and Gynecology) Sydnee Levans, MD (Dermatology) Kamden Reber, Virgie Dad, MD as Consulting Physician (Oncology) Richmond Campbell, MD as Consulting Physician (Gastroenterology) Stefanie Libel, MD as Consulting Physician (Sports Medicine) Eunice Blase, MD (Family Medicine) Erline Levine, MD as Consulting Physician (Neurosurgery) Hetty Blend, Alfredo Bach, MD as Referring Physician (Orthopedic Surgery) Chauncey Cruel, MD OTHER MD:   CHIEF COMPLAINT: Stage IV lobular breast cancer  CURRENT TREATMENT: Delton See; capecitabine   INTERVAL HISTORY: Brittany [name] returns today for follow up of her stage IV lobular breast cancer.  She is accompanied by her husband.  Since her last visit, she underwent bone scan on 01/13/2021 showing: mixed response to therapy; slightly improved metastatic disease burden within thoracolumbar spine and ribs; interval progression of disease involving sternum, sacrum, and iliac bones bilaterally.  We discussed this at length today and went over how difficult it is to document progression when there is bone only disease especially lobular. I am not reading the scan as definitive evidence of progression  She was restarted on capecitabine 1000 mg in the morning only, on 03/25/2020.  She developed more palmar plantar erythrodysesthesia and stopped the medication on 01/30/2021.  She began [place] on 12/13/2019.  She is tolerating this with no side effects that she is aware of.  We are following her tumor markers, which continue to improve Lab Results  Component Value Date   CA2729 31.2 02/09/2021   CA2729 37.4 01/15/2021   CA2729 31.6 12/18/2020   CA2729 38.6 11/19/2020   CA2729 37.4 10/22/2020   Lab Results   Component Value Date   CEA1 5.13 (H) 02/09/2021   CEA1 5.06 (H) 01/15/2021   CEA1 5.21 (H) 12/18/2020   CEA1 5.39 (H) 11/19/2020   CEA1 6.17 (H) 10/22/2020    REVIEW OF SYSTEMS: Brittany [name] tells me she was able to do some walking and even some biking and then some more walking last week after some chiropractor massage but after that the pain has come back and is really limiting her.  Of course she is using a cane and when she goes walking she uses a walker.  The pain localizes to the right hip area.  She did try acupuncture once and she is going to try it again but the problem with acupuncturist the pain is not that well localized.  The peeling in her feet and hands are slightly better now that she has been off capecitabine a couple of weeks but the problem has not completely resolved.  Aside from these issues a detailed review of systems today was stable   COVID 19 VACCINATION STATUS: Status post [place] x2 with booster August 2021   HISTORY OF CURRENT ILLNESS: From the original intake note:  "Brittany" [name] has a history of left breast cancer dating back to 10.  She was followed by Dr. Beryle Beams and underwent lumpectomy, adjuvant chemotherapy, radiation and "hormonal therapy".  Per Brittany's recollection the tumor was less than 2 cm, 2 out of 10 lymph nodes were involved, and it was a lobular breast cancer.  She took tamoxifen for many years and then raloxifene.  Dr. Beryle Beams also evaluated her for leukopenia which was noted at first in 2011, worked up with a negative ANA and negative rheumatoid factors.  He felt it was  a benign residual from her earlier chemotherapy.  She was released from follow-up in 2015.  She has continued on intensified screening because of her family history.  On November 23, 2017 she had bilateral breast MRIs which were unremarkable.  Mammography November 20, 2018 showed no findings suspicious of malignancy.  Breast MRI 10/30/2019 showed a new irregular 0.4 cm enhancing focus  in the upper inner quadrant of the right breast.  The left breast continued to be unremarkable except for postoperative changes and there were no abnormal appearing lymph nodes.  However in that same MRI multiple areas of enhancement were noted in the sternum.  This had not been seen in the prior MRI.  Plan right MRI biopsy 11/16/2019 was canceled as the previously demonstrated 0.4 cm focus in the right breast showed only a tortuous vein at that location.  However bone scan 11/19/2019 obtained to follow-up on the sternal findings showed additional areas of concern at T12, T7-T11, L4 and ribs, as well as the sternum.  The patient's subsequent history is as detailed below.   PAST MEDICAL HISTORY: Past Medical History:  Diagnosis Date  . ANEMIA-NOS   . Asymptomatic varicose veins   . Bone metastases ([place]) 2021  . Breast cancer ([place]) 1996   LEFT BREAST CA  . BREAST CANCER, HX OF 01/1995 dx   s/p Lumpectomy, XRT, chemo and 86yr 65yr armidex  . Gestational diabetes mellitus in childbirth, diet controlled 1985  . Hypothyroid 09/28/2015   Dx 09/2015  . Leukopenia    mild since chemo  . OSTEOARTHRITIS   . Personal history of chemotherapy   . Personal history of radiation therapy   . SCOLIOSIS, MILD     PAST SURGICAL HISTORY: Past Surgical History:  Procedure Laterality Date  . BREAST BIOPSY    . BREAST LUMPECTOMY Left    1996  . left lumpectomy  1996  . TONSILLECTOMY    . TOTAL HIP ARTHROPLASTY     2002 & 2003-Dr. vail @ [facility]    FAMILY HISTORY Family History  Problem Relation Age of Onset  . Heart disease Father 3       AMI  . Hyperlipidemia Father   . Valvular heart disease Father 40  . Parkinson's disease Father 29  . Arthritis Mother   . Breast cancer Mother 37  . Hyperlipidemia Mother   . Osteoporosis Mother   . Pancreatic cancer Maternal Grandmother 73       presumed dx  . Diabetes Paternal Grandfather        type 2  . Breast cancer Maternal Aunt   The patient's  father died at the age of 84 from heart disease.  He had Lewy body dementia.  The patient's mother is 77 years old as of January 2021.  She is in a memory unit.  She has a history of breast cancer diagnosed in her 57s (ductal carcinoma in situ).  The patient's mother sister had breast cancer diagnosed around age 70.  The patient's mother's mother also had cancer, "internal" (possibly ovarian or gastrointestinal).  There is no cancer on the father's side of the family.   GYNECOLOGIC HISTORY:  No LMP recorded. Patient is postmenopausal. Menarche: 65 years old Age at first live birth: 65 years old [place] P 2 LMP with chemotherapy, in 1996 Contraceptive a little over a year, with no complications HRT no  Hysterectomy?  No Salpingo-oophorectomy?  No   SOCIAL HISTORY:  Dajiah graduated from [employer] with a degree in psychology and IT.  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  She and her husband Clair Gulling met while working for [employer].  Note they were in [place] at the time of the [place] disaster and probably were exposed to some radiation at that time.  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  They both finally retired 03/28/2020.  Their daughter Anderson Malta is a Pharmacist, hospital in [place].  Their son Jenny Reichmann works for the same [employer] that the patient worked for.  Note that Jenny Reichmann did the [employer].  The patient has 1 grandchild (in [place]).  The patient attends [employer]: The patient's husband is her healthcare power of attorney   HEALTH MAINTENANCE: Social History   Tobacco Use  . Smoking status: Former Smoker    Quit date: 06/25/1977    Years since quitting: 43.6  . Smokeless tobacco: Never Used  Substance Use Topics  . Alcohol use: Yes    Alcohol/week: 3.0 - 4.0 standard drinks    Types: 3 - 4 Glasses of wine per week  . Drug use: No     Colonoscopy: 2015  PAP: Up-to-date  Bone density:  11/20/2018, normal   No Known Allergies  Current Outpatient Medications  Medication Sig Dispense Refill  . acetaminophen (TYLENOL) 500 MG tablet Take 1 tablet (500 mg total) by mouth in the morning, at noon, and at bedtime. Take with aleve 220 mg 90 tablet 0  . baclofen (LIORESAL) 20 MG tablet Take 1 tablet (20 mg total) by mouth 3 (three) times daily as needed for muscle spasms. 90 tablet 3  . Calcium Citrate 250 MG TABS Take 4 tablets (1,000 mg total) by mouth daily.  0  . capecitabine (XELODA) 500 MG tablet Take 2 tablets (1,000 mg total) by mouth every morning. 120 tablet 6  . Cholecalciferol (VITAMIN D3) 1000 UNITS CAPS Take by mouth daily.    . Denosumab (XGEVA [place]) Inject into the skin.    Marland Kitchen diclofenac sodium (VOLTAREN) 1 % GEL Apply 2 g topically 3 (three) times daily as needed. 100 g 1  . gabapentin (NEURONTIN) 300 MG capsule Take 1 capsule (300 mg total) by mouth 3 (three) times daily. 180 capsule 1  . levothyroxine (SYNTHROID) 50 MCG tablet Take 1 tablet (50 mcg total) by mouth daily. 90 tablet 3  . Multiple Vitamin (MULTIVITAMIN) tablet Take 1 tablet by mouth daily.    . naproxen sodium (ALEVE) 220 MG tablet Take 1 tablet (220 mg total) by mouth in the morning, at noon, and at bedtime. Take with tylenol 500 mg 90 tablet 4  . Omega-3 Fatty Acids (FISH OIL) 1000 MG CPDR Take by mouth daily.    . traMADol (ULTRAM) 50 MG tablet Take 1-2 tablets (50-100 mg total) by mouth every 6 (six) hours as needed. 90 tablet 0   No current facility-administered medications for this visit.    OBJECTIVE:  white woman who appears stated age  89:   02/11/21 0809  BP: 132/79  Pulse: 76  Resp: 20  Temp: 97.7 F (36.5 C)  SpO2: 100%     Body mass index is 21.72 kg/m.   Wt Readings from Last 3 Encounters:  02/11/21 138 lb 11.2 oz (62.9 kg)  02/05/21 140 lb (63.5 kg)  01/15/21 139 lb 4.8 oz (63.2 kg)      ECOG FS:1 - Symptomatic but completely ambulatory  Sclerae unicteric, EOMs  intact Wearing a  mask No cervical or supraclavicular adenopathy Lungs no rales or rhonchi Heart regular rate and rhythm Abd soft, nontender, positive bowel sounds MSK no focal spinal tenderness, no upper extremity lymphedema Neuro: nonfocal, well oriented, appropriate affect Breasts: The left breast is status post lumpectomy with no evidence of recurrent disease.  There is some dimpling secondary to the surgery.  The right breast and both axillae are benign.   LAB RESULTS:  CMP     Component Value Date/Time   NA 136 02/09/2021 0827   NA 139 12/10/2013 0841   K 4.6 02/09/2021 0827   K 4.5 12/10/2013 0841   CL 100 02/09/2021 0827   CL 102 12/01/2012 0955   CO2 27 02/09/2021 0827   CO2 28 12/10/2013 0841   GLUCOSE 88 02/09/2021 0827   GLUCOSE 81 12/10/2013 0841   GLUCOSE 88 12/01/2012 0955   BUN 14 02/09/2021 0827   BUN 14 08/04/2015 0000   BUN 17.5 12/10/2013 0841   CREATININE 0.79 02/09/2021 0827   CREATININE 0.9 12/10/2013 0841   CALCIUM 9.0 02/09/2021 0827   CALCIUM 9.6 12/10/2013 0841   PROT 7.0 02/09/2021 0827   PROT 6.7 12/10/2013 0841   ALBUMIN 3.8 02/09/2021 0827   ALBUMIN 3.9 12/10/2013 0841   AST 28 02/09/2021 0827   AST 22 12/10/2013 0841   ALT 20 02/09/2021 0827   ALT 27 12/10/2013 0841   ALKPHOS 66 02/09/2021 0827   ALKPHOS 54 12/10/2013 0841   BILITOT 0.3 02/09/2021 0827   BILITOT 0.41 12/10/2013 0841   GFRNONAA >60 02/09/2021 0827   GFRAA >60 07/02/2020 1324    No results found for: TOTALPROTELP, ALBUMINELP, A1GS, A2GS, BETS, BETA2SER, GAMS, MSPIKE, SPEI  No results found for: Nils Pyle, [facility]  Lab Results  Component Value Date   WBC 4.1 02/09/2021   NEUTROABS 2.1 02/09/2021   HGB 10.9 (L) 02/09/2021   HCT 32.0 (L) 02/09/2021   MCV 105.6 (H) 02/09/2021   PLT 314 02/09/2021      Chemistry      Component Value Date/Time   NA 136 02/09/2021 0827   NA 139 12/10/2013 0841   K 4.6 02/09/2021 0827   K 4.5 12/10/2013 0841    CL 100 02/09/2021 0827   CL 102 12/01/2012 0955   CO2 27 02/09/2021 0827   CO2 28 12/10/2013 0841   BUN 14 02/09/2021 0827   BUN 14 08/04/2015 0000   BUN 17.5 12/10/2013 0841   CREATININE 0.79 02/09/2021 0827   CREATININE 0.9 12/10/2013 0841   GLU 102 08/04/2015 0000      Component Value Date/Time   CALCIUM 9.0 02/09/2021 0827   CALCIUM 9.6 12/10/2013 0841   ALKPHOS 66 02/09/2021 0827   ALKPHOS 54 12/10/2013 0841   AST 28 02/09/2021 0827   AST 22 12/10/2013 0841   ALT 20 02/09/2021 0827   ALT 27 12/10/2013 0841   BILITOT 0.3 02/09/2021 0827   BILITOT 0.41 12/10/2013 0841      No results found for: LABCA2  No components found for: QZRAQT622  No results for input(s): INR in the last 168 hours.  No results found for: LABCA2  No results found for: CAN199  Lab Results  Component Value Date   CAN125 21.1 11/23/2019    No results found for: QJF354  Lab Results  Component Value Date   CA2729 31.2 02/09/2021    No components found for: HGQUANT  Lab Results  Component Value Date   CEA1 5.13 (H) 02/09/2021   /  CEA ([facility])  Date Value Ref Range Status  02/09/2021 5.13 (H) 0.00 - 5.00 ng/mL Final    Comment:    (NOTE) This test was performed using Architect's Chemiluminescent Microparticle Immunoassay. Values obtained from different assay methods cannot be used interchangeably. Please note that 5-10% of patients who smoke may see CEA levels up to 6.9 ng/mL. Performed at [facility], [place] [address], [place], [place] [address]      No results found for: AFPTUMOR  No results found for: [place]   No results found for: HGBA, HGBA2QUANT, HGBFQUANT, HGBSQUAN (Hemoglobinopathy evaluation)   Lab Results  Component Value Date   LDH 193 12/10/2013    No results found for: IRON, TIBC, IRONPCTSAT (Iron and TIBC)  No results found for: FERRITIN  Urinalysis    Component Value Date/Time   COLORURINE YELLOW 09/17/2014  [place] 09/17/2014 0824   LABSPEC 1.015 09/17/2014 0824   PHURINE 7.5 09/17/2014 [place] 09/17/2014 0824   HGBUR NEGATIVE 09/17/2014 [place] 09/17/2014 0824   KETONESUR NEGATIVE 09/17/2014 0824   UROBILINOGEN 0.2 09/17/2014 0824   NITRITE NEGATIVE 09/17/2014 0824   LEUKOCYTESUR NEGATIVE 09/17/2014 0824    STUDIES: NM Bone Scan Whole Body  Result Date: 01/14/2021 CLINICAL DATA:  Breast cancer, known osseous metastases, assess treatment response EXAM: NUCLEAR MEDICINE WHOLE BODY BONE SCAN TECHNIQUE: Whole body anterior and posterior images were obtained approximately 3 hours after intravenous injection of radiopharmaceutical. RADIOPHARMACEUTICALS:  20.4 mCi Technetium-7m MDP IV COMPARISON:  07/22/2020 FINDINGS: Abnormal uptake of radiotracer is again identified throughout the thoracolumbar spine, most prominently at T8, T11, and T12, similar to that noted on prior examination. Additional foci of uptake scattered throughout the ribs, however, appears stable to slightly improved since prior examination. There is increasing uptake within the sternum, sacrum, and iliac bones bilaterally, however. Similar uptake within the shoulders bilaterally, likely degenerative in nature. Defects related to bilateral hip prostheses again identified. Defect involving the left anterior ribs again identified possibly the sequela of prior radiation therapy in this region. Normal soft tissue distribution. Normal uptake and excretion within the kidneys and bladder. IMPRESSION: Mixed response to therapy. While metastatic disease burden within the thoracolumbar spine and ribs appears stable to slightly improved, there has been interval progression of disease involving the sternum, sacrum, and iliac bones bilaterally. Electronically Signed   By: Fidela Salisbury MD   On: 01/14/2021 08:57   DG Shoulder Left  Result Date: 02/05/2021 CLINICAL DATA:  LEFT shoulder pain;  history breast cancer EXAM: LEFT SHOULDER - 2+ VIEW COMPARISON:  05/30/2018 FINDINGS: Scattered diffuse patchy osteosclerosis consistent with history of metastatic breast cancer. AC joint alignment normal. Advanced LEFT glenohumeral degenerative changes with bulky spur formation at LEFT humeral head/neck. Slight inferior subluxation of humeral head. No frank dislocation or fracture identified. Surgical clips at LEFT axilla. IMPRESSION: Scattered osseous metastases. Advanced LEFT glenohumeral degenerative changes with bulky spur formation at the LEFT humeral head/neck. Slight inferior subluxation of LEFT humeral head without frank dislocation. Electronically Signed   By: Lavonia Dana M.D.   On: 02/05/2021 18:55     ELIGIBLE FOR AVAILABLE RESEARCH PROTOCOL: no  ASSESSMENT: 65 y.o. [place] woman status post left lumpectomy 1996 for a T1 N1, anatomic stage II invasive lobular breast cancer, estrogen receptor positive  (a) s/p adjuvant chemotherapy with doxorubicin and cyclophosphamide x4  (b) status post adjuvant radiation  (c) status post tamoxifen for 5+ years  (1) benign  leukopenia: noted 2011, felt secondary to prior chemotherapy  (2) genetics testing 06/23/2018 through [facility] MultiCancer panel found no deleterious mutations in the 84 genes tested including BRCA 1-2  (a) 2 variants of uncertain significance were fund in [place] [c.1037C>T (p.Ser346Phe) and c.503C>G (p.ALA168Gly)  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, 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 (3) staging studies:  (a) breast MRI 10/30/2019 shows sternal mottling  (b) bone scan 11/19/2019 shows multiple areas of uptake suspicious for metastatic disease  (c) CT of the chest abdomen and pelvis 11/28/2019 shows no visceral disease  (d) tumor markers 11/23/2019 shows a CEA of 18.11, CA 27-29 of 61.2, CA 125 of 21.1  (e) F-18-estradiol PET (cerianna) 01/11/2020 shows multiple sclerotic bone lesions which do not accumulate the estrogen receptor  specific radiotracer  (f) bone marrow biopsy 01/25/2020 confirms metastatic breast cancer, gross cystic disease fluid protein positive, but estrogen and progesterone receptor negative.  HER-2/neu was 2+ on immunohistochemistry negative by FISH (1.35/1.70).  (g) CARIS confirms triple negative disease, finds a positive androgen receptor at 3+, 70%; and the PIK3CA was indeterminate.  PTEN was positive by IHC at 90%.  PD-L1 was negative.  Genomic LOH was low and MSI was stable with proficient mismatch mismatch repair.  BRCA 1 and 2 were negative.  (4) denosumab/Xgeva started 12/13/2019, repeated monthly  (5) metronomic capecitabine started 02/25/2020, with significant plantar dysesthesia developing after 1 week, drug held 03/12/2020  (a) capecitabine restarted 03/25/2020 at 1000 mg in the morning only.  (b) bone scan 07/23/2020 shows no evidence of disease progression   (c) MRI of the total spine and pelvis 11/01/2020 shows multiple bone lesions but no epidural encroachment, no compression fracture and no evidence for lymphangitic spread of disease; there is significant degenerative disease  (d) repeat bone scan 01/13/2021 difficult to interpret, no definitive progression  (e) other options include CMF, sacituzumab govitecan, bicalutamide   PLAN: Brittany [name] is now a little over a year out from definitive diagnosis of metastatic breast cancer.  She has had a good response to capecitabine but has had significant problems with palmar plantar erythrodysesthesia and we had to stop the medication 01/29/2021.  The question is where to go from here.  She has an appointment with orthopedics tomorrow.  She is bringing her films on CDs frequently but has repeat studies going to wait and see what they do because that may obviate the need to restage at this point.  I will have a virtual visit with her in 2 weeks just to catch up.  Otherwise we may set her up for repeat total spinal MRI which is our best attempt at  measurable disease and at that visit we can decide if we can go back to capecitabine her appetite and even lower dose, 1 g every other day and see how she tolerates that.  Of course we have other possibilities but nothing else I can do they cause her to lose her hair and her son is getting married in June.  She wants to be able to walk and have here for the wedding.  After that we can do other things  Total encounter time today 35 minutes.Sarajane Jews C. Britteny Fiebelkorn, MD 02/11/21 8:15 AM Medical Oncology and Hematology [facility] [place], [place] [address] Tel. [phone]    Fax. [phone]   I, Wilburn Mylar, am acting as scribe for Dr. Virgie Dad. Kyrell Ruacho.  Lindie Spruce MD, have reviewed the above documentation for accuracy and completeness, and I  agree with the above.   *Total Encounter Time as defined by the Centers for Medicare and Medicaid Services includes, in addition to the face-to-face time of a patient visit (documented in the note above) non-face-to-face time: obtaining and reviewing outside history, ordering and reviewing medications, tests or procedures, care coordination (communications with other health care professionals or caregivers) and documentation in the medical record.

## 2021-02-11 ENCOUNTER — Ambulatory Visit: Payer: Medicare Other

## 2021-02-11 ENCOUNTER — Other Ambulatory Visit: Payer: Medicare Other

## 2021-02-11 ENCOUNTER — Inpatient Hospital Stay (HOSPITAL_BASED_OUTPATIENT_CLINIC_OR_DEPARTMENT_OTHER): Payer: Medicare Other | Admitting: Oncology

## 2021-02-11 ENCOUNTER — Other Ambulatory Visit: Payer: Self-pay

## 2021-02-11 ENCOUNTER — Inpatient Hospital Stay: Payer: Medicare Other

## 2021-02-11 VITALS — BP 132/79 | HR 76 | Temp 97.7°F | Resp 20 | Ht 67.0 in | Wt 138.7 lb

## 2021-02-11 DIAGNOSIS — Z170 Estrogen receptor positive status [ER+]: Secondary | ICD-10-CM

## 2021-02-11 DIAGNOSIS — C7951 Secondary malignant neoplasm of bone: Secondary | ICD-10-CM

## 2021-02-11 DIAGNOSIS — C50112 Malignant neoplasm of central portion of left female breast: Secondary | ICD-10-CM

## 2021-02-11 MED ORDER — DENOSUMAB 120 MG/1.7ML ~~LOC~~ SOLN
SUBCUTANEOUS | Status: AC
  Filled 2021-02-11: qty 1.7

## 2021-02-11 MED ORDER — DENOSUMAB 120 MG/1.7ML ~~LOC~~ SOLN
120.0000 mg | Freq: Once | SUBCUTANEOUS | Status: AC
  Administered 2021-02-11: 120 mg via SUBCUTANEOUS

## 2021-02-11 NOTE — Patient Instructions (Signed)
Denosumab injection What is this medicine? DENOSUMAB (den oh sue mab) slows bone breakdown. Prolia is used to treat osteoporosis in women after menopause and in men, and in people who are taking corticosteroids for 6 months or more. Xgeva is used to treat a high calcium level due to cancer and to prevent bone fractures and other bone problems caused by multiple myeloma or cancer bone metastases. Xgeva is also used to treat giant cell tumor of the bone. This medicine may be used for other purposes; ask your health care provider or pharmacist if you have questions. COMMON BRAND NAME(S): Prolia, XGEVA What should I tell my health care provider before I take this medicine? They need to know if you have any of these conditions:  dental disease  having surgery or tooth extraction  infection  kidney disease  low levels of calcium or Vitamin D in the blood  malnutrition  on hemodialysis  skin conditions or sensitivity  thyroid or parathyroid disease  an unusual reaction to denosumab, other medicines, foods, dyes, or preservatives  pregnant or trying to get pregnant  breast-feeding How should I use this medicine? This medicine is for injection under the skin. It is given by a health care professional in a hospital or clinic setting. A special MedGuide will be given to you before each treatment. Be sure to read this information carefully each time. For Prolia, talk to your pediatrician regarding the use of this medicine in children. Special care may be needed. For Xgeva, talk to your pediatrician regarding the use of this medicine in children. While this drug may be prescribed for children as young as 13 years for selected conditions, precautions do apply. Overdosage: If you think you have taken too much of this medicine contact a poison control center or emergency room at once. NOTE: This medicine is only for you. Do not share this medicine with others. What if I miss a dose? It is  important not to miss your dose. Call your doctor or health care professional if you are unable to keep an appointment. What may interact with this medicine? Do not take this medicine with any of the following medications:  other medicines containing denosumab This medicine may also interact with the following medications:  medicines that lower your chance of fighting infection  steroid medicines like prednisone or cortisone This list may not describe all possible interactions. Give your health care provider a list of all the medicines, herbs, non-prescription drugs, or dietary supplements you use. Also tell them if you smoke, drink alcohol, or use illegal drugs. Some items may interact with your medicine. What should I watch for while using this medicine? Visit your doctor or health care professional for regular checks on your progress. Your doctor or health care professional may order blood tests and other tests to see how you are doing. Call your doctor or health care professional for advice if you get a fever, chills or sore throat, or other symptoms of a cold or flu. Do not treat yourself. This drug may decrease your body's ability to fight infection. Try to avoid being around people who are sick. You should make sure you get enough calcium and vitamin D while you are taking this medicine, unless your doctor tells you not to. Discuss the foods you eat and the vitamins you take with your health care professional. See your dentist regularly. Brush and floss your teeth as directed. Before you have any dental work done, tell your dentist you are   receiving this medicine. Do not become pregnant while taking this medicine or for 5 months after stopping it. Talk with your doctor or health care professional about your birth control options while taking this medicine. Women should inform their doctor if they wish to become pregnant or think they might be pregnant. There is a potential for serious side  effects to an unborn child. Talk to your health care professional or pharmacist for more information. What side effects may I notice from receiving this medicine? Side effects that you should report to your doctor or health care professional as soon as possible:  allergic reactions like skin rash, itching or hives, swelling of the face, lips, or tongue  bone pain  breathing problems  dizziness  jaw pain, especially after dental work  redness, blistering, peeling of the skin  signs and symptoms of infection like fever or chills; cough; sore throat; pain or trouble passing urine  signs of low calcium like fast heartbeat, muscle cramps or muscle pain; pain, tingling, numbness in the hands or feet; seizures  unusual bleeding or bruising  unusually weak or tired Side effects that usually do not require medical attention (report to your doctor or health care professional if they continue or are bothersome):  constipation  diarrhea  headache  joint pain  loss of appetite  muscle pain  runny nose  tiredness  upset stomach This list may not describe all possible side effects. Call your doctor for medical advice about side effects. You may report side effects to FDA at 1-800-FDA-1088. Where should I keep my medicine? This medicine is only given in a clinic, doctor's office, or other health care setting and will not be stored at home. NOTE: This sheet is a summary. It may not cover all possible information. If you have questions about this medicine, talk to your doctor, pharmacist, or health care provider.  2021 Elsevier/Gold Standard (2018-02-17 16:10:44)

## 2021-02-12 ENCOUNTER — Telehealth: Payer: Self-pay | Admitting: Oncology

## 2021-02-12 NOTE — Telephone Encounter (Signed)
Scheduled appts per 4/20 los. Called pt, no answer. Left msg with appts date and times.

## 2021-02-17 ENCOUNTER — Ambulatory Visit (INDEPENDENT_AMBULATORY_CARE_PROVIDER_SITE_OTHER): Payer: Medicare Other | Admitting: Sports Medicine

## 2021-02-17 ENCOUNTER — Ambulatory Visit: Payer: Self-pay

## 2021-02-17 ENCOUNTER — Other Ambulatory Visit: Payer: Self-pay

## 2021-02-17 VITALS — BP 122/82 | Ht 67.0 in | Wt 140.0 lb

## 2021-02-17 DIAGNOSIS — G8929 Other chronic pain: Secondary | ICD-10-CM | POA: Insufficient documentation

## 2021-02-17 DIAGNOSIS — M25512 Pain in left shoulder: Secondary | ICD-10-CM

## 2021-02-17 NOTE — Progress Notes (Addendum)
    SUBJECTIVE:   CHIEF COMPLAINT / HPI:   Left Shoulder Pain Patient is following up today for ultrasound of her left shoulder. Her x-rays were significant for severe osteoarthritis of the left shoulder. She received a corticosteroid shot in her subacromial space of the left shoulder at her last appointment which she states has helped her pain and allowed her to continue with activities.  PERTINENT  PMH / PSH: Hypothyroid, Breast cancer with bone mets  OBJECTIVE:   BP 122/82   Ht 5\' 7"  (1.702 m)   Wt 140 lb (63.5 kg)   BMI 21.93 kg/m   No flowsheet data found.  Left Shoulder: ROM is limited to 90 of flexion and abduction.  Left Complete Shoulder [place]: -Biceps tendon: Well visualized within the bicipital groove. There hypoechoic fluid accumulation around the biceps tendon seen in short and long axis view. No tearing of the biceps tendon. -Pectoralis: Insertion visualized and without abnormalities. Some fluid accumulation at the insertion. -Subscapularis: Well visualized to insertion point on humerus.  Some irregularites within the tendon seen and visualized tendon is more thin than typical. No tears seen, changes all consistent with chronic use/overuse. Dynamic testing over the coracoid did not show signs of impingement. -AC joint: No steophytes and no fluid collection, no significant separation -Supraspinatus: No calcification in the anterior fibers distal insertion of the supraspinatus.  Dynamic testing did not reveal signs of impingement. -Subacromial bursa: Minimal bursal swelling -Infraspinatus/teres minor: Insertion point on posterior humerus visualized and without abnormalities. Osteophyte development seen at glenohumeral joint. Conclusion: Chronic changes of biceps tendon and subscap tendon. No partial or full thickness tears seen. Osteophyte development seen consistent with findings on x-ray.    ASSESSMENT/PLAN:   Left shoulder pain X-ray, U/S, and physical exam consistent  with osteoarthritis of the shoulder with probable irritation of the rotator cuff tendon responding to corticosteroid injection. - Cont HEP - F/u if pain returns for possible repeat injection     Nuala Alpha, DO PGY-4, Sports Medicine Fellow [place]  Patient seen and evaluated with the sports medicine fellow.  I agree with the above plan of care.  Ultrasound findings as above.  Given her improvement with recent subacromial cortisone injection, I believe her pain is likely originating from this location and not the glenohumeral joint which has advanced arthritis.  She will continue with her home exercise program and follow-up as needed.

## 2021-02-17 NOTE — Assessment & Plan Note (Signed)
X-ray, U/S, and physical exam consistent with osteoarthritis of the shoulder with probable irritation of the rotator cuff tendon responding to corticosteroid injection. - Cont HEP - F/u if pain returns for possible repeat injection

## 2021-02-17 NOTE — Patient Instructions (Signed)
It was great to meet you today! Thank you for letting me participate in your care!  Today, we discussed your left shoulder pain which does have significant osteoarthritis and chronic changes around your biceps tendon and your subscapularis tendon. Fortunately, you do not have any evidence of a tear of your rotator cuff tendon on ultrasound today.   Please continue your at home exercise program being careful not to add too much heavy weight or resistance and not to do anything above shoulder height. Let pain be your guide in terms of activity.  Please follow up if your pain returns.  Be well, Harolyn Rutherford, DO PGY-4, Sports Medicine Fellow [place]

## 2021-02-20 ENCOUNTER — Other Ambulatory Visit: Payer: Self-pay | Admitting: Oncology

## 2021-02-23 ENCOUNTER — Other Ambulatory Visit: Payer: Self-pay | Admitting: Oncology

## 2021-02-24 ENCOUNTER — Other Ambulatory Visit: Payer: Self-pay

## 2021-02-24 ENCOUNTER — Ambulatory Visit (HOSPITAL_BASED_OUTPATIENT_CLINIC_OR_DEPARTMENT_OTHER): Payer: Medicare Other | Attending: Critical Care Medicine | Admitting: Physical Therapy

## 2021-02-24 DIAGNOSIS — G8929 Other chronic pain: Secondary | ICD-10-CM

## 2021-02-24 DIAGNOSIS — R2689 Other abnormalities of gait and mobility: Secondary | ICD-10-CM | POA: Diagnosis present

## 2021-02-24 DIAGNOSIS — M6281 Muscle weakness (generalized): Secondary | ICD-10-CM | POA: Diagnosis present

## 2021-02-24 DIAGNOSIS — M545 Low back pain, unspecified: Secondary | ICD-10-CM | POA: Insufficient documentation

## 2021-02-24 NOTE — Therapy (Signed)
[place], [place], [address] Phone: [phone]   Fax:  [phone]  Physical Therapy Evaluation  Patient Details  Name: Brittany [name] MRN: [id number] Date of Birth: June 17, 1956 Referring Provider (PT): Lura Em, MD   Encounter Date: 02/24/2021   PT End of Session - 02/24/21 1342    Visit Number 1    Number of Visits 17    Date for PT Re-Evaluation 04/23/21    Authorization Type MCR/BCBS    Progress Note Due on Visit 10    PT Start Time 1300    PT Stop Time 1342    PT Time Calculation (min) 42 min    Activity Tolerance Patient tolerated treatment well    Behavior During Therapy [facility] for tasks assessed/performed           Past Medical History:  Diagnosis Date  . ANEMIA-NOS   . Asymptomatic varicose veins   . Bone metastases ([place]) 2021  . Breast cancer ([place]) 1996   LEFT BREAST CA  . BREAST CANCER, HX OF 01/1995 dx   s/p Lumpectomy, XRT, chemo and 67yr armidex  . Gestational diabetes mellitus in childbirth, diet controlled 1985  . Hypothyroid 09/28/2015   Dx 09/2015  . Leukopenia    mild since chemo  . OSTEOARTHRITIS   . Personal history of chemotherapy   . Personal history of radiation therapy   . SCOLIOSIS, MILD     Past Surgical History:  Procedure Laterality Date  . BREAST BIOPSY    . BREAST LUMPECTOMY Left    1996  . left lumpectomy  1996  . TONSILLECTOMY    . TOTAL HIP ARTHROPLASTY     2002 & 2003-Dr. vail @ [facility]    There were no vitals filed for this visit.    Subjective Assessment - 02/24/21 1306    Subjective Stopped PT at the time to focus on lower intensity exercises and now I am feeling better. I would like to do aquatic therapy. I used to be very active but went to nothing. I have a pool at home but have a shoulder problem. I have my son's wedding in August. Just a little tightness in buttock. Stopped taking tramadol yesterday, ran out of Rx.    Pertinent History  bilat THA, She does have a history of metastatic breast cancer.  She had a bone scan in September showing stable lesions throughout the spine.    How long can you walk comfortably? to end of driveway    Diagnostic tests "Lumbar x-rays reveal slight anterolisthesis of L5 on S1 with facet arthropathy at that level and to a milder degree in the upper levels.  The hip prosthesis looks intact with good alignment, no sign of loosening. "    Patient Stated Goals get back to being active. knowledge of progression of workouts.    Currently in Pain? No/denies              [facility] PT Assessment - 02/24/21 0001      Assessment   Medical Diagnosis LBP    Referring Provider (PT) Lura Em, MD    Onset Date/Surgical Date 09/27/20   last time played golf   Hand Dominance Right    Prior Therapy yes      Precautions   Precaution Comments stenosis      Restrictions   Weight Bearing Restrictions No      Balance Screen   Has the patient fallen in the past 6  months No      Home [place] residence      Prior Function   Level of [place] Retired    Secondary school teacher, golf, tennis, travel, Government social research officer   Overall Cognitive Status Within Functional Limits for tasks assessed      Sensation   Additional Comments [facility]      Posture/Postural Control   Posture Comments thoracic kyphosis, fwd head, fwd rounded shoulders      AROM   Overall AROM Comments WFL but not tested in extension due to stenosis      Special Tests   Other special tests 5TSTS 15s      High Level Balance   High Level Balance Comments unable to maintain single leg balance- hip drop bilat                      Objective measurements completed on examination: See above findings.               PT Education - 02/24/21 1708    Education Details anatomy of condition, POC, HEP, aquatics, return to sport    Person(s) Educated Patient     Methods Explanation    Comprehension Verbalized understanding;Need further instruction            PT Short Term Goals - 02/24/21 1703      PT SHORT TERM GOAL #1   Title pt will tolerate short duration of swinging golf club to indicate improved core control    Baseline unable due to pain at eval    Time 4    Period Weeks    Status New    Target Date 03/23/21      PT SHORT TERM GOAL #2   Title 5TSTS to 12s    Baseline 15s at eval    Time 4    Period Weeks    Status New    Target Date 04/23/21             PT Long Term Goals - 02/24/21 1704      PT LONG TERM GOAL #1   Title pt will perform lateral motions in small plyometric form to return to tennis play and PLOF    Baseline unable at eval    Time 8    Period Weeks    Status New    Target Date 04/23/21      PT LONG TERM GOAL #2   Title pt will verbalize understanding for progression of body weight exercises    Baseline will progress and establish    Time 8    Period Weeks    Status New    Target Date 04/23/21      PT LONG TERM GOAL #3   Title will verbalize physical preparedness to attend sons wedding in August    Baseline will progress exercises    Time 8    Period Weeks    Status New    Target Date 04/23/21                  Plan - 02/24/21 1605    Clinical Impression Statement Pt presents to PT with complaints of LBP and hip pain with h/o bil THA. She was very active and would like to return to sports and PLOF. She was doing PT but having too much pain with progressions, left to strengthen in lower intensities and is  now feeling better and ready to return to PT. She does want to use aquatics as an opportunity to strengthen for a few weeks and then re-evaluate on land. She will benefit from skilled PT to progress strength appropriately.    Personal Factors and Comorbidities Comorbidity 3+    Comorbidities metastatic breast CA-stable lesions, stenosis, OA, mild scoliosis    Examination-Activity  Limitations Squat;Stairs;Lift;Bed Mobility;Bend;Stand;Locomotion Level;Carry;Sit    Examination-Participation Restrictions Shop;Cleaning;Community Activity;Driving    Stability/Clinical Decision Making Stable/Uncomplicated    Clinical Decision Making Low    Rehab Potential Good    PT Frequency 2x / week    PT Duration 8 weeks    PT Treatment/Interventions ADLs/Self Care Home Management;Cryotherapy;Aquatic Therapy;Moist Heat;Gait training;Functional mobility training;Stair training;Neuromuscular re-education;Balance training;Therapeutic exercise;Therapeutic activities;Patient/family education;Manual techniques;Passive range of motion;Taping    PT Next Visit Plan aquatics for 6 visits    PT Home Exercise Plan Access Code: [email or web address]    Consulted and Agree with Plan of Care Patient           Patient will benefit from skilled therapeutic intervention in order to improve the following deficits and impairments:  Difficulty walking,Decreased balance,Increased muscle spasms,Improper body mechanics,Decreased activity tolerance,Decreased strength,Postural dysfunction,Pain  Visit Diagnosis: Chronic bilateral low back pain without sciatica  Muscle weakness (generalized)  Other abnormalities of gait and mobility     Problem List Patient Active Problem List   Diagnosis Date Noted  . Left shoulder pain 02/17/2021  . Lumbar radiculopathy 10/07/2020  . 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  . Malignant neoplasm of central portion of left breast in female, estrogen receptor positive ([place]) 11/22/2019  . Bone metastases ([place]) 11/22/2019  . Anatomical narrow angle glaucoma 09/25/2018  . History of gestational diabetes 09/21/2017  . Raynaud phenomenon 09/21/2016  . 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 Left rotator cuff tear 10/07/2015  . Hypothyroid 09/28/2015  . Leukopenia 12/08/2012  . Metatarsalgia of both feet 09/21/2011  . CERVICALGIA 05/04/2010  . Asymptomatic  varicose veins 03/17/2009  . Osteoarthritis 03/17/2009  . SCOLIOSIS, MILD 03/17/2009    Evania Lyne C. Stuart Mirabile PT, DPT 02/24/21 5:18 PM   [place], [place], [address] Phone: [phone]   Fax:  [phone]  Name: Brittany [name] MRN: [id number] Date of Birth: 1956-04-07

## 2021-02-24 NOTE — Telephone Encounter (Signed)
Last Rx 3/24

## 2021-02-25 NOTE — Telephone Encounter (Signed)
refill 

## 2021-02-25 NOTE — Progress Notes (Signed)
[place]  Telephone:([phone]) [phone] Fax:(336) [phone]     ID: Brittany [name] DOB: 01-30-1956  MR#: [id number]  GQB#:[id number]  Patient Care Team: Binnie Rail, MD as PCP - General (Internal Medicine) Aloha Gell, MD as Consulting Physician (Obstetrics and Gynecology) Sydnee Levans, MD (Dermatology) Khoen Genet, Virgie Dad, MD as Consulting Physician (Oncology) Richmond Campbell, MD as Consulting Physician (Gastroenterology) Stefanie Libel, MD as Consulting Physician (Sports Medicine) Eunice Blase, MD (Family Medicine) Erline Levine, MD as Consulting Physician (Neurosurgery) Hetty Blend Alfredo Bach, MD as Referring Physician (Orthopedic Surgery) Chauncey Cruel, MD OTHER MD:  I connected with [name] on 02/26/21 at 10:30 AM EDT by video enabled telemedicine visit and verified that I am speaking with the correct person using two identifiers.   I discussed the limitations, risks, security and privacy concerns of performing an evaluation and management service by telemedicine and the availability of in-person appointments. I also discussed with the patient that there may be a patient responsible charge related to this service. The patient expressed understanding and agreed to proceed.   Other persons participating in the visit and their role in the encounter: None  Patient's location: Home Provider's location: [place]  Total time spent: 15 min   CHIEF COMPLAINT: Stage IV lobular breast cancer  CURRENT TREATMENT: Xgeva at metronomic doses   INTERVAL HISTORY: Brittany [name] was contacted today for follow up of her stage IV lobular breast cancer.    She began [place] on 12/13/2019.  She had a good response with a significant drop in her tumor marker and no evidence of progression.  However she developed significant palmar plantar erythrodysesthesia in the drug had to be discontinued.  She saw Dr. Garlan Fillers 02/17/2021 and he felt she had an  irritated rotator cuff; she is receiving physical therapy for this.  We are following her tumor markers, which continue to improve Lab Results  Component Value Date   CA2729 31.2 02/09/2021   CA2729 37.4 01/15/2021   CA2729 31.6 12/18/2020   CA2729 38.6 11/19/2020   CA2729 37.4 10/22/2020   Lab Results  Component Value Date   CEA1 5.13 (H) 02/09/2021   CEA1 5.06 (H) 01/15/2021   CEA1 5.21 (H) 12/18/2020   CEA1 5.39 (H) 11/19/2020   CEA1 6.17 (H) 10/22/2020    REVIEW OF SYSTEMS: Brittany [name] tells me the skin problems related to the capecitabine have resolved.  She is being evaluated at [facility] for possible surgery and she had an MRI there which we discussed in detail today.  She wants to be able to walk up the aisle at her son's wedding in June and she wants to have her hair at the time.  At present she has an excellent functional status.  A detailed review of systems was otherwise stable.   COVID 19 VACCINATION STATUS: Status post Pfizer x2 with booster August 2021   HISTORY OF CURRENT ILLNESS: From the original intake note:  "Brittany" [name] has a history of left breast cancer dating back to 49.  She was followed by Dr. Beryle Beams and underwent lumpectomy, adjuvant chemotherapy, radiation and "hormonal therapy".  Per Brittany's recollection the tumor was less than 2 cm, 2 out of 10 lymph nodes were involved, and it was a lobular breast cancer.  She took tamoxifen for many years and then raloxifene.  Dr. Beryle Beams also evaluated her for leukopenia which was noted at first in 2011, worked up with a negative ANA and negative rheumatoid factors.  He felt it  was a benign residual from her earlier chemotherapy.  She was released from follow-up in 2015.  She has continued on intensified screening because of her family history.  On November 23, 2017 she had bilateral breast MRIs which were unremarkable.  Mammography November 20, 2018 showed no findings suspicious of malignancy.  Breast MRI 10/30/2019 showed  a new irregular 0.4 cm enhancing focus in the upper inner quadrant of the right breast.  The left breast continued to be unremarkable except for postoperative changes and there were no abnormal appearing lymph nodes.  However in that same MRI multiple areas of enhancement were noted in the sternum.  This had not been seen in the prior MRI.  Plan right MRI biopsy 11/16/2019 was canceled as the previously demonstrated 0.4 cm focus in the right breast showed only a tortuous vein at that location.  However bone scan 11/19/2019 obtained to follow-up on the sternal findings showed additional areas of concern at T12, T7-T11, L4 and ribs, as well as the sternum.  The patient's subsequent history is as detailed below.   PAST MEDICAL HISTORY: Past Medical History:  Diagnosis Date  . ANEMIA-NOS   . Asymptomatic varicose veins   . Bone metastases ([place]) 2021  . Breast cancer ([place]) 1996   LEFT BREAST CA  . BREAST CANCER, HX OF 01/1995 dx   s/p Lumpectomy, XRT, chemo and 26yrarmidex  . Gestational diabetes mellitus in childbirth, diet controlled 1985  . Hypothyroid 09/28/2015   Dx 09/2015  . Leukopenia    mild since chemo  . OSTEOARTHRITIS   . Personal history of chemotherapy   . Personal history of radiation therapy   . SCOLIOSIS, MILD     PAST SURGICAL HISTORY: Past Surgical History:  Procedure Laterality Date  . BREAST BIOPSY    . BREAST LUMPECTOMY Left    1996  . left lumpectomy  1996  . TONSILLECTOMY    . TOTAL HIP ARTHROPLASTY     2002 & 2003-Dr. vail @ [facility]    FAMILY HISTORY Family History  Problem Relation Age of Onset  . Heart disease Father 567      AMI  . Hyperlipidemia Father   . Valvular heart disease Father 727 . Parkinson's disease Father 771 . Arthritis Mother   . Breast cancer Mother 512 . Hyperlipidemia Mother   . Osteoporosis Mother   . Pancreatic cancer Maternal Grandmother 73       presumed dx  . Diabetes Paternal Grandfather        type 2  . Breast  cancer Maternal Aunt   The patient's father died at the age of 835from heart disease.  He had Lewy body dementia.  The patient's mother is 846years old as of January 2021.  She is in a memory unit.  She has a history of breast cancer diagnosed in her 525s(ductal carcinoma in situ).  The patient's mother sister had breast cancer diagnosed around age 67  The patient's mother's mother also had cancer, "internal" (possibly ovarian or gastrointestinal).  There is no cancer on the father's side of the family.   GYNECOLOGIC HISTORY:  No LMP recorded. Patient is postmenopausal. Menarche: 65years old Age at first live birth: 65years old GConwayP 2 LMP with chemotherapy, in 1996 Contraceptive a little over a year, with no complications HRT no  Hysterectomy?  No Salpingo-oophorectomy?  No   SOCIAL HISTORY:  [employer] from [employer] a degree in psychology and  IT.  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  She and her husband Clair Gulling met while working for [employer].  Note they were in [place] at the time of the [place] disaster and probably were exposed to some radiation at that time.  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  They both finally retired 03/28/2020.  Their daughter Anderson Malta is a Pharmacist, hospital in [place].  Their son Jenny Reichmann works for the same [employer] that the patient worked for.  Note that Jenny Reichmann did the [employer].  The patient has 1 grandchild (in [place]).  The patient attends [employer]: The patient's husband is her healthcare power of attorney   HEALTH MAINTENANCE: Social History   Tobacco Use  . Smoking status: Former Smoker    Quit date: 06/25/1977    Years since quitting: 43.7  . Smokeless tobacco: Never Used  Substance Use Topics  . Alcohol use: Yes    Alcohol/week: 3.0 - 4.0 standard drinks    Types: 3 - 4 Glasses of wine per week  . Drug use: No     Colonoscopy:  2015  PAP: Up-to-date  Bone density: 11/20/2018, normal   No Known Allergies  Current Outpatient Medications  Medication Sig Dispense Refill  . acetaminophen (TYLENOL) 500 MG tablet Take 1 tablet (500 mg total) by mouth in the morning, at noon, and at bedtime. Take with aleve 220 mg 90 tablet 0  . baclofen (LIORESAL) 20 MG tablet Take 1 tablet (20 mg total) by mouth 3 (three) times daily as needed for muscle spasms. 90 tablet 3  . Calcium Citrate 250 MG TABS Take 4 tablets (1,000 mg total) by mouth daily.  0  . capecitabine (XELODA) 500 MG tablet Take 2 tablets (1,000 mg total) by mouth every morning. 120 tablet 6  . Cholecalciferol (VITAMIN D3) 1000 UNITS CAPS Take by mouth daily.    . Denosumab (XGEVA [place]) Inject into the skin.    Marland Kitchen diclofenac sodium (VOLTAREN) 1 % GEL Apply 2 g topically 3 (three) times daily as needed. 100 g 1  . gabapentin (NEURONTIN) 300 MG capsule Take 1 capsule (300 mg total) by mouth 3 (three) times daily. 180 capsule 1  . levothyroxine (SYNTHROID) 50 MCG tablet Take 1 tablet (50 mcg total) by mouth daily. 90 tablet 3  . Multiple Vitamin (MULTIVITAMIN) tablet Take 1 tablet by mouth daily.    . naproxen sodium (ALEVE) 220 MG tablet Take 1 tablet (220 mg total) by mouth in the morning, at noon, and at bedtime. Take with tylenol 500 mg 90 tablet 4  . Omega-3 Fatty Acids (FISH OIL) 1000 MG CPDR Take by mouth daily.    . traMADol (ULTRAM) 50 MG tablet Take 1-2 tablets (50-100 mg total) by mouth every 6 (six) hours as needed. 90 tablet 0   No current facility-administered medications for this visit.    OBJECTIVE:  white woman who appears stated age  There were no vitals filed for this visit.   There is no height or weight on file to calculate BMI.   Wt Readings from Last 3 Encounters:  02/17/21 140 lb (63.5 kg)  02/11/21 138 lb 11.2 oz (62.9 kg)  02/05/21 140 lb (63.5 kg)      ECOG FS:1 - Symptomatic but completely ambulatory  Telemedicine visit 02/26/2021   LAB  RESULTS:  CMP     Component Value Date/Time  NA 136 02/09/2021 0827   NA 139 12/10/2013 0841   K 4.6 02/09/2021 0827   K 4.5 12/10/2013 0841   CL 100 02/09/2021 0827   CL 102 12/01/2012 0955   CO2 27 02/09/2021 0827   CO2 28 12/10/2013 0841   GLUCOSE 88 02/09/2021 0827   GLUCOSE 81 12/10/2013 0841   GLUCOSE 88 12/01/2012 0955   BUN 14 02/09/2021 0827   BUN 14 08/04/2015 0000   BUN 17.5 12/10/2013 0841   CREATININE 0.79 02/09/2021 0827   CREATININE 0.9 12/10/2013 0841   CALCIUM 9.0 02/09/2021 0827   CALCIUM 9.6 12/10/2013 0841   PROT 7.0 02/09/2021 0827   PROT 6.7 12/10/2013 0841   ALBUMIN 3.8 02/09/2021 0827   ALBUMIN 3.9 12/10/2013 0841   AST 28 02/09/2021 0827   AST 22 12/10/2013 0841   ALT 20 02/09/2021 0827   ALT 27 12/10/2013 0841   ALKPHOS 66 02/09/2021 0827   ALKPHOS 54 12/10/2013 0841   BILITOT 0.3 02/09/2021 0827   BILITOT 0.41 12/10/2013 0841   GFRNONAA >60 02/09/2021 0827   GFRAA >60 07/02/2020 1324    No results found for: TOTALPROTELP, ALBUMINELP, A1GS, A2GS, BETS, BETA2SER, GAMS, MSPIKE, SPEI  No results found for: Nils Pyle, [facility]  Lab Results  Component Value Date   WBC 4.1 02/09/2021   NEUTROABS 2.1 02/09/2021   HGB 10.9 (L) 02/09/2021   HCT 32.0 (L) 02/09/2021   MCV 105.6 (H) 02/09/2021   PLT 314 02/09/2021      Chemistry      Component Value Date/Time   NA 136 02/09/2021 0827   NA 139 12/10/2013 0841   K 4.6 02/09/2021 0827   K 4.5 12/10/2013 0841   CL 100 02/09/2021 0827   CL 102 12/01/2012 0955   CO2 27 02/09/2021 0827   CO2 28 12/10/2013 0841   BUN 14 02/09/2021 0827   BUN 14 08/04/2015 0000   BUN 17.5 12/10/2013 0841   CREATININE 0.79 02/09/2021 0827   CREATININE 0.9 12/10/2013 0841   GLU 102 08/04/2015 0000      Component Value Date/Time   CALCIUM 9.0 02/09/2021 0827   CALCIUM 9.6 12/10/2013 0841   ALKPHOS 66 02/09/2021 0827   ALKPHOS 54 12/10/2013 0841   AST 28 02/09/2021 0827   AST 22  12/10/2013 0841   ALT 20 02/09/2021 0827   ALT 27 12/10/2013 0841   BILITOT 0.3 02/09/2021 0827   BILITOT 0.41 12/10/2013 0841      No results found for: LABCA2  No components found for: YDXAJO878  No results for input(s): INR in the last 168 hours.  No results found for: LABCA2  No results found for: CAN199  Lab Results  Component Value Date   MVE720 21.1 11/23/2019    No results found for: NOB096  Lab Results  Component Value Date   CA2729 31.2 02/09/2021    No components found for: HGQUANT  Lab Results  Component Value Date   CEA1 5.13 (H) 02/09/2021   /  CEA ([facility])  Date Value Ref Range Status  02/09/2021 5.13 (H) 0.00 - 5.00 ng/mL Final    Comment:    (NOTE) This test was performed using Architect's Chemiluminescent Microparticle Immunoassay. Values obtained from different assay methods cannot be used interchangeably. Please note that 5-10% of patients who smoke may see CEA levels up to 6.9 ng/mL. Performed at [facility], [place] [address], [place], [place] [address]      No results found  for: AFPTUMOR  No results found for: CHROMOGRNA   No results found for: HGBA, HGBA2QUANT, HGBFQUANT, HGBSQUAN (Hemoglobinopathy evaluation)   Lab Results  Component Value Date   LDH 193 12/10/2013    No results found for: IRON, TIBC, IRONPCTSAT (Iron and TIBC)  No results found for: FERRITIN  Urinalysis    Component Value Date/Time   COLORURINE YELLOW 09/17/2014 [place] 09/17/2014 0824   LABSPEC 1.015 09/17/2014 0824   PHURINE 7.5 09/17/2014 [place] 09/17/2014 [place] 09/17/2014 0824   BILIRUBINUR NEGATIVE 09/17/2014 0824   KETONESUR NEGATIVE 09/17/2014 0824   UROBILINOGEN 0.2 09/17/2014 0824   NITRITE NEGATIVE 09/17/2014 0824   LEUKOCYTESUR NEGATIVE 09/17/2014 0824    STUDIES: [place] COMPLETE JOINT SPACE STRUCTURE UP LEFT  Result Date: 02/18/2021 Left Complete  Shoulder [place]: -Biceps tendon: Well visualized within the bicipital groove. There hypoechoic fluid accumulation around the biceps tendon seen in short and long axis view. No tearing of the biceps tendon. -Pectoralis: Insertion visualized and without abnormalities. Some fluid accumulation at the insertion. -Subscapularis: Well visualized to insertion point on humerus.  Some irregularites within the tendon seen and visualized tendon is more thin than typical. No tears seen, changes all consistent with chronic use/overuse. Dynamic testing over the coracoid did not show signs of impingement. -AC joint: No steophytes and no fluid collection, no significant separation -Supraspinatus: No calcification in the anterior fibers distal insertion of the supraspinatus.  Dynamic testing did not reveal signs of impingement. -Subacromial bursa: Minimal bursal swelling -Infraspinatus/teres minor: Insertion point on posterior humerus visualized and without abnormalities. Osteophyte development seen at glenohumeral joint. Conclusion: Chronic changes of biceps tendon and subscap tendon. No partial or full thickness tears seen. Osteophyte development seen consistent with findings on x-ray.  DG Shoulder Left  Result Date: 02/05/2021 CLINICAL DATA:  LEFT shoulder pain; history breast cancer EXAM: LEFT SHOULDER - 2+ VIEW COMPARISON:  05/30/2018 FINDINGS: Scattered diffuse patchy osteosclerosis consistent with history of metastatic breast cancer. AC joint alignment normal. Advanced LEFT glenohumeral degenerative changes with bulky spur formation at LEFT humeral head/neck. Slight inferior subluxation of humeral head. No frank dislocation or fracture identified. Surgical clips at LEFT axilla. IMPRESSION: Scattered osseous metastases. Advanced LEFT glenohumeral degenerative changes with bulky spur formation at the LEFT humeral head/neck. Slight inferior subluxation of LEFT humeral head without frank dislocation. Electronically Signed   By:  Lavonia Dana M.D.   On: 02/05/2021 18:55     ELIGIBLE FOR AVAILABLE RESEARCH PROTOCOL: no  ASSESSMENT: 65 y.o. [place] woman status post left lumpectomy 1996 for a T1 N1, anatomic stage II invasive lobular breast cancer, estrogen receptor positive  (a) s/p adjuvant chemotherapy with doxorubicin and cyclophosphamide x4  (b) status post adjuvant radiation  (c) status post tamoxifen for 5+ years  (1) benign leukopenia: noted 2011, felt secondary to prior chemotherapy  (2) genetics testing 06/23/2018 through [facility] MultiCancer panel found no deleterious mutations in the 84 genes tested including BRCA 1-2  (a) 2 variants of uncertain significance were fund in [place] [c.1037C>T (p.Ser346Phe) and c.503C>G (p.ALA168Gly)  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, 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 (3) staging studies:  (a) breast MRI 10/30/2019 shows sternal mottling  (b) bone scan 11/19/2019 shows multiple areas of uptake suspicious for metastatic disease  (c) CT of the chest abdomen and pelvis 11/28/2019 shows no visceral disease  (d) tumor markers 11/23/2019 shows a CEA of 18.11, CA 27-29 of 61.2, CA 125 of 21.1  (e) F-18-estradiol PET (  cerianna) 01/11/2020 shows multiple sclerotic bone lesions which do not accumulate the estrogen receptor specific radiotracer  (f) bone marrow biopsy 01/25/2020 confirms metastatic breast cancer, gross cystic disease fluid protein positive, but estrogen and progesterone receptor negative.  HER-2/neu was 2+ on immunohistochemistry negative by FISH (1.35/1.70).  (g) CARIS confirms triple negative disease, finds a positive androgen receptor at 3+, 70%; and the PIK3CA was indeterminate.  PTEN was positive by IHC at 90%.  PD-L1 was negative.  Genomic LOH was low and MSI was stable with proficient mismatch mismatch repair.  BRCA 1 and 2 were negative.  (4) denosumab/Xgeva started 12/13/2019, repeated monthly  (5) metronomic capecitabine started 02/25/2020, with significant  plantar dysesthesia developing after 1 week, drug held 03/12/2020  (a) capecitabine restarted 03/25/2020 at 1000 mg in the morning only.  (b) bone scan 07/23/2020 shows no evidence of disease progression   (c) MRI of the total spine and pelvis 11/01/2020 shows multiple bone lesions but no epidural encroachment, no compression fracture and no evidence for lymphangitic spread of disease; there is significant degenerative disease  (d) repeat bone scan 01/13/2021 difficult to interpret, no definitive progression  (e) capecitabine was held as of 01/30/2021 with significant palmar plantar erythrodysesthesia  (f) capecitabine resume 02/26/2021 at a lower dose (1 g every other day).  (g) other options include CMF, sacituzumab govitecan, bicalutamide   PLAN: Brittany [name] is now close to a year and a half out from definitive diagnosis of metastatic breast cancer.  Her disease is well controlled although there has been a slight rise in her CEA tumor marker recently.  She has been off capecitabine for a month and her palmar plantar erythrodysesthesia has completely resolved.  Were going to try to go back to capecitabine at an even lower dose, namely 1 g every other day.  We are continuing the Xgeva as before and she is due for her next dose and a visit 03/11/2021.  At that time we will assess how she is doing on her current dose of capecitabine and set her up for further evaluation after the June wedding.    Virgie Dad. Will Schier, MD 02/26/21 7:09 PM Medical Oncology and Hematology [facility] [place], [place] [address] Tel. [phone]    Fax. [phone]   I, Wilburn Mylar, am acting as scribe for Dr. Virgie Dad. Donovan Gatchel.  I, Lurline Del MD, have reviewed the above documentation for accuracy and completeness, and I agree with the above.   *Total Encounter Time as defined by the Centers for Medicare and Medicaid Services includes, in addition to the face-to-face time of a  patient visit (documented in the note above) non-face-to-face time: obtaining and reviewing outside history, ordering and reviewing medications, tests or procedures, care coordination (communications with other health care professionals or caregivers) and documentation in the medical record.

## 2021-02-26 ENCOUNTER — Other Ambulatory Visit: Payer: Self-pay | Admitting: Oncology

## 2021-02-26 ENCOUNTER — Inpatient Hospital Stay: Payer: Medicare Other | Attending: Oncology | Admitting: Oncology

## 2021-02-26 DIAGNOSIS — C50112 Malignant neoplasm of central portion of left female breast: Secondary | ICD-10-CM | POA: Insufficient documentation

## 2021-02-26 DIAGNOSIS — Z171 Estrogen receptor negative status [ER-]: Secondary | ICD-10-CM | POA: Insufficient documentation

## 2021-02-26 DIAGNOSIS — Z170 Estrogen receptor positive status [ER+]: Secondary | ICD-10-CM | POA: Diagnosis not present

## 2021-02-26 DIAGNOSIS — C7951 Secondary malignant neoplasm of bone: Secondary | ICD-10-CM | POA: Insufficient documentation

## 2021-02-26 MED ORDER — TRAMADOL HCL 50 MG PO TABS
50.0000 mg | ORAL_TABLET | Freq: Four times a day (QID) | ORAL | 0 refills | Status: DC | PRN
Start: 2021-02-26 — End: 2021-10-09

## 2021-02-27 NOTE — Addendum Note (Signed)
Addended by: Jonna Coup on: 02/27/2021 08:15 AM   Modules accepted: Orders

## 2021-03-04 ENCOUNTER — Encounter: Payer: Self-pay | Admitting: Family Medicine

## 2021-03-04 MED ORDER — GABAPENTIN 300 MG PO CAPS: 300.0000 mg | ORAL_CAPSULE | Freq: Two times a day (BID) | ORAL | 1 refills | Status: DC

## 2021-03-05 ENCOUNTER — Other Ambulatory Visit: Payer: Self-pay | Admitting: Family Medicine

## 2021-03-05 MED ORDER — GABAPENTIN 300 MG PO CAPS: 300.0000 mg | ORAL_CAPSULE | Freq: Three times a day (TID) | ORAL | 1 refills | Status: DC

## 2021-03-09 ENCOUNTER — Encounter: Payer: Self-pay | Admitting: Oncology

## 2021-03-10 NOTE — Progress Notes (Signed)
[place]  [phone] Fax:([phone]) [phone]     ID: Brittany [name] DOB: 04/19/1956  MR#: [id number]  ZCH#:[id number]  Patient Care Team: Binnie Rail, MD as PCP - General (Internal Medicine) Aloha Gell, MD as Consulting Physician (Obstetrics and Gynecology) Sydnee Levans, MD (Dermatology) Najla Aughenbaugh, Virgie Dad, MD as Consulting Physician (Oncology) Richmond Campbell, MD as Consulting Physician (Gastroenterology) Stefanie Libel, MD as Consulting Physician (Sports Medicine) Eunice Blase, MD (Family Medicine) Erline Levine, MD as Consulting Physician (Neurosurgery) Hetty Blend, Alfredo Bach, MD as Referring Physician (Orthopedic Surgery) Chauncey Cruel, MD OTHER MD:   CHIEF COMPLAINT: Stage IV lobular breast cancer  CURRENT TREATMENT: Xgeva at metronomic doses   INTERVAL HISTORY: Brittany [name] returns today for follow up of her stage IV lobular breast cancer.   She has been on and off capecitabine since 02/25/2020 due to palmar plantar erythrodysesthesia. She has had a good response with a significant drop in her tumor marker and no evidence of progression. We planned to restart this at 1 mg every other day at her last visit on 02/26/2021, however she only just started this yesterday..  She began [place] on 12/13/2019. She is due for a dose today.  She is tolerating that with no side effects that she is aware of We are following her tumor markers, which continue to improve Lab Results  Component Value Date   CA2729 31.2 02/09/2021   CA2729 37.4 01/15/2021   CA2729 31.6 12/18/2020   CA2729 38.6 11/19/2020   CA2729 37.4 10/22/2020   Lab Results  Component Value Date   CEA1 5.94 (H) 03/11/2021   CEA1 5.13 (H) 02/09/2021   CEA1 5.06 (H) 01/15/2021   CEA1 5.21 (H) 12/18/2020   CEA1 5.39 (H) 11/19/2020    REVIEW OF SYSTEMS: Brittany [name] is walking about 3 miles most days.  She is trying to do some weights and some aqua exercises as well and also does Pilates.  She  has a significant lesion in the right ankle laterally due to always sleeping on that side.  I had suggested she try some thick stockings but that is only minimally helped.  She is going to need a boot.  The peeling and cracking of her skin is greatly improved although there is very very minimal desquamation at the very tips of her fingers.  A detailed review of systems was otherwise noncontributory   COVID 19 VACCINATION STATUS: Status post Pfizer x2 with booster August 2021   HISTORY OF CURRENT ILLNESS: From the original intake note:  "Brittany" [name] has a history of left breast cancer dating back to 62.  She was followed by Dr. Beryle Beams and underwent lumpectomy, adjuvant chemotherapy, radiation and "hormonal therapy".  Per Brittany's recollection the tumor was less than 2 cm, 2 out of 10 lymph nodes were involved, and it was a lobular breast cancer.  She took tamoxifen for many years and then raloxifene.  Dr. Beryle Beams also evaluated her for leukopenia which was noted at first in 2011, worked up with a negative ANA and negative rheumatoid factors.  He felt it was a benign residual from her earlier chemotherapy.  She was released from follow-up in 2015.  She has continued on intensified screening because of her family history.  On November 23, 2017 she had bilateral breast MRIs which were unremarkable.  Mammography November 20, 2018 showed no findings suspicious of malignancy.  Breast MRI 10/30/2019 showed a new irregular 0.4 cm enhancing focus in the upper inner quadrant of the right  breast.  The left breast continued to be unremarkable except for postoperative changes and there were no abnormal appearing lymph nodes.  However in that same MRI multiple areas of enhancement were noted in the sternum.  This had not been seen in the prior MRI.  Plan right MRI biopsy 11/16/2019 was canceled as the previously demonstrated 0.4 cm focus in the right breast showed only a tortuous vein at that  location.  However bone scan 11/19/2019 obtained to follow-up on the sternal findings showed additional areas of concern at T12, T7-T11, L4 and ribs, as well as the sternum.  The patient's subsequent history is as detailed below.   PAST MEDICAL HISTORY: Past Medical History:  Diagnosis Date  . ANEMIA-NOS   . Asymptomatic varicose veins   . Bone metastases ([place]) 2021  . Breast cancer ([place]) 1996   LEFT BREAST CA  . BREAST CANCER, HX OF 01/1995 dx   s/p Lumpectomy, XRT, chemo and 41yrarmidex  . Gestational diabetes mellitus in childbirth, diet controlled 1985  . Hypothyroid 09/28/2015   Dx 09/2015  . Leukopenia    mild since chemo  . OSTEOARTHRITIS   . Personal history of chemotherapy   . Personal history of radiation therapy   . SCOLIOSIS, MILD     PAST SURGICAL HISTORY: Past Surgical History:  Procedure Laterality Date  . BREAST BIOPSY    . BREAST LUMPECTOMY Left    1996  . left lumpectomy  1996  . TONSILLECTOMY    . TOTAL HIP ARTHROPLASTY     2002 & 2003-Dr. vail @ [facility]    FAMILY HISTORY Family History  Problem Relation Age of Onset  . Heart disease Father 530      AMI  . Hyperlipidemia Father   . Valvular heart disease Father 755 . Parkinson's disease Father 732 . Arthritis Mother   . Breast cancer Mother 519 . Hyperlipidemia Mother   . Osteoporosis Mother   . Pancreatic cancer Maternal Grandmother 73       presumed dx  . Diabetes Paternal Grandfather        type 2  . Breast cancer Maternal Aunt   The patient's father died at the age of 826from heart disease.  He had Lewy body dementia.  The patient's mother is 862years old as of January 2021.  She is in a memory unit.  She has a history of breast cancer diagnosed in her 534s(ductal carcinoma in situ).  The patient's mother sister had breast cancer diagnosed around age 67  The patient's mother's mother also had cancer, "internal" (possibly ovarian or gastrointestinal).  There is no cancer on the father's side  of the family.   GYNECOLOGIC HISTORY:  No LMP recorded. Patient is postmenopausal. Menarche: 65years old Age at first live birth: 65years old GMomeyerP 2 LMP with chemotherapy, in 1996 Contraceptive a little over a year, with no complications HRT no  Hysterectomy?  No Salpingo-oophorectomy?  No   SOCIAL HISTORY:  DShiagraduated from [employer] a degree in psychology and IT.  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  They both finally retired 03/28/2020.  Their daughter JAnderson Maltais  a Pharmacist, hospital in [place].  Their son Jenny Reichmann works for the same [employer] that the patient worked for.  Note that Jenny Reichmann did the [employer].  The patient has 1 grandchild (in [place]).  The patient attends [employer]: The patient's husband is her healthcare power of attorney   HEALTH MAINTENANCE: Social History   Tobacco Use  . Smoking status: Former Smoker    Quit date: 06/25/1977    Years since quitting: 43.7  . Smokeless tobacco: Never Used  Substance Use Topics  . Alcohol use: Yes    Alcohol/week: 3.0 - 4.0 standard drinks    Types: 3 - 4 Glasses of wine per week  . Drug use: No     Colonoscopy: 2015  PAP: Up-to-date  Bone density: 11/20/2018, normal   No Known Allergies  Current Outpatient Medications  Medication Sig Dispense Refill  . acetaminophen (TYLENOL) 500 MG tablet Take 1 tablet (500 mg total) by mouth in the morning, at noon, and at bedtime. Take with aleve 220 mg 90 tablet 0  . baclofen (LIORESAL) 20 MG tablet Take 1 tablet (20 mg total) by mouth 3 (three) times daily as needed for muscle spasms. 90 tablet 3  . Calcium Citrate 250 MG TABS Take 4 tablets (1,000 mg  total) by mouth daily.  0  . capecitabine (XELODA) 500 MG tablet Take 2 tablets (1,000 mg total) by mouth every morning. 120 tablet 6  . Cholecalciferol (VITAMIN D3) 1000 UNITS CAPS Take by mouth daily.    . Denosumab (XGEVA [place]) Inject into the skin.    Marland Kitchen diclofenac sodium (VOLTAREN) 1 % GEL Apply 2 g topically 3 (three) times daily as needed. 100 g 1  . gabapentin (NEURONTIN) 300 MG capsule Take 1 capsule (300 mg total) by mouth 3 (three) times daily. 270 capsule 1  . levothyroxine (SYNTHROID) 50 MCG tablet Take 1 tablet (50 mcg total) by mouth daily. 90 tablet 3  . Multiple Vitamin (MULTIVITAMIN) tablet Take 1 tablet by mouth daily.    . naproxen sodium (ALEVE) 220 MG tablet Take 1 tablet (220 mg total) by mouth in the morning, at noon, and at bedtime. Take with tylenol 500 mg 90 tablet 4  . Omega-3 Fatty Acids (FISH OIL) 1000 MG CPDR Take by mouth daily.    . traMADol (ULTRAM) 50 MG tablet Take 1-2 tablets (50-100 mg total) by mouth every 6 (six) hours as needed. 90 tablet 0   No current facility-administered medications for this visit.    OBJECTIVE:  white woman who appears stated age  52:   03/11/21 0844  BP: 128/85  Pulse: 82  Resp: 15  Temp: 97.7 F (36.5 C)  SpO2: 100%     Body mass index is 21.5 kg/m.   Wt Readings from Last 3 Encounters:  03/11/21 137 lb 4.8 oz (62.3 kg)  02/17/21 140 lb (63.5 kg)  02/11/21 138 lb 11.2 oz (62.9 kg)      ECOG FS:1 - Symptomatic but completely ambulatory  Sclerae unicteric, EOMs intact Wearing a mask No cervical or supraclavicular adenopathy Lungs no rales or rhonchi Heart regular rate and rhythm Abd soft, nontender, positive bowel sounds MSK no focal spinal tenderness, no upper extremity lymphedema Neuro: nonfocal, well oriented, appropriate affect Breasts: The left breast is status post remote lumpectomy and radiation.  There is a significant indentation laterally which is chronic and unchanged.  There is no evidence of local  recurrence.  The  right breast and both axillae are benign   LAB RESULTS:  CMP     Component Value Date/Time   NA 133 (L) 03/11/2021 0824   NA 139 12/10/2013 0841   K 4.5 03/11/2021 0824   K 4.5 12/10/2013 0841   CL 97 (L) 03/11/2021 0824   CL 102 12/01/2012 0955   CO2 27 03/11/2021 0824   CO2 28 12/10/2013 0841   GLUCOSE 55 (L) 03/11/2021 0824   GLUCOSE 81 12/10/2013 0841   GLUCOSE 88 12/01/2012 0955   BUN 14 03/11/2021 0824   BUN 14 08/04/2015 0000   BUN 17.5 12/10/2013 0841   CREATININE 0.79 03/11/2021 0824   CREATININE 0.9 12/10/2013 0841   CALCIUM 9.4 03/11/2021 0824   CALCIUM 9.6 12/10/2013 0841   PROT 7.1 03/11/2021 0824   PROT 6.7 12/10/2013 0841   ALBUMIN 3.7 03/11/2021 0824   ALBUMIN 3.9 12/10/2013 0841   AST 36 03/11/2021 0824   AST 22 12/10/2013 0841   ALT 25 03/11/2021 0824   ALT 27 12/10/2013 0841   ALKPHOS 59 03/11/2021 0824   ALKPHOS 54 12/10/2013 0841   BILITOT 0.3 03/11/2021 0824   BILITOT 0.41 12/10/2013 0841   GFRNONAA >60 03/11/2021 0824   GFRAA >60 07/02/2020 1324    No results found for: Ronnald Ramp, A1GS, A2GS, BETS, BETA2SER, GAMS, MSPIKE, SPEI  No results found for: Nils Pyle, [facility]  Lab Results  Component Value Date   WBC 3.7 (L) 03/11/2021   NEUTROABS 1.7 03/11/2021   HGB 11.3 (L) 03/11/2021   HCT 33.0 (L) 03/11/2021   MCV 100.9 (H) 03/11/2021   PLT 286 03/11/2021      Chemistry      Component Value Date/Time   NA 133 (L) 03/11/2021 0824   NA 139 12/10/2013 0841   K 4.5 03/11/2021 0824   K 4.5 12/10/2013 0841   CL 97 (L) 03/11/2021 0824   CL 102 12/01/2012 0955   CO2 27 03/11/2021 0824   CO2 28 12/10/2013 0841   BUN 14 03/11/2021 0824   BUN 14 08/04/2015 0000   BUN 17.5 12/10/2013 0841   CREATININE 0.79 03/11/2021 0824   CREATININE 0.9 12/10/2013 0841   GLU 102 08/04/2015 0000      Component Value Date/Time   CALCIUM 9.4 03/11/2021 0824   CALCIUM 9.6 12/10/2013 0841   ALKPHOS 59  03/11/2021 0824   ALKPHOS 54 12/10/2013 0841   AST 36 03/11/2021 0824   AST 22 12/10/2013 0841   ALT 25 03/11/2021 0824   ALT 27 12/10/2013 0841   BILITOT 0.3 03/11/2021 0824   BILITOT 0.41 12/10/2013 0841      No results found for: LABCA2  No components found for: [id number]  No results for input(s): INR in the last 168 hours.  No results found for: LABCA2  No results found for: CAN199  Lab Results  Component Value Date   WPY099 21.1 11/23/2019    No results found for: IPJ825  Lab Results  Component Value Date   CA2729 31.2 02/09/2021    No components found for: HGQUANT  Lab Results  Component Value Date   CEA1 5.94 (H) 03/11/2021   /  CEA ([facility])  Date Value Ref Range Status  03/11/2021 5.94 (H) 0.00 - 5.00 ng/mL Final    Comment:    (NOTE) This test was performed using Architect's Chemiluminescent Microparticle Immunoassay. Values obtained from different assay methods cannot be used interchangeably. Please note that 5-10% of patients who smoke may see  CEA levels up to 6.9 ng/mL. Performed at [facility], [place] [address] [place], [place] [address]      No results found for: AFPTUMOR  No results found for: CHROMOGRNA   No results found for: HGBA, HGBA2QUANT, HGBFQUANT, HGBSQUAN (Hemoglobinopathy evaluation)   Lab Results  Component Value Date   LDH 193 12/10/2013    No results found for: IRON, TIBC, IRONPCTSAT (Iron and TIBC)  No results found for: FERRITIN  Urinalysis    Component Value Date/Time   COLORURINE YELLOW 09/17/2014 [place] 09/17/2014 0824   LABSPEC 1.015 09/17/2014 0824   PHURINE 7.5 09/17/2014 [place] 09/17/2014 [place] 09/17/2014 0824   BILIRUBINUR NEGATIVE 09/17/2014 0824   KETONESUR NEGATIVE 09/17/2014 0824   UROBILINOGEN 0.2 09/17/2014 0824   NITRITE NEGATIVE 09/17/2014 0824   LEUKOCYTESUR NEGATIVE 09/17/2014 0824    STUDIES: [place]  COMPLETE JOINT SPACE STRUCTURE UP LEFT  Result Date: 02/18/2021 Left Complete Shoulder [place]: -Biceps tendon: Well visualized within the bicipital groove. There hypoechoic fluid accumulation around the biceps tendon seen in short and long axis view. No tearing of the biceps tendon. -Pectoralis: Insertion visualized and without abnormalities. Some fluid accumulation at the insertion. -Subscapularis: Well visualized to insertion point on humerus.  Some irregularites within the tendon seen and visualized tendon is more thin than typical. No tears seen, changes all consistent with chronic use/overuse. Dynamic testing over the coracoid did not show signs of impingement. -AC joint: No steophytes and no fluid collection, no significant separation -Supraspinatus: No calcification in the anterior fibers distal insertion of the supraspinatus.  Dynamic testing did not reveal signs of impingement. -Subacromial bursa: Minimal bursal swelling -Infraspinatus/teres minor: Insertion point on posterior humerus visualized and without abnormalities. Osteophyte development seen at glenohumeral joint. Conclusion: Chronic changes of biceps tendon and subscap tendon. No partial or full thickness tears seen. Osteophyte development seen consistent with findings on x-ray.    ELIGIBLE FOR AVAILABLE RESEARCH PROTOCOL: no  ASSESSMENT: 65 y.o. [place] woman status post left lumpectomy 1996 for a T1 N1, anatomic stage II invasive lobular breast cancer, estrogen receptor positive  (a) s/p adjuvant chemotherapy with doxorubicin and cyclophosphamide x4  (b) status post adjuvant radiation  (c) status post tamoxifen for 5+ years  (1) benign leukopenia: noted 2011, felt secondary to prior chemotherapy  (2) genetics testing 06/23/2018 through [facility] MultiCancer panel found no deleterious mutations in the 84 genes tested including BRCA 1-2  (a) 2 variants of uncertain significance were fund in [place] [c.1037C>T (p.Ser346Phe) and c.503C>G  (p.ALA168Gly)  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, 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 (3) staging studies:  (a) breast MRI 10/30/2019 shows sternal mottling  (b) bone scan 11/19/2019 shows multiple areas of uptake suspicious for metastatic disease  (c) CT of the chest abdomen and pelvis 11/28/2019 shows no visceral disease  (d) tumor markers 11/23/2019 shows a CEA of 18.11, CA 27-29 of 61.2, CA 125 of 21.1  (e) F-18-estradiol PET (cerianna) 01/11/2020 shows multiple sclerotic bone lesions which do not accumulate the estrogen receptor specific radiotracer  (f) bone marrow biopsy 01/25/2020 confirms metastatic breast cancer, gross cystic disease fluid protein positive, but estrogen and progesterone receptor negative.  HER-2/neu was 2+ on immunohistochemistry negative by FISH (1.35/1.70).  (g) CARIS confirms triple negative disease, finds a positive androgen receptor at 3+, 70%; and the PIK3CA was indeterminate.  PTEN was positive by IHC at 90%.  PD-L1 was negative.  Genomic LOH was low and MSI  was stable with proficient mismatch mismatch repair.  BRCA 1 and 2 were negative.  (4) denosumab/Xgeva started 12/13/2019, repeated monthly  (5) metronomic capecitabine started 02/25/2020, with significant plantar dysesthesia developing after 1 week, drug held 03/12/2020  (a) capecitabine restarted 03/25/2020 at 1000 mg in the morning only.  (b) bone scan 07/23/2020 shows no evidence of disease progression   (c) MRI of the total spine and pelvis 11/01/2020 shows multiple bone lesions but no epidural encroachment, no compression fracture and no evidence for lymphangitic spread of disease; there is significant degenerative disease  (d) repeat bone scan 01/13/2021 difficult to interpret, no definitive progression  (e) capecitabine was held as of 01/30/2021 with significant palmar plantar erythrodysesthesia  (f) capecitabine resumed 03/10/2021 at a lower dose (1 g every other day).  (g) other options include CMF,  sacituzumab govitecan, bicalutamide   PLAN: Brittany only resume the capecitabine yesterday so really she did not need to see me today.  We were unable to cancel the visit at the last minute though.  What we are going to do to make up for that is that when she returns for her next Xgeva dose in 4weeks she will have labs and she will "popping" without a formal visit.  Of course what we are trying to do is find a dose of capecitabine that she may be able to tolerate longer-term and that still may be effective.  I have commended her excellent exercise program.  We certainly have other options if she cannot tolerate the capecitabine and we could go to [place] of the next and then return to capecitabine later once she completely gets over the skin issues  I did suggest to get a boot for her right ankle and she will try to obtain that through medical supply store  Total encounter time 20 minutes.Sarajane Jews C. Kayonna Lawniczak, MD 03/11/21 12:15 PM Medical Oncology and Hematology [facility] [place], [place] [address] Tel. [phone]    Fax. [phone]   I, Wilburn Mylar, am acting as scribe for Dr. Virgie Dad. Ethyl Vila.  I, Lurline Del MD, have reviewed the above documentation for accuracy and completeness, and I agree with the above.   *Total Encounter Time as defined by the Centers for Medicare and Medicaid Services includes, in addition to the face-to-face time of a patient visit (documented in the note above) non-face-to-face time: obtaining and reviewing outside history, ordering and reviewing medications, tests or procedures, care coordination (communications with other health care professionals or caregivers) and documentation in the medical record.

## 2021-03-11 ENCOUNTER — Inpatient Hospital Stay (HOSPITAL_BASED_OUTPATIENT_CLINIC_OR_DEPARTMENT_OTHER): Payer: Medicare Other | Admitting: Oncology

## 2021-03-11 ENCOUNTER — Inpatient Hospital Stay: Payer: Medicare Other

## 2021-03-11 ENCOUNTER — Ambulatory Visit: Payer: Medicare Other

## 2021-03-11 ENCOUNTER — Other Ambulatory Visit: Payer: Self-pay

## 2021-03-11 ENCOUNTER — Telehealth: Payer: Self-pay

## 2021-03-11 VITALS — BP 128/85 | HR 82 | Temp 97.7°F | Resp 15 | Ht 67.0 in | Wt 137.3 lb

## 2021-03-11 DIAGNOSIS — Z170 Estrogen receptor positive status [ER+]: Secondary | ICD-10-CM

## 2021-03-11 DIAGNOSIS — C50112 Malignant neoplasm of central portion of left female breast: Secondary | ICD-10-CM

## 2021-03-11 DIAGNOSIS — H40039 Anatomical narrow angle, unspecified eye: Secondary | ICD-10-CM

## 2021-03-11 DIAGNOSIS — Z171 Estrogen receptor negative status [ER-]: Secondary | ICD-10-CM | POA: Diagnosis not present

## 2021-03-11 DIAGNOSIS — C7951 Secondary malignant neoplasm of bone: Secondary | ICD-10-CM | POA: Diagnosis not present

## 2021-03-11 DIAGNOSIS — M160 Bilateral primary osteoarthritis of hip: Secondary | ICD-10-CM

## 2021-03-11 DIAGNOSIS — Z96643 Presence of artificial hip joint, bilateral: Secondary | ICD-10-CM

## 2021-03-11 DIAGNOSIS — I7300 Raynaud's syndrome without gangrene: Secondary | ICD-10-CM

## 2021-03-11 DIAGNOSIS — Q6589 Other specified congenital deformities of hip: Secondary | ICD-10-CM

## 2021-03-11 LAB — CBC WITH DIFFERENTIAL/PLATELET
Abs Immature Granulocytes: 0.01 10*3/uL (ref 0.00–0.07)
Basophils Absolute: 0 10*3/uL (ref 0.0–0.1)
Basophils Relative: 1 %
Eosinophils Absolute: 0.2 10*3/uL (ref 0.0–0.5)
Eosinophils Relative: 4 %
HCT: 33 % — ABNORMAL LOW (ref 36.0–46.0)
Hemoglobin: 11.3 g/dL — ABNORMAL LOW (ref 12.0–15.0)
Immature Granulocytes: 0 %
Lymphocytes Relative: 41 %
Lymphs Abs: 1.5 10*3/uL (ref 0.7–4.0)
MCH: 34.6 pg — ABNORMAL HIGH (ref 26.0–34.0)
MCHC: 34.2 g/dL (ref 30.0–36.0)
MCV: 100.9 fL — ABNORMAL HIGH (ref 80.0–100.0)
Monocytes Absolute: 0.3 10*3/uL (ref 0.1–1.0)
Monocytes Relative: 8 %
Neutro Abs: 1.7 10*3/uL (ref 1.7–7.7)
Neutrophils Relative %: 46 %
Platelets: 286 10*3/uL (ref 150–400)
RBC: 3.27 MIL/uL — ABNORMAL LOW (ref 3.87–5.11)
RDW: 12.4 % (ref 11.5–15.5)
WBC: 3.7 10*3/uL — ABNORMAL LOW (ref 4.0–10.5)
nRBC: 0 % (ref 0.0–0.2)

## 2021-03-11 LAB — COMPREHENSIVE METABOLIC PANEL
ALT: 25 U/L (ref 0–44)
AST: 36 U/L (ref 15–41)
Albumin: 3.7 g/dL (ref 3.5–5.0)
Alkaline Phosphatase: 59 U/L (ref 38–126)
Anion gap: 9 (ref 5–15)
BUN: 14 mg/dL (ref 8–23)
CO2: 27 mmol/L (ref 22–32)
Calcium: 9.4 mg/dL (ref 8.9–10.3)
Chloride: 97 mmol/L — ABNORMAL LOW (ref 98–111)
Creatinine, Ser: 0.79 mg/dL (ref 0.44–1.00)
GFR, Estimated: >60 mL/min (ref >60)
Glucose, Bld: 55 mg/dL — ABNORMAL LOW (ref 70–99)
Potassium: 4.5 mmol/L (ref 3.5–5.1)
Sodium: 133 mmol/L — ABNORMAL LOW (ref 135–145)
Total Bilirubin: 0.3 mg/dL (ref 0.3–1.2)
Total Protein: 7.1 g/dL (ref 6.5–8.1)

## 2021-03-11 LAB — CEA (IN HOUSE-CHCC): CEA (CHCC-In House): 5.94 ng/mL — ABNORMAL HIGH (ref 0.00–5.00)

## 2021-03-11 MED ORDER — DENOSUMAB 120 MG/1.7ML ~~LOC~~ SOLN
SUBCUTANEOUS | Status: AC
  Filled 2021-03-11: qty 1.7

## 2021-03-11 MED ORDER — DENOSUMAB 120 MG/1.7ML ~~LOC~~ SOLN
120.0000 mg | Freq: Once | SUBCUTANEOUS | Status: AC
  Administered 2021-03-11: 120 mg via SUBCUTANEOUS

## 2021-03-11 NOTE — Telephone Encounter (Signed)
Left voicemail for patient to call the office back to schedule a visit with our health coach.

## 2021-03-12 LAB — CANCER ANTIGEN 27.29: CA 27.29: 33.2 U/mL (ref 0.0–38.6)

## 2021-03-16 ENCOUNTER — Ambulatory Visit (HOSPITAL_BASED_OUTPATIENT_CLINIC_OR_DEPARTMENT_OTHER): Payer: Medicare Other | Admitting: Physical Therapy

## 2021-03-16 ENCOUNTER — Other Ambulatory Visit: Payer: Self-pay

## 2021-03-16 DIAGNOSIS — G8929 Other chronic pain: Secondary | ICD-10-CM

## 2021-03-16 DIAGNOSIS — R2689 Other abnormalities of gait and mobility: Secondary | ICD-10-CM

## 2021-03-16 DIAGNOSIS — M545 Low back pain, unspecified: Secondary | ICD-10-CM | POA: Diagnosis not present

## 2021-03-16 DIAGNOSIS — M6281 Muscle weakness (generalized): Secondary | ICD-10-CM

## 2021-03-16 NOTE — Therapy (Signed)
[facility] [address] [place], [place], [address] Phone: [phone]   Fax:  [phone]  Physical Therapy Treatment  Patient Details  Name: Brittany [name] MRN: [id number] Date of Birth: [birth year] Referring Provider (PT): Lura Em, MD   Encounter Date: 03/16/2021   PT End of Session - 03/16/21 4174    Visit Number 2    Number of Visits 17    Date for PT Re-Evaluation 04/23/21    Authorization Type MCR/BCBS    Progress Note Due on Visit 10    PT Start Time 0932    PT Stop Time 1014    PT Time Calculation (min) 42 min    Activity Tolerance Patient tolerated treatment well    Behavior During Therapy [facility] for tasks assessed/performed           Past Medical History:  Diagnosis Date  . ANEMIA-NOS   . Asymptomatic varicose veins   . Bone metastases ([place]) 2021  . Breast cancer ([place]) 1996   LEFT BREAST CA  . BREAST CANCER, HX OF 01/1995 dx   s/p Lumpectomy, XRT, chemo and 24yr armidex  . Gestational diabetes mellitus in childbirth, diet controlled 1985  . Hypothyroid 09/28/2015   Dx 09/2015  . Leukopenia    mild since chemo  . OSTEOARTHRITIS   . Personal history of chemotherapy   . Personal history of radiation therapy   . SCOLIOSIS, MILD     Past Surgical History:  Procedure Laterality Date  . BREAST BIOPSY    . BREAST LUMPECTOMY Left    1996  . left lumpectomy  1996  . TONSILLECTOMY    . TOTAL HIP ARTHROPLASTY     2002 & 2003-Dr. vail @ [place]    There were no vitals filed for this visit.   Subjective Assessment - 03/16/21 0937    Subjective Had a hard time completing one lap in pool on kick board after 5 months.    Patient Stated Goals get back to being active. knowledge of progression of workouts.    Currently in Pain? No/denies                Aquatic exercises: Lateral & post walking Single leg press down- noodle behind thigh Press single weight under water- press fwd& pull back,  straight arm press down AHSS- straight leg float up Active adductor stretch- float up with LE turnout Active hip flexor/quad stretch- float wrap around ankle Squats- with kick board press & without floating planks on step Seated ABD/ADD with core, seated bicycles                      PT Short Term Goals - 02/24/21 1703      PT SHORT TERM GOAL #1   Title pt will tolerate short duration of swinging golf club to indicate improved core control    Baseline unable due to pain at eval    Time 4    Period Weeks    Status New    Target Date 03/23/21      PT SHORT TERM GOAL #2   Title 5TSTS to 12s    Baseline 15s at eval    Time 4    Period Weeks    Status New    Target Date 04/23/21             PT Long Term Goals - 02/24/21 1704      PT LONG TERM GOAL #1   Title  pt will perform lateral motions in small plyometric form to return to tennis play and PLOF    Baseline unable at eval    Time 8    Period Weeks    Status New    Target Date 04/23/21      PT LONG TERM GOAL #2   Title pt will verbalize understanding for progression of body weight exercises    Baseline will progress and establish    Time 8    Period Weeks    Status New    Target Date 04/23/21      PT LONG TERM GOAL #3   Title will verbalize physical preparedness to attend sons wedding in August    Baseline will progress exercises    Time 8    Period Weeks    Status New    Target Date 04/23/21                 Plan - 03/16/21 1019    Clinical Impression Statement Pt tol aquatic exercises very well. Entered pool via stairs with use of hand rail, water temp 88-90, depth up to 4'8". Cues required for use of core vs lumbar extensors for support. Noted balance was challenged by aggitation of water and will cont to work on this.May not do all exercises at home due to pool still being cold so I asked her to focus on the water walking with UE drag and planks on steps.    PT  Treatment/Interventions ADLs/Self Care Home Management;Cryotherapy;Aquatic Therapy;Moist Heat;Gait training;Functional mobility training;Stair training;Neuromuscular re-education;Balance training;Therapeutic exercise;Therapeutic activities;Patient/family education;Manual techniques;Passive range of motion;Taping    PT Next Visit Plan aquatics for 6 visits    PT Home Exercise Plan Access Code: [email or web address]    Consulted and Agree with Plan of Care Patient          AquaticREHABdocumentation: Water will allow for work on balance using up thrust to improve posture. The principles of viscosity will help slow movement allowing for better processing time during fall recovery practice, Water will aid with movement using the current and laminar flow while the buoyancy reduces weight bearing and Water current provides perturbations which challenge standing balance unsupported  Patient will benefit from skilled therapeutic intervention in order to improve the following deficits and impairments:  Difficulty walking,Decreased balance,Increased muscle spasms,Improper body mechanics,Decreased activity tolerance,Decreased strength,Postural dysfunction,Pain  Visit Diagnosis: Chronic bilateral low back pain without sciatica  Muscle weakness (generalized)  Other abnormalities of gait and mobility     Problem List Patient Active Problem List   Diagnosis Date Noted  . Left shoulder pain 02/17/2021  . Lumbar radiculopathy 10/07/2020  . 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  . Malignant neoplasm of central portion of left breast in female, estrogen receptor positive ([place]) 11/22/2019  . Bone metastases ([place]) 11/22/2019  . Anatomical narrow angle glaucoma 09/25/2018  . History of gestational diabetes 09/21/2017  . Raynaud phenomenon 09/21/2016  . 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 Left rotator cuff tear 10/07/2015  . Hypothyroid 09/28/2015  . Leukopenia 12/08/2012  .  Metatarsalgia of both feet 09/21/2011  . CERVICALGIA 05/04/2010  . Asymptomatic varicose veins 03/17/2009  . Osteoarthritis 03/17/2009  . SCOLIOSIS, MILD 03/17/2009   Adreona Brand C. Jeylin Woodmansee PT, DPT 03/16/21 11:25 AM [facility] [place], [place], [address] Phone: [phone]   Fax:  [phone]  Name: Brittany [name] MRN: [id number] Date of Birth: [birth year]

## 2021-03-19 ENCOUNTER — Ambulatory Visit (HOSPITAL_BASED_OUTPATIENT_CLINIC_OR_DEPARTMENT_OTHER): Payer: Medicare Other | Admitting: Physical Therapy

## 2021-03-19 ENCOUNTER — Encounter (HOSPITAL_BASED_OUTPATIENT_CLINIC_OR_DEPARTMENT_OTHER): Payer: Self-pay | Admitting: Physical Therapy

## 2021-03-19 ENCOUNTER — Other Ambulatory Visit: Payer: Self-pay

## 2021-03-19 DIAGNOSIS — M545 Low back pain, unspecified: Secondary | ICD-10-CM | POA: Diagnosis not present

## 2021-03-19 DIAGNOSIS — G8929 Other chronic pain: Secondary | ICD-10-CM

## 2021-03-19 DIAGNOSIS — M6281 Muscle weakness (generalized): Secondary | ICD-10-CM

## 2021-03-19 NOTE — Therapy (Signed)
[facility] [address] [place], [place], [address] Phone: [phone]   Fax:  [phone]  Physical Therapy Treatment  Patient Details  Name: Brittany [name] MRN: [id number] Date of Birth: 10/04/1956 Referring Provider (PT): Lura Em, MD   Encounter Date: 03/19/2021   PT End of Session - 03/19/21 1955    Visit Number 3    Number of Visits 17    Date for PT Re-Evaluation 04/23/21    Authorization Type MCR/BCBS    Progress Note Due on Visit 10    PT Start Time 0845    PT Stop Time 0925    PT Time Calculation (min) 40 min    Activity Tolerance Patient tolerated treatment well    Behavior During Therapy [facility] for tasks assessed/performed           Past Medical History:  Diagnosis Date  . ANEMIA-NOS   . Asymptomatic varicose veins   . Bone metastases ([place]) 2021  . Breast cancer ([place]) 1996   LEFT BREAST CA  . BREAST CANCER, HX OF 01/1995 dx   s/p Lumpectomy, XRT, chemo and 13yr armidex  . Gestational diabetes mellitus in childbirth, diet controlled 1985  . Hypothyroid 09/28/2015   Dx 09/2015  . Leukopenia    mild since chemo  . OSTEOARTHRITIS   . Personal history of chemotherapy   . Personal history of radiation therapy   . SCOLIOSIS, MILD     Past Surgical History:  Procedure Laterality Date  . BREAST BIOPSY    . BREAST LUMPECTOMY Left    1996  . left lumpectomy  1996  . TONSILLECTOMY    . TOTAL HIP ARTHROPLASTY     2002 & 2003-Dr. vail @ [place]    There were no vitals filed for this visit.   Subjective Assessment - 03/19/21 1955    Subjective Feeling good. Did not get in the pool due to weather this week.    Currently in Pain? No/denies            walking all directions for warmup Straight arm horizontal abduction and scissors with core activation Straight leg swings against water resistance flexion extension, abduction abduction, Small circles float stretching hamstrings And  quadriceps seated on noodle opposite need to elbow Superman flutter kicks noodle under hips Tandem stance on noodle knee ups with plyometrics and quick lifts without a hop                       PT Short Term Goals - 02/24/21 1703      PT SHORT TERM GOAL #1   Title pt will tolerate short duration of swinging golf club to indicate improved core control    Baseline unable due to pain at eval    Time 4    Period Weeks    Status New    Target Date 03/23/21      PT SHORT TERM GOAL #2   Title 5TSTS to 12s    Baseline 15s at eval    Time 4    Period Weeks    Status New    Target Date 04/23/21             PT Long Term Goals - 02/24/21 1704      PT LONG TERM GOAL #1   Title pt will perform lateral motions in small plyometric form to return to tennis play and PLOF    Baseline unable at eval    Time  8    Period Weeks    Status New    Target Date 04/23/21      PT LONG TERM GOAL #2   Title pt will verbalize understanding for progression of body weight exercises    Baseline will progress and establish    Time 8    Period Weeks    Status New    Target Date 04/23/21      PT LONG TERM GOAL #3   Title will verbalize physical preparedness to attend sons wedding in August    Baseline will progress exercises    Time 8    Period Weeks    Status New    Target Date 04/23/21                 Plan - 03/19/21 1956    Clinical Impression Statement Entered pool via stairs with use of hand rail, water temp 88-90, depth up to 4'8". Balance exercises were challenging on noodle so kept arms in water. Tends to internally rotate `feet in standing and was able to feel acitvation of hip abductors.    PT Treatment/Interventions ADLs/Self Care Home Management;Cryotherapy;Aquatic Therapy;Moist Heat;Gait training;Functional mobility training;Stair training;Neuromuscular re-education;Balance training;Therapeutic exercise;Therapeutic activities;Patient/family education;Manual  techniques;Passive range of motion;Taping    PT Next Visit Plan aquatics for 6 visits    PT Home Exercise Plan Access Code: [email or web address]    [email or web address] and Agree with Plan of Care Patient           Patient will benefit from skilled therapeutic intervention in order to improve the following deficits and impairments:  Difficulty walking,Decreased balance,Increased muscle spasms,Improper body mechanics,Decreased activity tolerance,Decreased strength,Postural dysfunction,Pain  Visit Diagnosis: Chronic bilateral low back pain without sciatica  Muscle weakness (generalized)     Problem List Patient Active Problem List   Diagnosis Date Noted  . Left shoulder pain 02/17/2021  . Lumbar radiculopathy 10/07/2020  . 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  . Malignant neoplasm of central portion of left breast in female, estrogen receptor positive ([place]) 11/22/2019  . Bone metastases ([place]) 11/22/2019  . Anatomical narrow angle glaucoma 09/25/2018  . History of gestational diabetes 09/21/2017  . Raynaud phenomenon 09/21/2016  . 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 Left rotator cuff tear 10/07/2015  . Hypothyroid 09/28/2015  . Leukopenia 12/08/2012  . Metatarsalgia of both feet 09/21/2011  . CERVICALGIA 05/04/2010  . Asymptomatic varicose veins 03/17/2009  . Osteoarthritis 03/17/2009  . SCOLIOSIS, MILD 03/17/2009   Senetra Dillin C. Trayon Krantz PT, DPT 03/19/21 8:05 PM   [facility] [place], [place], [address] Phone: [phone]   Fax:  [phone]  Name: GURNOOR SLOOP MRN: [id number] Date of Birth: 08-21-1956

## 2021-03-27 ENCOUNTER — Other Ambulatory Visit: Payer: Self-pay

## 2021-03-27 ENCOUNTER — Ambulatory Visit (HOSPITAL_BASED_OUTPATIENT_CLINIC_OR_DEPARTMENT_OTHER): Payer: Medicare Other | Attending: Critical Care Medicine | Admitting: Physical Therapy

## 2021-03-27 ENCOUNTER — Encounter (HOSPITAL_BASED_OUTPATIENT_CLINIC_OR_DEPARTMENT_OTHER): Payer: Self-pay | Admitting: Physical Therapy

## 2021-03-27 DIAGNOSIS — M6281 Muscle weakness (generalized): Secondary | ICD-10-CM | POA: Insufficient documentation

## 2021-03-27 DIAGNOSIS — M545 Low back pain, unspecified: Secondary | ICD-10-CM | POA: Diagnosis not present

## 2021-03-27 DIAGNOSIS — M25552 Pain in left hip: Secondary | ICD-10-CM | POA: Insufficient documentation

## 2021-03-27 DIAGNOSIS — G8929 Other chronic pain: Secondary | ICD-10-CM | POA: Diagnosis present

## 2021-03-27 DIAGNOSIS — R2689 Other abnormalities of gait and mobility: Secondary | ICD-10-CM | POA: Insufficient documentation

## 2021-03-27 DIAGNOSIS — M5442 Lumbago with sciatica, left side: Secondary | ICD-10-CM | POA: Insufficient documentation

## 2021-03-27 NOTE — Therapy (Signed)
[facility] [place], [place], [address] Phone: [phone]   Fax:  [phone]  Physical Therapy Treatment  Patient Details  Name: Brittany [name] MRN: [id number] Date of Birth: 06/20/1956 Referring Provider (PT): Lura Em, MD   Encounter Date: 03/27/2021   PT End of Session - 03/27/21 0847    Visit Number 4    Number of Visits 17    Date for PT Re-Evaluation 04/23/21    Authorization Type MCR/BCBS    Progress Note Due on Visit 10    PT Start Time 5643    PT Stop Time 0929    PT Time Calculation (min) 42 min    Activity Tolerance Patient tolerated treatment well    Behavior During Therapy [facility] for tasks assessed/performed           Past Medical History:  Diagnosis Date  . ANEMIA-NOS   . Asymptomatic varicose veins   . Bone metastases ([place]) 2021  . Breast cancer ([place]) 1996   LEFT BREAST CA  . BREAST CANCER, HX OF 01/1995 dx   s/p Lumpectomy, XRT, chemo and 60yr armidex  . Gestational diabetes mellitus in childbirth, diet controlled 1985  . Hypothyroid 09/28/2015   Dx 09/2015  . Leukopenia    mild since chemo  . OSTEOARTHRITIS   . Personal history of chemotherapy   . Personal history of radiation therapy   . SCOLIOSIS, MILD     Past Surgical History:  Procedure Laterality Date  . BREAST BIOPSY    . BREAST LUMPECTOMY Left    1996  . left lumpectomy  1996  . TONSILLECTOMY    . TOTAL HIP ARTHROPLASTY     2002 & 2003-Dr. vail @ [facility] [place]    There were no vitals filed for this visit.   Subjective Assessment - 03/27/21 0855    Subjective Some midline pain in lumbar spine but I think it is from my other workouts. I have been getting tired from water exercises. Was able to do breast stroke with noodles with more stamina.    Patient Stated Goals get back to being active. knowledge of progression of workouts.    Currently in Pain? Yes    Pain Score 3     Pain Location Back    Pain Orientation  Mid;Lower    Pain Descriptors / Indicators Nagging    Aggravating Factors  lifting, doing too much    Pain Relieving Factors water exercises                    Water walking- kick board for resistance all directions.  bar pull off for lumbar traction Bent over row position- pushing blue water weight down bent over row position- triceps press blue water weight  bent over row position-straight arm press down & float up Balance: Tandem with blue weight in front wide tandem with blue weight and head turns SLS with kick board SLS with kick board & hip flx/ext SLS with kick board fast abd/add of LE stretching: hamstrings at bench adductor squat lunge calf-hip flexors        PT Short Term Goals - 03/27/21 1529      PT SHORT TERM GOAL #1   Title pt will tolerate short duration of swinging golf club to indicate improved core control    Baseline will ask at next visit      PT SHORT TERM GOAL #2   Title 5TSTS to 12s    Baseline to  be evaluated at next land visit             PT Long Term Goals - 02/24/21 1704      PT LONG TERM GOAL #1   Title pt will perform lateral motions in small plyometric form to return to tennis play and PLOF    Baseline unable at eval    Time 8    Period Weeks    Status New    Target Date 04/23/21      PT LONG TERM GOAL #2   Title pt will verbalize understanding for progression of body weight exercises    Baseline will progress and establish    Time 8    Period Weeks    Status New    Target Date 04/23/21      PT LONG TERM GOAL #3   Title will verbalize physical preparedness to attend sons wedding in August    Baseline will progress exercises    Time 8    Period Weeks    Status New    Target Date 04/23/21                 Plan - 03/27/21 1526    Clinical Impression Statement Entered pool via stairs with use of hand rail, water temp 88-90, depth up to 4'8". Challenged balance with head turns today and active motion of  LE not on floor. her pool is more shallow so we added resistance with kick board for increased drag. Good form with exercise and able to recgnize core use.    PT Treatment/Interventions ADLs/Self Care Home Management;Cryotherapy;Aquatic Therapy;Moist Heat;Gait training;Functional mobility training;Stair training;Neuromuscular re-education;Balance training;Therapeutic exercise;Therapeutic activities;Patient/family education;Manual techniques;Passive range of motion;Taping    PT Next Visit Plan aquatics for 6 visits, eval STGs    PT Home Exercise Plan Access Code: [email or web address]    [email or web address] and Agree with Plan of Care Patient           Patient will benefit from skilled therapeutic intervention in order to improve the following deficits and impairments:  Difficulty walking,Decreased balance,Increased muscle spasms,Improper body mechanics,Decreased activity tolerance,Decreased strength,Postural dysfunction,Pain  Visit Diagnosis: Chronic bilateral low back pain without sciatica  Muscle weakness (generalized)  Other abnormalities of gait and mobility     Problem List Patient Active Problem List   Diagnosis Date Noted  . Left shoulder pain 02/17/2021  . Lumbar radiculopathy 10/07/2020  . 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  . Malignant neoplasm of central portion of left breast in female, estrogen receptor positive ([place]) 11/22/2019  . Bone metastases ([place]) 11/22/2019  . Anatomical narrow angle glaucoma 09/25/2018  . History of gestational diabetes 09/21/2017  . Raynaud phenomenon 09/21/2016  . 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 Left rotator cuff tear 10/07/2015  . Hypothyroid 09/28/2015  . Leukopenia 12/08/2012  . Metatarsalgia of both feet 09/21/2011  . CERVICALGIA 05/04/2010  . Asymptomatic varicose veins 03/17/2009  . Osteoarthritis 03/17/2009  . SCOLIOSIS, MILD 03/17/2009    Kellene Mccleary C. Tagan Bartram PT, DPT 03/27/21 3:30 PM   [facility] [place], [place], [address] Phone: [phone]   Fax:  [phone]  Name: Brittany [name] MRN: [id number] Date of Birth: 1955-11-11

## 2021-03-30 ENCOUNTER — Encounter (HOSPITAL_BASED_OUTPATIENT_CLINIC_OR_DEPARTMENT_OTHER): Payer: Self-pay | Admitting: Physical Therapy

## 2021-03-30 ENCOUNTER — Ambulatory Visit (HOSPITAL_BASED_OUTPATIENT_CLINIC_OR_DEPARTMENT_OTHER): Payer: Medicare Other | Admitting: Physical Therapy

## 2021-03-30 ENCOUNTER — Other Ambulatory Visit: Payer: Self-pay

## 2021-03-30 DIAGNOSIS — M6281 Muscle weakness (generalized): Secondary | ICD-10-CM

## 2021-03-30 DIAGNOSIS — M545 Low back pain, unspecified: Secondary | ICD-10-CM

## 2021-03-30 DIAGNOSIS — G8929 Other chronic pain: Secondary | ICD-10-CM

## 2021-03-30 DIAGNOSIS — R2689 Other abnormalities of gait and mobility: Secondary | ICD-10-CM

## 2021-03-30 NOTE — Therapy (Signed)
[facility] [address] [place], [place], [address] Phone: [phone]   Fax:  [phone]  Physical Therapy Treatment  Patient Details  Name: Brittany [name] MRN: [id number] Date of Birth: November 17, 1955 Referring Provider (PT): Lura Em, MD   Encounter Date: 03/30/2021   PT End of Session - 03/30/21 0933    Visit Number 5    Number of Visits 17    Date for PT Re-Evaluation 04/23/21    Authorization Type MCR/BCBS    Progress Note Due on Visit 10    PT Start Time 0932    PT Stop Time 1014    PT Time Calculation (min) 42 min    Activity Tolerance Patient tolerated treatment well    Behavior During Therapy [facility] for tasks assessed/performed           Past Medical History:  Diagnosis Date  . ANEMIA-NOS   . Asymptomatic varicose veins   . Bone metastases ([place]) 2021  . Breast cancer ([place]) 1996   LEFT BREAST CA  . BREAST CANCER, HX OF 01/1995 dx   s/p Lumpectomy, XRT, chemo and 3yr armidex  . Gestational diabetes mellitus in childbirth, diet controlled 1985  . Hypothyroid 09/28/2015   Dx 09/2015  . Leukopenia    mild since chemo  . OSTEOARTHRITIS   . Personal history of chemotherapy   . Personal history of radiation therapy   . SCOLIOSIS, MILD     Past Surgical History:  Procedure Laterality Date  . BREAST BIOPSY    . BREAST LUMPECTOMY Left    1996  . left lumpectomy  1996  . TONSILLECTOMY    . TOTAL HIP ARTHROPLASTY     2002 & 2003-Dr. vail @ [place]    There were no vitals filed for this visit.   Subjective Assessment - 03/30/21 1244    Subjective Back is feeling good today. Have been doing some putting and chipping- up to about a 1/2 swing.    Patient Stated Goals get back to being active. knowledge of progression of workouts.    Currently in Pain? No/denies             Aquatic exercises: Resisted walking with kick board perpendicular to ground moving bw shallow and deep Simulated golf swing with  blast hammer SLS trunk rotation with blast hammer HSS standing Lunge hip flexor stretch Standing hip circles x20 each direction Plyometrics: runner lunge & abd/add hops in 4'8", squat jumps in 3'6" Lumbar hip hinge fwd & lateral for stretch gastroc stretch- heel drop on step  AquaticREHABdocumentation: Water will allow for work on balance using up thrust to improve posture. The principles of viscosity will help slow movement allowing for better processing time during fall recovery practice, Pt.requires the viscosity of the water for resistance with strengthening exercises and Water current provides perturbations which challenge standing balance unsupported                           PT Short Term Goals - 03/30/21 0935      PT SHORT TERM GOAL #1   Title pt will tolerate short duration of swinging golf club to indicate improved core control    Baseline doing chipping and putting, able to move through a half swing    Status Achieved      PT SHORT TERM GOAL #2   Title 5TSTS to 12s    Baseline to be evaluated at next land visit  Status Unable to assess             PT Long Term Goals - 02/24/21 1704      PT LONG TERM GOAL #1   Title pt will perform lateral motions in small plyometric form to return to tennis play and PLOF    Baseline unable at eval    Time 8    Period Weeks    Status New    Target Date 04/23/21      PT LONG TERM GOAL #2   Title pt will verbalize understanding for progression of body weight exercises    Baseline will progress and establish    Time 8    Period Weeks    Status New    Target Date 04/23/21      PT LONG TERM GOAL #3   Title will verbalize physical preparedness to attend sons wedding in August    Baseline will progress exercises    Time 8    Period Weeks    Status New    Target Date 04/23/21                 Plan - 03/30/21 0938    Clinical Impression Statement Entered pool via stairs with use of hand rail,  water temp 88-90, depth up to 4'8". Continued to focus on core stability in all exercises and she is able toverbalize feeling core rather than her back. Used resistance today to recreate a golf swing (about half swing) in the water- swigning both directions for core challenge. Began adding gentle plyometrics- repetitive jumping in chest-deep water and squat jumps with absorbing landing to a squat in shallow water.    PT Treatment/Interventions ADLs/Self Care Home Management;Cryotherapy;Aquatic Therapy;Moist Heat;Gait training;Functional mobility training;Stair training;Neuromuscular re-education;Balance training;Therapeutic exercise;Therapeutic activities;Patient/family education;Manual techniques;Passive range of motion;Taping    PT Next Visit Plan aquatics for 6 visits, eval STGs    PT Home Exercise Plan Access Code: [email or web address]    [email or web address] and Agree with Plan of Care Patient           Patient will benefit from skilled therapeutic intervention in order to improve the following deficits and impairments:  Difficulty walking,Decreased balance,Increased muscle spasms,Improper body mechanics,Decreased activity tolerance,Decreased strength,Postural dysfunction,Pain  Visit Diagnosis: Chronic bilateral low back pain without sciatica  Muscle weakness (generalized)  Other abnormalities of gait and mobility     Problem List Patient Active Problem List   Diagnosis Date Noted  . Left shoulder pain 02/17/2021  . Lumbar radiculopathy 10/07/2020  . 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  . Malignant neoplasm of central portion of left breast in female, estrogen receptor positive ([place]) 11/22/2019  . Bone metastases ([place]) 11/22/2019  . Anatomical narrow angle glaucoma 09/25/2018  . History of gestational diabetes 09/21/2017  . Raynaud phenomenon 09/21/2016  . 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 Left rotator cuff tear 10/07/2015  . Hypothyroid 09/28/2015  .  Leukopenia 12/08/2012  . Metatarsalgia of both feet 09/21/2011  . CERVICALGIA 05/04/2010  . Asymptomatic varicose veins 03/17/2009  . Osteoarthritis 03/17/2009  . SCOLIOSIS, MILD 03/17/2009    Meng Winterton C. Markeria Goetsch PT, DPT 03/30/21 12:55 PM   [facility] [place], [place], [address] Phone: [phone]   Fax:  [phone]  Name: MARQUEL SPOTO MRN: [id number] Date of Birth: 1956/04/29

## 2021-04-02 ENCOUNTER — Encounter (HOSPITAL_BASED_OUTPATIENT_CLINIC_OR_DEPARTMENT_OTHER): Payer: Self-pay | Admitting: Physical Therapy

## 2021-04-02 ENCOUNTER — Ambulatory Visit (HOSPITAL_BASED_OUTPATIENT_CLINIC_OR_DEPARTMENT_OTHER): Payer: Medicare Other | Admitting: Physical Therapy

## 2021-04-02 ENCOUNTER — Other Ambulatory Visit: Payer: Self-pay

## 2021-04-02 DIAGNOSIS — M6281 Muscle weakness (generalized): Secondary | ICD-10-CM

## 2021-04-02 DIAGNOSIS — M545 Low back pain, unspecified: Secondary | ICD-10-CM | POA: Diagnosis not present

## 2021-04-02 DIAGNOSIS — R2689 Other abnormalities of gait and mobility: Secondary | ICD-10-CM

## 2021-04-02 DIAGNOSIS — M25552 Pain in left hip: Secondary | ICD-10-CM

## 2021-04-02 DIAGNOSIS — G8929 Other chronic pain: Secondary | ICD-10-CM

## 2021-04-02 NOTE — Therapy (Signed)
[facility] [address] [place], [place], [address] Phone: [phone]   Fax:  [phone]  Physical Therapy Treatment  Patient Details  Name: Brittany [name] MRN: [id number] Date of Birth: September 09, 1956 Referring Provider (PT): Lura Em, MD   Encounter Date: 04/02/2021   PT End of Session - 04/02/21 0929     Visit Number 6    Number of Visits 17    Date for PT Re-Evaluation 04/23/21    Authorization Type MCR/BCBS    Progress Note Due on Visit 10    PT Start Time 0935             Past Medical History:  Diagnosis Date   ANEMIA-NOS    Asymptomatic varicose veins    Bone metastases ([place]) 2021   Breast cancer ([place]) 1996   LEFT BREAST CA   BREAST CANCER, HX OF 01/1995 dx   s/p Lumpectomy, XRT, chemo and 25yr armidex   Gestational diabetes mellitus in childbirth, diet controlled 1985   Hypothyroid 09/28/2015   Dx 09/2015   Leukopenia    mild since chemo   OSTEOARTHRITIS    Personal history of chemotherapy    Personal history of radiation therapy    SCOLIOSIS, MILD     Past Surgical History:  Procedure Laterality Date   BREAST BIOPSY     BREAST LUMPECTOMY Left    1996   left lumpectomy  [place]     2002 & 2003-Dr. vail @ [facility]    There were no vitals filed for this visit.   Subjective Assessment - 04/02/21 0927     Subjective I did some gentle golf swings and back felt fine. What happened was- I carried water jugs and fed birds which made my back really sore.    Patient Stated Goals get back to being active. knowledge of progression of workouts.                 aquatic exercises: Walking shllow-deep for warmup Hip hinge- kick board reach & press down 5lb on wrists: hip hinge reach down, sit<>stand reaching for ankles, static squat biceps & triceps Chops water dumbell, straight arm press down Push/row with kick board Iso kick board hold with LE  circles- slow with large amplitude     AquaticREHABdocumentation: Water will allow for reduced gait deviation due to reduced joint loading through buoyancy to help patient improve posture without excess stress and pain. , Water will allow for work on balance using up thrust to improve posture. The principles of viscosity will help slow movement allowing for better processing time during fall recovery practice, and Water will aid with movement using the current and laminar flow while the buoyancy reduces weight bearing                    PT Short Term Goals - 03/30/21 0935       PT SHORT TERM GOAL #1   Title pt will tolerate short duration of swinging golf club to indicate improved core control    Baseline doing chipping and putting, able to move through a half swing    Status Achieved      PT SHORT TERM GOAL #2   Title 5TSTS to 12s    Baseline to be evaluated at next land visit    Status Unable to assess  PT Long Term Goals - 02/24/21 1704       PT LONG TERM GOAL #1   Title pt will perform lateral motions in small plyometric form to return to tennis play and PLOF    Baseline unable at eval    Time 8    Period Weeks    Status New    Target Date 04/23/21      PT LONG TERM GOAL #2   Title pt will verbalize understanding for progression of body weight exercises    Baseline will progress and establish    Time 8    Period Weeks    Status New    Target Date 04/23/21      PT LONG TERM GOAL #3   Title will verbalize physical preparedness to attend sons wedding in August    Baseline will progress exercises    Time 8    Period Weeks    Status New    Target Date 04/23/21                    Patient will benefit from skilled therapeutic intervention in order to improve the following deficits and impairments:     Visit Diagnosis: No diagnosis found.     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   Malignant neoplasm of central portion of left breast in female, estrogen receptor positive ([place]) 11/22/2019   Bone metastases ([place]) 11/22/2019   Anatomical narrow angle glaucoma 09/25/2018   History of gestational diabetes 09/21/2017   Raynaud phenomenon 09/21/2016   History of total replacement of both hip joints 01/12/2016   Left rotator cuff tear 10/07/2015   Hypothyroid 09/28/2015   Leukopenia 12/08/2012   Metatarsalgia of both feet 09/21/2011   CERVICALGIA 05/04/2010   Asymptomatic varicose veins 03/17/2009   Osteoarthritis 03/17/2009   SCOLIOSIS, MILD 03/17/2009   Brittany [name] PT, DPT 04/02/21 2:20 PM  [facility] [place], [place], [address] Phone: [phone]   Fax:  [phone]  Name: Brittany [name] MRN: [id number] Date of Birth: 09/13/1956

## 2021-04-06 ENCOUNTER — Ambulatory Visit (HOSPITAL_BASED_OUTPATIENT_CLINIC_OR_DEPARTMENT_OTHER): Payer: Medicare Other | Admitting: Physical Therapy

## 2021-04-06 ENCOUNTER — Other Ambulatory Visit: Payer: Self-pay

## 2021-04-06 ENCOUNTER — Encounter (HOSPITAL_BASED_OUTPATIENT_CLINIC_OR_DEPARTMENT_OTHER): Payer: Self-pay | Admitting: Physical Therapy

## 2021-04-06 DIAGNOSIS — R2689 Other abnormalities of gait and mobility: Secondary | ICD-10-CM

## 2021-04-06 DIAGNOSIS — M545 Low back pain, unspecified: Secondary | ICD-10-CM | POA: Diagnosis not present

## 2021-04-06 DIAGNOSIS — M6281 Muscle weakness (generalized): Secondary | ICD-10-CM

## 2021-04-06 DIAGNOSIS — G8929 Other chronic pain: Secondary | ICD-10-CM

## 2021-04-06 NOTE — Therapy (Signed)
Brittany [name], [place], [address] Phone: [phone]   Fax:  [phone]  Physical Therapy Treatment Progress Note Reporting Period 02/24/2021 to 04/06/2021   See note below for Objective Data and Assessment of Progress/Goals.      Patient Details  Name: Brittany [name] MRN: [id number] Date of Birth: [birth year] Referring Provider (PT): Lura Em, MD   Encounter Date: 04/06/2021   PT End of Session - 04/06/21 1010     Visit Number 7    Number of Visits 17    Date for PT Re-Evaluation 05/29/21   incr time d/t being out of town 2 weeks   Authorization Type MCR/BCBS    Progress Note Due on Visit 17    PT Start Time 0930    PT Stop Time 1010    PT Time Calculation (min) 40 min    Activity Tolerance Patient tolerated treatment well    Behavior During Therapy [facility] for tasks assessed/performed             Past Medical History:  Diagnosis Date   ANEMIA-NOS    Asymptomatic varicose veins    Bone metastases ([place]) 2021   Breast cancer ([place]) 1996   LEFT BREAST CA   BREAST CANCER, HX OF 01/1995 dx   s/p Lumpectomy, XRT, chemo and 33yrarmidex   Gestational diabetes mellitus in childbirth, diet controlled 1985   Hypothyroid 09/28/2015   Dx 09/2015   Leukopenia    mild since chemo   OSTEOARTHRITIS    Personal history of chemotherapy    Personal history of radiation therapy    SCOLIOSIS, MILD     Past Surgical History:  Procedure Laterality Date   BREAST BIOPSY     BREAST LUMPECTOMY Left    1996   left lumpectomy  [facility]    2002 & [place] [facility]   There were no vitals filed for this visit.   Subjective Assessment - 04/06/21 0931     Subjective Had my sons wedding this weekend so I am a little sore today but better. my ankles were really swollen but are better now- slight edema in Lt ankle. .[place].    Patient Stated Goals get back to being active. knowledge of progression of workouts.    Currently in Pain? Yes    Pain Score 4     Pain Location Back    Pain Orientation Lower    Pain Descriptors / Indicators Sore    Aggravating Factors  lots of activity    Pain Relieving Factors water exercise, rest                [facility] - 04/06/21 0001       Assessment   Medical Diagnosis LBP    Referring Provider (PT) NLura Em MD    Onset Date/Surgical Date 09/27/20      Precautions   Precaution Comments stenosis      Restrictions   Weight Bearing Restrictions No      Balance Screen   Has the patient fallen in the past 6 months No      [place]    Living Arrangements Spouse/significant other      Prior Function   Level of Independence Independent    Vocation Retired    LSecondary school teacher  golf, tennis, travel, cycling      Cognition   Overall Cognitive Status Within Functional Limits for tasks assessed      Sensation   Additional Comments [facility]      Posture/Postural Control   Posture Comments incr kyphosis      AROM   Overall AROM Comments WFL without pain      Special Tests   Other special tests 9s      Ambulation/Gait   Gait Comments femoral IR/genu valgus in plyometrics & quick stops      High Level Balance   High Level Balance Comments 10s bil with limited stability                           [facility] Adult PT Treatment/Exercise - 04/06/21 0001       Exercises   Exercises Lumbar      Lumbar Exercises: Standing   Other Standing Lumbar Exercises wall sit- abd press into hands    Other Standing Lumbar Exercises forward T & clock reaches with bar      Lumbar Exercises: Prone   Other Prone Lumbar Exercises knee/elbow planks- added alt hip ext                    PT Education - 04/06/21 1022     Education Details goals, exercise form/rationale, POC     Person(s) Educated Patient    Methods Explanation;Demonstration;Tactile cues;Verbal cues;Handout    Comprehension Verbalized understanding;Returned demonstration;Verbal cues required;Tactile cues required;Need further instruction              PT Short Term Goals - 04/06/21 0935       PT SHORT TERM GOAL #1   Title pt will tolerate short duration of swinging golf club to indicate improved core control    Status Achieved      PT SHORT TERM GOAL #2   Title 5TSTS to 12s    Baseline 9s    Status Achieved               PT Long Term Goals - 04/06/21 0936       PT LONG TERM GOAL #1   Title pt will perform lateral motions in small plyometric form to return to tennis play and PLOF    Baseline improved tolerance with notable balance difficulties in landing & direction changes    Status On-going      PT LONG TERM GOAL #2   Title pt will verbalize understanding for progression of body weight exercises    Baseline met at this point but requires further progresion to meet goals.    Status Partially Met      PT LONG TERM GOAL #3   Title will verbalize physical preparedness to attend sons wedding in August    Status Achieved      PT LONG TERM GOAL #4   Title Lumbar ROM improved to [facility] with minimal amount of pain    Status Achieved      PT LONG TERM GOAL #5   Title able to demo LLE control in CKC by reducing femoral IR    Baseline most notable in Lt, slight on Rt    Target Date 05/29/21                   Plan - 04/06/21 1010     Clinical Impression Statement Significant improvement in functional strength demo in 5tsts as well as increase  in balance demo in SLS. Will be out of town for 2 weeks and then will return for 4 land visits to challenge strength and plyometric motions followed by 4 more aquatic visits to continue with progressions.    PT Frequency 1x / week    PT Duration 6 weeks    PT Treatment/Interventions ADLs/Self Care Home  Management;Cryotherapy;Aquatic Therapy;Moist Heat;Gait training;Functional mobility training;Stair training;Neuromuscular re-education;Balance training;Therapeutic exercise;Therapeutic activities;Patient/family education;Manual techniques;Passive range of motion;Taping    PT Next Visit Plan land plyometrics & CKC strengthening    PT Home Exercise Plan Access Code: [email or web address]    Consulted and Agree with Plan of Care Patient             Patient will benefit from skilled therapeutic intervention in order to improve the following deficits and impairments:  Difficulty walking, Decreased balance, Increased muscle spasms, Improper body mechanics, Decreased activity tolerance, Decreased strength, Postural dysfunction, Pain  Visit Diagnosis: Chronic bilateral low back pain without sciatica  Muscle weakness (generalized)  Other abnormalities of gait and mobility     Problem List Patient Active Problem List   Diagnosis Date Noted   Left shoulder pain 02/17/2021   Lumbar radiculopathy 10/07/2020   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   Malignant neoplasm of central portion of left breast in female, estrogen receptor positive ([place]) 11/22/2019   Bone metastases ([place]) 11/22/2019   Anatomical narrow angle glaucoma 09/25/2018   History of gestational diabetes 09/21/2017   Raynaud phenomenon 09/21/2016   History of total replacement of both hip joints 01/12/2016   Left rotator cuff tear 10/07/2015   Hypothyroid 09/28/2015   Leukopenia 12/08/2012   Metatarsalgia of both feet 09/21/2011   CERVICALGIA 05/04/2010   Asymptomatic varicose veins 03/17/2009   Osteoarthritis 03/17/2009   SCOLIOSIS, MILD 03/17/2009    Dennard Vezina C. Dua Mehler PT, DPT 04/06/21 10:34 AM   [facility] [place], [place], [address] Phone: [phone]   Fax:  [phone]  Name: CHARLAINE UTSEY MRN: [id number] Date of Birth: 04-08-1956

## 2021-04-08 ENCOUNTER — Ambulatory Visit: Payer: Medicare Other

## 2021-04-10 ENCOUNTER — Ambulatory Visit (HOSPITAL_BASED_OUTPATIENT_CLINIC_OR_DEPARTMENT_OTHER): Payer: Medicare Other | Admitting: Physical Therapy

## 2021-04-13 ENCOUNTER — Ambulatory Visit (HOSPITAL_BASED_OUTPATIENT_CLINIC_OR_DEPARTMENT_OTHER): Payer: Medicare Other | Admitting: Physical Therapy

## 2021-04-17 ENCOUNTER — Ambulatory Visit (HOSPITAL_BASED_OUTPATIENT_CLINIC_OR_DEPARTMENT_OTHER): Payer: Medicare Other | Admitting: Physical Therapy

## 2021-04-20 ENCOUNTER — Ambulatory Visit (HOSPITAL_BASED_OUTPATIENT_CLINIC_OR_DEPARTMENT_OTHER): Payer: Medicare Other | Admitting: Physical Therapy

## 2021-04-22 ENCOUNTER — Inpatient Hospital Stay: Payer: Medicare Other | Attending: Oncology

## 2021-04-22 ENCOUNTER — Other Ambulatory Visit: Payer: Self-pay

## 2021-04-22 ENCOUNTER — Inpatient Hospital Stay: Payer: Medicare Other

## 2021-04-22 VITALS — BP 133/83 | HR 63 | Temp 98.7°F

## 2021-04-22 DIAGNOSIS — Z170 Estrogen receptor positive status [ER+]: Secondary | ICD-10-CM | POA: Diagnosis not present

## 2021-04-22 DIAGNOSIS — Z96643 Presence of artificial hip joint, bilateral: Secondary | ICD-10-CM

## 2021-04-22 DIAGNOSIS — M160 Bilateral primary osteoarthritis of hip: Secondary | ICD-10-CM

## 2021-04-22 DIAGNOSIS — C50112 Malignant neoplasm of central portion of left female breast: Secondary | ICD-10-CM

## 2021-04-22 DIAGNOSIS — C7951 Secondary malignant neoplasm of bone: Secondary | ICD-10-CM

## 2021-04-22 DIAGNOSIS — H40039 Anatomical narrow angle, unspecified eye: Secondary | ICD-10-CM

## 2021-04-22 DIAGNOSIS — I7300 Raynaud's syndrome without gangrene: Secondary | ICD-10-CM

## 2021-04-22 DIAGNOSIS — Q6589 Other specified congenital deformities of hip: Secondary | ICD-10-CM

## 2021-04-22 LAB — CBC WITH DIFFERENTIAL/PLATELET
Abs Immature Granulocytes: 0.02 10*3/uL (ref 0.00–0.07)
Basophils Absolute: 0 10*3/uL (ref 0.0–0.1)
Basophils Relative: 0 %
Eosinophils Absolute: 0.1 10*3/uL (ref 0.0–0.5)
Eosinophils Relative: 3 %
HCT: 28.8 % — ABNORMAL LOW (ref 36.0–46.0)
Hemoglobin: 10.3 g/dL — ABNORMAL LOW (ref 12.0–15.0)
Immature Granulocytes: 1 %
Lymphocytes Relative: 41 %
Lymphs Abs: 1.7 10*3/uL (ref 0.7–4.0)
MCH: 34.4 pg — ABNORMAL HIGH (ref 26.0–34.0)
MCHC: 35.8 g/dL (ref 30.0–36.0)
MCV: 96.3 fL (ref 80.0–100.0)
Monocytes Absolute: 0.3 10*3/uL (ref 0.1–1.0)
Monocytes Relative: 7 %
Neutro Abs: 2 10*3/uL (ref 1.7–7.7)
Neutrophils Relative %: 48 %
Platelets: 225 10*3/uL (ref 150–400)
RBC: 2.99 MIL/uL — ABNORMAL LOW (ref 3.87–5.11)
RDW: 13 % (ref 11.5–15.5)
WBC: 4.1 10*3/uL (ref 4.0–10.5)
nRBC: 0 % (ref 0.0–0.2)

## 2021-04-22 LAB — COMPREHENSIVE METABOLIC PANEL
ALT: 19 U/L (ref 0–44)
AST: 29 U/L (ref 15–41)
Albumin: 3.5 g/dL (ref 3.5–5.0)
Alkaline Phosphatase: 57 U/L (ref 38–126)
Anion gap: 9 (ref 5–15)
BUN: 15 mg/dL (ref 8–23)
CO2: 24 mmol/L (ref 22–32)
Calcium: 9.7 mg/dL (ref 8.9–10.3)
Chloride: 99 mmol/L (ref 98–111)
Creatinine, Ser: 0.8 mg/dL (ref 0.44–1.00)
GFR, Estimated: >60 mL/min (ref >60)
Glucose, Bld: 94 mg/dL (ref 70–99)
Potassium: 4.6 mmol/L (ref 3.5–5.1)
Sodium: 132 mmol/L — ABNORMAL LOW (ref 135–145)
Total Bilirubin: 0.3 mg/dL (ref 0.3–1.2)
Total Protein: 6.5 g/dL (ref 6.5–8.1)

## 2021-04-22 LAB — CEA (IN HOUSE-CHCC): CEA (CHCC-In House): 7.8 ng/mL — ABNORMAL HIGH (ref 0.00–5.00)

## 2021-04-22 MED ORDER — DENOSUMAB 120 MG/1.7ML ~~LOC~~ SOLN
120.0000 mg | Freq: Once | SUBCUTANEOUS | Status: AC
  Administered 2021-04-22: 120 mg via SUBCUTANEOUS

## 2021-04-22 MED ORDER — DENOSUMAB 120 MG/1.7ML ~~LOC~~ SOLN
SUBCUTANEOUS | Status: AC
  Filled 2021-04-22: qty 1.7

## 2021-04-23 ENCOUNTER — Other Ambulatory Visit: Payer: Self-pay | Admitting: Oncology

## 2021-04-23 ENCOUNTER — Telehealth: Payer: Self-pay | Admitting: Oncology

## 2021-04-23 ENCOUNTER — Ambulatory Visit (HOSPITAL_BASED_OUTPATIENT_CLINIC_OR_DEPARTMENT_OTHER): Payer: Medicare Other | Admitting: Physical Therapy

## 2021-04-23 ENCOUNTER — Encounter (HOSPITAL_BASED_OUTPATIENT_CLINIC_OR_DEPARTMENT_OTHER): Payer: Self-pay | Admitting: Physical Therapy

## 2021-04-23 ENCOUNTER — Encounter: Payer: Self-pay | Admitting: Oncology

## 2021-04-23 DIAGNOSIS — M25552 Pain in left hip: Secondary | ICD-10-CM

## 2021-04-23 DIAGNOSIS — Z170 Estrogen receptor positive status [ER+]: Secondary | ICD-10-CM

## 2021-04-23 DIAGNOSIS — M545 Low back pain, unspecified: Secondary | ICD-10-CM | POA: Diagnosis not present

## 2021-04-23 DIAGNOSIS — G8929 Other chronic pain: Secondary | ICD-10-CM

## 2021-04-23 DIAGNOSIS — R2689 Other abnormalities of gait and mobility: Secondary | ICD-10-CM

## 2021-04-23 DIAGNOSIS — M6281 Muscle weakness (generalized): Secondary | ICD-10-CM

## 2021-04-23 DIAGNOSIS — M5442 Lumbago with sciatica, left side: Secondary | ICD-10-CM

## 2021-04-23 DIAGNOSIS — C50112 Malignant neoplasm of central portion of left female breast: Secondary | ICD-10-CM

## 2021-04-23 LAB — CANCER ANTIGEN 27.29: CA 27.29: 31.6 U/mL (ref 0.0–38.6)

## 2021-04-23 MED ORDER — CAPECITABINE 500 MG PO TABS: ORAL_TABLET | ORAL | 6 refills | Status: DC

## 2021-04-23 NOTE — Therapy (Signed)
[facility] [place], [place], [address] Phone: [phone]   Fax:  [phone]  Physical Therapy Treatment  Patient Details  Name: Brittany [name] MRN: [id number] Date of Birth: [birth year] Referring Provider (PT): Lura Em, MD   Encounter Date: 04/23/2021   PT End of Session - 04/23/21 0934     Visit Number 8    Number of Visits 17    Date for PT Re-Evaluation 05/29/21    Authorization Type MCR/BCBS    Progress Note Due on Visit 17    PT Start Time 0933    PT Stop Time 1012    PT Time Calculation (min) 39 min    Activity Tolerance Patient tolerated treatment well    Behavior During Therapy [facility] for tasks assessed/performed             Past Medical History:  Diagnosis Date   ANEMIA-NOS    Asymptomatic varicose veins    Bone metastases ([place]) 2021   Breast cancer ([place]) 1996   LEFT BREAST CA   BREAST CANCER, HX OF 01/1995 dx   s/p Lumpectomy, XRT, chemo and 49yrarmidex   Gestational diabetes mellitus in childbirth, diet controlled 1985   Hypothyroid 09/28/2015   Dx 09/2015   Leukopenia    mild since chemo   OSTEOARTHRITIS    Personal history of chemotherapy    Personal history of radiation therapy    SCOLIOSIS, MILD     Past Surgical History:  Procedure Laterality Date   BREAST BIOPSY     BREAST LUMPECTOMY Left    1996   left lumpectomy  [place]    [place] & [place] [place] [facility]   There were no vitals filed for this visit.   Subjective Assessment - 04/23/21 0934     Subjective I am tired after travel. Low back has a nagging pain but it bearable. I noticed I am slow when I am walking. I am doing my chemo meds every other day, infusions are an injection for bone density. By the end of the day while holding the baby it was worse, I also had to use stairs at my daughters house. we are doing a 50 mile bike trip in 2 weeks and I have not been biking  much lately.    Patient Stated Goals get back to being active. knowledge of progression of workouts.    Currently in Pain? Yes    Pain Score 3     Pain Location Back    Pain Orientation Lower    Pain Descriptors / Indicators Nagging                [facility] PT Assessment - 04/23/21 0001       ROM / Strength   AROM / PROM / Strength Strength      Strength   Overall Strength Comments incr scapular winging with all UE MMT                           [facility] Adult PT Treatment/Exercise - 04/23/21 0001       Lumbar Exercises: Stretches   Passive Hamstring Stretch Limitations seated 30s ea    Piriformis Stretch Limitations seated 30s ea    Gastroc Stretch Limitations standing lunge 30sea      Lumbar Exercises: Aerobic   Stationary Bike upright bike- posture discussed  Lumbar Exercises: Standing   Other Standing Lumbar Exercises hip hinge to shoulder flexion palms down; shoulder flexion palms in; biceps curls from fwd diagonal; bent over triceps kicks all with 2lb    Other Standing Lumbar Exercises lunge-hip ext                      PT Short Term Goals - 04/06/21 0935       PT SHORT TERM GOAL #1   Title pt will tolerate short duration of swinging golf club to indicate improved core control    Status Achieved      PT SHORT TERM GOAL #2   Title 5TSTS to 12s    Baseline 9s    Status Achieved               PT Long Term Goals - 04/06/21 0936       PT LONG TERM GOAL #1   Title pt will perform lateral motions in small plyometric form to return to tennis play and PLOF    Baseline improved tolerance with notable balance difficulties in landing & direction changes    Status On-going      PT LONG TERM GOAL #2   Title pt will verbalize understanding for progression of body weight exercises    Baseline met at this point but requires further progresion to meet goals.    Status Partially Met      PT LONG TERM GOAL #3   Title will verbalize  physical preparedness to attend sons wedding in August    Status Achieved      PT LONG TERM GOAL #4   Title Lumbar ROM improved to [facility] with minimal amount of pain    Status Achieved      PT LONG TERM GOAL #5   Title able to demo LLE control in CKC by reducing femoral IR    Baseline most notable in Lt, slight on Rt    Target Date 05/29/21                   Plan - 04/23/21 0958     Clinical Impression Statement Progressed gross strengthening exercises with a focus on reduction of thoracic kyphosis which is placing excessive demand on lumbar spine. Is planning to do a 50 mile bike ride in 2 weeks so we used upright bike in gym to discuss form and adjustments.    PT Treatment/Interventions ADLs/Self Care Home Management;Cryotherapy;Aquatic Therapy;Moist Heat;Gait training;Functional mobility training;Stair training;Neuromuscular re-education;Balance training;Therapeutic exercise;Therapeutic activities;Patient/family education;Manual techniques;Passive range of motion;Taping    PT Next Visit Plan stairs    PT Home Exercise Plan Access Code: [email or web address] (pool)    Consulted and Agree with Plan of Care Patient             Patient will benefit from skilled therapeutic intervention in order to improve the following deficits and impairments:  Difficulty walking, Decreased balance, Increased muscle spasms, Improper body mechanics, Decreased activity tolerance, Decreased strength, Postural dysfunction, Pain  Visit Diagnosis: Chronic bilateral low back pain without sciatica  Muscle weakness (generalized)  Other abnormalities of gait and mobility  Pain in left hip  Acute left-sided low back pain with left-sided sciatica     Problem List Patient Active Problem List   Diagnosis Date Noted   Left shoulder pain 02/17/2021   Lumbar radiculopathy 10/07/2020   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   Malignant neoplasm of central portion of left breast in female, estrogen  receptor positive ([place])  11/22/2019   Bone metastases ([place]) 11/22/2019   Anatomical narrow angle glaucoma 09/25/2018   History of gestational diabetes 09/21/2017   Raynaud phenomenon 09/21/2016   History of total replacement of both hip joints 01/12/2016   Left rotator cuff tear 10/07/2015   Hypothyroid 09/28/2015   Leukopenia 12/08/2012   Metatarsalgia of both feet 09/21/2011   CERVICALGIA 05/04/2010   Asymptomatic varicose veins 03/17/2009   Osteoarthritis 03/17/2009   SCOLIOSIS, MILD 03/17/2009    Layia Walla C. Daiquan Resnik PT, DPT 04/23/21 11:28 AM   [facility] [place], [place], [address] Phone: [phone]   Fax:  [phone]  Name: Brittany [name] MRN: [id number] Date of Birth: 11-13-1955

## 2021-04-23 NOTE — Progress Notes (Signed)
CEA has gone up a bit.  She is taking capecitabine 1000 mg every other day.  We are going to try to go to 1000 mg Monday through Friday and skip weekends and see if we get a better result.

## 2021-04-23 NOTE — Telephone Encounter (Signed)
Scheduled appt per 6/30 sch msg. Pt aware.  

## 2021-04-28 ENCOUNTER — Ambulatory Visit (HOSPITAL_BASED_OUTPATIENT_CLINIC_OR_DEPARTMENT_OTHER): Payer: Medicare Other | Attending: Critical Care Medicine | Admitting: Physical Therapy

## 2021-04-28 ENCOUNTER — Other Ambulatory Visit: Payer: Self-pay | Admitting: Oncology

## 2021-04-28 ENCOUNTER — Other Ambulatory Visit: Payer: Self-pay

## 2021-04-28 DIAGNOSIS — R2689 Other abnormalities of gait and mobility: Secondary | ICD-10-CM | POA: Diagnosis present

## 2021-04-28 DIAGNOSIS — M545 Low back pain, unspecified: Secondary | ICD-10-CM | POA: Diagnosis present

## 2021-04-28 DIAGNOSIS — G8929 Other chronic pain: Secondary | ICD-10-CM

## 2021-04-28 DIAGNOSIS — M25552 Pain in left hip: Secondary | ICD-10-CM

## 2021-04-28 DIAGNOSIS — M6281 Muscle weakness (generalized): Secondary | ICD-10-CM

## 2021-04-28 DIAGNOSIS — M5442 Lumbago with sciatica, left side: Secondary | ICD-10-CM | POA: Diagnosis present

## 2021-04-28 NOTE — Therapy (Signed)
[place], [place], [address] Phone: [phone]   Fax:  [phone]  Physical Therapy Treatment  Patient Details  Name: Brittany [name] MRN: [id number] Date of Birth: 1956-08-17 Referring Provider (PT): Lura Em, MD   Encounter Date: 04/28/2021   PT End of Session - 04/28/21 0932     Visit Number 9    Number of Visits 17    Date for PT Re-Evaluation 05/29/21    Authorization Type MCR/BCBS    Progress Note Due on Visit 17    PT Start Time 0932    PT Stop Time 1010    PT Time Calculation (min) 38 min    Activity Tolerance Patient tolerated treatment well    Behavior During Therapy [facility] for tasks assessed/performed             Past Medical History:  Diagnosis Date   ANEMIA-NOS    Asymptomatic varicose veins    Bone metastases ([place]) 2021   Breast cancer ([place]) 1996   LEFT BREAST CA   BREAST CANCER, HX OF 01/1995 dx   s/p Lumpectomy, XRT, chemo and 67yrarmidex   Gestational diabetes mellitus in childbirth, diet controlled 1985   Hypothyroid 09/28/2015   Dx 09/2015   Leukopenia    mild since chemo   OSTEOARTHRITIS    Personal history of chemotherapy    Personal history of radiation therapy    SCOLIOSIS, MILD     Past Surgical History:  Procedure Laterality Date   BREAST BIOPSY     BREAST LUMPECTOMY Left    1996   left lumpectomy  1[place]    2002 & 2003-Dr. vail @ [facility]   There were no vitals filed for this visit.   Subjective Assessment - 04/28/21 0933     Subjective I did a 6 mile bike ride of thur and was fine; got to 10 on Sat and fell off of bike. I then had to push my bike with a flat tire up a hill for 2 miles. the lunges are really hard.    Patient Stated Goals get back to being active. knowledge of progression of workouts.    Currently in Pain? No/denies                               [facility]  [facility] - 04/28/21 0001       Therapeutic Activites    Therapeutic Activities ADL's    ADL's stairs      Lumbar Exercises: Machines for Strengthening   Other Lumbar Machine Exercise shuttle- sidelying single leg press, plie supine      Lumbar Exercises: Standing   Other Standing Lumbar Exercises fwd step downs with mirror    Other Standing Lumbar Exercises lunge press                    PT Education - 04/28/21 1005     Education Details pilates sidelying, improving endurance, altering exercises    Person(s) Educated Patient    Methods Explanation;Demonstration;Tactile cues;Verbal cues    Comprehension Verbalized understanding;Need further instruction;Returned demonstration;Verbal cues required;Tactile cues required              PT Short Term Goals - 04/06/21 0935       PT SHORT TERM GOAL #1   Title pt will tolerate short duration  of swinging golf club to indicate improved core control    Status Achieved      PT SHORT TERM GOAL #2   Title 5TSTS to 12s    Baseline 9s    Status Achieved               PT Long Term Goals - 04/06/21 0936       PT LONG TERM GOAL #1   Title pt will perform lateral motions in small plyometric form to return to tennis play and PLOF    Baseline improved tolerance with notable balance difficulties in landing & direction changes    Status On-going      PT LONG TERM GOAL #2   Title pt will verbalize understanding for progression of body weight exercises    Baseline met at this point but requires further progresion to meet goals.    Status Partially Met      PT LONG TERM GOAL #3   Title will verbalize physical preparedness to attend sons wedding in August    Status Achieved      PT LONG TERM GOAL #4   Title Lumbar ROM improved to [facility] with minimal amount of pain    Status Achieved      PT LONG TERM GOAL #5   Title able to demo LLE control in CKC by reducing femoral IR    Baseline most notable in Lt, slight  on Rt    Target Date 05/29/21                   Plan - 04/28/21 1148     Clinical Impression Statement Worked on form when doing steps- ascending and descending to decrease valgus positioning. She reports she does pilates once/week but uses the tower mostly- discussed how to use reformer similar to shuttle to continue strengthening hip abd group. she was discouraged that she is not back to PLOF so we discussed working to improve endurance to return over time.    PT Treatment/Interventions ADLs/Self Care Home Management;Cryotherapy;Aquatic Therapy;Moist Heat;Gait training;Functional mobility training;Stair training;Neuromuscular re-education;Balance training;Therapeutic exercise;Therapeutic activities;Patient/family education;Manual techniques;Passive range of motion;Taping    PT Next Visit Plan did she do another bike ride? continue shuttle challenges, sumo squats, core    PT Home Exercise Plan Access Code: [email or web address] (pool)    Consulted and Agree with Plan of Care Patient             Patient will benefit from skilled therapeutic intervention in order to improve the following deficits and impairments:  Difficulty walking, Decreased balance, Increased muscle spasms, Improper body mechanics, Decreased activity tolerance, Decreased strength, Postural dysfunction, Pain  Visit Diagnosis: Muscle weakness (generalized)  Chronic bilateral low back pain without sciatica  Other abnormalities of gait and mobility  Pain in left hip  Acute left-sided low back pain with left-sided sciatica     Problem List Patient Active Problem List   Diagnosis Date Noted   Left shoulder pain 02/17/2021   Lumbar radiculopathy 10/07/2020   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   Malignant neoplasm of central portion of left breast in female, estrogen receptor positive ([place]) 11/22/2019   Bone metastases ([place]) 11/22/2019   Anatomical narrow angle glaucoma 09/25/2018   History of gestational  diabetes 09/21/2017   Raynaud phenomenon 09/21/2016   History of total replacement of both hip joints 01/12/2016   Left rotator cuff tear 10/07/2015   Hypothyroid 09/28/2015   Leukopenia 12/08/2012   Metatarsalgia of both feet 09/21/2011   CERVICALGIA  05/04/2010   Asymptomatic varicose veins 03/17/2009   Osteoarthritis 03/17/2009   SCOLIOSIS, MILD 03/17/2009    C.  PT, DPT 04/28/21 11:51 AM   [facility] [address] [place], [place], [address] Phone: [phone]   Fax:  [phone]  Name: Brittany [name] MRN: [id number] Date of Birth: 02/24/1956    

## 2021-05-01 ENCOUNTER — Encounter (HOSPITAL_BASED_OUTPATIENT_CLINIC_OR_DEPARTMENT_OTHER): Payer: Self-pay | Admitting: Physical Therapy

## 2021-05-01 ENCOUNTER — Other Ambulatory Visit: Payer: Self-pay

## 2021-05-01 ENCOUNTER — Ambulatory Visit (HOSPITAL_BASED_OUTPATIENT_CLINIC_OR_DEPARTMENT_OTHER): Payer: Medicare Other | Admitting: Physical Therapy

## 2021-05-01 DIAGNOSIS — R2689 Other abnormalities of gait and mobility: Secondary | ICD-10-CM

## 2021-05-01 DIAGNOSIS — G8929 Other chronic pain: Secondary | ICD-10-CM

## 2021-05-01 DIAGNOSIS — M6281 Muscle weakness (generalized): Secondary | ICD-10-CM | POA: Diagnosis not present

## 2021-05-01 DIAGNOSIS — M545 Low back pain, unspecified: Secondary | ICD-10-CM

## 2021-05-01 NOTE — Therapy (Signed)
[facility] [address] [place], [place], [address] Phone: [phone]   Fax:  [phone]  Physical Therapy Treatment  Patient Details  Name: Brittany [name] MRN: [id number] Date of Birth: 12/05/1955 Referring Provider (PT): Lura Em, MD   Encounter Date: 05/01/2021   PT End of Session - 05/01/21 1009     Visit Number 10    Number of Visits 17    Date for PT Re-Evaluation 05/29/21    Authorization Type MCR/BCBS    Progress Note Due on Visit 17    PT Start Time 0930    PT Stop Time 1009    PT Time Calculation (min) 39 min    Activity Tolerance Patient tolerated treatment well    Behavior During Therapy [facility] for tasks assessed/performed             Past Medical History:  Diagnosis Date   ANEMIA-NOS    Asymptomatic varicose veins    Bone metastases ([place]) 2021   Breast cancer ([place]) 1996   LEFT BREAST CA   BREAST CANCER, HX OF 01/1995 dx   s/p Lumpectomy, XRT, chemo and 49yrarmidex   Gestational diabetes mellitus in childbirth, diet controlled 1985   Hypothyroid 09/28/2015   Dx 09/2015   Leukopenia    mild since chemo   OSTEOARTHRITIS    Personal history of chemotherapy    Personal history of radiation therapy    SCOLIOSIS, MILD     Past Surgical History:  Procedure Laterality Date   BREAST BIOPSY     BREAST LUMPECTOMY Left    1996   left lumpectomy  1[place]    2002 & 2003-Dr. vail @ [facility]   There were no vitals filed for this visit.   Subjective Assessment - 05/01/21 0934     Subjective I did about 5 miles and rested then did 8.5 miles.    Patient Stated Goals get back to being active. knowledge of progression of workouts.    Currently in Pain? No/denies                               [facility] - 05/01/21 0001       Lumbar Exercises: Aerobic   Stationary Bike upright bike 3 min end of session       Lumbar Exercises: Standing   Other Standing Lumbar Exercises stairs: lat step up, lat step down tap, runner step up, step up hinge      Lumbar Exercises: Sidelying   Other Sidelying Lumbar Exercises SL press on shuttle, 2x25, lateral plyopress 1x25                      PT Short Term Goals - 04/06/21 0935       PT SHORT TERM GOAL #1   Title pt will tolerate short duration of swinging golf club to indicate improved core control    Status Achieved      PT SHORT TERM GOAL #2   Title 5TSTS to 12s    Baseline 9s    Status Achieved               PT Long Term Goals - 04/06/21 0936       PT LONG TERM GOAL #1   Title pt will perform lateral motions in small plyometric form to return to  tennis play and PLOF    Baseline improved tolerance with notable balance difficulties in landing & direction changes    Status On-going      PT LONG TERM GOAL #2   Title pt will verbalize understanding for progression of body weight exercises    Baseline met at this point but requires further progresion to meet goals.    Status Partially Met      PT LONG TERM GOAL #3   Title will verbalize physical preparedness to attend sons wedding in August    Status Achieved      PT LONG TERM GOAL #4   Title Lumbar ROM improved to [facility] with minimal amount of pain    Status Achieved      PT LONG TERM GOAL #5   Title able to demo LLE control in CKC by reducing femoral IR    Baseline most notable in Lt, slight on Rt    Target Date 05/29/21                   Plan - 05/01/21 1310     Clinical Impression Statement exercises added to HEP specifially to address stairs and strength in that motion. reviewed pilates reformer exercises and she will talk to her instructor about doing these. VC to engage abdominal wall on bike at the end of the session when she was fatigued.    PT Treatment/Interventions ADLs/Self Care Home Management;Cryotherapy;Aquatic Therapy;Moist Heat;Gait  training;Functional mobility training;Stair training;Neuromuscular re-education;Balance training;Therapeutic exercise;Therapeutic activities;Patient/family education;Manual techniques;Passive range of motion;Taping    PT Next Visit Plan sumo squats    PT Home Exercise Plan Access Code: [email or web address] (pool)    Consulted and Agree with Plan of Care Patient             Patient will benefit from skilled therapeutic intervention in order to improve the following deficits and impairments:  Difficulty walking, Decreased balance, Increased muscle spasms, Improper body mechanics, Decreased activity tolerance, Decreased strength, Postural dysfunction, Pain  Visit Diagnosis: Muscle weakness (generalized)  Chronic bilateral low back pain without sciatica  Other abnormalities of gait and mobility     Problem List Patient Active Problem List   Diagnosis Date Noted   Left shoulder pain 02/17/2021   Lumbar radiculopathy 10/07/2020   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   Malignant neoplasm of central portion of left breast in female, estrogen receptor positive ([place]) 11/22/2019   Bone metastases ([place]) 11/22/2019   Anatomical narrow angle glaucoma 09/25/2018   History of gestational diabetes 09/21/2017   Raynaud phenomenon 09/21/2016   History of total replacement of both hip joints 01/12/2016   Left rotator cuff tear 10/07/2015   Hypothyroid 09/28/2015   Leukopenia 12/08/2012   Metatarsalgia of both feet 09/21/2011   CERVICALGIA 05/04/2010   Asymptomatic varicose veins 03/17/2009   Osteoarthritis 03/17/2009   SCOLIOSIS, MILD 03/17/2009    Diana Davenport C. Yani Coventry PT, DPT 05/01/21 1:13 PM   [place], [place], [address] Phone: [phone]   Fax:  [phone]  Name: Brittany [name] MRN: [id number] Date of Birth: 05-Apr-1956

## 2021-05-04 ENCOUNTER — Encounter: Payer: Self-pay | Admitting: Oncology

## 2021-05-04 ENCOUNTER — Other Ambulatory Visit: Payer: Self-pay

## 2021-05-04 ENCOUNTER — Other Ambulatory Visit: Payer: Self-pay | Admitting: Oncology

## 2021-05-04 DIAGNOSIS — Z170 Estrogen receptor positive status [ER+]: Secondary | ICD-10-CM

## 2021-05-04 DIAGNOSIS — C50112 Malignant neoplasm of central portion of left female breast: Secondary | ICD-10-CM

## 2021-05-04 MED ORDER — CAPECITABINE 500 MG PO TABS: ORAL_TABLET | ORAL | 6 refills | Status: DC

## 2021-05-05 ENCOUNTER — Encounter (HOSPITAL_BASED_OUTPATIENT_CLINIC_OR_DEPARTMENT_OTHER): Payer: Self-pay | Admitting: Physical Therapy

## 2021-05-05 ENCOUNTER — Ambulatory Visit (HOSPITAL_BASED_OUTPATIENT_CLINIC_OR_DEPARTMENT_OTHER): Payer: Medicare Other | Admitting: Physical Therapy

## 2021-05-05 ENCOUNTER — Ambulatory Visit (INDEPENDENT_AMBULATORY_CARE_PROVIDER_SITE_OTHER): Payer: Medicare Other

## 2021-05-05 VITALS — BP 130/80 | HR 61 | Temp 98.1°F | Ht 67.0 in | Wt 143.0 lb

## 2021-05-05 DIAGNOSIS — G8929 Other chronic pain: Secondary | ICD-10-CM

## 2021-05-05 DIAGNOSIS — M6281 Muscle weakness (generalized): Secondary | ICD-10-CM

## 2021-05-05 DIAGNOSIS — R2689 Other abnormalities of gait and mobility: Secondary | ICD-10-CM

## 2021-05-05 DIAGNOSIS — Z0000 Encounter for general adult medical examination without abnormal findings: Secondary | ICD-10-CM

## 2021-05-05 DIAGNOSIS — M545 Low back pain, unspecified: Secondary | ICD-10-CM

## 2021-05-05 NOTE — Patient Instructions (Addendum)
Ms. Brittany [name] , Thank you for taking time to come for your Medicare Wellness Visit. I appreciate your ongoing commitment to your health goals. Please review the following plan we discussed and let me know if I can assist you in the future.   Screening recommendations/referrals: Colonoscopy: 12/09/2014; due every 10 years (due 12/09/2024) Mammogram: 11/28/2020; due every 1-2 years (due 11/28/2021) Bone Density: 11/20/2018; due every 5 years (due 11/21/2023) Recommended yearly ophthalmology/optometry visit for glaucoma screening and checkup Recommended yearly dental visit for hygiene and checkup  Vaccinations: Influenza vaccine: 07/23/2020 Pneumococcal vaccine: 09/22/2015, 09/28/2019 Tdap vaccine: 09/16/2014; due every 10 years (covers whooping cough) Shingles vaccine: 12/29/2017, 03/01/2018   Covid-19: 11/14/2019, 12/05/2019, 06/22/2020, 02/06/2021  Advanced directives: Please bring a copy of your health care power of attorney and living will to the office at your convenience.  Conditions/risks identified: Yes. Client understands the importance of follow-up with providers by attending scheduled visits and discussed goals to eat healthier, increase physical activity, exercise the brain, socialize more,  get enough rest and make time for laughter.  Next appointment: Please schedule your next Medicare Wellness Visit with your Nurse Health Advisor in 1 year by calling [phone].   Preventive Care 67 Years and Older, Female Preventive care refers to lifestyle choices and visits with your health care provider that can promote health and wellness. What does preventive care include? A yearly physical exam. This is also called an annual well check. Dental exams once or twice a year. Routine eye exams. Ask your health care provider how often you should have your eyes checked. Personal lifestyle choices, including: Daily care of your teeth and gums. Regular physical activity. Eating a healthy diet. Avoiding  tobacco and drug use. Limiting alcohol use. Practicing safe sex. Taking low-dose aspirin every day. Taking vitamin and mineral supplements as recommended by your health care provider. What happens during an annual well check? The services and screenings done by your health care provider during your annual well check will depend on your age, overall health, lifestyle risk factors, and family history of disease. Counseling  Your health care provider may ask you questions about your: Alcohol use. Tobacco use. Drug use. Emotional well-being. Home and relationship well-being. Sexual activity. Eating habits. History of falls. Memory and ability to understand (cognition). Work and work Statistician. Reproductive health. Screening  You may have the following tests or measurements: Height, weight, and BMI. Blood pressure. Lipid and cholesterol levels. These may be checked every 5 years, or more frequently if you are over 67 years old. Skin check. Lung cancer screening. You may have this screening every year starting at age 67 if you have a 30-pack-year history of smoking and currently smoke or have quit within the past 15 years. Fecal occult blood test (FOBT) of the stool. You may have this test every year starting at age 67. Flexible sigmoidoscopy or colonoscopy. You may have a sigmoidoscopy every 5 years or a colonoscopy every 10 years starting at age 67. Hepatitis C blood test. Hepatitis B blood test. Sexually transmitted disease (STD) testing. Diabetes screening. This is done by checking your blood sugar (glucose) after you have not eaten for a while (fasting). You may have this done every 1-3 years. Bone density scan. This is done to screen for osteoporosis. You may have this done starting at age 67. Mammogram. This may be done every 1-2 years. Talk to your health care provider about how often you should have regular mammograms. Talk with your health care provider about your  test  results, treatment options, and if necessary, the need for more tests. Vaccines  Your health care provider may recommend certain vaccines, such as: Influenza vaccine. This is recommended every year. Tetanus, diphtheria, and acellular pertussis (Tdap, Td) vaccine. You may need a Td booster every 10 years. Zoster vaccine. You may need this after age 67. Pneumococcal 13-valent conjugate (PCV13) vaccine. One dose is recommended after age 67. Pneumococcal polysaccharide (PPSV23) vaccine. One dose is recommended after age 67. Talk to your health care provider about which screenings and vaccines you need and how often you need them. This information is not intended to replace advice given to you by your health care provider. Make sure you discuss any questions you have with your health care provider. Document Released: 11/07/2015 Document Revised: 06/30/2016 Document Reviewed: 08/12/2015 Elsevier Interactive Patient Education  2017 [place] Prevention in the Home Falls can cause injuries. They can happen to people of all ages. There are many things you can do to make your home safe and to help prevent falls. What can I do on the outside of my home? Regularly fix the edges of walkways and driveways and fix any cracks. Remove anything that might make you trip as you walk through a door, such as a raised step or threshold. Trim any bushes or trees on the path to your home. Use bright outdoor lighting. Clear any walking paths of anything that might make someone trip, such as rocks or tools. Regularly check to see if handrails are loose or broken. Make sure that both sides of any steps have handrails. Any raised decks and porches should have guardrails on the edges. Have any leaves, snow, or ice cleared regularly. Use sand or salt on walking paths during winter. Clean up any spills in your garage right away. This includes oil or grease spills. What can I do in the bathroom? Use night  lights. Install grab bars by the toilet and in the tub and shower. Do not use towel bars as grab bars. Use non-skid mats or decals in the tub or shower. If you need to sit down in the shower, use a plastic, non-slip stool. Keep the floor dry. Clean up any water that spills on the floor as soon as it happens. Remove soap buildup in the tub or shower regularly. Attach bath mats securely with double-sided non-slip rug tape. Do not have throw rugs and other things on the floor that can make you trip. What can I do in the bedroom? Use night lights. Make sure that you have a light by your bed that is easy to reach. Do not use any sheets or blankets that are too big for your bed. They should not hang down onto the floor. Have a firm chair that has side arms. You can use this for support while you get dressed. Do not have throw rugs and other things on the floor that can make you trip. What can I do in the kitchen? Clean up any spills right away. Avoid walking on wet floors. Keep items that you use a lot in easy-to-reach places. If you need to reach something above you, use a strong step stool that has a grab bar. Keep electrical cords out of the way. Do not use floor polish or wax that makes floors slippery. If you must use wax, use non-skid floor wax. Do not have throw rugs and other things on the floor that can make you trip. What can I do with my  stairs? Do not leave any items on the stairs. Make sure that there are handrails on both sides of the stairs and use them. Fix handrails that are broken or loose. Make sure that handrails are as long as the stairways. Check any carpeting to make sure that it is firmly attached to the stairs. Fix any carpet that is loose or worn. Avoid having throw rugs at the top or bottom of the stairs. If you do have throw rugs, attach them to the floor with carpet tape. Make sure that you have a light switch at the top of the stairs and the bottom of the stairs. If  you do not have them, ask someone to add them for you. What else can I do to help prevent falls? Wear shoes that: Do not have high heels. Have rubber bottoms. Are comfortable and fit you well. Are closed at the toe. Do not wear sandals. If you use a stepladder: Make sure that it is fully opened. Do not climb a closed stepladder. Make sure that both sides of the stepladder are locked into place. Ask someone to hold it for you, if possible. Clearly mark and make sure that you can see: Any grab bars or handrails. First and last steps. Where the edge of each step is. Use tools that help you move around (mobility aids) if they are needed. These include: Canes. Walkers. Scooters. Crutches. Turn on the lights when you go into a dark area. Replace any light bulbs as soon as they burn out. Set up your furniture so you have a clear path. Avoid moving your furniture around. If any of your floors are uneven, fix them. If there are any pets around you, be aware of where they are. Review your medicines with your doctor. Some medicines can make you feel dizzy. This can increase your chance of falling. Ask your doctor what other things that you can do to help prevent falls. This information is not intended to replace advice given to you by your health care provider. Make sure you discuss any questions you have with your health care provider. Document Released: 08/07/2009 Document Revised: 03/18/2016 Document Reviewed: 11/15/2014 Elsevier Interactive Patient Education  2017 [employer].

## 2021-05-05 NOTE — Progress Notes (Signed)
Subjective:   Brittany [name] is a 65 y.o. female who presents for an Initial Medicare Annual Wellness Visit.  Review of Systems     Cardiac Risk Factors include: advanced age (>73mn, >>62women);family history of premature cardiovascular disease     Objective:    Today's Vitals   05/05/21 1324  BP: 130/80  Pulse: 61  Temp: 98.1 F (36.7 C)  SpO2: 92%  Weight: 143 lb (64.9 kg)  Height: '5\' 7"'  (1.702 m)  PainSc: 0-No pain   Body mass index is 22.4 kg/m.  Advanced Directives 05/05/2021 09/13/2016 11/04/2015  Does Patient Have a Medical Advance Directive? Yes Yes Yes  Type of [place]amed[place]of [place] will [place] will [place] State[place] will  Does patient want to make changes to medical advance directive? No - Patient declined - -  Copy of HE[place] Chart? No - copy requested - No - copy requested    Current Medications (verified) Outpatient Encounter Medications as of 05/05/2021  Medication Sig   acetaminophen (TYLENOL) 500 MG tablet Take 1 tablet (500 mg total) by mouth in the morning, at noon, and at bedtime. Take with aleve 220 mg   Calcium Citrate 250 MG TABS Take 4 tablets (1,000 mg total) by mouth daily.   capecitabine (XELODA) 500 MG tablet Take 2 tablets (1000 mg) daily Monday through Friday, skip Saturdays and Sundays.   Cholecalciferol (VITAMIN D3) 1000 UNITS CAPS Take by mouth daily.   Denosumab (XGEVA [place]) Inject into the skin.   diclofenac sodium (VOLTAREN) 1 % GEL Apply 2 g topically 3 (three) times daily as needed.   gabapentin (NEURONTIN) 300 MG capsule Take 1 capsule (300 mg total) by mouth 3 (three) times daily.   levothyroxine (SYNTHROID) 50 MCG tablet Take 1 tablet (50 mcg total) by mouth daily.   Multiple Vitamin (MULTIVITAMIN) tablet Take 1 tablet by mouth daily.   naproxen sodium (ALEVE) 220 MG tablet Take 1 tablet (220 mg total) by mouth in the morning, at  noon, and at bedtime. Take with tylenol 500 mg   Omega-3 Fatty Acids (FISH OIL) 1000 MG CPDR Take by mouth daily.   baclofen (LIORESAL) 20 MG tablet Take 1 tablet (20 mg total) by mouth 3 (three) times daily as needed for muscle spasms. (Patient not taking: Reported on 05/05/2021)   traMADol (ULTRAM) 50 MG tablet Take 1-2 tablets (50-100 mg total) by mouth every 6 (six) hours as needed. (Patient not taking: Reported on 05/05/2021)   No facility-administered encounter medications on file as of 05/05/2021.    Allergies (verified) Patient has no known allergies.   History: Past Medical History:  Diagnosis Date   ANEMIA-NOS    Asymptomatic varicose veins    Bone metastases ([place]   Breast [place]   LEFT BREAST CA   BREAST CANCER, HX OF 01/1995 dx   s/p Lumpectomy, XRT, chemo and 566yrrmidex   Gestational diabetes mellitus in childbirth, diet controlled 1985   Hypothyroid 09/28/2015   Dx 09/2015   Leukopenia    mild since chemo   OSTEOARTHRITIS    Personal history of chemotherapy    Personal history of radiation therapy    SCOLIOSIS, MILD    Past Surgical History:  Procedure Laterality Date   BREAST BIOPSY     BREAST LUMPECTOMY Left    1996   left lumpectomy  1996   TONSILLECTOMY     TOTAL HIP ARTHROPLASTY  2002 & 2003-Dr. vail @ [place]   Family History  Problem Relation Age of Onset   Heart disease Father 67       AMI   Hyperlipidemia Father    Valvular heart disease Father 21   Parkinson's disease Father 46   Arthritis Mother    Breast cancer Mother 26   Hyperlipidemia Mother    Osteoporosis Mother    Pancreatic cancer Maternal Grandmother 40       presumed dx   Diabetes Paternal Grandfather        type 2   Breast cancer Maternal Aunt    Social History   Socioeconomic History   Marital status: Married    Spouse name: Not on file   Number of children: Not on file   Years of education: Not on file   Highest education level: Not on file   Occupational History   Not on file  Tobacco Use   Smoking status: Former    Pack years: 0.00    Types: Cigarettes    Quit date: 06/25/1977    Years since quitting: 43.8   Smokeless tobacco: Never  Substance and Sexual Activity   Alcohol use: Yes    Alcohol/week: 3.0 - 4.0 standard drinks    Types: 3 - 4 Glasses of wine per week   Drug use: No   Sexual activity: Not on file  Other Topics Concern   Not on file  Social History Narrative   Married-lives with spouse & son. Art gallery manager, enjoys golf, sports, bridge, swim, and walking dog   Social Determinants of Health   Financial Resource Strain: Low Risk    Difficulty of Paying Living Expenses: Not hard at all  Food Insecurity: No Food Insecurity   Worried About Charity fundraiser in the Last Year: Never true   Arboriculturist in the Last Year: Never true  Transportation Needs: No Transportation Needs   Lack of Transportation (Medical): No   Lack of Transportation (Non-Medical): No  Physical Activity: Sufficiently Active   Days of Exercise per Week: 7 days   Minutes of Exercise per Session: 60 min  Stress: No Stress Concern Present   Feeling of Stress : Not at all  Social Connections: Socially Integrated   Frequency of Communication with Friends and Family: Three times a week   Frequency of Social Gatherings with Friends and Family: Three times a week   Attends Religious Services: More than 4 times per year   Active Member of Clubs or Organizations: Yes   Attends Music therapist: More than 4 times per year   Marital Status: Married    Tobacco Counseling Counseling given: Not Answered   Clinical Intake:  Pre-visit preparation completed: Yes  Pain : No/denies pain Pain Score: 0-No pain     BMI - recorded: 22.4 Nutritional Status: BMI of 19-24  Normal Nutritional Risks: None Diabetes: No  How often do you need to have someone help you when you read instructions, pamphlets, or other written  materials from your doctor or pharmacy?: 1 - Never What is the last grade level you completed in school?: MBA Degree  Diabetic? no  Interpreter Needed?: No  Information entered by :: Lisette Abu, LPN   Activities of Daily Living In your present state of health, do you have any difficulty performing the following activities: 05/05/2021  Hearing? N  Vision? N  Difficulty concentrating or making decisions? N  Walking or climbing stairs? N  Dressing or  bathing? N  Doing errands, shopping? N  Preparing Food and eating ? N  Using the Toilet? N  In the past six months, have you accidently leaked urine? N  Do you have problems with loss of bowel control? N  Managing your Medications? N  Managing your Finances? N  Housekeeping or managing your Housekeeping? N  Some recent data might be hidden    Patient Care Team: Binnie Rail, MD as PCP - General (Internal Medicine) Aloha Gell, MD as Consulting Physician (Obstetrics and Gynecology) Sydnee Levans, MD (Dermatology) Magrinat, Virgie Dad, MD as Consulting Physician (Oncology) Richmond Campbell, MD as Consulting Physician (Gastroenterology) Stefanie Libel, MD as Consulting Physician (Sports Medicine) Eunice Blase, MD (Family Medicine) Erline Levine, MD as Consulting Physician (Neurosurgery) Hetty Blend Alfredo Bach, MD as Referring Physician (Orthopedic Surgery) Debbra Riding, MD as Consulting Physician (Ophthalmology)  Indicate any recent Medical Services you may have received from other than [facility] providers in the past year (date may be approximate).     Assessment:   This is a routine wellness examination for [place].  Hearing/Vision screen Hearing Screening - Comments:: Patient denied any hearing difficulty. Vision Screening - Comments:: Patient wears glasses.  Eye exams done once a year by Dr. Wyatt Portela.  Dietary issues and exercise activities discussed: Current Exercise Habits: Home exercise routine,  Type of exercise: walking;Other - see comments (water aerobics, bike riding for 10 miles, physical therapy exercises, stationary bike), Time (Minutes): 60, Frequency (Times/Week): 7, Weekly Exercise (Minutes/Week): 420, Intensity: Moderate   Goals Addressed               This Visit's Progress     Patient Stated (pt-stated)        My goal is to increase my bike riding miles.       Depression Screen PHQ 2/9 Scores 05/05/2021 10/07/2020 09/28/2019 09/21/2017 09/13/2016  PHQ - 2 Score 0 0 0 0 0    Fall Risk Fall Risk  05/05/2021 10/07/2020 09/28/2019 09/21/2017 09/13/2016  Falls in the past year? 0 0 0 Yes No  Number falls in past yr: 0 0 0 1 -  Injury with Fall? 0 0 - - -  Risk for fall due to : No Fall Risks No Fall Risks - - -  Follow up Falls evaluation completed Falls evaluation completed - - -    FALL RISK PREVENTION PERTAINING TO THE HOME:  Any stairs in or around the home? Yes  If so, are there any without handrails? No  Home free of loose throw rugs in walkways, pet beds, electrical cords, etc? Yes  Adequate lighting in your home to reduce risk of falls? Yes   ASSISTIVE DEVICES UTILIZED TO PREVENT FALLS:  Life alert? No  Use of a cane, walker or w/c? No  Grab bars in the bathroom? No  Shower chair or bench in shower? Yes  Elevated toilet seat or a handicapped toilet? Yes   TIMED UP AND GO:  Was the test performed? Yes .  Length of time to ambulate 10 feet: 6 sec.   Gait steady and fast without use of assistive device  Cognitive Function: Normal cognitive status assessed by direct observation by this Nurse Health Advisor. No abnormalities found.          Immunizations Immunization History  Administered Date(s) Administered   Fluad Quad(high Dose 65+) 07/09/2019   Hepatitis A 04/24/2004, 12/21/2004   Hepatitis B 12/21/2004, 01/24/2006, 06/14/2008   Influenza Split 06/26/2011   Influenza, High  Dose Seasonal PF 07/23/2020   Influenza,inj,Quad PF,6+ Mos  09/14/2013, 07/18/2017, 07/03/2018   Influenza-Unspecified 06/25/2014, 07/07/2015, 07/19/2016   MMR 08/31/2019   PFIZER(Purple Top)SARS-COV-2 Vaccination 11/14/2019, 12/05/2019, 06/22/2020, 02/06/2021   Pneumococcal Conjugate-13 09/22/2015   Pneumococcal Polysaccharide-23 09/28/2019   Td 04/24/2004   Tdap 09/08/2012, 09/16/2014   Zoster Recombinat (Shingrix) 12/29/2017, 03/01/2018   Zoster, Live 09/16/2014    TDAP status: Up to date  Flu Vaccine status: Up to date  Pneumococcal vaccine status: Up to date  Covid-19 vaccine status: Completed vaccines  Qualifies for Shingles Vaccine? Yes   Zostavax completed Yes   Shingrix Completed?: Yes  Screening Tests Health Maintenance  Topic Date Due   INFLUENZA VACCINE  05/25/2021   COVID-19 Vaccine (5 - Booster for Pfizer series) 06/08/2021   MAMMOGRAM  11/28/2022   DEXA SCAN  11/21/2023   TETANUS/TDAP  09/16/2024   COLONOSCOPY (Pts 45-34yr Insurance coverage will need to be confirmed)  12/09/2024   Hepatitis C Screening  Completed   PNA vac Low Risk Adult  Completed   Zoster Vaccines- Shingrix  Completed   HPV VACCINES  Aged Out    Health Maintenance  There are no preventive care reminders to display for this patient.  Colorectal cancer screening: Type of screening: Colonoscopy. Completed 12/09/2014. Repeat every 10 years  Mammogram status: Completed 11/28/2020. Repeat every year  Bone Density status: Completed 11/20/2018. Results reflect: Bone density results: NORMAL. Repeat every 5 years.  Lung Cancer Screening: (Low Dose CT Chest recommended if Age 67-80years, 30 pack-year currently smoking OR have quit w/in 15years.) does not qualify.   Lung Cancer Screening Referral: no  Additional Screening:  Hepatitis C Screening: does not qualify; Completed no  Vision Screening: Recommended annual ophthalmology exams for early detection of glaucoma and other disorders of the eye. Is the patient up to date with their annual eye  exam?  Yes  Who is the provider or what is the name of the office in which the patient attends annual eye exams? Richard SWyatt Portela MD. If pt is not established with a provider, would they like to be referred to a provider to establish care? No .   Dental Screening: Recommended annual dental exams for proper oral hygiene  Community Resource Referral / Chronic Care Management: CRR required this visit?  No   CCM required this visit?  No      Plan:     I have personally reviewed and noted the following in the patient's chart:   Medical and social history Use of alcohol, tobacco or illicit drugs  Current medications and supplements including opioid prescriptions. Patient is not currently taking opioid prescriptions. Functional ability and status Nutritional status Physical activity Advanced directives List of other physicians Hospitalizations, surgeries, and ER visits in previous 12 months Vitals Screenings to include cognitive, depression, and falls Referrals and appointments  In addition, I have reviewed and discussed with patient certain preventive protocols, quality metrics, and best practice recommendations. A written personalized care plan for preventive services as well as general preventive health recommendations were provided to patient.     SSheral Flow LPN   72/80/0349  Nurse Notes: n/a

## 2021-05-05 NOTE — Therapy (Signed)
[place], [place], [address] Phone: [phone]   Fax:  [phone]  Physical Therapy Treatment  Patient Details  Name: Brittany [name] MRN: [id number] Date of Birth: 04-Jan-1956 Referring Provider (PT): Lura Em, MD   Encounter Date: 05/05/2021   PT End of Session - 05/05/21 0935     Visit Number 11    Number of Visits 17    Date for PT Re-Evaluation 05/29/21    Authorization Type MCR/BCBS    Progress Note Due on Visit 17    PT Start Time 0932    PT Stop Time 1010    PT Time Calculation (min) 38 min    Activity Tolerance Patient tolerated treatment well    Behavior During Therapy [facility] for tasks assessed/performed             Past Medical History:  Diagnosis Date   ANEMIA-NOS    Asymptomatic varicose veins    Bone metastases ([place]) 2021   Breast cancer ([place]) 1996   LEFT BREAST CA   BREAST CANCER, HX OF 01/1995 dx   s/p Lumpectomy, XRT, chemo and 56yrarmidex   Gestational diabetes mellitus in childbirth, diet controlled 1985   Hypothyroid 09/28/2015   Dx 09/2015   Leukopenia    mild since chemo   OSTEOARTHRITIS    Personal history of chemotherapy    Personal history of radiation therapy    SCOLIOSIS, MILD     Past Surgical History:  Procedure Laterality Date   BREAST BIOPSY     BREAST LUMPECTOMY Left    1996   left lumpectomy  [facility]    2002 & 2003-[facility]. vail [facility] [place]   There were no vitals filed for this visit.   Subjective Assessment - 05/05/21 0933     Subjective Have not done any more rides since last one. Lt glut just won't get worked out. found an area of my stairs to do my exercises    Patient Stated Goals get back to being active. knowledge of progression of workouts.                               [facility]worth[facility]ult PT Treatment/Exercise - 05/05/21 0001       Lumbar Exercises: Aerobic   Stationary Bike  upright bike 8 min end of session      Lumbar Exercises: Standing   Other Standing Lumbar Exercises lateral step overs    Other Standing Lumbar Exercises sumo squats, single leg mini squat+opp hip flexion      Manual Therapy   Manual Therapy Soft tissue mobilization    Soft tissue mobilization Lt piriformis- STM & stretching                      PT Short Term Goals - 04/06/21 0935       PT SHORT TERM GOAL #1   Title pt will tolerate short duration of swinging golf club to indicate improved core control    Status Achieved      PT SHORT TERM GOAL #2   Title 5TSTS to 12s    Baseline 9s    Status Achieved               PT Long Term Goals - 04/06/21 0936       PT LONG TERM GOAL #1  Title pt will perform lateral motions in small plyometric form to return to tennis play and PLOF    Baseline improved tolerance with notable balance difficulties in landing & direction changes    Status On-going      PT LONG TERM GOAL #2   Title pt will verbalize understanding for progression of body weight exercises    Baseline met at this point but requires further progresion to meet goals.    Status Partially Met      PT LONG TERM GOAL #3   Title will verbalize physical preparedness to attend sons wedding in August    Status Achieved      PT LONG TERM GOAL #4   Title Lumbar ROM improved to [facility] with minimal amount of pain    Status Achieved      PT LONG TERM GOAL #5   Title able to demo LLE control in [facility] by reducing femoral IR    Baseline most notable in Lt, slight on Rt    Target Date 05/29/21                   Plan - 05/05/21 1011     Clinical Impression Statement difficulty creating LE ER in sumo squat so did not add to HEP quite yet. ended with bike ride with cues to avoid valgus when fatigued. tightness in piriformis reduced with STM and was able to walk without discomfort in hip.    PT Treatment/Interventions ADLs/Self Care Home  Management;Cryotherapy;Aquatic Therapy;Moist Heat;Gait training;Functional mobility training;Stair training;Neuromuscular re-education;Balance training;Therapeutic exercise;Therapeutic activities;Patient/family education;Manual techniques;Passive range of motion;Taping    PT Next Visit Plan pool: suo squats, plyometrics    PT Home Exercise Plan Access Code: [email or web address] (pool)    Consulted and Agree with Plan of Care Patient             Patient will benefit from skilled therapeutic intervention in order to improve the following deficits and impairments:  Difficulty walking, Decreased balance, Increased muscle spasms, Improper body mechanics, Decreased activity tolerance, Decreased strength, Postural dysfunction, Pain  Visit Diagnosis: Muscle weakness (generalized)  Chronic bilateral low back pain without sciatica  Other abnormalities of gait and mobility     Problem List Patient Active Problem List   Diagnosis Date Noted   Left shoulder pain 02/17/2021   Lumbar radiculopathy 10/07/2020   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   Malignant neoplasm of central portion of left breast in female, estrogen receptor positive ([place]) 11/22/2019   Bone metastases ([place]) 11/22/2019   Anatomical narrow angle glaucoma 09/25/2018   History of gestational diabetes 09/21/2017   Raynaud phenomenon 09/21/2016   History of total replacement of both hip joints 01/12/2016   Left rotator cuff tear 10/07/2015   Hypothyroid 09/28/2015   Leukopenia 12/08/2012   Metatarsalgia of both feet 09/21/2011   CERVICALGIA 05/04/2010   Asymptomatic varicose veins 03/17/2009   Osteoarthritis 03/17/2009   SCOLIOSIS, MILD 03/17/2009    Brittany [name] C. Brittany [name] PT, DPT 05/05/21 10:14 AM  Brittany [name] [place], [place], [address] Phone: [phone]   Fax:  [phone]  Name: Brittany [name] MRN: [id number] Date of Birth: Jun 23, 1956

## 2021-05-07 ENCOUNTER — Ambulatory Visit (HOSPITAL_BASED_OUTPATIENT_CLINIC_OR_DEPARTMENT_OTHER): Payer: Medicare Other | Admitting: Physical Therapy

## 2021-05-11 ENCOUNTER — Ambulatory Visit (HOSPITAL_BASED_OUTPATIENT_CLINIC_OR_DEPARTMENT_OTHER): Payer: Medicare Other | Admitting: Physical Therapy

## 2021-05-13 ENCOUNTER — Ambulatory Visit (HOSPITAL_BASED_OUTPATIENT_CLINIC_OR_DEPARTMENT_OTHER): Payer: Medicare Other | Admitting: Physical Therapy

## 2021-05-13 ENCOUNTER — Encounter (HOSPITAL_BASED_OUTPATIENT_CLINIC_OR_DEPARTMENT_OTHER): Payer: Self-pay | Admitting: Physical Therapy

## 2021-05-13 ENCOUNTER — Other Ambulatory Visit: Payer: Self-pay

## 2021-05-13 DIAGNOSIS — M545 Low back pain, unspecified: Secondary | ICD-10-CM

## 2021-05-13 DIAGNOSIS — R2689 Other abnormalities of gait and mobility: Secondary | ICD-10-CM

## 2021-05-13 DIAGNOSIS — M25552 Pain in left hip: Secondary | ICD-10-CM

## 2021-05-13 DIAGNOSIS — M5442 Lumbago with sciatica, left side: Secondary | ICD-10-CM

## 2021-05-13 DIAGNOSIS — M6281 Muscle weakness (generalized): Secondary | ICD-10-CM | POA: Diagnosis not present

## 2021-05-13 DIAGNOSIS — G8929 Other chronic pain: Secondary | ICD-10-CM

## 2021-05-13 NOTE — Therapy (Signed)
[name], [place], [address] Phone: [phone]   Fax:  [phone]  Physical Therapy Treatment  Patient Details  Name: Brittany [name] MRN: [id number] Date of Birth: 1956-08-27 Referring Provider (PT): Lura Em, MD   Encounter Date: 05/13/2021   PT End of Session - 05/13/21 1338     Visit Number 12    Number of Visits 17    Date for PT Re-Evaluation 05/29/21    PT Start Time 0902    PT Stop Time 0945    PT Time Calculation (min) 43 min    Equipment Utilized During Treatment Other (comment)   noodle and ankle foam cuffs   Activity Tolerance Patient tolerated treatment well    Behavior During Therapy [facility] for tasks assessed/performed             Past Medical History:  Diagnosis Date   ANEMIA-NOS    Asymptomatic varicose veins    Bone metastases ([place]) 2021   Breast cancer ([place]) 1996   LEFT BREAST CA   BREAST CANCER, HX OF 01/1995 dx   s/p Lumpectomy, XRT, chemo and 10yrarmidex   Gestational diabetes mellitus in childbirth, diet controlled 1985   Hypothyroid 09/28/2015   Dx 09/2015   Leukopenia    mild since chemo   OSTEOARTHRITIS    Personal history of chemotherapy    Personal history of radiation therapy    SCOLIOSIS, MILD     Past Surgical History:  Procedure Laterality Date   BREAST BIOPSY     BREAST LUMPECTOMY Left    1996   left lumpectomy  1[place]    2002 & 2003-Dr. vail @ [facility]   There were no vitals filed for this visit.   Subjective Assessment - 05/13/21 1336     Subjective Lft piriformis is a little soar today.  Sitting on the ball helps.    Pain Score 0-No pain                                       PT Education - 05/13/21 1337     Education Details improving endurance in aquatic setting              PT Short Term Goals - 04/06/21 0935       PT SHORT TERM GOAL #1   Title pt  will tolerate short duration of swinging golf club to indicate improved core control    Status Achieved      PT SHORT TERM GOAL #2   Title 5TSTS to 12s    Baseline 9s    Status Achieved               PT Long Term Goals - 04/06/21 0936       PT LONG TERM GOAL #1   Title pt will perform lateral motions in small plyometric form to return to tennis play and PLOF    Baseline improved tolerance with notable balance difficulties in landing & direction changes    Status On-going      PT LONG TERM GOAL #2   Title pt will verbalize understanding for progression of body weight exercises    Baseline met at this point but requires further progresion to meet goals.    Status Partially Met      PT LONG  TERM GOAL #3   Title will verbalize physical preparedness to attend sons wedding in August    Status Achieved      PT LONG TERM GOAL #4   Title Lumbar ROM improved to [facility] with minimal amount of pain    Status Achieved      PT LONG TERM GOAL #5   Title able to demo LLE control in CKC by reducing femoral IR    Baseline most notable in Lt, slight on Rt    Target Date 05/29/21           Pt seen for aquatic therapy today.  Treatment took place in water 3.25-4.8 ft in depth at the [employer] pool. Temp of water was 91.  Pt entered/exited the pool via stairs step to pattern independently with bilat rail. Warm up: forward, backward and side stepping/walking cues for increased step length, increased speed, hand placement to increase resistance.  Seated Stretching: gastroc, hamstrings, glutes, IR/ER, piriformis and LB on wall and sitting on bench of pool.  Standing Noodle kick down 3 x 10 reps supported by hand buoys cueing for abdominal and glut contraction. Hip flex/abdominal and quad stretching facing wall using noodle 3 x 20 sec. Noodle pull through 2 x 15 rep bilaterally cuing for increased speed. Hip abd/add 2 x 10;cuing for core tightness Hip circles 2 x 10  Suspended  vertically Straddling noodle interval training x 10 min 20 quick revolutions (bicycle) every minute for aerobic capacity/endurance training.  Pt requires buoyancy for support and to offload joints with strengthening exercises. Viscosity of the water is needed for resistance of strengthening; water current perturbations provides challenge to standing balance unsupported, requiring increased core activation.         Plan - 05/13/21 1339     Clinical Impression Statement Intriduced pt to aerobic capacity techniques in aquatic setting.  Pt has access to a pool.  Able to decrease left prirformis tightness with resisted hip extension, ( using ankle buoy and noodle), and stretching.  She demonstrates knowledge and indep with using noodle to facilitate exercises at home as well as bicyclicing on noodle interval training to build endurance, Le/hip strength.    Personal Factors and Comorbidities Comorbidity 3+    PT Treatment/Interventions ADLs/Self Care Home Management;Cryotherapy;Aquatic Therapy;Moist Heat;Gait training;Functional mobility training;Stair training;Neuromuscular re-education;Balance training;Therapeutic exercise;Therapeutic activities;Patient/family education;Manual techniques;Passive range of motion;Taping             Patient will benefit from skilled therapeutic intervention in order to improve the following deficits and impairments:  Difficulty walking, Decreased balance, Increased muscle spasms, Improper body mechanics, Decreased activity tolerance, Decreased strength, Postural dysfunction, Pain  Visit Diagnosis: Muscle weakness (generalized)  Chronic bilateral low back pain without sciatica  Other abnormalities of gait and mobility  Pain in left hip  Acute left-sided low back pain with left-sided sciatica     Problem List Patient Active Problem List   Diagnosis Date Noted   Left shoulder pain 02/17/2021   Lumbar radiculopathy 10/07/2020   Congenital hip  dysplasia 11/22/2019   Malignant neoplasm of central portion of left breast in female, estrogen receptor positive ([place]) 11/22/2019   Bone metastases ([place]) 11/22/2019   Anatomical narrow angle glaucoma 09/25/2018   History of gestational diabetes 09/21/2017   Raynaud phenomenon 09/21/2016   History of total replacement of both hip joints 01/12/2016   Left rotator cuff tear 10/07/2015   Hypothyroid 09/28/2015   Leukopenia 12/08/2012   Metatarsalgia of both feet 09/21/2011   CERVICALGIA 05/04/2010  Asymptomatic varicose veins 03/17/2009   Osteoarthritis 03/17/2009   SCOLIOSIS, MILD 03/17/2009    Vedia Pereyra  MPT 05/13/2021, 1:55 PM  [facility] [address] [place], [place], [address] Phone: [phone]   Fax:  [phone]  Name: TONNIE STILLMAN MRN: [id number] Date of Birth: 02-26-1956

## 2021-05-18 ENCOUNTER — Other Ambulatory Visit: Payer: Self-pay

## 2021-05-18 ENCOUNTER — Encounter (HOSPITAL_BASED_OUTPATIENT_CLINIC_OR_DEPARTMENT_OTHER): Payer: Self-pay | Admitting: Physical Therapy

## 2021-05-18 ENCOUNTER — Ambulatory Visit (HOSPITAL_BASED_OUTPATIENT_CLINIC_OR_DEPARTMENT_OTHER): Payer: Medicare Other | Admitting: Physical Therapy

## 2021-05-18 DIAGNOSIS — M6281 Muscle weakness (generalized): Secondary | ICD-10-CM | POA: Diagnosis not present

## 2021-05-18 DIAGNOSIS — R2689 Other abnormalities of gait and mobility: Secondary | ICD-10-CM

## 2021-05-18 DIAGNOSIS — G8929 Other chronic pain: Secondary | ICD-10-CM

## 2021-05-18 NOTE — Therapy (Signed)
[place], [place], [address] Phone: [phone]   Fax:  [phone]  Physical Therapy Treatment  Patient Details  Name: Brittany [name] MRN: [id number] Date of Birth: 11/02/1955 Referring Provider (PT): Lura Em, MD   Encounter Date: 05/18/2021   PT End of Session - 05/18/21 1011     Visit Number 13    Number of Visits 17    Date for PT Re-Evaluation 05/29/21    Authorization Type MCR/BCBS    Progress Note Due on Visit 17    PT Start Time 0930    PT Stop Time 1010    PT Time Calculation (min) 40 min    Activity Tolerance Patient tolerated treatment well    Behavior During Therapy [facility] for tasks assessed/performed             Past Medical History:  Diagnosis Date   ANEMIA-NOS    Asymptomatic varicose veins    Bone metastases ([place]) 2021   Breast cancer ([place]) 1996   LEFT BREAST CA   BREAST CANCER, HX OF 01/1995 dx   s/p Lumpectomy, XRT, chemo and 67yrarmidex   Gestational diabetes mellitus in childbirth, diet controlled 1985   Hypothyroid 09/28/2015   Dx 09/2015   Leukopenia    mild since chemo   OSTEOARTHRITIS    Personal history of chemotherapy    Personal history of radiation therapy    SCOLIOSIS, MILD     Past Surgical History:  Procedure Laterality Date   BREAST BIOPSY     BREAST LUMPECTOMY Left    1996   left lumpectomy  1[place]    2002 & 2003-[place] [facility]   There were no vitals filed for this visit.   Subjective Assessment - 05/18/21 0931     Subjective Did a10 mile bike ride and felt fine after. I felt like balance was better on bike but it was a flat surface. Still have the glut issue when I walk.    Currently in Pain? No/denies                [facility] Assessment - 05/18/21 0001       Ambulation/Gait   Gait Comments +Lt trendelenburg              AquaticREHABdocumentation: Pt.requires the  viscosity of the water for resistance with strengthening exercises and Water current provides perturbations which challenge standing balance unsupported    aquatic exercises Side stepping +UE horiz abd+add Hip hike- on noodle, on step, in gait Piriformis stretch Lateral stepping squats- ER to parallel Standing hip circles 3lb each ankle- no UE support on wall or in water HSS on step L stretch from handrail Sumo squats                   PT Short Term Goals - 04/06/21 0935       PT SHORT TERM GOAL #1   Title pt will tolerate short duration of swinging golf club to indicate improved core control    Status Achieved      PT SHORT TERM GOAL #2   Title 5TSTS to 12s    Baseline 9s    Status Achieved               PT Long Term Goals - 04/06/21 0936       PT LONG TERM GOAL #1   Title  pt will perform lateral motions in small plyometric form to return to tennis play and PLOF    Baseline improved tolerance with notable balance difficulties in landing & direction changes    Status On-going      PT LONG TERM GOAL #2   Title pt will verbalize understanding for progression of body weight exercises    Baseline met at this point but requires further progresion to meet goals.    Status Partially Met      PT LONG TERM GOAL #3   Title will verbalize physical preparedness to attend sons wedding in August    Status Achieved      PT LONG TERM GOAL #4   Title Lumbar ROM improved to [facility] with minimal amount of pain    Status Achieved      PT LONG TERM GOAL #5   Title able to demo LLE control in CKC by reducing femoral IR    Baseline most notable in Lt, slight on Rt    Target Date 05/29/21                   Plan - 05/18/21 1001     Clinical Impression Statement Last complaint pt notes is the Lt buttock pain when she walks- pain with WB portion of gait with +Lt trendelenburg. Added balance components to SLS activities in pool for progression and safety which  was challenging. 3lb ankle weights added for fatigue as well.    PT Treatment/Interventions ADLs/Self Care Home Management;Cryotherapy;Aquatic Therapy;Moist Heat;Gait training;Functional mobility training;Stair training;Neuromuscular re-education;Balance training;Therapeutic exercise;Therapeutic activities;Patient/family education;Manual techniques;Passive range of motion;Taping    PT Next Visit Plan pool: suo squats, plyometrics    PT Home Exercise Plan Access Code: [email or web address] (pool)    Consulted and Agree with Plan of Care Patient             Patient will benefit from skilled therapeutic intervention in order to improve the following deficits and impairments:  Difficulty walking, Decreased balance, Increased muscle spasms, Improper body mechanics, Decreased activity tolerance, Decreased strength, Postural dysfunction, Pain  Visit Diagnosis: Muscle weakness (generalized)  Chronic bilateral low back pain without sciatica  Other abnormalities of gait and mobility     Problem List Patient Active Problem List   Diagnosis Date Noted   Left shoulder pain 02/17/2021   Lumbar radiculopathy 10/07/2020   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   Malignant neoplasm of central portion of left breast in female, estrogen receptor positive ([place]) 11/22/2019   Bone metastases ([place]) 11/22/2019   Anatomical narrow angle glaucoma 09/25/2018   History of gestational diabetes 09/21/2017   Raynaud phenomenon 09/21/2016   History of total replacement of both hip joints 01/12/2016   Left rotator cuff tear 10/07/2015   Hypothyroid 09/28/2015   Leukopenia 12/08/2012   Metatarsalgia of both feet 09/21/2011   CERVICALGIA 05/04/2010   Asymptomatic varicose veins 03/17/2009   Osteoarthritis 03/17/2009   SCOLIOSIS, MILD 03/17/2009   Lonney Revak C. Davelle Anselmi PT, DPT 05/18/21 5:12 PM  [facility] [place], [place], [address] Phone:  [phone]   Fax:  [phone]  Name: Brittany [name] MRN: [id number] Date of Birth: 1956-09-13

## 2021-05-25 ENCOUNTER — Other Ambulatory Visit: Payer: Self-pay

## 2021-05-25 ENCOUNTER — Encounter (HOSPITAL_BASED_OUTPATIENT_CLINIC_OR_DEPARTMENT_OTHER): Payer: Self-pay | Admitting: Physical Therapy

## 2021-05-25 ENCOUNTER — Ambulatory Visit (HOSPITAL_BASED_OUTPATIENT_CLINIC_OR_DEPARTMENT_OTHER): Payer: Medicare Other | Admitting: Physical Therapy

## 2021-05-25 ENCOUNTER — Inpatient Hospital Stay: Payer: Medicare Other | Attending: Oncology

## 2021-05-25 DIAGNOSIS — M5442 Lumbago with sciatica, left side: Secondary | ICD-10-CM | POA: Insufficient documentation

## 2021-05-25 DIAGNOSIS — M160 Bilateral primary osteoarthritis of hip: Secondary | ICD-10-CM

## 2021-05-25 DIAGNOSIS — G8929 Other chronic pain: Secondary | ICD-10-CM | POA: Insufficient documentation

## 2021-05-25 DIAGNOSIS — M25552 Pain in left hip: Secondary | ICD-10-CM | POA: Insufficient documentation

## 2021-05-25 DIAGNOSIS — M6281 Muscle weakness (generalized): Secondary | ICD-10-CM | POA: Insufficient documentation

## 2021-05-25 DIAGNOSIS — Z96643 Presence of artificial hip joint, bilateral: Secondary | ICD-10-CM

## 2021-05-25 DIAGNOSIS — Q6589 Other specified congenital deformities of hip: Secondary | ICD-10-CM

## 2021-05-25 DIAGNOSIS — C7951 Secondary malignant neoplasm of bone: Secondary | ICD-10-CM | POA: Insufficient documentation

## 2021-05-25 DIAGNOSIS — M545 Low back pain, unspecified: Secondary | ICD-10-CM | POA: Insufficient documentation

## 2021-05-25 DIAGNOSIS — C50912 Malignant neoplasm of unspecified site of left female breast: Secondary | ICD-10-CM | POA: Diagnosis present

## 2021-05-25 DIAGNOSIS — R2689 Other abnormalities of gait and mobility: Secondary | ICD-10-CM | POA: Insufficient documentation

## 2021-05-25 DIAGNOSIS — C50112 Malignant neoplasm of central portion of left female breast: Secondary | ICD-10-CM

## 2021-05-25 DIAGNOSIS — Z170 Estrogen receptor positive status [ER+]: Secondary | ICD-10-CM

## 2021-05-25 DIAGNOSIS — H40039 Anatomical narrow angle, unspecified eye: Secondary | ICD-10-CM

## 2021-05-25 DIAGNOSIS — I7300 Raynaud's syndrome without gangrene: Secondary | ICD-10-CM

## 2021-05-25 LAB — CBC WITH DIFFERENTIAL/PLATELET
Abs Immature Granulocytes: 0.03 10*3/uL (ref 0.00–0.07)
Basophils Absolute: 0 10*3/uL (ref 0.0–0.1)
Basophils Relative: 1 %
Eosinophils Absolute: 0.1 10*3/uL (ref 0.0–0.5)
Eosinophils Relative: 2 %
HCT: 29.2 % — ABNORMAL LOW (ref 36.0–46.0)
Hemoglobin: 10.3 g/dL — ABNORMAL LOW (ref 12.0–15.0)
Immature Granulocytes: 1 %
Lymphocytes Relative: 37 %
Lymphs Abs: 1.5 10*3/uL (ref 0.7–4.0)
MCH: 34.2 pg — ABNORMAL HIGH (ref 26.0–34.0)
MCHC: 35.3 g/dL (ref 30.0–36.0)
MCV: 97 fL (ref 80.0–100.0)
Monocytes Absolute: 0.3 10*3/uL (ref 0.1–1.0)
Monocytes Relative: 8 %
Neutro Abs: 2.2 10*3/uL (ref 1.7–7.7)
Neutrophils Relative %: 51 %
Platelets: 247 10*3/uL (ref 150–400)
RBC: 3.01 MIL/uL — ABNORMAL LOW (ref 3.87–5.11)
RDW: 15.6 % — ABNORMAL HIGH (ref 11.5–15.5)
WBC: 4.1 10*3/uL (ref 4.0–10.5)
nRBC: 0 % (ref 0.0–0.2)

## 2021-05-25 LAB — COMPREHENSIVE METABOLIC PANEL
ALT: 26 U/L (ref 0–44)
AST: 35 U/L (ref 15–41)
Albumin: 4 g/dL (ref 3.5–5.0)
Alkaline Phosphatase: 66 U/L (ref 38–126)
Anion gap: 7 (ref 5–15)
BUN: 12 mg/dL (ref 8–23)
CO2: 27 mmol/L (ref 22–32)
Calcium: 9.6 mg/dL (ref 8.9–10.3)
Chloride: 97 mmol/L — ABNORMAL LOW (ref 98–111)
Creatinine, Ser: 0.91 mg/dL (ref 0.44–1.00)
GFR, Estimated: >60 mL/min (ref >60)
Glucose, Bld: 111 mg/dL — ABNORMAL HIGH (ref 70–99)
Potassium: 4.8 mmol/L (ref 3.5–5.1)
Sodium: 131 mmol/L — ABNORMAL LOW (ref 135–145)
Total Bilirubin: 0.3 mg/dL (ref 0.3–1.2)
Total Protein: 7 g/dL (ref 6.5–8.1)

## 2021-05-25 LAB — CEA (IN HOUSE-CHCC): CEA (CHCC-In House): 7.99 ng/mL — ABNORMAL HIGH (ref 0.00–5.00)

## 2021-05-25 NOTE — Therapy (Signed)
[place] [address] [place], [place], [address] Phone: [phone]   Fax:  [phone]  Physical Therapy Treatment  Patient Details  Name: Brittany [name] MRN: [id number] Date of Birth: October 28, 1955 Referring Provider (PT): Lura Em, MD   Encounter Date: 05/25/2021   PT End of Session - 05/25/21 1015     Visit Number 14    Number of Visits 17    Date for PT Re-Evaluation 05/29/21    Authorization Type MCR/BCBS    PT Start Time 0957    PT Stop Time 1033    PT Time Calculation (min) 36 min    Equipment Utilized During Treatment Other (comment)   noodle and ankle floats   Activity Tolerance Patient tolerated treatment well    Behavior During Therapy [facility] for tasks assessed/performed             Past Medical History:  Diagnosis Date   ANEMIA-NOS    Asymptomatic varicose veins    Bone metastases ([place]) 2021   Breast cancer ([place]) 1996   LEFT BREAST CA   BREAST CANCER, HX OF 01/1995 dx   s/p Lumpectomy, XRT, chemo and 8yrarmidex   Gestational diabetes mellitus in childbirth, diet controlled 1985   Hypothyroid 09/28/2015   Dx 09/2015   Leukopenia    mild since chemo   OSTEOARTHRITIS    Personal history of chemotherapy    Personal history of radiation therapy    SCOLIOSIS, MILD     Past Surgical History:  Procedure Laterality Date   BREAST BIOPSY     BREAST LUMPECTOMY Left    1996   left lumpectomy  1[place]    2002 & 2003-Dr. vail @ [facility]   There were no vitals filed for this visit.   Subjective Assessment - 05/25/21 1002     Subjective Pt states she felt good after prior Rx.  Pt states she thinks she has been overdoing it.  Pt states she has glute weakness though when she works it out, she has issues with her piriformis.  Pt was careful playing golf and is able to play 9 holes without increased pain.  She is playing golf 1-2x/wk.  Pt denies any adverse  effects after prior Rx.  Pt used the TM yesterday for the 1st time and walked almost 2 miles.  Pt stopped walking due to L foot stumbling.  pt rode her bike for 8 miles on Saturday.  Pt states she is getting better and reports improved ambulation.  Pt reports improved core strength and tolerance to activity including biking and playing golf.    Pertinent History bilat THA, She does have a history of metastatic breast cancer.  She had a bone scan in September showing stable lesions throughout the spine.    Currently in Pain? Yes    Pain Score 3     Pain Location --   Central Low back and L post hip/glute.            Pt seen for aquatic therapy today.  Treatment took place in water 3.25-4.8 ft in depth [facility]. Temp of water was 92.  Pt entered/exited the pool via stairs step to pattern independently with bilat rail. Warm up: forward, backward and side stepping/walking cues for increased step length, increased speed, hand placement to increase resistance. -Pt performed standing gastroc stretching at wall and standing hamstrings stretch on step. -  Hip hike- on noodle, on step 2x10 each -Standing Noodle kick down 3 x 10 reps cueing for abdominal and glut contraction. -Standing Hip abd 2 x 10 with cuing for core tightness -Standing Hip circles 2 x 10 with 3# ankle weights  no UE support on wall or in water -Sumo Squats 3x10 -Suspended vertically holding onto 2 noodles:  interval training with 20 quick revolutions (bicycle) f/b a slower/more comfortable pace for aerobic capacity/endurance training.    Pt requires buoyancy for support and to offload joints with strengthening exercises. Viscosity of the water is needed for resistance of strengthening; water current perturbations provides challenge to standing balance unsupported, requiring increased core activation.        PT Education - 05/25/21 2330     Education Details answered pt's questions and educated pt  concerning sx's and activity.    Person(s) Educated Patient    Methods Explanation    Comprehension Verbalized understanding              PT Short Term Goals - 04/06/21 0935       PT SHORT TERM GOAL #1   Title pt will tolerate short duration of swinging golf club to indicate improved core control    Status Achieved      PT SHORT TERM GOAL #2   Title 5TSTS to 12s    Baseline 9s    Status Achieved               PT Long Term Goals - 04/06/21 0936       PT LONG TERM GOAL #1   Title pt will perform lateral motions in small plyometric form to return to tennis play and PLOF    Baseline improved tolerance with notable balance difficulties in landing & direction changes    Status On-going      PT LONG TERM GOAL #2   Title pt will verbalize understanding for progression of body weight exercises    Baseline met at this point but requires further progresion to meet goals.    Status Partially Met      PT LONG TERM GOAL #3   Title will verbalize physical preparedness to attend sons wedding in August    Status Achieved      PT LONG TERM GOAL #4   Title Lumbar ROM improved to [facility] with minimal amount of pain    Status Achieved      PT LONG TERM GOAL #5   Title able to demo LLE control in CKC by reducing femoral IR    Baseline most notable in Lt, slight on Rt    Target Date 05/29/21                   Plan - 05/25/21 2332     Clinical Impression Statement Pt is making progress as evidenced by her subjective reports including improved ambulation, core strength and tolerance to activity such as biking and playing golf.  Pt is highly motivated and gives great effort with all exercises.  Pt performed aquatic exercises well and was challenged with hip hike.  Pt was able to perform floating bicycle interval training well while holding onto 2 noodles.  Pt responded well to Rx reporting improved pain from 3/10 before Rx to 1/10 after Rx.  She should cont to benefit from cont  skilled PT services.    PT Next Visit Plan cont with aquatic therapy.    PT Home Exercise Plan Access Code: [email or web address] (pool)  Consulted and Agree with Plan of Care Patient             Patient will benefit from skilled therapeutic intervention in order to improve the following deficits and impairments:  Difficulty walking, Decreased balance, Increased muscle spasms, Improper body mechanics, Decreased activity tolerance, Decreased strength, Postural dysfunction, Pain  Visit Diagnosis: Muscle weakness (generalized)  Chronic bilateral low back pain without sciatica  Other abnormalities of gait and mobility     Problem List Patient Active Problem List   Diagnosis Date Noted   Left shoulder pain 02/17/2021   Lumbar radiculopathy 10/07/2020   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   Malignant neoplasm of central portion of left breast in female, estrogen receptor positive ([place]) 11/22/2019   Bone metastases ([place]) 11/22/2019   Anatomical narrow angle glaucoma 09/25/2018   History of gestational diabetes 09/21/2017   Raynaud phenomenon 09/21/2016   History of total replacement of both hip joints 01/12/2016   Left rotator cuff tear 10/07/2015   Hypothyroid 09/28/2015   Leukopenia 12/08/2012   Metatarsalgia of both feet 09/21/2011   CERVICALGIA 05/04/2010   Asymptomatic varicose veins 03/17/2009   Osteoarthritis 03/17/2009   SCOLIOSIS, MILD 03/17/2009    Selinda Michaels III PT, DPT 05/26/21 12:00 AM   [place], [place], [address] Phone: [phone]   Fax:  [phone]  Name: Brittany [name] MRN: [id number] Date of Birth: January 22, 1956

## 2021-05-26 LAB — CANCER ANTIGEN 27.29: CA 27.29: 37.4 U/mL (ref 0.0–38.6)

## 2021-05-27 ENCOUNTER — Other Ambulatory Visit: Payer: Self-pay

## 2021-05-27 ENCOUNTER — Ambulatory Visit (HOSPITAL_BASED_OUTPATIENT_CLINIC_OR_DEPARTMENT_OTHER): Payer: Medicare Other | Admitting: Physical Therapy

## 2021-05-27 ENCOUNTER — Encounter (HOSPITAL_BASED_OUTPATIENT_CLINIC_OR_DEPARTMENT_OTHER): Payer: Self-pay | Admitting: Physical Therapy

## 2021-05-27 DIAGNOSIS — M25552 Pain in left hip: Secondary | ICD-10-CM

## 2021-05-27 DIAGNOSIS — M6281 Muscle weakness (generalized): Secondary | ICD-10-CM

## 2021-05-27 DIAGNOSIS — C7951 Secondary malignant neoplasm of bone: Secondary | ICD-10-CM | POA: Diagnosis not present

## 2021-05-27 DIAGNOSIS — G8929 Other chronic pain: Secondary | ICD-10-CM

## 2021-05-27 DIAGNOSIS — M5442 Lumbago with sciatica, left side: Secondary | ICD-10-CM

## 2021-05-27 DIAGNOSIS — R2689 Other abnormalities of gait and mobility: Secondary | ICD-10-CM

## 2021-05-27 NOTE — Progress Notes (Addendum)
Brittany [name]  [phone] Fax:(336) [phone]     ID: Brittany [name] DOB: 1956-07-07  MR#: [id number]  LNL#:[id number]  Patient Care Team: Binnie Rail, MD as PCP - General (Internal Medicine) Aloha Gell, MD as Consulting Physician (Obstetrics and Gynecology) Sydnee Levans, MD (Dermatology) Daveena Elmore, Virgie Dad, MD as Consulting Physician (Oncology) Richmond Campbell, MD as Consulting Physician (Gastroenterology) Stefanie Libel, MD as Consulting Physician (Sports Medicine) Eunice Blase, MD (Family Medicine) Erline Levine, MD as Consulting Physician (Neurosurgery) Hetty Blend, Alfredo Bach, MD as Referring Physician (Orthopedic Surgery) Debbra Riding, MD as Consulting Physician (Ophthalmology) Raina Mina, RPH-CPP (Pharmacist) Chauncey Cruel, MD OTHER MD:   CHIEF COMPLAINT: Stage IV lobular breast cancer  CURRENT TREATMENT: capecitabine at metronomic doses   INTERVAL HISTORY: Brittany [name] returns today for follow up of her stage IV lobular breast cancer.   She has been on and off capecitabine since 02/25/2020 at metronomic doses due to palmar plantar erythrodysesthesia.  Currently she takes 2 tablets daily on Monday through Friday and then she is off for the weekends.  She has no diarrhea or mouth sores and her palmar plantar erythrodysesthesia is improved.  She began [place] on 12/13/2019.  Her most recent dose was 04/22/2021.  She is due for dose today.  Her most recent bone scan 01/13/2021 suggested a mixed response to treatment.  We are following her tumor markers, particularly her CEA, which has risen recently Lab Results  Component Value Date   CA2729 37.4 05/25/2021   CA2729 31.6 04/22/2021   CA2729 33.2 03/11/2021   CA2729 31.2 02/09/2021   CA2729 37.4 01/15/2021   Lab Results  Component Value Date   CEA1 7.99 (H) 05/25/2021   CEA1 7.80 (H) 04/22/2021   CEA1 5.94 (H) 03/11/2021   CEA1 5.13 (H) 02/09/2021   CEA1 5.06 (H)  01/15/2021    REVIEW OF SYSTEMS: Brittany [name], aqua [name] alternating with "dry".  She has a good functional status and recently was able to play 9 holes of golf.  She took a bike trip with her husband from [place] to [place].  She is planning to go hiking in the mountains in a couple of weekends.  She is looking forward to the new grandchild and is planning to visit dialysis in October.  A detailed review of systems was otherwise none contributory   COVID 19 VACCINATION STATUS: Status post Pfizer x2 with booster August 2021   HISTORY OF CURRENT ILLNESS: From the original intake note:  "Brittany" [name] has a history of left breast cancer dating back to 9.  She was followed by Dr. Beryle Beams and underwent lumpectomy, adjuvant chemotherapy, radiation and "hormonal [name]".  Per Brittany's recollection the tumor was less than 2 cm, 2 out of 10 lymph nodes were involved, and it was a lobular breast cancer.  She took tamoxifen for many years and then raloxifene.  Dr. Beryle Beams also evaluated her for leukopenia which was noted at first in 2011, worked up with a negative ANA and negative rheumatoid factors.  He felt it was a benign residual from her earlier chemotherapy.  She was released from follow-up in 2015.  She has continued on intensified screening because of her family history.  On November 23, 2017 she had bilateral breast MRIs which were unremarkable.  Mammography November 20, 2018 showed no findings suspicious of malignancy.  Breast MRI 10/30/2019 showed a new irregular 0.4 cm enhancing focus in the upper inner quadrant of the right breast.  The left breast continued to be unremarkable except for postoperative changes and there were no abnormal appearing lymph nodes.  However in that same MRI multiple areas of enhancement were noted in the sternum.  This had not been seen in the prior MRI.  Plan right MRI biopsy 11/16/2019 was canceled as the previously demonstrated  0.4 cm focus in the right breast showed only a tortuous vein at that location.  However bone scan 11/19/2019 obtained to follow-up on the sternal findings showed additional areas of concern at T12, T7-T11, L4 and ribs, as well as the sternum.  The patient's subsequent history is as detailed below.   PAST MEDICAL HISTORY: Past Medical History:  Diagnosis Date   ANEMIA-NOS    Asymptomatic varicose veins    Bone metastases ([place]) 2021   Breast cancer ([place]) 1996   LEFT BREAST CA   BREAST CANCER, HX OF 01/1995 dx   s/p Lumpectomy, XRT, chemo and 87yrarmidex   Gestational diabetes mellitus in childbirth, diet controlled 1985   Hypothyroid 09/28/2015   Dx 09/2015   Leukopenia    mild since chemo   OSTEOARTHRITIS    Personal history of chemotherapy    Personal history of radiation [name]    SCOLIOSIS, MILD     PAST SURGICAL HISTORY: Past Surgical History:  Procedure Laterality Date   BREAST BIOPSY     BREAST LUMPECTOMY Left    1996   left lumpectomy  1[place]    [facility] [facility]  Problem Relation Age of Onset   Heart disease Father 570      AMI   Hyperlipidemia Father    Valvular heart disease Father 780  Parkinson's disease Father 760  Arthritis Mother    Breast cancer Mother 574  Hyperlipidemia Mother    Osteoporosis Mother    Pancreatic cancer Maternal Grandmother 748      presumed dx   Diabetes Paternal Grandfather        type 2   Breast cancer Maternal Aunt   The patient's father died at the age of 854from heart disease.  He had Lewy body dementia.  The patient's mother is 819years old as of January 2021.  She is in a memory unit.  She has a history of breast cancer diagnosed in her 510s(ductal carcinoma in situ).  The patient's mother sister had breast cancer diagnosed around age 190  The patient's mother's mother also had cancer, "internal" (possibly ovarian or gastrointestinal).   There is no cancer on the father's side of the family.   GYNECOLOGIC HISTORY:  No LMP recorded. Patient is postmenopausal. Menarche: 65years old Age at first live birth[place] 65[place] with chemotherapy, in 1996 Contraceptive a little over a year, with no complications HRT no  Hysterectomy?  No Salpingo-oophorectomy?  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  She and her husband Brittany Gullingmet while working for [employer]  [place] probably [place] exposed to some radiation at that time.  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  They both finally retired 03/28/2020.  Their daughter Brittany [name] a healthcare  lawyer in [place].  Their son Brittany [name] works for the same [employer] that the patient worked for.  Note that Brittany [name] did the [employer].  The patient has 1 grandchild (in [place]).  The patient attends [employer]: The patient's husband is her healthcare power of attorney   HEALTH MAINTENANCE: Social History   Tobacco Use   Smoking status: Former    Types: Cigarettes    Quit date: 06/25/1977    Years since quitting: 43.9   Smokeless tobacco: Never  Substance Use Topics   Alcohol use: Yes    Alcohol/week: 3.0 - 4.0 standard drinks    Types: 3 - 4 Glasses of wine per week   Drug use: No     Colonoscopy: 2015  PAP: Up-to-date  Bone density: 11/20/2018, normal   No Known Allergies  Current Outpatient Medications  Medication Sig Dispense Refill   acetaminophen (TYLENOL) 500 MG tablet Take 1 tablet (500 mg total) by mouth in the morning, at noon, and at bedtime. Take with aleve 220 mg 90 tablet 0   baclofen (LIORESAL) 20 MG tablet Take 1 tablet (20 mg total) by mouth 3 (three) times daily as needed for muscle spasms. (Patient not taking:  Reported on 05/05/2021) 90 tablet 3   Calcium Citrate 250 MG TABS Take 4 tablets (1,000 mg total) by mouth daily.  0   capecitabine (XELODA) 500 MG tablet Take 2 tablets (1000 mg) daily Monday through Friday, skip Saturdays and Sundays. 120 tablet 6   Cholecalciferol (VITAMIN D3) 1000 UNITS CAPS Take by mouth daily.     Denosumab (XGEVA [place]) Inject into the skin.     diclofenac sodium (VOLTAREN) 1 % GEL Apply 2 g topically 3 (three) times daily as needed. 100 g 1   gabapentin (NEURONTIN) 300 MG capsule Take 1 capsule (300 mg total) by mouth 3 (three) times daily. 270 capsule 1   levothyroxine (SYNTHROID) 50 MCG tablet Take 1 tablet (50 mcg total) by mouth daily. 90 tablet 3   Multiple Vitamin (MULTIVITAMIN) tablet Take 1 tablet by mouth daily.     naproxen sodium (ALEVE) 220 MG tablet Take 1 tablet (220 mg total) by mouth in the morning, at noon, and at bedtime. Take with tylenol 500 mg 90 tablet 4   Omega-3 Fatty Acids (FISH OIL) 1000 MG CPDR Take by mouth daily.     traMADol (ULTRAM) 50 MG tablet Take 1-2 tablets (50-100 mg total) by mouth every 6 (six) hours as needed. (Patient not taking: Reported on 05/05/2021) 90 tablet 0   No current facility-administered medications for this visit.    OBJECTIVE:  white woman who appears stated age  65:   05/28/21 1017  BP: 127/85  Pulse: 72  Resp: 16  Temp: 97.9 F (36.6 C)  SpO2: 100%      Body mass index is 22.4 kg/m.   Wt Readings from Last 3 Encounters:  05/28/21 143 lb (64.9 kg)  05/05/21 143 lb (64.9 kg)  03/11/21 137 lb 4.8 oz (62.3 kg)      ECOG FS:1 - Symptomatic but completely ambulatory  Sclerae unicteric, EOMs intact Wearing a mask No cervical or supraclavicular adenopathy Lungs no rales or rhonchi Heart regular rate and rhythm Abd soft, nontender, positive bowel sounds MSK no focal spinal tenderness, no upper extremity lymphedema Neuro: nonfocal, well oriented, appropriate affect Breasts: The right breast is benign.   The left breast is status post surgery and radiation.  There  is an indentation laterally which is unchanged from baseline.  There is no evidence of local recurrence.  Both axillae are benign.   LAB RESULTS:  CMP     Component Value Date/Time   NA 131 (L) 05/25/2021 1326   NA 139 12/10/2013 0841   K 4.8 05/25/2021 1326   K 4.5 12/10/2013 0841   CL 97 (L) 05/25/2021 1326   CL 102 12/01/2012 0955   CO2 27 05/25/2021 1326   CO2 28 12/10/2013 0841   GLUCOSE 111 (H) 05/25/2021 1326   GLUCOSE 81 12/10/2013 0841   GLUCOSE 88 12/01/2012 0955   BUN 12 05/25/2021 1326   BUN 14 08/04/2015 0000   BUN 17.5 12/10/2013 0841   CREATININE 0.91 05/25/2021 1326   CREATININE 0.9 12/10/2013 0841   CALCIUM 9.6 05/25/2021 1326   CALCIUM 9.6 12/10/2013 0841   PROT 7.0 05/25/2021 1326   PROT 6.7 12/10/2013 0841   ALBUMIN 4.0 05/25/2021 1326   ALBUMIN 3.9 12/10/2013 0841   AST 35 05/25/2021 1326   AST 22 12/10/2013 0841   ALT 26 05/25/2021 1326   ALT 27 12/10/2013 0841   ALKPHOS 66 05/25/2021 1326   ALKPHOS 54 12/10/2013 0841   BILITOT 0.3 05/25/2021 1326   BILITOT 0.41 12/10/2013 0841   GFRNONAA >60 05/25/2021 1326   GFRAA >60 07/02/2020 1324    No results found for: Ronnald Ramp, A1GS, A2GS, BETS, BETA2SER, GAMS, MSPIKE, SPEI  No results found for: KPAFRELGTCHN, LAMBDASER, [facility]  Lab Results  Component Value Date   WBC 4.1 05/25/2021   NEUTROABS 2.2 05/25/2021   HGB 10.3 (L) 05/25/2021   HCT 29.2 (L) 05/25/2021   MCV 97.0 05/25/2021   PLT 247 05/25/2021      Chemistry      Component Value Date/Time   NA 131 (L) 05/25/2021 1326   NA 139 12/10/2013 0841   K 4.8 05/25/2021 1326   K 4.5 12/10/2013 0841   CL 97 (L) 05/25/2021 1326   CL 102 12/01/2012 0955   CO2 27 05/25/2021 1326   CO2 28 12/10/2013 0841   BUN 12 05/25/2021 1326   BUN 14 08/04/2015 0000   BUN 17.5 12/10/2013 0841   CREATININE 0.91 05/25/2021 1326   CREATININE 0.9 12/10/2013 0841   GLU 102  08/04/2015 0000      Component Value Date/Time   CALCIUM 9.6 05/25/2021 1326   CALCIUM 9.6 12/10/2013 0841   ALKPHOS 66 05/25/2021 1326   ALKPHOS 54 12/10/2013 0841   AST 35 05/25/2021 1326   AST 22 12/10/2013 0841   ALT 26 05/25/2021 1326   ALT 27 12/10/2013 0841   BILITOT 0.3 05/25/2021 1326   BILITOT 0.41 12/10/2013 0841      No results found for: LABCA2  No components found for: [id number]  No results for input(s): INR in the last 168 hours.  No results found for: LABCA2  No results found for: CAN199  Lab Results  Component Value Date   HAL937 21.1 11/23/2019    No results found for: TKW409  Lab Results  Component Value Date   CA2729 37.4 05/25/2021    No components found for: HGQUANT  Lab Results  Component Value Date   CEA1 7.99 (H) 05/25/2021   /  CEA ([facility])  Date Value Ref Range Status  05/25/2021 7.99 (H) 0.00 - 5.00 ng/mL Final    Comment:    (NOTE) This test was performed using Architect's Chemiluminescent Microparticle Immunoassay. Values obtained from different assay methods cannot  be used interchangeably. Please note that 5-10% of patients who smoke may see CEA levels up to 6.9 ng/mL. Performed at [facility], [place] [address] [place], [place] [address]      No results found for: AFPTUMOR  No results found for: CHROMOGRNA   No results found for: HGBA, HGBA2QUANT, HGBFQUANT, HGBSQUAN (Hemoglobinopathy evaluation)   Lab Results  Component Value Date   LDH 193 12/10/2013    No results found for: IRON, TIBC, IRONPCTSAT (Iron and TIBC)  No results found for: FERRITIN  Urinalysis    Component Value Date/Time   COLORURINE YELLOW 09/17/2014 [place] 09/17/2014 0824   LABSPEC 1.015 09/17/2014 0824   PHURINE 7.5 09/17/2014 [place] 09/17/2014 [place] 09/17/2014 [place] 09/17/2014 0824   KETONESUR NEGATIVE 09/17/2014 0824    UROBILINOGEN 0.2 09/17/2014 0824   NITRITE NEGATIVE 09/17/2014 0824   LEUKOCYTESUR NEGATIVE 09/17/2014 0824    STUDIES: No results found.   ELIGIBLE FOR AVAILABLE RESEARCH PROTOCOL: no  ASSESSMENT: 65 y.o. [place] woman status post left lumpectomy 1996 for a T1 N1, anatomic stage II invasive lobular breast cancer, estrogen receptor positive  (a) s/p adjuvant chemotherapy with doxorubicin and cyclophosphamide x4  (b) status post adjuvant radiation  (c) status post tamoxifen for 5+ years  (1) benign leukopenia: noted 2011, felt secondary to prior chemotherapy  (2) genetics testing 06/23/2018 through [facility] panel found no deleterious mutations in the 84 genes tested including BRCA 1-2  (a) 2 variants of uncertain significance were fund in [place] [c.1037C>T (p.Ser346Phe) and c.503C>G (p.ALA168Gly)  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, 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 (3) staging studies:  (a) breast MRI 10/30/2019 shows sternal mottling  (b) bone scan 11/19/2019 shows multiple areas of uptake suspicious for metastatic disease  (c) CT of the chest abdomen and pelvis 11/28/2019 shows no visceral disease  (d) tumor markers 11/23/2019 shows a CEA of 18.11, CA 27-29 of 61.2, CA 125 of 21.1  (e) F-18-estradiol PET (cerianna) 01/11/2020 shows multiple sclerotic bone lesions which do not accumulate the estrogen receptor specific radiotracer  (f) bone marrow biopsy 01/25/2020 confirms metastatic breast cancer, gross cystic disease fluid protein positive, but estrogen and progesterone receptor negative.  HER-2/neu was 2+ on immunohistochemistry negative by FISH (1.35/1.70).  (g) CARIS confirms triple negative disease, finds a positive androgen receptor at 3+, 70%; and the PIK3CA was indeterminate.  PTEN was positive by IHC at 90%.  PD-L1 was negative.  Genomic LOH was low and MSI was stable with proficient mismatch mismatch repair.  BRCA 1 and 2 were negative.  (4) denosumab/Xgeva started  12/13/2019, repeated monthly  (5) metronomic capecitabine started 02/25/2020, with significant plantar dysesthesia developing after 1 week, drug held 03/12/2020  (a) capecitabine restarted 03/25/2020 at 1000 mg in the morning only.  (b) bone scan 07/23/2020 shows no evidence of disease progression   (c) MRI of the total spine and pelvis 11/01/2020 shows multiple bone lesions but no epidural encroachment, no compression fracture and no evidence for lymphangitic spread of disease; there is significant degenerative disease  (d) repeat bone scan 01/13/2021 difficult to interpret, no definitive progression  (e) capecitabine was held as of 01/30/2021 with significant palmar plantar erythrodysesthesia  (f) capecitabine resumed 03/10/2021 at a lower dose (1 g every other day).  (g) dose increased to 1 g daily Monday through Friday, off on weekends as of 04/28/2021 (h) other options include CMF, sacituzumab govitecan, bicalutamide  PLAN: Brittany [name] is doing well as far as her metastatic breast cancer is concerned.  I do not believe she has symptoms related to the disease.  The back pain she has is secondary to her degenerative disease and other local issues and she has an appointment at [facility] to reconsider surgical option for that.  Even though she is doing so well right now she gets into problems with plantar erythrodysesthesia and then she can barely walk 20 steps because of the pain.  Accordingly I think it would be appropriate for her to have a disability sticker so she can park next to stores and restaurants when she goes out.  I went ahead and wrote that for her today.  I am going to try to get the sildenafil cream for her.  Once she gets that she will start using it on her palms and soles.  After she has done that a day or 2 she is going to increase the capecitabine dose to 2 tablets in the morning every day.  Organ to continue to follow the CA 27-29 and she will have readings on 0901 and 09 28.  She will  see me on 929 and if there has been a continued rise despite these changes we will set her up for a bone scan.  She knows to call for any other issue that may develop before the next visit  Total encounter time 35 minutes.Sarajane Jews C. Thetis Schwimmer, MD 05/28/21 10:44 AM Medical Oncology and Hematology [facility] [place], [place] [address] Tel. [phone]    Fax. [phone]   I, Wilburn Mylar, am acting as scribe for Dr. Virgie Dad. Joshlyn Beadle.  I, Lurline Del MD, have reviewed the above documentation for accuracy and completeness, and I agree with the above.   *Total Encounter Time as defined by the Centers for Medicare and Medicaid Services includes, in addition to the face-to-face time of a patient visit (documented in the note above) non-face-to-face time: obtaining and reviewing outside history, ordering and reviewing medications, tests or procedures, care coordination (communications with other health care professionals or caregivers) and documentation in the medical record.

## 2021-05-27 NOTE — Therapy (Signed)
[place], [place], [address] Phone: [phone]   Fax:  [phone]  Physical Therapy Treatment  Patient Details  Name: Brittany [name] MRN: [id number] Date of Birth: Feb 11, 1956 Referring Provider (Brittany [name]): Lura Em, MD   Encounter Date: 05/27/2021   Brittany [name] End of Session - 05/27/21 1035     Visit Number 15    Number of Visits 17    Date for Brittany [name] Re-Evaluation 05/29/21    Authorization Type MCR/BCBS    Brittany [name] Start Time 0900    Brittany [name] Stop Time 0945    Brittany [name] Time Calculation (min) 45 min    Equipment Utilized During Treatment Other (comment)    Activity Tolerance Patient tolerated treatment well    Behavior During Therapy [facility] for tasks assessed/performed             Past Medical History:  Diagnosis Date   ANEMIA-NOS    Asymptomatic varicose veins    Bone metastases ([place]) 2021   Breast cancer ([place]) 1996   LEFT BREAST CA   BREAST CANCER, HX OF 01/1995 dx   s/p Lumpectomy, XRT, chemo and 61yrarmidex   Gestational diabetes mellitus in childbirth, diet controlled 1985   Hypothyroid 09/28/2015   Dx 09/2015   Leukopenia    mild since chemo   OSTEOARTHRITIS    Personal history of chemotherapy    Personal history of radiation therapy    SCOLIOSIS, MILD     Past Surgical History:  Procedure Laterality Date   BREAST BIOPSY     BREAST LUMPECTOMY Left    1996   left lumpectomy  [place]    2002 & [facility] [facility]   There were no vitals filed for this visit.   Subjective Assessment - 05/27/21 1031     Subjective Brittany SPikusenters pool area and reports bil hip pain L> R.  she remarks that she feels so much better and is able to walk better since she has added aquatics with [facility].    Pertinent History bilat THA, She does have a history of metastatic breast cancer.  She had a bone scan in September showing stable lesions throughout the spine.     Diagnostic tests "Lumbar x-rays reveal slight anterolisthesis of L5 on S1 with facet arthropathy at that level and to a milder degree in the upper levels.  The hip prosthesis looks intact with good alignment, no sign of loosening. "    Pain Score 3     Pain Location Hip    Pain Orientation Right;Left   L> R   Pain Descriptors / Indicators Nagging             [address] - therapeutic pool temp 90-92 degrees Brittany [name] enters building without AD.  Treatment took place in water 3.8 to  4 ft 8 in.feet deep depending upon activity.  Brittany [name] entered and exited the pool via stair and handrails independently.     Brittany SChenevertentered water for aquatic therapy reinforcing principles and therapeutic effects of water as she ambulated and acclimated to pool. With warm up.      She was able to use aquatic barbells while ambulating to increase abdominal engagement with exercises forward, backward and side to side stepping  Brittany [name] educated on neutral posture and hip hinging in seated position with water at chest level x 10 with stretch to  low back and then x 10 with back at pool wall at external cue, VC for neck tucked to prevent hyperextension.   Runners stretch  2 x  30 sec Left and then moves into hamstring stretch  Then runners stretch on R and then move into hamstring stretch . TC to insure proper technique. Figure 4 squat stretch with UE support for R and L x 60 sec each   Gastroc stretch against wall on R and L     Brittany [name] was able to use aquatic kickboards  submerged for increased abdominal engagement      On edge of pool with bil UE support and using weighted ankle wts   Brittany [name] performed LE exercise  Hip abd/add R/L 20 x each and then using 1 UE support Hip ext/flex with knee straight x 20, Brittany [name] needing VC and TC for correct execution and sequencing  Marching knee/hip 90/90 x 10 and then with added pool noodle for increased resistance x 10 each leg  ham curl R/L x 20.  Squats  x 20 reps with intermittent UE support  Triangle pose with noodle Cat camel with noodle Standing Quad stretch       Bad Ragaz, Brittany [name] with yellow pool noodle around hips and nek doodle for neck support.  .  Brittany [name] assisted into supine floating position by lying head on shoulder of Brittany [name] to get into floating position. . Brittany [name] at torso and assisting with trunk left to right and vice versa to engage trunk muscles. Brittany [name] then rotated trunk in order to engage abdomnal (internal and external obliques)  Emphasis on breathing techniques to draw in abdominals for support.  Brittany [name] then utilizing posterior chain and engaging Hip extension and knee flexion with water resistance while Brittany [name] used Aquastretch techniques to decrease muscle tension in low back and bil gluteal muscles with decrease in sharpness of pain post RX session.      Brittany [name] requires the buoyancy of water for active assisted exercises with buoyancy supported for strengthening and AROM exercises: Brittany [name]  requires the viscosity of the water for resistance with strengthening /balance exercises Hydrostatic pressure also supports joints by unweighting joint load by at least 50 % in 3-4 feet depth water. 80% in chest to neck deep water. Water will allow for  reduced joint loading through buoyancy to help patient improve posture without excess stress and pain.                            Brittany [name] Education - 05/27/21 1033     Education Details Brittany [name] educated on therapueutic principles of water , hip hinging and stretching/exercise in water.    Person(s) Educated Patient    Methods Explanation;Demonstration;Tactile cues;Verbal cues    Comprehension Verbalized understanding;Returned demonstration              Brittany [name] Short Term Goals - 04/06/21 0935       Brittany [name] SHORT TERM GOAL #1   Title Brittany [name] will tolerate short duration of swinging golf club to indicate improved core control    Status Achieved      Brittany [name] SHORT TERM GOAL #2   Title 5TSTS to 12s    Baseline 9s     Status Achieved               Brittany [name] Long Term Goals - 04/06/21 0936       Brittany [name] LONG TERM GOAL #1   Title Brittany [name] will perform lateral motions  in small plyometric form to return to tennis play and PLOF    Baseline improved tolerance with notable balance difficulties in landing & direction changes    Status On-going      Brittany [name] LONG TERM GOAL #2   Title Brittany [name] will verbalize understanding for progression of body weight exercises    Baseline met at this point but requires further progresion to meet goals.    Status Partially Met      Brittany [name] LONG TERM GOAL #3   Title will verbalize physical preparedness to attend sons wedding in August    Status Achieved      Brittany [name] LONG TERM GOAL #4   Title Lumbar ROM improved to [facility] with minimal amount of pain    Status Achieved      Brittany [name] LONG TERM GOAL #5   Title able to demo LLE control in CKC by reducing femoral IR    Baseline most notable in Lt, slight on Rt    Target Date 05/29/21                   Plan - 05/27/21 1035     Clinical Impression Statement Brittany [name] enters aquatic area with no AD and enthusiastic to begin aquatic treatment session  Brittany [name] does complain of L> Rhip pain.  Brittany [name] is able to decrease discomfort after RX session completed.  Brittany [name] utilizes good form and requires minimal cuing to execute exercises properly.  Brittany [name] states she was unable to walk very well before she had aquatics.  Brittany [name] does show initially gait pattern with decrease hip dissociation in bil hips so most of RX working on hip dissociation, best demonstrated with ambulation/ supine floating using posterior chain exercises for hip.  Will continue to participate in aquatics with good carryover onto land/clinic visits.   Brittany [name] requires the buoyancy of water for active  exercises with buoyancy supported for strengthening and AROM exercises: Brittany [name]  requires the viscosity of the water for resistance with strengthening /balance exercises  Hydrostatic pressure also supports joints by unweighting joint load  by at least 50 % in 3-4 feet depth water. 80% in chest to neck deep water.    Personal Factors and Comorbidities Comorbidity 3+    Comorbidities metastatic breast CA-stable lesions, stenosis, OA, mild scoliosis    Examination-Activity Limitations Squat;Stairs;Lift;Bed Mobility;Bend;Stand;Locomotion Level;Carry;Sit    Examination-Participation Restrictions Shop;Cleaning;Community Activity;Driving    Brittany [name] Frequency 1x / week    Brittany [name] Duration 6 weeks    Brittany [name] Treatment/Interventions ADLs/Self Care Home Management;Cryotherapy;Aquatic Therapy;Moist Heat;Gait training;Functional mobility training;Stair training;Neuromuscular re-education;Balance training;Therapeutic exercise;Therapeutic activities;Patient/family education;Manual techniques;Passive range of motion;Taping    Brittany [name] Next Visit Plan cont with aquatic therapy.    Brittany [name] Home Exercise Plan Access Code: [email or web address] (pool)    Consulted and Agree with Plan of Care Patient             Patient will benefit from skilled therapeutic intervention in order to improve the following deficits and impairments:  Difficulty walking, Decreased balance, Increased muscle spasms, Improper body mechanics, Decreased activity tolerance, Decreased strength, Postural dysfunction, Pain  Visit Diagnosis: Muscle weakness (generalized)  Chronic bilateral low back pain without sciatica  Other abnormalities of gait and mobility  Pain in left hip  Acute left-sided low back pain with left-sided sciatica     Problem List Patient Active Problem List   Diagnosis Date Noted   Left shoulder pain 02/17/2021   Lumbar radiculopathy 10/07/2020   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   Malignant neoplasm of central  portion of left breast in female, estrogen receptor positive ([place]) 11/22/2019   Bone metastases ([place]) 11/22/2019   Anatomical narrow angle glaucoma 09/25/2018   History of gestational diabetes 09/21/2017   Raynaud phenomenon 09/21/2016   History of total  replacement of both hip joints 01/12/2016   Left rotator cuff tear 10/07/2015   Hypothyroid 09/28/2015   Leukopenia 12/08/2012   Metatarsalgia of both feet 09/21/2011   CERVICALGIA 05/04/2010   Asymptomatic varicose veins 03/17/2009   Osteoarthritis 03/17/2009   SCOLIOSIS, MILD 03/17/2009   Brittany [name], Brittany [name], Brittany [name] Certified Exercise Expert for the Aging Adult  05/27/21 12:28 PM Phone: [phone] Fax: [place], [place], [address] Phone: [phone]   Fax:  [phone]  Name: CAMELLE HENKELS MRN: [id number] Date of Birth: November 10, 1955

## 2021-05-28 ENCOUNTER — Inpatient Hospital Stay: Payer: Medicare Other

## 2021-05-28 ENCOUNTER — Inpatient Hospital Stay (HOSPITAL_BASED_OUTPATIENT_CLINIC_OR_DEPARTMENT_OTHER): Payer: Medicare Other | Admitting: Oncology

## 2021-05-28 ENCOUNTER — Other Ambulatory Visit: Payer: Self-pay | Admitting: *Deleted

## 2021-05-28 VITALS — BP 127/85 | HR 72 | Temp 97.9°F | Resp 16 | Ht 67.0 in | Wt 143.0 lb

## 2021-05-28 DIAGNOSIS — C7951 Secondary malignant neoplasm of bone: Secondary | ICD-10-CM

## 2021-05-28 DIAGNOSIS — C50112 Malignant neoplasm of central portion of left female breast: Secondary | ICD-10-CM

## 2021-05-28 DIAGNOSIS — Z170 Estrogen receptor positive status [ER+]: Secondary | ICD-10-CM | POA: Diagnosis not present

## 2021-05-28 MED ORDER — UNABLE TO FIND: 3 refills | Status: DC

## 2021-05-28 MED ORDER — DENOSUMAB 120 MG/1.7ML ~~LOC~~ SOLN
SUBCUTANEOUS | Status: AC
  Filled 2021-05-28: qty 1.7

## 2021-05-28 MED ORDER — DENOSUMAB 120 MG/1.7ML ~~LOC~~ SOLN
120.0000 mg | Freq: Once | SUBCUTANEOUS | Status: AC
  Administered 2021-05-28: 120 mg via SUBCUTANEOUS

## 2021-05-28 NOTE — Telephone Encounter (Signed)
This RN sent prescription for sildenafil 2% cream for palmar plantar erythrodysethesia to the [facility].

## 2021-05-28 NOTE — Patient Instructions (Signed)
Denosumab injection What is this medication? DENOSUMAB (den oh sue mab) slows bone breakdown. Prolia is used to treat osteoporosis in women after menopause and in men, and in people who are taking corticosteroids for 6 months or more. Delton See is used to treat a high calcium level due to cancer and to prevent bone fractures and other bone problems caused by multiple myeloma or cancer bone metastases. Delton See is also used totreat giant cell tumor of the bone. This medicine may be used for other purposes; ask your health care provider orpharmacist if you have questions. COMMON BRAND NAME(S): Prolia, XGEVA What should I tell my care team before I take this medication? They need to know if you have any of these conditions: dental disease having surgery or tooth extraction infection kidney disease low levels of calcium or Vitamin D in the blood malnutrition on hemodialysis skin conditions or sensitivity thyroid or parathyroid disease an unusual reaction to denosumab, other medicines, foods, dyes, or preservatives pregnant or trying to get pregnant breast-feeding How should I use this medication? This medicine is for injection under the skin. It is given by a health careprofessional in a hospital or clinic setting. A special MedGuide will be given to you before each treatment. Be sure to readthis information carefully each time. For Prolia, talk to your pediatrician regarding the use of this medicine in children. Special care may be needed. For Delton See, talk to your pediatrician regarding the use of this medicine in children. While this drug may be prescribed for children as young as 13 years for selected conditions,precautions do apply. Overdosage: If you think you have taken too much of this medicine contact apoison control center or emergency room at once. NOTE: This medicine is only for you. Do not share this medicine with others. What if I miss a dose? It is important not to miss your dose. Call  your doctor or health careprofessional if you are unable to keep an appointment. What may interact with this medication? Do not take this medicine with any of the following medications: other medicines containing denosumab This medicine may also interact with the following medications: medicines that lower your chance of fighting infection steroid medicines like prednisone or cortisone This list may not describe all possible interactions. Give your health care provider a list of all the medicines, herbs, non-prescription drugs, or dietary supplements you use. Also tell them if you smoke, drink alcohol, or use illegaldrugs. Some items may interact with your medicine. What should I watch for while using this medication? Visit your doctor or health care professional for regular checks on your progress. Your doctor or health care professional may order blood tests andother tests to see how you are doing. Call your doctor or health care professional for advice if you get a fever, chills or sore throat, or other symptoms of a cold or flu. Do not treat yourself. This drug may decrease your body's ability to fight infection. Try toavoid being around people who are sick. You should make sure you get enough calcium and vitamin D while you are taking this medicine, unless your doctor tells you not to. Discuss the foods you eatand the vitamins you take with your health care professional. See your dentist regularly. Brush and floss your teeth as directed. Before youhave any dental work done, tell your dentist you are receiving this medicine. Do not become pregnant while taking this medicine or for 5 months after stopping it. Talk with your doctor or health care professional about your  birth control options while taking this medicine. Women should inform their doctor if they wish to become pregnant or think they might be pregnant. There is a potential for serious side effects to an unborn child. Talk to your health  careprofessional or pharmacist for more information. What side effects may I notice from receiving this medication? Side effects that you should report to your doctor or health care professionalas soon as possible: allergic reactions like skin rash, itching or hives, swelling of the face, lips, or tongue bone pain breathing problems dizziness jaw pain, especially after dental work redness, blistering, peeling of the skin signs and symptoms of infection like fever or chills; cough; sore throat; pain or trouble passing urine signs of low calcium like fast heartbeat, muscle cramps or muscle pain; pain, tingling, numbness in the hands or feet; seizures unusual bleeding or bruising unusually weak or tired Side effects that usually do not require medical attention (report to yourdoctor or health care professional if they continue or are bothersome): constipation diarrhea headache joint pain loss of appetite muscle pain runny nose tiredness upset stomach This list may not describe all possible side effects. Call your doctor for medical advice about side effects. You may report side effects to FDA at1-800-FDA-1088. Where should I keep my medication? This medicine is only given in a clinic, doctor's office, or other health caresetting and will not be stored at home. NOTE: This sheet is a summary. It may not cover all possible information. If you have questions about this medicine, talk to your doctor, pharmacist, orhealth care provider.  2022 Elsevier/Gold Standard (2018-02-17 16:10:44)

## 2021-05-29 ENCOUNTER — Telehealth: Payer: Self-pay | Admitting: Oncology

## 2021-05-29 NOTE — Telephone Encounter (Signed)
Scheduled appts per 8/4 los. Called pt, no answer. Left msg with appts dates and times.

## 2021-06-01 ENCOUNTER — Ambulatory Visit (HOSPITAL_BASED_OUTPATIENT_CLINIC_OR_DEPARTMENT_OTHER): Payer: Medicare Other | Admitting: Physical Therapy

## 2021-06-01 ENCOUNTER — Other Ambulatory Visit: Payer: Self-pay

## 2021-06-01 ENCOUNTER — Encounter (HOSPITAL_BASED_OUTPATIENT_CLINIC_OR_DEPARTMENT_OTHER): Payer: Self-pay | Admitting: Physical Therapy

## 2021-06-01 DIAGNOSIS — R2689 Other abnormalities of gait and mobility: Secondary | ICD-10-CM

## 2021-06-01 DIAGNOSIS — G8929 Other chronic pain: Secondary | ICD-10-CM

## 2021-06-01 DIAGNOSIS — M6281 Muscle weakness (generalized): Secondary | ICD-10-CM

## 2021-06-01 DIAGNOSIS — C7951 Secondary malignant neoplasm of bone: Secondary | ICD-10-CM | POA: Diagnosis not present

## 2021-06-01 NOTE — Therapy (Signed)
[place], [place], [address] Phone: [phone]   Fax:  [phone]  Physical Therapy Treatment/Discharge  Patient Details  Name: Brittany [name] MRN: [id number] Date of Birth: Jan 28, 1956 Referring Provider (PT): Lura Em, MD   Encounter Date: 06/01/2021   PT End of Session - 06/01/21 1349     Visit Number 16    Number of Visits 17    Date for PT Re-Evaluation 06/01/21    Authorization Type MCR/BCBS    Progress Note Due on Visit 17    PT Start Time 1345    PT Stop Time 1410    PT Time Calculation (min) 25 min    Activity Tolerance Patient tolerated treatment well    Behavior During Therapy [facility] for tasks assessed/performed             Past Medical History:  Diagnosis Date   ANEMIA-NOS    Asymptomatic varicose veins    Bone metastases ([place]) 2021   Breast cancer ([place]) 1996   LEFT BREAST CA   BREAST CANCER, HX OF 01/1995 dx   s/p Lumpectomy, XRT, chemo and 33yrarmidex   Gestational diabetes mellitus in childbirth, diet controlled 1985   Hypothyroid 09/28/2015   Dx 09/2015   Leukopenia    mild since chemo   OSTEOARTHRITIS    Personal history of chemotherapy    Personal history of radiation therapy    SCOLIOSIS, MILD     Past Surgical History:  Procedure Laterality Date   BREAST BIOPSY     BREAST LUMPECTOMY Left    1996   left lumpectomy  1[place]    [place]   There were no vitals filed for this visit.   Subjective Assessment - 06/01/21 1346     Subjective I feel good but I haven't done a lot recently. I have been doing a lot in the pool. I have played 9 holes of golf with some soreness the next day 1 or2 /week. going to MD in Oct to discuss removing cyst that will hopefully help the Lt dropfoot.    Patient Stated Goals get back to being active. knowledge of progression of workouts.    Currently in Pain? No/denies                 [facility] 06/01[facility]21 0001       Assessment   Medical Diagnosis LBP    Referring Provider (PT) NLura Em MD    Onset Date/Surgical Date 09/27/20    Hand Dominance Right      Home Environment   Living Environment Private residence      Prior Function   Level of Independence Independent      Cognition   Overall Cognitive Status Within Functional Limits for tasks assessed      Sensation   Additional Comments [facility]     Posture/Postural Control   Posture Comments incr thoracic kyphosis      Strength   Overall Strength Comments able to maint reduced scapular winging bilat; gross LE 5/5      Ambulation/Gait   Gait Comments significant improvment with gait                                   PT Education - 06/01/21 1416  Education Details objective measure of progress in endurance training, returning at a later date, goals and progress    Person(s) Educated Patient    Methods Explanation    Comprehension Verbalized understanding              PT Short Term Goals - 04/06/21 0935       PT SHORT TERM GOAL #1   Title pt will tolerate short duration of swinging golf club to indicate improved core control    Status Achieved      PT SHORT TERM GOAL #2   Title 5TSTS to 12s    Baseline 9s    Status Achieved               PT Long Term Goals - 06/01/21 1409       PT LONG TERM GOAL #1   Title pt will perform lateral motions in small plyometric form to return to tennis play and PLOF    Baseline not to the level of tennis    Status Partially Met      PT LONG TERM GOAL #2   Title pt will verbalize understanding for progression of body weight exercises    Status Achieved      PT LONG TERM GOAL #3   Title will verbalize physical preparedness to attend sons wedding in August    Status Achieved      PT LONG TERM GOAL #4   Title Lumbar ROM improved to [facility] with minimal amount of pain    Status Achieved       PT LONG TERM GOAL #5   Title able to demo LLE control in CKC by reducing femoral IR    Status Achieved                   Plan - 06/01/21 1417     Clinical Impression Statement Pt has met all of her goals at this time and is prepared for d/c to independent program. She will continue with her HEP while continuing to challenge her endurance in walking. Will also cont to progress tolerance for golf and begin playing pickel ball as she does not feel that she has the ability to play tennis. discussed foot up assist brace should the drop foot become too  problematic before she sees the surgeon regarding removal of cyst.    PT Treatment/Interventions ADLs/Self Care Home Management;Cryotherapy;Aquatic Therapy;Moist Heat;Gait training;Functional mobility training;Stair training;Neuromuscular re-education;Balance training;Therapeutic exercise;Therapeutic activities;Patient/family education;Manual techniques;Passive range of motion;Taping    PT Home Exercise Plan Access Code: [email or web address] (pool)    Consulted and Agree with Plan of Care Patient             Patient will benefit from skilled therapeutic intervention in order to improve the following deficits and impairments:  Difficulty walking, Decreased balance, Increased muscle spasms, Improper body mechanics, Decreased activity tolerance, Decreased strength, Postural dysfunction, Pain  Visit Diagnosis: Muscle weakness (generalized)  Chronic bilateral low back pain without sciatica  Other abnormalities of gait and mobility     Problem List Patient Active Problem List   Diagnosis Date Noted   Left shoulder pain 02/17/2021   Lumbar radiculopathy 10/07/2020   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   Malignant neoplasm of central portion of left breast in female, estrogen receptor positive ([place]) 11/22/2019   Bone metastases ([place]) 11/22/2019   Anatomical narrow angle glaucoma 09/25/2018   History of gestational diabetes  09/21/2017   Raynaud phenomenon 09/21/2016   History of total replacement of  both hip joints 01/12/2016   Left rotator cuff tear 10/07/2015   Hypothyroid 09/28/2015   Leukopenia 12/08/2012   Metatarsalgia of both feet 09/21/2011   CERVICALGIA 05/04/2010   Asymptomatic varicose veins 03/17/2009   Osteoarthritis 03/17/2009   SCOLIOSIS, MILD 03/17/2009   PHYSICAL THERAPY DISCHARGE SUMMARY  Visits from Start of Care: 16  Current functional level related to goals / functional outcomes: See above   Remaining deficits: See above   Education / Equipment: Anatomy of condition, POC, HEP exercise form/rationale   Patient agrees to discharge. Patient goals were met. Patient is being discharged due to meeting the stated rehab goals.  Abdulkarim Eberlin [facility] PT, DPT 06/01/21 2:20 PM  [facility] [place], [place], [address] Phone: [phone]   Fax:  [phone]  Name: Brittany [name] MRN: [id number] Date of Birth: Oct 08, 1956

## 2021-06-17 ENCOUNTER — Telehealth: Payer: Self-pay | Admitting: Physical Medicine and Rehabilitation

## 2021-06-17 NOTE — Telephone Encounter (Signed)
Left L5 TF on 12/17/20. Ok to repeat if helped, same problem/side, and no new injury?

## 2021-06-17 NOTE — Telephone Encounter (Signed)
Pt wanting to get sch to see Dr. Ernestina Patches for an injection. The best call back number is [phone].

## 2021-06-18 ENCOUNTER — Encounter: Payer: Self-pay | Admitting: Physical Medicine and Rehabilitation

## 2021-06-18 NOTE — Telephone Encounter (Signed)
Left message #1

## 2021-06-19 ENCOUNTER — Telehealth: Payer: Self-pay | Admitting: Physical Medicine and Rehabilitation

## 2021-06-19 NOTE — Telephone Encounter (Signed)
See previous message

## 2021-06-19 NOTE — Telephone Encounter (Signed)
Pt returned call to [facility] for an appt. Please call pt at [phone]

## 2021-06-19 NOTE — Telephone Encounter (Signed)
Scheduled for 9/8 at [place] with driver.

## 2021-06-25 ENCOUNTER — Inpatient Hospital Stay: Payer: Medicare Other | Attending: Oncology

## 2021-06-25 ENCOUNTER — Other Ambulatory Visit: Payer: Self-pay

## 2021-06-25 ENCOUNTER — Inpatient Hospital Stay: Payer: Medicare Other

## 2021-06-25 VITALS — BP 133/88 | HR 62 | Temp 98.0°F | Resp 16

## 2021-06-25 DIAGNOSIS — M160 Bilateral primary osteoarthritis of hip: Secondary | ICD-10-CM

## 2021-06-25 DIAGNOSIS — Z170 Estrogen receptor positive status [ER+]: Secondary | ICD-10-CM

## 2021-06-25 DIAGNOSIS — C50912 Malignant neoplasm of unspecified site of left female breast: Secondary | ICD-10-CM | POA: Diagnosis present

## 2021-06-25 DIAGNOSIS — C50112 Malignant neoplasm of central portion of left female breast: Secondary | ICD-10-CM

## 2021-06-25 DIAGNOSIS — I7300 Raynaud's syndrome without gangrene: Secondary | ICD-10-CM

## 2021-06-25 DIAGNOSIS — H40039 Anatomical narrow angle, unspecified eye: Secondary | ICD-10-CM

## 2021-06-25 DIAGNOSIS — Q6589 Other specified congenital deformities of hip: Secondary | ICD-10-CM

## 2021-06-25 DIAGNOSIS — Z171 Estrogen receptor negative status [ER-]: Secondary | ICD-10-CM | POA: Insufficient documentation

## 2021-06-25 DIAGNOSIS — C7951 Secondary malignant neoplasm of bone: Secondary | ICD-10-CM | POA: Diagnosis present

## 2021-06-25 DIAGNOSIS — Z96643 Presence of artificial hip joint, bilateral: Secondary | ICD-10-CM

## 2021-06-25 LAB — COMPREHENSIVE METABOLIC PANEL
ALT: 23 U/L (ref 0–44)
AST: 35 U/L (ref 15–41)
Albumin: 3.9 g/dL (ref 3.5–5.0)
Alkaline Phosphatase: 69 U/L (ref 38–126)
Anion gap: 9 (ref 5–15)
BUN: 13 mg/dL (ref 8–23)
CO2: 26 mmol/L (ref 22–32)
Calcium: 9.4 mg/dL (ref 8.9–10.3)
Chloride: 96 mmol/L — ABNORMAL LOW (ref 98–111)
Creatinine, Ser: 0.84 mg/dL (ref 0.44–1.00)
GFR, Estimated: >60 mL/min (ref >60)
Glucose, Bld: 90 mg/dL (ref 70–99)
Potassium: 5.1 mmol/L (ref 3.5–5.1)
Sodium: 131 mmol/L — ABNORMAL LOW (ref 135–145)
Total Bilirubin: 0.4 mg/dL (ref 0.3–1.2)
Total Protein: 7 g/dL (ref 6.5–8.1)

## 2021-06-25 LAB — CBC WITH DIFFERENTIAL/PLATELET
Abs Immature Granulocytes: 0.02 10*3/uL (ref 0.00–0.07)
Basophils Absolute: 0 10*3/uL (ref 0.0–0.1)
Basophils Relative: 1 %
Eosinophils Absolute: 0.1 10*3/uL (ref 0.0–0.5)
Eosinophils Relative: 2 %
HCT: 28.8 % — ABNORMAL LOW (ref 36.0–46.0)
Hemoglobin: 9.9 g/dL — ABNORMAL LOW (ref 12.0–15.0)
Immature Granulocytes: 1 %
Lymphocytes Relative: 45 %
Lymphs Abs: 1.9 10*3/uL (ref 0.7–4.0)
MCH: 35 pg — ABNORMAL HIGH (ref 26.0–34.0)
MCHC: 34.4 g/dL (ref 30.0–36.0)
MCV: 101.8 fL — ABNORMAL HIGH (ref 80.0–100.0)
Monocytes Absolute: 0.4 10*3/uL (ref 0.1–1.0)
Monocytes Relative: 10 %
Neutro Abs: 1.7 10*3/uL (ref 1.7–7.7)
Neutrophils Relative %: 41 %
Platelets: 209 10*3/uL (ref 150–400)
RBC: 2.83 MIL/uL — ABNORMAL LOW (ref 3.87–5.11)
RDW: 17.2 % — ABNORMAL HIGH (ref 11.5–15.5)
WBC: 4.2 10*3/uL (ref 4.0–10.5)
nRBC: 0 % (ref 0.0–0.2)

## 2021-06-25 LAB — CEA (IN HOUSE-CHCC): CEA (CHCC-In House): 5.05 ng/mL — ABNORMAL HIGH (ref 0.00–5.00)

## 2021-06-25 MED ORDER — DENOSUMAB 120 MG/1.7ML ~~LOC~~ SOLN
120.0000 mg | Freq: Once | SUBCUTANEOUS | Status: AC
  Administered 2021-06-25: 120 mg via SUBCUTANEOUS
  Filled 2021-06-25: qty 1.7

## 2021-06-26 LAB — CANCER ANTIGEN 27.29: CA 27.29: 47.4 U/mL — ABNORMAL HIGH (ref 0.0–38.6)

## 2021-07-02 ENCOUNTER — Encounter: Payer: Self-pay | Admitting: Physical Medicine and Rehabilitation

## 2021-07-02 ENCOUNTER — Ambulatory Visit: Payer: Self-pay

## 2021-07-02 ENCOUNTER — Ambulatory Visit (INDEPENDENT_AMBULATORY_CARE_PROVIDER_SITE_OTHER): Payer: Medicare Other | Admitting: Physical Medicine and Rehabilitation

## 2021-07-02 ENCOUNTER — Other Ambulatory Visit: Payer: Self-pay

## 2021-07-02 VITALS — BP 115/75 | HR 98

## 2021-07-02 DIAGNOSIS — M5416 Radiculopathy, lumbar region: Secondary | ICD-10-CM

## 2021-07-02 MED ORDER — METHYLPREDNISOLONE ACETATE 80 MG/ML IJ SUSP
80.0000 mg | Freq: Once | INTRAMUSCULAR | Status: AC
  Administered 2021-07-02: 80 mg

## 2021-07-02 NOTE — Progress Notes (Signed)
Pt state lower back pain that travels to her left side buttocks. Pt state walking, standing and bending makes the pain worse. Pt state she plays golf and after she in bad pain. Pt state she takes pain meds to help ease her pain.  Pt hash hx of inj on 12/17/20 pt state it helped.  Numeric Pain Rating Scale and Functional Assessment Average Pain 3   In the last MONTH (on 0-10 scale) has pain interfered with the following?  1. General activity like being  able to carry out your everyday physical activities such as walking, climbing stairs, carrying groceries, or moving a chair?  Rating(7)   +Driver, -BT, -Dye Allergies.

## 2021-07-02 NOTE — Patient Instructions (Signed)

## 2021-07-06 ENCOUNTER — Encounter: Payer: Self-pay | Admitting: Internal Medicine

## 2021-07-11 NOTE — Procedures (Signed)
Lumbosacral Transforaminal Epidural Steroid Injection - Sub-Pedicular Approach with Fluoroscopic Guidance  Patient: Brittany [name]      Date of Birth: 04/27/1956 MRN: [id number] PCP: Binnie Rail, MD      Visit Date: 07/02/2021   Universal Protocol:    Date/Time: 07/02/2021  Consent Given By: the patient  Position: PRONE  Additional Comments: Vital signs were monitored before and after the procedure. Patient was prepped and draped in the usual sterile fashion. The correct patient, procedure, and site was verified.   Injection Procedure Details:   Procedure diagnoses: Lumbar radiculopathy [M54.16]    Meds Administered:  Meds ordered this encounter  Medications   methylPREDNISolone acetate (DEPO-MEDROL) injection 80 mg    Laterality: Left  Location/Site: L5  Needle:5.0 in., 22 ga.  Short bevel or Quincke spinal needle  Needle Placement: Transforaminal  Findings:    -Comments: Excellent flow of contrast along the nerve, nerve root and into the epidural space.  Procedure Details: After squaring off the end-plates to get a true AP view, the C-arm was positioned so that an oblique view of the foramen as noted above was visualized. The target area is just inferior to the "nose of the scotty dog" or sub pedicular. The soft tissues overlying this structure were infiltrated with 2-3 ml. of 1% Lidocaine without Epinephrine.  The spinal needle was inserted toward the target using a "trajectory" view along the fluoroscope beam.  Under AP and lateral visualization, the needle was advanced so it did not puncture dura and was located close the 6 O'Clock position of the pedical in AP tracterory. Biplanar projections were used to confirm position. Aspiration was confirmed to be negative for CSF and/or blood. A 1-2 ml. volume of Isovue-250 was injected and flow of contrast was noted at each level. Radiographs were obtained for documentation purposes.   After attaining the desired flow  of contrast documented above, a 0.5 to 1.0 ml test dose of 0.25% Marcaine was injected into each respective transforaminal space.  The patient was observed for 90 seconds post injection.  After no sensory deficits were reported, and normal lower extremity motor function was noted,   the above injectate was administered so that equal amounts of the injectate were placed at each foramen (level) into the transforaminal epidural space.   Additional Comments:  The patient tolerated the procedure well Dressing: 2 x 2 sterile gauze and Band-Aid    Post-procedure details: Patient was observed during the procedure. Post-procedure instructions were reviewed.  Patient left the clinic in stable condition.

## 2021-07-11 NOTE — Progress Notes (Addendum)
Brittany [name] - 65 y.o. female MRN [id number]  Date of birth: Nov 21, 1955  Office Visit Note: Visit Date: 07/02/2021 PCP: Binnie Rail, MD Referred by: Binnie Rail, MD  Subjective: Chief Complaint  Patient presents with   Lower Back - Pain   HPI:  Brittany [name] is a 65 y.o. female who comes in today for planned repeat Left L5-S1  Lumbar Transforaminal epidural steroid injection with fluoroscopic guidance.  The patient has failed conservative care including home exercise, medications, time and activity modification.  This injection will be diagnostic and hopefully therapeutic.  Please see requesting physician notes for further details and justification. Patient received more than 50% pain relief from prior injection.   Since I have seen her she has undergone initial consultation at [facility] in the orthopedic spine surgery department.  This is with Dr. Marvel Plan.  I did review their notes.  They did not suggest any immediate surgery at this point.  She continues to follow with them.  Referring: Dr. Legrand Como Hilts   ROS Otherwise per HPI.  Assessment & Plan: Visit Diagnoses:    ICD-10-CM   1. Lumbar radiculopathy  M54.16 XR C-ARM NO REPORT    Epidural Steroid injection    methylPREDNISolone acetate (DEPO-MEDROL) injection 80 mg      Plan: No additional findings.   Meds & Orders:  Meds ordered this encounter  Medications   methylPREDNISolone acetate (DEPO-MEDROL) injection 80 mg    Orders Placed This Encounter  Procedures   XR C-ARM NO REPORT   Epidural Steroid injection    Follow-up: Return for visit to requesting physician as needed.   Procedures: No procedures performed  Lumbosacral Transforaminal Epidural Steroid Injection - Sub-Pedicular Approach with Fluoroscopic Guidance  Patient: Brittany [name]      Date of Birth: January 03, 1956 MRN: [id number] PCP: Binnie Rail, MD      Visit Date: 07/02/2021   Universal Protocol:    Date/Time: 07/02/2021  Consent  Given By: the patient  Position: PRONE  Additional Comments: Vital signs were monitored before and after the procedure. Patient was prepped and draped in the usual sterile fashion. The correct patient, procedure, and site was verified.   Injection Procedure Details:   Procedure diagnoses: Lumbar radiculopathy [M54.16]    Meds Administered:  Meds ordered this encounter  Medications   methylPREDNISolone acetate (DEPO-MEDROL) injection 80 mg    Laterality: Left  Location/Site: L5  Needle:5.0 in., 22 ga.  Short bevel or Quincke spinal needle  Needle Placement: Transforaminal  Findings:    -Comments: Excellent flow of contrast along the nerve, nerve root and into the epidural space.  Procedure Details: After squaring off the end-plates to get a true AP view, the C-arm was positioned so that an oblique view of the foramen as noted above was visualized. The target area is just inferior to the "nose of the scotty dog" or sub pedicular. The soft tissues overlying this structure were infiltrated with 2-3 ml. of 1% Lidocaine without Epinephrine.  The spinal needle was inserted toward the target using a "trajectory" view along the fluoroscope beam.  Under AP and lateral visualization, the needle was advanced so it did not puncture dura and was located close the 6 O'Clock position of the pedical in AP tracterory. Biplanar projections were used to confirm position. Aspiration was confirmed to be negative for CSF and/or blood. A 1-2 ml. volume of Isovue-250 was injected and flow of contrast was noted at each level. Radiographs were obtained for  documentation purposes.   After attaining the desired flow of contrast documented above, a 0.5 to 1.0 ml test dose of 0.25% Marcaine was injected into each respective transforaminal space.  The patient was observed for 90 seconds post injection.  After no sensory deficits were reported, and normal lower extremity motor function was noted,   the above  injectate was administered so that equal amounts of the injectate were placed at each foramen (level) into the transforaminal epidural space.   Additional Comments:  The patient tolerated the procedure well Dressing: 2 x 2 sterile gauze and Band-Aid    Post-procedure details: Patient was observed during the procedure. Post-procedure instructions were reviewed.  Patient left the clinic in stable condition.    Clinical History: EXAM:  Unenhanced Lumbar MRI   INDICATION:  Low back pain, radiating to left lower extremity.   TECHNIQUE: Multiplanar multisequential MRI images of the lumbar spine,  without the administration of intravenous contrast.   CONTRAST DOSE:   None.   COMPARISON: Correlation with lumbar films 02/12/2021.   FINDINGS:     Segmentation: Normal (correlating with preceding lumbar films, there are  tiny riblets on T12, with 5 nonrib-bearing lumbar-type levels)   Conus medullaris: No enlargement, masses or signal abnormality.. Conus  medullaris normally terminates at L1-L2   Vertebral Column: Grade 1 anterolisthesis L5-S1, as seen on prior. Diffuse  sclerotic osseous metastases redemonstrated. Focal fat signal 1.5 cm  well-circumscribed lesion in L5 vertebral body may represent intraosseous  lipoma. Vertebral body heights and intervertebral disc spaces maintained.   L1-L2: Minimal disc bulge and facet hypertrophy, without significant spinal  canal or neural foraminal stenosis   L2-L3: Broad-based disc bulge with posterior ligamentous and facet  hypertrophy, mild bilateral foraminal stenosis and lateral recess stenosis,  without discrete canal stenosis (disc material contacting the traversing  right L3 nerve root)   L3-L4: Broad-based disc bulge and marked posterior ligamentous and facet  hypertrophy resulting in moderate right, mild left foraminal stenosis and  lateral recess stenosis, with disc material contacting the traversing L4  nerve roots, right  greater than left. Mild canal stenosis.   L4-L5: Posterior to the L4 vertebral body, a 1.1 x 1.1 x 0.8 cm fluid  signal synovial cyst arises from the hypertrophic left L4-L5 facet,  displacing the nerve roots medially, and compressing the exiting left L4  nerve root as well as the traversing L5 nerve root. Superimposed  broad-based disc bulge and posterior ligamentous hypertrophy resulting in  severe canal stenosis at this level.   L5-S1: Mild disc bulge with facet arthropathy, without cement spinal canal  or neural foraminal stenosis.   Other: Paravertebral soft tissues are unremarkable.   IMPRESSION:   1. 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  1.1 cm synovial cyst arising from the left L4-L5 facet displacing the  nerve roots medially and compressing the exiting left L4 nerve root and  traversing left L5 nerve root. Superimposed broad-based disc bulge and  posterior ligamentous thickening resulting in severe canal stenosis at this  level.   3. Lateral recess stenosis L2-L3 and L3-L4 resulting in disc material  contacting the traversing L3 and L4 nerve roots respectively, right greater  than left.   4. Diffuse lumbar facet arthropathy, worst at the L4-L5 level as above   Electronically Signed by:  Richardo Hanks, MD, [facility] Radiology  Electronically Signed on:  02/19/2021 9:11 AM     Objective:  VS:  HT:    WT:  BMI:     BP:115/75  HR:98bpm  TEMP: ( )  RESP:  Physical Exam Vitals and nursing note reviewed.  Constitutional:      General: She is not in acute distress.    Appearance: Normal appearance. She is not ill-appearing.  HENT:     Head: Normocephalic and atraumatic.     Right Ear: External ear normal.     Left Ear: External ear normal.  Eyes:     Extraocular Movements: Extraocular movements intact.  Cardiovascular:     Rate and Rhythm: Normal rate.     Pulses: Normal pulses.  Pulmonary:     Effort: Pulmonary effort is normal. No  respiratory distress.  Abdominal:     General: There is no distension.     Palpations: Abdomen is soft.  Musculoskeletal:        General: Tenderness present.     Cervical back: Neck supple.     Right lower leg: No edema.     Left lower leg: No edema.     Comments: Patient has good distal strength with no pain over the greater trochanters.  No clonus or focal weakness.  Skin:    Findings: No erythema, lesion or rash.  Neurological:     General: No focal deficit present.     Mental Status: She is alert and oriented to person, place, and time.     Sensory: No sensory deficit.     Motor: No weakness or abnormal muscle tone.     Coordination: Coordination normal.  Psychiatric:        Mood and Affect: Mood normal.        Behavior: Behavior normal.     Imaging: No results found.

## 2021-07-22 ENCOUNTER — Inpatient Hospital Stay: Payer: Medicare Other

## 2021-07-22 ENCOUNTER — Other Ambulatory Visit: Payer: Self-pay

## 2021-07-22 DIAGNOSIS — C50112 Malignant neoplasm of central portion of left female breast: Secondary | ICD-10-CM

## 2021-07-22 DIAGNOSIS — C7951 Secondary malignant neoplasm of bone: Secondary | ICD-10-CM

## 2021-07-22 DIAGNOSIS — M160 Bilateral primary osteoarthritis of hip: Secondary | ICD-10-CM

## 2021-07-22 DIAGNOSIS — H40039 Anatomical narrow angle, unspecified eye: Secondary | ICD-10-CM

## 2021-07-22 DIAGNOSIS — Z96643 Presence of artificial hip joint, bilateral: Secondary | ICD-10-CM

## 2021-07-22 DIAGNOSIS — I7300 Raynaud's syndrome without gangrene: Secondary | ICD-10-CM

## 2021-07-22 DIAGNOSIS — Q6589 Other specified congenital deformities of hip: Secondary | ICD-10-CM

## 2021-07-22 LAB — CBC WITH DIFFERENTIAL/PLATELET
Abs Immature Granulocytes: 0.01 10*3/uL (ref 0.00–0.07)
Basophils Absolute: 0 10*3/uL (ref 0.0–0.1)
Basophils Relative: 1 %
Eosinophils Absolute: 0.1 10*3/uL (ref 0.0–0.5)
Eosinophils Relative: 3 %
HCT: 28.6 % — ABNORMAL LOW (ref 36.0–46.0)
Hemoglobin: 9.7 g/dL — ABNORMAL LOW (ref 12.0–15.0)
Immature Granulocytes: 0 %
Lymphocytes Relative: 39 %
Lymphs Abs: 1.6 10*3/uL (ref 0.7–4.0)
MCH: 35.3 pg — ABNORMAL HIGH (ref 26.0–34.0)
MCHC: 33.9 g/dL (ref 30.0–36.0)
MCV: 104 fL — ABNORMAL HIGH (ref 80.0–100.0)
Monocytes Absolute: 0.3 10*3/uL (ref 0.1–1.0)
Monocytes Relative: 9 %
Neutro Abs: 2 10*3/uL (ref 1.7–7.7)
Neutrophils Relative %: 48 %
Platelets: 196 10*3/uL (ref 150–400)
RBC: 2.75 MIL/uL — ABNORMAL LOW (ref 3.87–5.11)
RDW: 16.9 % — ABNORMAL HIGH (ref 11.5–15.5)
WBC: 4 10*3/uL (ref 4.0–10.5)
nRBC: 0 % (ref 0.0–0.2)

## 2021-07-22 LAB — COMPREHENSIVE METABOLIC PANEL
ALT: 29 U/L (ref 0–44)
AST: 40 U/L (ref 15–41)
Albumin: 4 g/dL (ref 3.5–5.0)
Alkaline Phosphatase: 80 U/L (ref 38–126)
Anion gap: 10 (ref 5–15)
BUN: 15 mg/dL (ref 8–23)
CO2: 24 mmol/L (ref 22–32)
Calcium: 9.1 mg/dL (ref 8.9–10.3)
Chloride: 99 mmol/L (ref 98–111)
Creatinine, Ser: 0.84 mg/dL (ref 0.44–1.00)
GFR, Estimated: >60 mL/min (ref >60)
Glucose, Bld: 101 mg/dL — ABNORMAL HIGH (ref 70–99)
Potassium: 4.6 mmol/L (ref 3.5–5.1)
Sodium: 133 mmol/L — ABNORMAL LOW (ref 135–145)
Total Bilirubin: 0.3 mg/dL (ref 0.3–1.2)
Total Protein: 7 g/dL (ref 6.5–8.1)

## 2021-07-22 LAB — CEA (IN HOUSE-CHCC): CEA (CHCC-In House): 6.26 ng/mL — ABNORMAL HIGH (ref 0.00–5.00)

## 2021-07-23 ENCOUNTER — Inpatient Hospital Stay: Payer: Medicare Other

## 2021-07-23 ENCOUNTER — Inpatient Hospital Stay (HOSPITAL_BASED_OUTPATIENT_CLINIC_OR_DEPARTMENT_OTHER): Payer: Medicare Other | Admitting: Oncology

## 2021-07-23 VITALS — BP 127/79 | HR 67 | Temp 97.9°F | Resp 18 | Ht 67.0 in | Wt 142.5 lb

## 2021-07-23 DIAGNOSIS — C7951 Secondary malignant neoplasm of bone: Secondary | ICD-10-CM | POA: Diagnosis not present

## 2021-07-23 DIAGNOSIS — Z170 Estrogen receptor positive status [ER+]: Secondary | ICD-10-CM

## 2021-07-23 DIAGNOSIS — C50112 Malignant neoplasm of central portion of left female breast: Secondary | ICD-10-CM

## 2021-07-23 LAB — CANCER ANTIGEN 27.29: CA 27.29: 46.3 U/mL — ABNORMAL HIGH (ref 0.0–38.6)

## 2021-07-23 MED ORDER — DENOSUMAB 120 MG/1.7ML ~~LOC~~ SOLN
120.0000 mg | Freq: Once | SUBCUTANEOUS | Status: AC
  Administered 2021-07-23: 120 mg via SUBCUTANEOUS
  Filled 2021-07-23: qty 1.7

## 2021-07-23 NOTE — Patient Instructions (Signed)
Denosumab injection What is this medication? DENOSUMAB (den oh sue mab) slows bone breakdown. Prolia is used to treat osteoporosis in women after menopause and in men, and in people who are taking corticosteroids for 6 months or more. Xgeva is used to treat a high calcium level due to cancer and to prevent bone fractures and other bone problems caused by multiple myeloma or cancer bone metastases. Xgeva is also used to treat giant cell tumor of the bone. This medicine may be used for other purposes; ask your health care provider or pharmacist if you have questions. COMMON BRAND NAME(S): Prolia, XGEVA What should I tell my care team before I take this medication? They need to know if you have any of these conditions: dental disease having surgery or tooth extraction infection kidney disease low levels of calcium or Vitamin D in the blood malnutrition on hemodialysis skin conditions or sensitivity thyroid or parathyroid disease an unusual reaction to denosumab, other medicines, foods, dyes, or preservatives pregnant or trying to get pregnant breast-feeding How should I use this medication? This medicine is for injection under the skin. It is given by a health care professional in a hospital or clinic setting. A special MedGuide will be given to you before each treatment. Be sure to read this information carefully each time. For Prolia, talk to your pediatrician regarding the use of this medicine in children. Special care may be needed. For Xgeva, talk to your pediatrician regarding the use of this medicine in children. While this drug may be prescribed for children as young as 13 years for selected conditions, precautions do apply. Overdosage: If you think you have taken too much of this medicine contact a poison control center or emergency room at once. NOTE: This medicine is only for you. Do not share this medicine with others. What if I miss a dose? It is important not to miss your dose.  Call your doctor or health care professional if you are unable to keep an appointment. What may interact with this medication? Do not take this medicine with any of the following medications: other medicines containing denosumab This medicine may also interact with the following medications: medicines that lower your chance of fighting infection steroid medicines like prednisone or cortisone This list may not describe all possible interactions. Give your health care provider a list of all the medicines, herbs, non-prescription drugs, or dietary supplements you use. Also tell them if you smoke, drink alcohol, or use illegal drugs. Some items may interact with your medicine. What should I watch for while using this medication? Visit your doctor or health care professional for regular checks on your progress. Your doctor or health care professional may order blood tests and other tests to see how you are doing. Call your doctor or health care professional for advice if you get a fever, chills or sore throat, or other symptoms of a cold or flu. Do not treat yourself. This drug may decrease your body's ability to fight infection. Try to avoid being around people who are sick. You should make sure you get enough calcium and vitamin D while you are taking this medicine, unless your doctor tells you not to. Discuss the foods you eat and the vitamins you take with your health care professional. See your dentist regularly. Brush and floss your teeth as directed. Before you have any dental work done, tell your dentist you are receiving this medicine. Do not become pregnant while taking this medicine or for 5 months after   stopping it. Talk with your doctor or health care professional about your birth control options while taking this medicine. Women should inform their doctor if they wish to become pregnant or think they might be pregnant. There is a potential for serious side effects to an unborn child. Talk to  your health care professional or pharmacist for more information. What side effects may I notice from receiving this medication? Side effects that you should report to your doctor or health care professional as soon as possible: allergic reactions like skin rash, itching or hives, swelling of the face, lips, or tongue bone pain breathing problems dizziness jaw pain, especially after dental work redness, blistering, peeling of the skin signs and symptoms of infection like fever or chills; cough; sore throat; pain or trouble passing urine signs of low calcium like fast heartbeat, muscle cramps or muscle pain; pain, tingling, numbness in the hands or feet; seizures unusual bleeding or bruising unusually weak or tired Side effects that usually do not require medical attention (report to your doctor or health care professional if they continue or are bothersome): constipation diarrhea headache joint pain loss of appetite muscle pain runny nose tiredness upset stomach This list may not describe all possible side effects. Call your doctor for medical advice about side effects. You may report side effects to FDA at 1-800-FDA-1088. Where should I keep my medication? This medicine is only given in a clinic, doctor's office, or other health care setting and will not be stored at home. NOTE: This sheet is a summary. It may not cover all possible information. If you have questions about this medicine, talk to your doctor, pharmacist, or health care provider.  2022 [employer]/Gold Standard (2018-02-17 16:10:44)

## 2021-07-23 NOTE — Progress Notes (Signed)
[place]  [phone] Fax:([phone]) [phone]     ID: Brittany [name] DOB: [birth year]  MR#: [id number]  RJJ#:[id number]  Patient Care Team: Binnie Rail, MD as PCP - General (Internal Medicine) Aloha Gell, MD as Consulting Physician (Obstetrics and Gynecology) Sydnee Levans, MD (Dermatology) Kengo Sturges, Virgie Dad, MD as Consulting Physician (Oncology) Richmond Campbell, MD as Consulting Physician (Gastroenterology) Stefanie Libel, MD as Consulting Physician (Sports Medicine) Eunice Blase, MD (Family Medicine) Erline Levine, MD as Consulting Physician (Neurosurgery) Hetty Blend, Alfredo Bach, MD as Referring Physician (Orthopedic Surgery) Debbra Riding, MD as Consulting Physician (Ophthalmology) Raina Mina, RPH-CPP (Pharmacist) Chauncey Cruel, MD OTHER MD:   CHIEF COMPLAINT: Stage IV lobular breast cancer  CURRENT TREATMENT: capecitabine at metronomic doses   INTERVAL HISTORY: Brittany [name] returns today for follow up of her stage IV lobular breast cancer.   She has been on and off capecitabine since 02/25/2020 at variably low doses due to palmar plantar erythrodysesthesia.  Currently she takes 2 tablets daily on Monday through Friday and then she is off for the weekends.  She has no diarrhea or mouth sores and on this protocol her palmar plantar erythrodysesthesia is improved.  She is using the sildenafil cream with some success  She began Xgeva on 12/13/2019.  Her most recent dose was 06/25/2021 and she is due for dose today.  She tolerates this with no side effects that she is aware of.  Despite her extensive bony metastases her back pain is felt to be due to degenerative disease and not cancer.  She was evaluated by neurosurgery here (Dr. Vertell Limber) who felt that he could do not do much for her.  She was then evaluated at [facility] where she was offered surgery but her pain improved.  She is tells me her doctor at [facility] has since left and she will be  seeing Dr. Marvel Plan there at the next visit for further discussion.  Brittany [name]'s most recent bone scan 01/13/2021 suggested a mixed response to treatment.  We are following her tumor markers, particularly her CEA, which has risen recently Lab Results  Component Value Date   CA2729 46.3 (H) 07/22/2021   CA2729 47.4 (H) 06/25/2021   CA2729 37.4 05/25/2021   CA2729 31.6 04/22/2021   CA2729 33.2 03/11/2021   Lab Results  Component Value Date   CEA1 6.26 (H) 07/22/2021   CEA1 5.05 (H) 06/25/2021   CEA1 7.99 (H) 05/25/2021   CEA1 7.80 (H) 04/22/2021   CEA1 5.94 (H) 03/11/2021    REVIEW OF SYSTEMS: Brittany [name] continues to do remarkably well.  She tells me that currently she is pain-free.  Her energy is "pretty good".  She just spent a week at [facility] with her son and daughter and their families and she still has a 41-year-old and 67-monthold with her.  They will return to dialysis over the next few days.  She in addition is playing golf and has greatly benefited from aquatics rehab which she is hoping to be able to continue.  COVID 19 VACCINATION STATUS: Status post Pfizer x2 with booster August 2021   HISTORY OF CURRENT ILLNESS: From the original intake note:  "Brittany [name]" SReiszhas a history of left breast cancer dating back to 89  She was followed by Dr. GBeryle Beamsand underwent lumpectomy, adjuvant chemotherapy, radiation and "hormonal therapy".  Per Brittany [name]'s recollection the tumor was less than 2 cm, 2 out of 10 lymph nodes were involved, and it was a lobular breast cancer.  She  took tamoxifen for many years and then raloxifene.  Dr. Beryle Beams also evaluated her for leukopenia which was noted at first in 2011, worked up with a negative ANA and negative rheumatoid factors.  He felt it was a benign residual from her earlier chemotherapy.  She was released from follow-up in 2015.  She has continued on intensified screening because of her family history.  On November 23, 2017 she had bilateral  breast MRIs which were unremarkable.  Mammography November 20, 2018 showed no findings suspicious of malignancy.  Breast MRI 10/30/2019 showed a new irregular 0.4 cm enhancing focus in the upper inner quadrant of the right breast.  The left breast continued to be unremarkable except for postoperative changes and there were no abnormal appearing lymph nodes.  However in that same MRI multiple areas of enhancement were noted in the sternum.  This had not been seen in the prior MRI.  Plan right MRI biopsy 11/16/2019 was canceled as the previously demonstrated 0.4 cm focus in the right breast showed only a tortuous vein at that location.  However bone scan 11/19/2019 obtained to follow-up on the sternal findings showed additional areas of concern at T12, T7-T11, L4 and ribs, as well as the sternum.  The patient's subsequent history is as detailed below.   PAST MEDICAL HISTORY: Past Medical History:  Diagnosis Date   ANEMIA-NOS    Asymptomatic varicose veins    Bone metastases ([place]) 2021   Breast cancer ([place]) 1996   LEFT BREAST CA   BREAST CANCER, HX OF 01/1995 dx   s/p Lumpectomy, XRT, chemo and 28yrarmidex   Gestational diabetes mellitus in childbirth, diet controlled 1985   Hypothyroid 09/28/2015   Dx 09/2015   Leukopenia    mild since chemo   OSTEOARTHRITIS    Personal history of chemotherapy    Personal history of radiation therapy    SCOLIOSIS, MILD     PAST SURGICAL HISTORY: Past Surgical History:  Procedure Laterality Date   BREAST BIOPSY     BREAST LUMPECTOMY Left    1996   [place]    [facility] [place] [facility] [place]  Problem Relation Age of Onset   Heart disease Father 510      AMI   Hyperlipidemia Father    Valvular heart disease Father 761  Parkinson's disease Father 731  Arthritis Mother    Breast cancer Mother 541  Hyperlipidemia Mother    Osteoporosis Mother     Pancreatic cancer Maternal Grandmother 739      presumed dx   Diabetes Paternal Grandfather        type 2   Breast cancer Maternal Aunt   The patient's father died at the age of 865from heart disease.  He had Lewy body dementia.  The patient's mother is 876years old as of January 2021.  She is in a memory unit.  She has a history of breast cancer diagnosed in her 569s(ductal carcinoma in situ).  The patient's mother sister had breast cancer diagnosed around age 67  The patient's mother's mother also had cancer, "internal" (possibly ovarian or gastrointestinal).  There is no cancer on the father's side of the family.   GYNECOLOGIC HISTORY:  No LMP recorded. Patient is postmenopausal. Menarche: 65years old Age at first live birth: 65years old GX P 2 LMP  with chemotherapy, in 1996 Contraceptive a little over a year, with no complications HRT no  Hysterectomy?  No Salpingo-oophorectomy?  No   SOCIAL HISTORY:  Jannel graduated from [employer] with a degree in psychology and IT.  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  She and her husband Clair Gulling met while working for [employer].  Note they were in [place] at the time of the [place] disaster and probably were exposed to some radiation at that time.  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  They both finally retired 03/28/2020.  Their daughter Anderson Malta is a Pharmacist, hospital in [place].  Their son Jenny Reichmann works for the same [employer] that the patient worked for.  Note that Jenny Reichmann did the [employer].  The patient has 1 grandchild (in [place]).  The patient attends [employer]: The patient's husband is her healthcare power of attorney   HEALTH MAINTENANCE: Social History   Tobacco Use   Smoking status: Former    Types: Cigarettes    Quit date: 06/25/1977    Years since quitting: 44.1   Smokeless tobacco: Never  Substance Use Topics   Alcohol use:  Yes    Alcohol/week: 3.0 - 4.0 standard drinks    Types: 3 - 4 Glasses of wine per week   Drug use: No     Colonoscopy: 2015  PAP: Up-to-date  Bone density: 11/20/2018, normal   No Known Allergies  Current Outpatient Medications  Medication Sig Dispense Refill   acetaminophen (TYLENOL) 500 MG tablet Take 1 tablet (500 mg total) by mouth in the morning, at noon, and at bedtime. Take with aleve 220 mg 90 tablet 0   baclofen (LIORESAL) 20 MG tablet Take 1 tablet (20 mg total) by mouth 3 (three) times daily as needed for muscle spasms. 90 tablet 3   Calcium Citrate 250 MG TABS Take 4 tablets (1,000 mg total) by mouth daily.  0   capecitabine (XELODA) 500 MG tablet Take 2 tablets (1000 mg) daily Monday through Friday, skip Saturdays and Sundays. 120 tablet 6   Cholecalciferol (VITAMIN D3) 1000 UNITS CAPS Take by mouth daily.     Denosumab (XGEVA [place]) Inject into the skin.     diclofenac sodium (VOLTAREN) 1 % GEL Apply 2 g topically 3 (three) times daily as needed. 100 g 1   gabapentin (NEURONTIN) 300 MG capsule Take 1 capsule (300 mg total) by mouth 3 (three) times daily. 270 capsule 1   levothyroxine (SYNTHROID) 50 MCG tablet Take 1 tablet (50 mcg total) by mouth daily. 90 tablet 3   Multiple Vitamin (MULTIVITAMIN) tablet Take 1 tablet by mouth daily.     naproxen sodium (ALEVE) 220 MG tablet Take 1 tablet (220 mg total) by mouth in the morning, at noon, and at bedtime. Take with tylenol 500 mg 90 tablet 4   Omega-3 Fatty Acids (FISH OIL) 1000 MG CPDR Take by mouth daily.     traMADol (ULTRAM) 50 MG tablet Take 1-2 tablets (50-100 mg total) by mouth every 6 (six) hours as needed. 90 tablet 0   UNABLE TO FIND Med Name : sildenafil 2% cream apply to hands and feet bid prn for CIPN 30 g 3   No current facility-administered medications for this visit.    OBJECTIVE:  white woman who appears stated age  63:   07/23/21 1121  BP: 127/79  Pulse: 67  Resp: 18  Temp: 97.9 F (36.6 C)   SpO2: 100%       Body mass index is 22.32 kg/m.   Wt Readings from Last 3 Encounters:  07/23/21 142 lb 8 oz (64.6 kg)  05/28/21 143 lb (64.9 kg)  05/05/21 143 lb (64.9 kg)      ECOG FS:1 - Symptomatic but completely ambulatory  Sclerae unicteric, EOMs intact Wearing a mask No cervical or supraclavicular adenopathy Lungs no rales or rhonchi Heart regular rate and rhythm Abd soft, nontender, positive bowel sounds MSK no focal spinal tenderness, no upper extremity lymphedema Neuro: nonfocal, well oriented, appropriate affect Breasts: The right breast is benign.  The left breast is status post surgery and radiation.  There is some change from the breast contour due to the surgery but no evidence of local recurrence.  Both axillae are benign.   LAB RESULTS:  CMP     Component Value Date/Time   NA 133 (L) 07/22/2021 1110   NA 139 12/10/2013 0841   K 4.6 07/22/2021 1110   K 4.5 12/10/2013 0841   CL 99 07/22/2021 1110   CL 102 12/01/2012 0955   CO2 24 07/22/2021 1110   CO2 28 12/10/2013 0841   GLUCOSE 101 (H) 07/22/2021 1110   GLUCOSE 81 12/10/2013 0841   GLUCOSE 88 12/01/2012 0955   BUN 15 07/22/2021 1110   BUN 14 08/04/2015 0000   BUN 17.5 12/10/2013 0841   CREATININE 0.84 07/22/2021 1110   CREATININE 0.9 12/10/2013 0841   CALCIUM 9.1 07/22/2021 1110   CALCIUM 9.6 12/10/2013 0841   PROT 7.0 07/22/2021 1110   PROT 6.7 12/10/2013 0841   ALBUMIN 4.0 07/22/2021 1110   ALBUMIN 3.9 12/10/2013 0841   AST 40 07/22/2021 1110   AST 22 12/10/2013 0841   ALT 29 07/22/2021 1110   ALT 27 12/10/2013 0841   ALKPHOS 80 07/22/2021 1110   ALKPHOS 54 12/10/2013 0841   BILITOT 0.3 07/22/2021 1110   BILITOT 0.41 12/10/2013 0841   GFRNONAA >60 07/22/2021 1110   GFRAA >60 07/02/2020 1324    No results found for: Ronnald Ramp, A1GS, A2GS, BETS, BETA2SER, GAMS, MSPIKE, SPEI  No results found for: Nils Pyle, [facility]  Lab Results  Component Value  Date   WBC 4.0 07/22/2021   NEUTROABS 2.0 07/22/2021   HGB 9.7 (L) 07/22/2021   HCT 28.6 (L) 07/22/2021   MCV 104.0 (H) 07/22/2021   PLT 196 07/22/2021      Chemistry      Component Value Date/Time   NA 133 (L) 07/22/2021 1110   NA 139 12/10/2013 0841   K 4.6 07/22/2021 1110   K 4.5 12/10/2013 0841   CL 99 07/22/2021 1110   CL 102 12/01/2012 0955   CO2 24 07/22/2021 1110   CO2 28 12/10/2013 0841   BUN 15 07/22/2021 1110   BUN 14 08/04/2015 0000   BUN 17.5 12/10/2013 0841   CREATININE 0.84 07/22/2021 1110   CREATININE 0.9 12/10/2013 0841   GLU 102 08/04/2015 0000      Component Value Date/Time   CALCIUM 9.1 07/22/2021 1110   CALCIUM 9.6 12/10/2013 0841   ALKPHOS 80 07/22/2021 1110   ALKPHOS 54 12/10/2013 0841   AST 40 07/22/2021 1110   AST 22 12/10/2013 0841   ALT 29 07/22/2021 1110   ALT 27 12/10/2013 0841   BILITOT 0.3 07/22/2021 1110   BILITOT 0.41 12/10/2013 0841      No results found for: LABCA2  No components found for: QGBEEF007  No results for input(s): INR in the last 168 hours.  No results found for: LABCA2  No results found for: CAN199  Lab Results  Component Value Date   ZLD357 21.1 11/23/2019    No results found for: SVX793  Lab Results  Component Value Date   CA2729 46.3 (H) 07/22/2021    No components found for: HGQUANT  Lab Results  Component Value Date   CEA1 6.26 (H) 07/22/2021   /  CEA ([facility])  Date Value Ref Range Status  07/22/2021 6.26 (H) 0.00 - 5.00 ng/mL Final    Comment:    (NOTE) This test was performed using Architect's Chemiluminescent Microparticle Immunoassay. Values obtained from different assay methods cannot be used interchangeably. Please note that 5-10% of patients who smoke may see CEA levels up to 6.9 ng/mL. Performed at [facility], [place] [address], [place], [place] [address]      No results found for: AFPTUMOR  No results found for: [place]   No results  found for: HGBA, HGBA2QUANT, HGBFQUANT, HGBSQUAN (Hemoglobinopathy evaluation)   Lab Results  Component Value Date   LDH 193 12/10/2013    No results found for: IRON, TIBC, IRONPCTSAT (Iron and TIBC)  No results found for: FERRITIN  Urinalysis    Component Value Date/Time   COLORURINE YELLOW 09/17/2014 [place] 09/17/2014 0824   LABSPEC 1.015 09/17/2014 0824   PHURINE 7.5 09/17/2014 [place] 09/17/2014 0824   HGBUR NEGATIVE 09/17/2014 0824   BILIRUBINUR NEGATIVE 09/17/2014 0824   KETONESUR NEGATIVE 09/17/2014 0824   UROBILINOGEN 0.2 09/17/2014 0824   NITRITE NEGATIVE 09/17/2014 0824   LEUKOCYTESUR NEGATIVE 09/17/2014 0824    STUDIES: Epidural Steroid injection  Result Date: 07/02/2021 Magnus Sinning, MD     07/11/2021  4:55 PM Lumbosacral Transforaminal Epidural Steroid Injection - Sub-Pedicular Approach with Fluoroscopic Guidance Patient: KELLER MIKELS     Date of Birth: 01-04-1956 MRN: [id number] PCP: Binnie Rail, MD     Visit Date: 07/02/2021  Universal Protocol:   Date/Time: 07/02/2021 Consent Given By: the patient Position: PRONE Additional Comments: Vital signs were monitored before and after the procedure. Patient was prepped and draped in the usual sterile fashion. The correct patient, procedure, and site was verified. Injection Procedure Details: Procedure diagnoses: Lumbar radiculopathy [M54.16]  Meds Administered: Meds ordered this encounter Medications  methylPREDNISolone acetate (DEPO-MEDROL) injection 80 mg Laterality: Left Location/Site: L5 Needle:5.0 in., 22 ga.  Short bevel or Quincke spinal needle Needle Placement: Transforaminal Findings:   -Comments: Excellent flow of contrast along the nerve, nerve root and into the epidural space. Procedure Details: After squaring off the end-plates to get a true AP view, the C-arm was positioned so that an oblique view of the foramen as noted above was visualized. The target area is just  inferior to the "nose of the scotty dog" or sub pedicular. The soft tissues overlying this structure were infiltrated with 2-3 ml. of 1% Lidocaine without Epinephrine. The spinal needle was inserted toward the target using a "trajectory" view along the fluoroscope beam.  Under AP and lateral visualization, the needle was advanced so it did not puncture dura and was located close the 6 O'Clock position of the pedical in AP tracterory. Biplanar projections were used to confirm position. Aspiration was confirmed to be negative for CSF and/or blood. A 1-2 ml. volume of Isovue-250 was injected and flow of contrast was noted at each level. Radiographs were obtained for documentation purposes.  After attaining the desired flow of contrast documented above, a 0.5 to 1.0 ml test dose of 0.25% Marcaine was injected into each respective transforaminal space.  The patient was observed for 90 seconds post injection.  After no sensory deficits were reported, and normal lower extremity motor function was noted,   the above injectate was administered so that equal amounts of the injectate were placed at each foramen (level) into the transforaminal epidural space. Additional Comments: The patient tolerated the procedure well Dressing: 2 x 2 sterile gauze and Band-Aid  Post-procedure details: Patient was observed during the procedure. Post-procedure instructions were reviewed. Patient left the clinic in stable condition.   XR C-ARM NO REPORT  Result Date: 07/02/2021 Please see Notes tab for imaging impression.    ELIGIBLE FOR AVAILABLE RESEARCH PROTOCOL: no  ASSESSMENT: 65 y.o. [place] woman status post left lumpectomy 1996 for a T1 N1, anatomic stage II invasive lobular breast cancer, estrogen receptor positive  (a) s/p adjuvant chemotherapy with doxorubicin and cyclophosphamide x4  (b) status post adjuvant radiation  (c) status post tamoxifen for 5+ years  (1) benign leukopenia: noted 2011, felt secondary to prior  chemotherapy  (2) genetics testing 06/23/2018 through [facility] MultiCancer panel found no deleterious mutations in the 84 genes tested including BRCA 1-2  (a) 2 variants of uncertain significance were fund in [place] [c.1037C>T (p.Ser346Phe) and c.503C>G (p.ALA168Gly)  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, 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 (3) staging studies:  (a) breast MRI 10/30/2019 shows sternal mottling  (b) bone scan 11/19/2019 shows multiple areas of uptake suspicious for metastatic disease  (c) CT of the chest abdomen and pelvis 11/28/2019 shows no visceral disease  (d) tumor markers 11/23/2019 shows a CEA of 18.11, CA 27-29 of 61.2, CA 125 of 21.1  (e) F-18-estradiol PET (cerianna) 01/11/2020 shows multiple sclerotic bone lesions which do not accumulate the estrogen receptor specific radiotracer  (f) bone marrow biopsy 01/25/2020 confirms metastatic breast cancer, gross cystic disease fluid protein positive, but estrogen and progesterone receptor negative.  HER-2/neu was 2+ on immunohistochemistry, negative by FISH (1.35/1.70).  (g) CARIS confirms triple negative disease, finds a positive androgen receptor at 3+, 70%; and the PIK3CA was indeterminate.  PTEN was positive by IHC at 90%.  PD-L1 was negative.  Genomic LOH was low and MSI was stable with proficient mismatch mismatch repair.  BRCA 1 and 2 were negative.  (4) denosumab/Xgeva started 12/13/2019, repeated monthly  (5) capecitabine started 02/25/2020, with significant plantar dysesthesia developing after 1 week, drug held 03/12/2020  (a) capecitabine restarted 03/25/2020 at 1000 mg in the morning only.  (b) bone scan 07/23/2020 shows no evidence of disease progression   (c) MRI of the total spine and pelvis 11/01/2020 shows multiple bone lesions but no epidural encroachment, no compression fracture and no evidence for lymphangitic spread of disease; there is significant degenerative disease  (d) repeat bone scan 01/13/2021 difficult to  interpret, no definitive progression  (e) capecitabine was held as of 01/30/2021 with significant palmar plantar erythrodysesthesia  (f) capecitabine resumed 03/10/2021 at a lower dose (1 g every other day).  (g) dose increased to 1 g daily Monday through Friday, off on weekends as of 04/28/2021 (h) other options include CMF, sacituzumab govitecan, bicalutamide   PLAN: Brittany [name] is coming up on 2 years from definitive diagnosis of metastatic breast cancer.  Her disease is clinically very well controlled and currently she has no obvious symptoms related to the disease itself.  She is tolerating very low-dose capecitabine generally well.  She has minimal palmar dryness and no cracking.  She says she has no sloughing or significant dryness of the soles of her feet which were not examined today.  She is extremely active, playing golf, walking and hiking, and having very active times with her family and friends.  I think it would be helpful to obtain a repeat total spinal MRI to see if we document new lesions.  She understands that following bone only disease is very difficult and her tumor markers currently are not particularly informative.  She is agreeable to this plan.  Otherwise she will receive Xgeva today and return in 28 days for her next dose.  She knows to call for any other issue that may develop before then.  Total encounter time 25 minutes.Sarajane Jews C. Hertha Gergen, MD 07/23/21 3:12 PM Medical Oncology and Hematology [facility] [place], [place] [address] Tel. [phone]    Fax. [phone]   I, Wilburn Mylar, am acting as scribe for Dr. Virgie Dad. Jeannie Mallinger.  I, Lurline Del MD, have reviewed the above documentation for accuracy and completeness, and I agree with the above.   *Total Encounter Time as defined by the Centers for Medicare and Medicaid Services includes, in addition to the face-to-face time of a patient visit (documented in the note  above) non-face-to-face time: obtaining and reviewing outside history, ordering and reviewing medications, tests or procedures, care coordination (communications with other health care professionals or caregivers) and documentation in the medical record.

## 2021-08-07 ENCOUNTER — Telehealth: Payer: Self-pay | Admitting: Oncology

## 2021-08-07 NOTE — Telephone Encounter (Signed)
Rescheduled per 10/13 in basket, pt has been called and confirmed appt

## 2021-08-13 ENCOUNTER — Other Ambulatory Visit: Payer: Self-pay

## 2021-08-13 ENCOUNTER — Inpatient Hospital Stay: Payer: Medicare Other

## 2021-08-13 ENCOUNTER — Inpatient Hospital Stay: Payer: Medicare Other | Attending: Oncology

## 2021-08-13 VITALS — BP 138/88 | HR 65 | Temp 97.9°F | Resp 18

## 2021-08-13 DIAGNOSIS — I7300 Raynaud's syndrome without gangrene: Secondary | ICD-10-CM

## 2021-08-13 DIAGNOSIS — Z96643 Presence of artificial hip joint, bilateral: Secondary | ICD-10-CM

## 2021-08-13 DIAGNOSIS — C50912 Malignant neoplasm of unspecified site of left female breast: Secondary | ICD-10-CM | POA: Diagnosis present

## 2021-08-13 DIAGNOSIS — Z170 Estrogen receptor positive status [ER+]: Secondary | ICD-10-CM

## 2021-08-13 DIAGNOSIS — C7951 Secondary malignant neoplasm of bone: Secondary | ICD-10-CM

## 2021-08-13 DIAGNOSIS — Z171 Estrogen receptor negative status [ER-]: Secondary | ICD-10-CM | POA: Insufficient documentation

## 2021-08-13 DIAGNOSIS — C50112 Malignant neoplasm of central portion of left female breast: Secondary | ICD-10-CM

## 2021-08-13 DIAGNOSIS — M160 Bilateral primary osteoarthritis of hip: Secondary | ICD-10-CM

## 2021-08-13 DIAGNOSIS — H40039 Anatomical narrow angle, unspecified eye: Secondary | ICD-10-CM

## 2021-08-13 DIAGNOSIS — Q6589 Other specified congenital deformities of hip: Secondary | ICD-10-CM

## 2021-08-13 LAB — COMPREHENSIVE METABOLIC PANEL
ALT: 24 U/L (ref 0–44)
AST: 34 U/L (ref 15–41)
Albumin: 3.8 g/dL (ref 3.5–5.0)
Alkaline Phosphatase: 78 U/L (ref 38–126)
Anion gap: 8 (ref 5–15)
BUN: 15 mg/dL (ref 8–23)
CO2: 26 mmol/L (ref 22–32)
Calcium: 9.2 mg/dL (ref 8.9–10.3)
Chloride: 96 mmol/L — ABNORMAL LOW (ref 98–111)
Creatinine, Ser: 0.8 mg/dL (ref 0.44–1.00)
GFR, Estimated: >60 mL/min (ref >60)
Glucose, Bld: 88 mg/dL (ref 70–99)
Potassium: 4.8 mmol/L (ref 3.5–5.1)
Sodium: 130 mmol/L — ABNORMAL LOW (ref 135–145)
Total Bilirubin: 0.2 mg/dL — ABNORMAL LOW (ref 0.3–1.2)
Total Protein: 7 g/dL (ref 6.5–8.1)

## 2021-08-13 LAB — CBC WITH DIFFERENTIAL/PLATELET
Abs Immature Granulocytes: 0.02 10*3/uL (ref 0.00–0.07)
Basophils Absolute: 0 10*3/uL (ref 0.0–0.1)
Basophils Relative: 0 %
Eosinophils Absolute: 0.1 10*3/uL (ref 0.0–0.5)
Eosinophils Relative: 2 %
HCT: 27.4 % — ABNORMAL LOW (ref 36.0–46.0)
Hemoglobin: 9.4 g/dL — ABNORMAL LOW (ref 12.0–15.0)
Immature Granulocytes: 1 %
Lymphocytes Relative: 44 %
Lymphs Abs: 1.7 10*3/uL (ref 0.7–4.0)
MCH: 36.2 pg — ABNORMAL HIGH (ref 26.0–34.0)
MCHC: 34.3 g/dL (ref 30.0–36.0)
MCV: 105.4 fL — ABNORMAL HIGH (ref 80.0–100.0)
Monocytes Absolute: 0.4 10*3/uL (ref 0.1–1.0)
Monocytes Relative: 9 %
Neutro Abs: 1.7 10*3/uL (ref 1.7–7.7)
Neutrophils Relative %: 44 %
Platelets: 192 10*3/uL (ref 150–400)
RBC: 2.6 MIL/uL — ABNORMAL LOW (ref 3.87–5.11)
RDW: 16.2 % — ABNORMAL HIGH (ref 11.5–15.5)
WBC: 3.9 10*3/uL — ABNORMAL LOW (ref 4.0–10.5)
nRBC: 0 % (ref 0.0–0.2)

## 2021-08-13 LAB — CEA (IN HOUSE-CHCC): CEA (CHCC-In House): 6.76 ng/mL — ABNORMAL HIGH (ref 0.00–5.00)

## 2021-08-13 MED ORDER — DENOSUMAB 120 MG/1.7ML ~~LOC~~ SOLN
120.0000 mg | Freq: Once | SUBCUTANEOUS | Status: AC
  Administered 2021-08-13: 120 mg via SUBCUTANEOUS
  Filled 2021-08-13: qty 1.7

## 2021-08-14 LAB — CANCER ANTIGEN 27.29: CA 27.29: 45.1 U/mL — ABNORMAL HIGH (ref 0.0–38.6)

## 2021-08-20 ENCOUNTER — Other Ambulatory Visit: Payer: Medicare Other

## 2021-08-20 ENCOUNTER — Ambulatory Visit: Payer: Medicare Other

## 2021-08-25 ENCOUNTER — Other Ambulatory Visit: Payer: Self-pay | Admitting: *Deleted

## 2021-08-25 MED ORDER — UNABLE TO FIND: 3 refills | Status: DC

## 2021-09-03 ENCOUNTER — Ambulatory Visit (INDEPENDENT_AMBULATORY_CARE_PROVIDER_SITE_OTHER): Payer: Medicare Other | Admitting: Sports Medicine

## 2021-09-03 VITALS — BP 108/72 | Ht 67.0 in | Wt 140.0 lb

## 2021-09-03 DIAGNOSIS — M7989 Other specified soft tissue disorders: Secondary | ICD-10-CM | POA: Insufficient documentation

## 2021-09-03 DIAGNOSIS — M25512 Pain in left shoulder: Secondary | ICD-10-CM | POA: Diagnosis present

## 2021-09-03 DIAGNOSIS — G8929 Other chronic pain: Secondary | ICD-10-CM

## 2021-09-03 MED ORDER — METHYLPREDNISOLONE ACETATE 40 MG/ML IJ SUSP
40.0000 mg | Freq: Once | INTRAMUSCULAR | Status: AC
  Administered 2021-09-03: 40 mg via INTRA_ARTICULAR

## 2021-09-03 NOTE — Assessment & Plan Note (Signed)
Suspect that the swelling noted is likely originally from flareup of her osteoarthritis, however given her history of prior lymph node surgery, suspect that there is difficulty draining this fluid and now causing dependent edema in her lower arm.  Recommended discussing with her oncologist for consideration of referral to lymphedema clinic.

## 2021-09-03 NOTE — Assessment & Plan Note (Addendum)
Pain is likely secondary to flare of osteoarthritis, glenohumeral joint seems to be the problem today more so than rotator cuff.  Discussed risk and benefits of glenohumeral joint corticosteroid injection under ultrasound guidance and she opted to proceed today.  This was performed per below and she tolerated procedure well.  Given HEP for pendulum swings and rotator cuff. If this is not providing improvement, coupled with her home exercise program, we will have her return to care in 6 weeks.  Procedure performed:   Ultrasound-guided left glenohumeral injection: After sterile prep with Betadine, injected 8 cc 1% lidocaine without epinephrine and 40 mg methylprednisolone using a 22-gauge spinal needle, passing the needle through approach into the glenohumeral joint.  Injectate was seen filling the joint capsule.  She had good immediate relief during the anesthetic phase.  Follow-up as directed.   Left Glenohumeral joint injection, US guided Consent obtained and verified. Time-out conducted. Noted no overlying erythema, induration, or other signs of local infection. The  left glenohumeral joint was visualized by ultrasound posteriorly.  The overlying skin was prepped in a sterile fashion. Topical analgesic spray: Ethyl chloride. Joint: left glenohumeral Needle: 22-gauge 3.5 inch Completed without difficulty. Meds: 3cc, 1% lidocaine without epinephrine and 40 mg methylprednisolone

## 2021-09-03 NOTE — Patient Instructions (Signed)
Thank you for coming to see me today. It was a pleasure. Today we talked about:   Today you received an injection with corticosteroid. This injection is usually done in response to pain and inflammation. There is some "numbing" medicine also in the shot so the injected area may be numb and feel really good for the next couple of hours. The numbing medicine usually wears off in 2-3 hours though, and then your pain level will be right back where it was before the injection.   The actually benefit from the steroid injection is usually noticed in 2-7 days. You may actually experience a small (as in 10%) INCREASE in pain in the first 24 hours---that is common.   Things to watch out for that you should contact us or a health care provider urgently would include: 1. Unusual (as in more than 10%) increase in pain 2. New fever > 101.5 3. New swelling or redness of the injected area.  4. Streaking of red lines around the area injected.   Please follow-up with [place] in 4-6 weeks if not improving.  If you have any questions or concerns, please do not hesitate to call the office at [phone].  Best,   Brittany Constable, DO [place]

## 2021-09-03 NOTE — Progress Notes (Signed)
Brittany [name] is a 65 y.o. female who presents to [facility] today for the following:  Left shoulder pain f/u Last seen for the same in April Has prior XR with severe OA [place] in April with chronic biceps and subscap changes, no tearing Has had prior subacromial injection with good improvement Was doing well, but over the last month or so was having some slight increase in pain 4 days ago, however, she had a manipulation on her left shoulder performed by her chiropractor, then went to a workout class, then the following day played 18 holes of golf, all which she does not usually do She has had significant flare in her pain since that time States that she has noticed limited range of motion as well Use Voltaren gel without any improvement Pain is mostly in the lateral aspect of her shoulder and also in the posterior aspect She noted some swelling in her arm as well that has resolved up near her shoulder, but is now on her forearm She has a history of breast cancer and has had lymph nodes removed from her left axilla, but no prior lymphedema   PMH reviewed.  ROS as above. Medications reviewed.  Exam:  BP 108/72   Ht 5\' 7"  (1.702 m)   Wt 140 lb (63.5 kg)   BMI 21.93 kg/m  Gen: Well NAD MSK:  Left Shoulder:  Inspection reveals no obvious deformity, atrophy, or asymmetry b/l. No bruising.  There is 2+ pitting edema in the dependent aspect of her left lower arm, otherwise no swelling. Palpation is normal with no TTP over [facility] joint or bicipital groove b/l. Passive flexion is limited by 10 degrees, abduction limited by 15 degrees, internal rotation and external rotation also limited by about 10 degrees.  She has pain in all fields and there is significant feeling of palpation of bone-on-bone at the shoulder with range of motion. NV intact distally b/l Normal scapular function observed b/l Special Tests:  - Impingement: Neg [place] - Supraspinatous: Negative empty can - Infraspinatous/Teres  Minor: 5/5 strength with ER - Subscapularis: 5/5 strength with IR - Biceps tendon: Positive Speeds  - Labrum: Negative Obriens - AC Joint: Negative cross arm    No results found.   Assessment and Plan: 1) Chronic left shoulder pain Pain is likely secondary to flare of osteoarthritis, glenohumeral joint seems to be the problem today more so than rotator cuff.  Discussed risk and benefits of glenohumeral joint corticosteroid injection under ultrasound guidance and she opted to proceed today.  This was performed per below and she tolerated procedure well.  Given HEP for pendulum swings and rotator cuff. If this is not providing improvement, coupled with her home exercise program, we will have her return to care in 6 weeks.  Procedure performed:   Ultrasound-guided left glenohumeral injection: After sterile prep with Betadine, injected 8 cc 1% lidocaine without epinephrine and 40 mg methylprednisolone using a 22-gauge spinal needle, passing the needle through approach into the glenohumeral joint.  Injectate was seen filling the joint capsule.  She had good immediate relief during the anesthetic phase.  Follow-up as directed.   Left Glenohumeral joint injection, US guided Consent obtained and verified. Time-out conducted. Noted no overlying erythema, induration, or other signs of local infection. The  left glenohumeral joint was visualized by ultrasound posteriorly.  The overlying skin was prepped in a sterile fashion. Topical analgesic spray: Ethyl chloride. Joint: left glenohumeral Needle: 22-gauge 3.5 inch Completed without difficulty. Meds: 3cc, 1%  lidocaine without epinephrine and 40 mg methylprednisolone    Left arm swelling Suspect that the swelling noted is likely originally from flareup of her osteoarthritis, however given her history of prior lymph node surgery, suspect that there is difficulty draining this fluid and now causing dependent edema in her lower arm.  Recommended  discussing with her oncologist for consideration of referral to lymphedema clinic.   [name], D.O.  PGY-4 [facility]  09/03/2021 2:29 PM   Patient seen and evaluated with the sports medicine fellow.  I agree with the above plan of care.  Left glenohumeral injection performed as above.  If symptoms persist then we may need to consider merits of further diagnostic imaging or referral to an orthopedic shoulder specialist.  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

## 2021-09-07 ENCOUNTER — Telehealth: Payer: Self-pay

## 2021-09-07 ENCOUNTER — Other Ambulatory Visit: Payer: Self-pay

## 2021-09-07 ENCOUNTER — Encounter: Payer: Self-pay | Admitting: Oncology

## 2021-09-07 ENCOUNTER — Encounter: Payer: Self-pay | Admitting: Adult Health

## 2021-09-07 ENCOUNTER — Inpatient Hospital Stay: Payer: Medicare Other | Attending: Oncology | Admitting: Adult Health

## 2021-09-07 VITALS — BP 134/82 | HR 79 | Temp 97.9°F | Resp 16 | Ht 67.0 in | Wt 143.2 lb

## 2021-09-07 DIAGNOSIS — Z170 Estrogen receptor positive status [ER+]: Secondary | ICD-10-CM | POA: Diagnosis not present

## 2021-09-07 DIAGNOSIS — C50112 Malignant neoplasm of central portion of left female breast: Secondary | ICD-10-CM

## 2021-09-07 DIAGNOSIS — Z87891 Personal history of nicotine dependence: Secondary | ICD-10-CM | POA: Insufficient documentation

## 2021-09-07 DIAGNOSIS — M2548 Effusion, other site: Secondary | ICD-10-CM | POA: Insufficient documentation

## 2021-09-07 DIAGNOSIS — C50912 Malignant neoplasm of unspecified site of left female breast: Secondary | ICD-10-CM | POA: Diagnosis not present

## 2021-09-07 DIAGNOSIS — C7951 Secondary malignant neoplasm of bone: Secondary | ICD-10-CM | POA: Insufficient documentation

## 2021-09-07 NOTE — Telephone Encounter (Signed)
Pt was called and offered appt with Brittany Bihari, NP at 1130 for eval of LUE swelling. Pt accepted. Appt placed and pt understands to arrive 1115 for check in.

## 2021-09-07 NOTE — Progress Notes (Signed)
[place]  [phone] [phone]     ID: Brittany [name] DOB: 08-03-1956  MR#: [id number]  AOZ#:[id number]  Patient Care Team: Binnie Rail, MD as PCP - General (Internal Medicine) Aloha Gell, MD as Consulting Physician (Obstetrics and Gynecology) Sydnee Levans, MD (Dermatology) Magrinat, Virgie Dad, MD as Consulting Physician (Oncology) Richmond Campbell, MD as Consulting Physician (Gastroenterology) Stefanie Libel, MD as Consulting Physician (Sports Medicine) Eunice Blase, MD (Family Medicine) Erline Levine, MD as Consulting Physician (Neurosurgery) Hetty Blend, Alfredo Bach, MD as Referring Physician (Orthopedic Surgery) Debbra Riding, MD as Consulting Physician (Ophthalmology) Raina Mina, RPH-CPP (Pharmacist) Scot Dock, NP OTHER MD:   CHIEF COMPLAINT: Stage IV lobular breast cancer  CURRENT TREATMENT: capecitabine at metronomic doses   INTERVAL HISTORY: Brittany [name] returns today for follow up of her stage IV lobular breast cancer.   She is here for an urgent visit for left arm swelling.  She notes that she had breast cancer on her left breast back in 1996 and had 10 lymph nodes removed.  Last week she underwent a shoulder adjustment, worked out with her trainer, and played golf all in the same day.  At the end of the day she had developed left arm swelling.  She did apply a wrap on her arm but it started bothering her.  The swelling has persisted, and improves in the morning and worsens as the day goes on.    She notes she has left shoulder arthritis and received a cortisone injection last week.  The arthritis in her shoulder is better, but she notes an increased weakness in the right shoulder joint since last week.   REVIEW OF SYSTEMS: Review of Systems  Constitutional:  Negative for appetite change, chills, fatigue, fever and unexpected weight change.  HENT:   Negative for hearing loss, lump/mass and trouble  swallowing.   Eyes:  Negative for eye problems and icterus.  Respiratory:  Negative for chest tightness, cough and shortness of breath.   Cardiovascular:  Negative for chest pain, leg swelling and palpitations.  Gastrointestinal:  Negative for abdominal distention, abdominal pain, constipation, diarrhea, nausea and vomiting.  Endocrine: Negative for hot flashes.  Genitourinary:  Negative for difficulty urinating.   Musculoskeletal:  Positive for arthralgias (left shoulder per interval history).  Skin:  Negative for itching and rash.  Neurological:  Negative for dizziness, extremity weakness, headaches and numbness.  Hematological:  Negative for adenopathy. Does not bruise/bleed easily.  Psychiatric/Behavioral:  Negative for depression. The patient is not nervous/anxious.      COVID 19 VACCINATION STATUS: Status post Pfizer x2 with booster August 2021   HISTORY OF CURRENT ILLNESS: From the original intake note:  "Brittany" [name] has a history of left breast cancer dating back to 31.  She was followed by Dr. Beryle Beams and underwent lumpectomy, adjuvant chemotherapy, radiation and "hormonal therapy".  Per Brittany's recollection the tumor was less than 2 cm, 2 out of 10 lymph nodes were involved, and it was a lobular breast cancer.  She took tamoxifen for many years and then raloxifene.  Dr. Beryle Beams also evaluated her for leukopenia which was noted at first in 2011, worked up with a negative ANA and negative rheumatoid factors.  He felt it was a benign residual from her earlier chemotherapy.  She was released from follow-up in 2015.  She has continued on intensified screening because of her family history.  On November 23, 2017 she had bilateral breast MRIs which were unremarkable.  Mammography  November 20, 2018 showed no findings suspicious of malignancy.  Breast MRI 10/30/2019 showed a new irregular 0.4 cm enhancing focus in the upper inner quadrant of the right breast.  The left breast continued  to be unremarkable except for postoperative changes and there were no abnormal appearing lymph nodes.  However in that same MRI multiple areas of enhancement were noted in the sternum.  This had not been seen in the prior MRI.  Plan right MRI biopsy 11/16/2019 was canceled as the previously demonstrated 0.4 cm focus in the right breast showed only a tortuous vein at that location.  However bone scan 11/19/2019 obtained to follow-up on the sternal findings showed additional areas of concern at T12, T7-T11, L4 and ribs, as well as the sternum.  The patient's subsequent history is as detailed below.   PAST MEDICAL HISTORY: Past Medical History:  Diagnosis Date   ANEMIA-NOS    Asymptomatic varicose veins    Bone metastases ([place]) 2021   Breast cancer ([place]) 1996   LEFT BREAST CA   BREAST CANCER, HX OF 01/1995 dx   s/p Lumpectomy, XRT, chemo and 63yrarmidex   Gestational diabetes mellitus in childbirth, diet controlled 1985   Hypothyroid 09/28/2015   Dx 09/2015   Leukopenia    mild since chemo   OSTEOARTHRITIS    Personal history of chemotherapy    Personal history of radiation therapy    SCOLIOSIS, MILD     PAST SURGICAL HISTORY: Past Surgical History:  Procedure Laterality Date   BREAST BIOPSY     BREAST LUMPECTOMY Left    1996   left lumpectomy  [facility]    [facility]  Problem Relation Age of Onset   Heart disease Father 53      AMI   Hyperlipidemia Father    Valvular heart disease Father 746  Parkinson's disease Father 774  Arthritis Mother    Breast cancer Mother 588  Hyperlipidemia Mother    Osteoporosis Mother    Pancreatic cancer Maternal Grandmother 737      presumed dx   Diabetes Paternal Grandfather        type 2   Breast cancer Maternal Aunt   The patient's father died at the age of 884from heart disease.  He had Lewy body dementia.  The patient's mother is 822 years old as of January 2021.  She is in a memory unit.  She has a history of breast cancer diagnosed in her 590s(ductal carcinoma in situ).  The patient's mother sister had breast cancer diagnosed around age 12109  The patient's mother's mother also had cancer, "internal" (possibly ovarian or gastrointestinal).  There is no cancer on the father's side of the family.   GYNECOLOGIC HISTORY:  No LMP recorded. Patient is postmenopausal. Menarche: 65years old Age at first live birth: 65years old [place] 2 LMP with chemotherapy, in 1996 Contraceptive a little over a year, with no complications HRT no  Hysterectomy?  No Salpingo-oophorectomy?  No   SOCIAL HISTORY:  [place]uated from [employer] a degree in psychology and IT.  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  They both finally retired 03/28/2020.  Their daughter Anderson Malta is a Pharmacist, hospital in [place].  Their son Jenny Reichmann works for the same [employer] that the patient worked for.  Note that Jenny Reichmann did the [employer].  The patient has 1 grandchild (in [place]).  The patient attends [employer]: The patient's husband is her healthcare power of attorney   HEALTH MAINTENANCE: Social History   Tobacco Use   Smoking status: Former    Types: Cigarettes    Quit date: 06/25/1977    Years since quitting: 44.2   Smokeless tobacco: Never  Substance Use Topics   Alcohol use: Yes    Alcohol/week: 3.0 - 4.0 standard drinks    Types: 3 - 4 Glasses of wine per week   Drug use: No     Colonoscopy: 2015  PAP: Up-to-date  Bone density: 11/20/2018, normal   No Known Allergies  Current Outpatient Medications  Medication Sig  Dispense Refill   acetaminophen (TYLENOL) 500 MG tablet Take 1 tablet (500 mg total) by mouth in the morning, at noon, and at bedtime. Take with aleve 220 mg 90 tablet 0   baclofen (LIORESAL) 20 MG tablet Take 1 tablet (20 mg total) by mouth 3 (three) times daily as needed for muscle spasms. 90 tablet 3   Calcium Citrate 250 MG TABS Take 4 tablets (1,000 mg total) by mouth daily.  0   capecitabine (XELODA) 500 MG tablet Take 2 tablets (1000 mg) daily Monday through Friday, skip Saturdays and Sundays. 120 tablet 6   Cholecalciferol (VITAMIN D3) 1000 UNITS CAPS Take by mouth daily.     Denosumab (XGEVA [place]) Inject into the skin.     diclofenac sodium (VOLTAREN) 1 % GEL Apply 2 g topically 3 (three) times daily as needed. 100 g 1   gabapentin (NEURONTIN) 300 MG capsule Take 1 capsule (300 mg total) by mouth 3 (three) times daily. 270 capsule 1   levothyroxine (SYNTHROID) 50 MCG tablet Take 1 tablet (50 mcg total) by mouth daily. 90 tablet 3   Multiple Vitamin (MULTIVITAMIN) tablet Take 1 tablet by mouth daily.     naproxen sodium (ALEVE) 220 MG tablet Take 1 tablet (220 mg total) by mouth in the morning, at noon, and at bedtime. Take with tylenol 500 mg 90 tablet 4   Omega-3 Fatty Acids (FISH OIL) 1000 MG CPDR Take by mouth daily.     traMADol (ULTRAM) 50 MG tablet Take 1-2 tablets (50-100 mg total) by mouth every 6 (six) hours as needed. 90 tablet 0   UNABLE TO FIND Med Name : sildenafil 2% cream apply to hands and feet bid prn for CIPN 30 g 3   UNABLE TO FIND Med Name : Sildenafil 2% cream apply to hands and feet bid while taking capecitibine 30 g 3   No current facility-administered medications for this visit.    OBJECTIVE:   Vitals:   09/07/21 1209  BP: 134/82  Pulse: 79  Resp: 16  Temp: 97.9 F (36.6 C)  SpO2: 100%        Body mass index is 22.43 kg/m.   Wt Readings from Last 3 Encounters:  09/07/21 143 lb 3.2 oz (65 kg)  09/03/21 140 lb (63.5 kg)  07/23/21 142 lb 8 oz (64.6 kg)       ECOG FS:1 - Symptomatic but completely ambulatory  GENERAL: Patient is a well appearing female in no acute distress HEENT:  Sclerae anicteric.  Oropharynx clear and moist. No ulcerations or evidence of oropharyngeal candidiasis. Neck is supple.  NODES:  No cervical, supraclavicular, or axillary lymphadenopathy palpated.  BREAST EXAM:  right breast benign, left breast s/p lumpectomy and radiation, no sign of local recurrence LUNGS:  Clear to auscultation bilaterally.  No wheezes or rhonchi. HEART:  Regular rate and rhythm. No murmur appreciated. ABDOMEN:  Soft, nontender.  Positive, normoactive bowel sounds. No organomegaly palpated. MSK:  No focal spinal tenderness to palpation. Full range of motion bilaterally in the upper extremities. EXTREMITIES:  + left arm lymphedema SKIN:  Clear with no obvious rashes or skin changes. No nail dyscrasia. NEURO:  Nonfocal. Well oriented.  Appropriate affect.    LAB RESULTS:  CMP     Component Value Date/Time   NA 130 (L) 08/13/2021 0955   NA 139 12/10/2013 0841   K 4.8 08/13/2021 0955   K 4.5 12/10/2013 0841   CL 96 (L) 08/13/2021 0955   CL 102 12/01/2012 0955   CO2 26 08/13/2021 0955   CO2 28 12/10/2013 0841   GLUCOSE 88 08/13/2021 0955   GLUCOSE 81 12/10/2013 0841   GLUCOSE 88 12/01/2012 0955   BUN 15 08/13/2021 0955   BUN 14 08/04/2015 0000   BUN 17.5 12/10/2013 0841   CREATININE 0.80 08/13/2021 0955   CREATININE 0.9 12/10/2013 0841   CALCIUM 9.2 08/13/2021 0955   CALCIUM 9.6 12/10/2013 0841   PROT 7.0 08/13/2021 0955   PROT 6.7 12/10/2013 0841   ALBUMIN 3.8 08/13/2021 0955   ALBUMIN 3.9 12/10/2013 0841   AST 34 08/13/2021 0955   AST 22 12/10/2013 0841   ALT 24 08/13/2021 0955   ALT 27 12/10/2013 0841   ALKPHOS 78 08/13/2021 0955   ALKPHOS 54 12/10/2013 0841   BILITOT 0.2 (L) 08/13/2021 0955   BILITOT 0.41 12/10/2013 0841   GFRNONAA >60 08/13/2021 0955   GFRAA >60 07/02/2020 1324    No results found for:  Ronnald Ramp, A1GS, A2GS, BETS, BETA2SER, GAMS, MSPIKE, SPEI  No results found for: KPAFRELGTCHN, LAMBDASER, [facility]  Lab Results  Component Value Date   WBC 3.9 (L) 08/13/2021   NEUTROABS 1.7 08/13/2021   HGB 9.4 (L) 08/13/2021   HCT 27.4 (L) 08/13/2021   MCV 105.4 (H) 08/13/2021   PLT 192 08/13/2021      Chemistry      Component Value Date/Time   NA 130 (L) 08/13/2021 0955   NA 139 12/10/2013 0841   K 4.8 08/13/2021 0955   K 4.5 12/10/2013 0841   CL 96 (L) 08/13/2021 0955   CL 102 12/01/2012 0955   CO2 26 08/13/2021 0955   CO2 28 12/10/2013 0841   BUN 15 08/13/2021 0955   BUN 14 08/04/2015 0000   BUN 17.5 12/10/2013 0841   CREATININE 0.80 08/13/2021 0955   CREATININE 0.9 12/10/2013 0841   GLU 102 08/04/2015 0000      Component Value Date/Time   CALCIUM 9.2 08/13/2021 0955   CALCIUM 9.6 12/10/2013 0841   ALKPHOS 78 08/13/2021 0955   ALKPHOS 54 12/10/2013 0841   AST 34 08/13/2021 0955   AST 22 12/10/2013 0841   ALT 24 08/13/2021 0955   ALT 27 12/10/2013 0841   BILITOT 0.2 (L) 08/13/2021 0955   BILITOT 0.41 12/10/2013 0841      No results found for: LABCA2  No components found for: [id number]  No results for input(s): INR  in the last 168 hours.  No results found for: LABCA2  No results found for: CAN199  Lab Results  Component Value Date   AOZ308 21.1 11/23/2019    No results found for: MVH846  Lab Results  Component Value Date   CA2729 45.1 (H) 08/13/2021    No components found for: HGQUANT  Lab Results  Component Value Date   CEA1 6.76 (H) 08/13/2021   /  CEA ([facility])  Date Value Ref Range Status  08/13/2021 6.76 (H) 0.00 - 5.00 ng/mL Final    Comment:    (NOTE) This test was performed using [employer]'s Chemiluminescent Microparticle Immunoassay. Values obtained from different assay methods cannot be used interchangeably. Please note that 5-10% of patients who smoke may see CEA levels up to 6.9 ng/mL. Performed  at [facility], [place] [address] [place], [place] [address]      No results found for: AFPTUMOR  No results found for: CHROMOGRNA   No results found for: HGBA, HGBA2QUANT, HGBFQUANT, HGBSQUAN (Hemoglobinopathy evaluation)   Lab Results  Component Value Date   LDH 193 12/10/2013    No results found for: IRON, TIBC, IRONPCTSAT (Iron and TIBC)  No results found for: FERRITIN  Urinalysis    Component Value Date/Time   COLORURINE YELLOW 09/17/2014 [place] 09/17/2014 0824   LABSPEC 1.015 09/17/2014 0824   PHURINE 7.5 09/17/2014 [place] 09/17/2014 [place] 09/17/2014 [place] 09/17/2014 0824   KETONESUR NEGATIVE 09/17/2014 0824   UROBILINOGEN 0.2 09/17/2014 0824   NITRITE NEGATIVE 09/17/2014 0824   LEUKOCYTESUR NEGATIVE 09/17/2014 0824    STUDIES: No results found.   ELIGIBLE FOR AVAILABLE RESEARCH PROTOCOL: no  ASSESSMENT: 65 y.o. [place] woman status post left lumpectomy 1996 for a T1 N1, anatomic stage II invasive lobular breast cancer, estrogen receptor positive  (a) s/p adjuvant chemotherapy with doxorubicin and cyclophosphamide x4  (b) status post adjuvant radiation  (c) status post tamoxifen for 5+ years  (1) benign leukopenia: noted 2011, felt secondary to prior chemotherapy  (2) genetics testing 06/23/2018 through [facility] panel found no deleterious mutations in the 84 genes tested including BRCA 1-2  (a) 2 variants of uncertain significance were fund in [place] [c.1037C>T (p.Ser346Phe) and c.503C>G (p.ALA168Gly)  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, 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 (3) staging studies:  (a) breast MRI 10/30/2019 shows sternal mottling  (b) bone scan 11/19/2019 shows multiple areas of uptake suspicious for metastatic disease  (c) CT of the chest abdomen and pelvis 11/28/2019 shows no visceral disease  (d) tumor markers 11/23/2019 shows a CEA  of 18.11, CA 27-29 of 61.2, CA 125 of 21.1  (e) F-18-estradiol PET (cerianna) 01/11/2020 shows multiple sclerotic bone lesions which do not accumulate the estrogen receptor specific radiotracer  (f) bone marrow biopsy 01/25/2020 confirms metastatic breast cancer, gross cystic disease fluid protein positive, but estrogen and progesterone receptor negative.  HER-2/neu was 2+ on immunohistochemistry, negative by FISH (1.35/1.70).  (g) CARIS confirms triple negative disease, finds a positive androgen receptor at 3+, 70%; and the PIK3CA was indeterminate.  PTEN was positive by IHC at 90%.  PD-L1 was negative.  Genomic LOH was low and MSI was stable with proficient mismatch mismatch repair.  BRCA 1 and 2 were negative.  (4) denosumab/Xgeva started 12/13/2019, repeated monthly  (5) capecitabine started 02/25/2020, with significant plantar dysesthesia developing after 1 week, drug held 03/12/2020  (a) capecitabine restarted 03/25/2020 at 1000 mg  in the morning only.  (b) bone scan 07/23/2020 shows no evidence of disease progression   (c) MRI of the total spine and pelvis 11/01/2020 shows multiple bone lesions but no epidural encroachment, no compression fracture and no evidence for lymphangitic spread of disease; there is significant degenerative disease  (d) repeat bone scan 01/13/2021 difficult to interpret, no definitive progression  (e) capecitabine was held as of 01/30/2021 with significant palmar plantar erythrodysesthesia  (f) capecitabine resumed 03/10/2021 at a lower dose (1 g every other day).  (g) dose increased to 1 g daily Monday through Friday, off on weekends as of 04/28/2021 (h) other options include CMF, sacituzumab govitecan, bicalutamide   PLAN: Brittany [name] is here for evaluation of left arm swelling.  This could certainly be lymphedema exacerbated by her activities last week including a left shoulder cortisone injection and adjustment of her left shoulder by her chiropractor, lifting heavy  weights with her physical trainer, then playing golf.  I reviewed with her however that since it has been 25 years since her surgery and lymph node removal this is possible but we also should do a CT chest to rule out any obstructive cause of the left arm swelling.  Brittany understands these 2 factors, and I have placed orders for urgent referral to physical therapy along with a CT scan of her chest.  I instructed her that she should receive a call in the next 2 days to get these 2 things scheduled and hopefully we will have these taken care of by her visit with Dr. Jana Hakim which is on September 16, 2021.  We will see Brittany [name] back in the office as noted above.  She knows to call if she has any concerns in the interim, we can always see her sooner if needed.  Total encounter time 20 minutes.*In face-to-face visit time, chart review, order entry, care coordination, and documentation of the encounter.  Wilber Bihari, NP 09/07/21 12:16 PM Medical Oncology and Hematology [facility] [place], [place] [address] Tel. [phone]    Fax. [phone]    *Total Encounter Time as defined by the Centers for Medicare and Medicaid Services includes, in addition to the face-to-face time of a patient visit (documented in the note above) non-face-to-face time: obtaining and reviewing outside history, ordering and reviewing medications, tests or procedures, care coordination (communications with other health care professionals or caregivers) and documentation in the medical record.

## 2021-09-08 NOTE — Telephone Encounter (Signed)
No entry 

## 2021-09-10 ENCOUNTER — Ambulatory Visit (HOSPITAL_COMMUNITY)
Admission: RE | Admit: 2021-09-10 | Discharge: 2021-09-10 | Disposition: A | Discharge status: Home / Self Care | Payer: Medicare Other | Source: Ambulatory Visit | Attending: Oncology | Admitting: Oncology

## 2021-09-10 DIAGNOSIS — C50112 Malignant neoplasm of central portion of left female breast: Secondary | ICD-10-CM | POA: Insufficient documentation

## 2021-09-10 DIAGNOSIS — Z170 Estrogen receptor positive status [ER+]: Secondary | ICD-10-CM | POA: Diagnosis present

## 2021-09-10 DIAGNOSIS — C7951 Secondary malignant neoplasm of bone: Secondary | ICD-10-CM | POA: Diagnosis present

## 2021-09-10 MED ORDER — GADOBUTROL 1 MMOL/ML IV SOLN
7.0000 mL | Freq: Once | INTRAVENOUS | Status: AC | PRN
  Administered 2021-09-10: 19:00:00 7 mL via INTRAVENOUS

## 2021-09-11 ENCOUNTER — Ambulatory Visit: Payer: Medicare Other | Attending: Adult Health | Admitting: Physical Therapy

## 2021-09-11 ENCOUNTER — Other Ambulatory Visit: Payer: Self-pay

## 2021-09-11 ENCOUNTER — Encounter: Payer: Self-pay | Admitting: Physical Therapy

## 2021-09-11 DIAGNOSIS — R293 Abnormal posture: Secondary | ICD-10-CM | POA: Insufficient documentation

## 2021-09-11 DIAGNOSIS — I890 Lymphedema, not elsewhere classified: Secondary | ICD-10-CM | POA: Insufficient documentation

## 2021-09-11 DIAGNOSIS — C50112 Malignant neoplasm of central portion of left female breast: Secondary | ICD-10-CM | POA: Insufficient documentation

## 2021-09-11 DIAGNOSIS — Z170 Estrogen receptor positive status [ER+]: Secondary | ICD-10-CM | POA: Diagnosis not present

## 2021-09-11 NOTE — Patient Instructions (Signed)
[email or web address] Courses Online Strength After Breast Cancer Look at the right of the page for Lymphedema Education Session     Also go to resource tab on the right and click on self MLD For upper extremity

## 2021-09-11 NOTE — Therapy (Signed)
[facility] @ [place], [place], [address] Phone: [phone]   Fax:  [phone]  Physical Therapy Evaluation  Patient Details  Name: Brittany [name] MRN: [id number] Date of Birth: 16-Dec-1955 Referring Provider (PT): Wilber Bihari   Encounter Date: 09/11/2021   PT End of Session - 09/11/21 0910     Visit Number 1    Number of Visits 17    Date for PT Re-Evaluation 11/11/21    PT Start Time 0805    PT Stop Time 0900    PT Time Calculation (min) 55 min    Activity Tolerance Patient tolerated treatment well    Behavior During Therapy [facility] for tasks assessed/performed             Past Medical History:  Diagnosis Date   ANEMIA-NOS    Asymptomatic varicose veins    Bone metastases ([place]) 2021   Breast cancer ([place]) 1996   LEFT BREAST CA   BREAST CANCER, HX OF 01/1995 dx   s/p Lumpectomy, XRT, chemo and 78yr armidex   Gestational diabetes mellitus in childbirth, diet controlled 1985   Hypothyroid 09/28/2015   Dx 09/2015   Leukopenia    mild since chemo   OSTEOARTHRITIS    Personal history of chemotherapy    Personal history of radiation therapy    SCOLIOSIS, MILD     Past Surgical History:  Procedure Laterality Date   BREAST BIOPSY     BREAST LUMPECTOMY Left    1996   left lumpectomy  [facility]     2002 & 2003-Dr. vail @ [facility]    There were no vitals filed for this visit.    Subjective Assessment - 09/11/21 0807     Subjective Pt had recent onset of swelling of swelling in whole left arm ( but not hand ) after extra movement from chiropractor, 18 holes of golf. She treated it with elevation, ace wrapping and rest and she feels that the swelling is almost.    Currently in Pain? No/denies                [facility] PT Assessment - 09/11/21 0001       Assessment   Medical Diagnosis metastatic breast cancer    Referring Provider (PT) Wilber Bihari     Onset Date/Surgical Date 10/25/94   approx. found out about metastasis to spine in Jan 2021   Hand Dominance Right      Precautions   Precautions Other (comment)   lifting precautions     Balance Screen   Has the patient fallen in the past 6 months No    Has the patient had a decrease in activity level because of a fear of falling?  No    Is the patient reluctant to leave their home because of a fear of falling?  No      Home Ecologist residence    Living Arrangements Spouse/significant other      Prior Function   Level of Frederic Retired    Secondary school teacher, golf, Oceanographer trainder 2 times a week , bicycling and pool in summer      Cognition   Overall Cognitive Status Within Functional Limits for tasks assessed      Observation/Other Assessments   Observations small visible fullness in left upper arm      Sensation  Additional Comments WFL      Coordination   Gross Motor Movements are Fluid and Coordinated No   limited by pain and crepitus in left shoulder     Posture/Postural Control   Posture/Postural Control Postural limitations    Postural Limitations Forward head;Rounded Shoulders;Increased thoracic kyphosis      ROM / Strength   AROM / PROM / Strength AROM      AROM   Overall AROM  Deficits    Overall AROM Comments audible and palpable creiptus in left shoulder with ROM    AROM Assessment Site Shoulder    Right/Left Shoulder Right;Left    Right Shoulder Extension 65 Degrees    Right Shoulder Flexion 155 Degrees    Right Shoulder ABduction 170 Degrees    Right Shoulder Internal Rotation 45 Degrees    Right Shoulder External Rotation 75 Degrees    Left Shoulder Extension 45 Degrees    Left Shoulder Flexion 138 Degrees    Left Shoulder ABduction 85 Degrees    Left Shoulder Internal Rotation 35 Degrees   crepitus felt and heard   Left Shoulder External Rotation 60 Degrees   crepitus  felt and heard     Strength   Overall Strength Within functional limits for tasks performed    Overall Strength Comments Pt is frequent exerciser      Palpation   Palpation comment no lymphostatic fibrosis palpated in left upper or lower arm      Ambulation/Gait   Gait Comments --               LYMPHEDEMA/ONCOLOGY QUESTIONNAIRE - 09/11/21 0001       Type   Cancer Type left breast cancer      Surgeries   Lumpectomy Date 10/25/94   approx   Number Lymph Nodes Removed 10      Treatment   Past Chemotherapy Treatment Yes    Past Radiation Treatment Yes    Body Site left upper quadrant      What other symptoms do you have   Are you Having Heaviness or Tightness No    Are you having Pain No    Are you having pitting edema No    Is it Hard or Difficult finding clothes that fit No    Do you have infections No    Is there Decreased scar mobility No    Stemmer Sign No      Lymphedema Assessments   Lymphedema Assessments Upper extremities      Right Upper Extremity Lymphedema   10 cm Proximal to Olecranon Process 25 cm    Olecranon Process 24.5 cm    15 cm Proximal to Ulnar Styloid Process 22 cm    Just Proximal to Ulnar Styloid Process 15.8 cm    Across Hand at [employer] 18.5 cm    At [place] of 2nd Digit 6.2 cm      Left Upper Extremity Lymphedema   10 cm Proximal to Olecranon Process 27.5 cm    Olecranon Process 25 cm    15 cm Proximal to Ulnar Styloid Process 23.5 cm    Just Proximal to Ulnar Styloid Process 16 cm    Across Hand at [employer] 18 cm    At [place] of 2nd Digit 5.8 cm                     Objective measurements completed on examination: See above findings.  [facility] Adult PT Treatment/Exercise - 09/11/21 0001       Self-Care   Self-Care Other Self-Care Comments    Other Self-Care Comments  gave pt small tg soft with large fold to below elbow to wear for comfort and remove as wanted                     PT  Education - 09/11/21 0902     Education Details gave pt information for [employer] training website for self MLD and for lymphedema education session    Person(s) Educated Patient    Methods Explanation;Demonstration    Comprehension Verbalized understanding              PT Short Term Goals - 09/11/21 0921       PT SHORT TERM GOAL #1   Title Pt will be independent in self manual lymph drainage    Time 4    Status New               PT Long Term Goals - 09/11/21 0109       PT LONG TERM GOAL #1   Title Pt will have reduction of 1 cm at 10 cm proximal to olecranon of left arm.    Baseline 27.5 cm on 09/11/2021    Time 8    Period Weeks    Status New      PT LONG TERM GOAL #2   Title Pt will be able to verbalize routine for lymphedema prophylaxis and management of lymphedema at home.    Time 8    Period Weeks    Status New                    Plan - 09/11/21 0911     Clinical Impression Statement Pt developed swelling in her arm a few weeks ago after excessive activity that is resolving, but still present with increased circumference measurements in upper arm, elbow and lower arm.  She has taken the inititative for self care at home, but would benefit from PT to teach self MLD, and possbily compression bandaging, decided on appropriate compression sleeve and gauntlet and teach pt home managment techniques.  Pt was introduced to this today and will follow with a video at home and use a temporary tg soft compression sleeve. She is very active and has Physiological scientist and private Pilates sessions with modifictions for her. Pt agreeable to plan and is anxious to learn how to prevent this flare up in the future.    Comorbidities metastatic breast CA-stable lesions, stenosis, OA,  limitation in left shoulder with crepitus    Examination-Activity Limitations Reach Overhead    Stability/Clinical Decision Making Stable/Uncomplicated    Clinical Decision Making Low    Rehab  Potential Good    PT Frequency 2x / week    PT Duration 8 weeks   likely will not need all sessions and can decrease frequency over time   PT Treatment/Interventions ADLs/Self Care Home Management;Therapeutic exercise;Patient/family education;Orthotic Fit/Training;Manual techniques;Manual lymph drainage;Compression bandaging;Passive range of motion;Vasopneumatic Device;Taping    PT Next Visit Plan Remeasure to see if tg soft has helped. If pt reduced consider measuring for off the shelf flat knit from compression guru (?) sleeve and gauntlet to wear during activity. Do SOZO for baseline and set up monitoring Perform and teach self MLD and consider teaching compression bandaging if pt not reducing.    Consulted and Agree with Plan of Care Patient  Patient will benefit from skilled therapeutic intervention in order to improve the following deficits and impairments:  Impaired UE functional use, Increased edema, Impaired flexibility, Postural dysfunction, Increased fascial restricitons, Decreased strength, Decreased range of motion  Visit Diagnosis: Lymphedema, not elsewhere classified - Plan: PT plan of care cert/re-cert  Abnormal posture - Plan: PT plan of care cert/re-cert     Problem List 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   Malignant neoplasm of central portion of left breast in female, estrogen receptor positive ([place]) 11/22/2019   Bone metastases ([place]) 11/22/2019   Anatomical narrow angle glaucoma 09/25/2018   History of gestational diabetes 09/21/2017   Raynaud phenomenon 09/21/2016   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 Owens Shark PT  Norwood Levo, PT 09/11/2021, 9:26 AM  [facility] @ [place], [place], [address] Phone: [phone]   Fax:  [phone]  Name: CYRSTAL LEITZ MRN: [id number] Date of Birth: 14-Sep-1956

## 2021-09-15 ENCOUNTER — Ambulatory Visit: Payer: Medicare Other | Admitting: Physical Therapy

## 2021-09-15 ENCOUNTER — Encounter (HOSPITAL_COMMUNITY): Payer: Self-pay

## 2021-09-15 ENCOUNTER — Ambulatory Visit (HOSPITAL_COMMUNITY)
Admission: RE | Admit: 2021-09-15 | Discharge: 2021-09-15 | Disposition: A | Discharge status: Home / Self Care | Payer: Medicare Other | Source: Ambulatory Visit | Attending: Adult Health | Admitting: Adult Health

## 2021-09-15 ENCOUNTER — Other Ambulatory Visit: Payer: Self-pay

## 2021-09-15 ENCOUNTER — Encounter: Payer: Self-pay | Admitting: Physical Therapy

## 2021-09-15 DIAGNOSIS — R293 Abnormal posture: Secondary | ICD-10-CM

## 2021-09-15 DIAGNOSIS — Z170 Estrogen receptor positive status [ER+]: Secondary | ICD-10-CM | POA: Diagnosis present

## 2021-09-15 DIAGNOSIS — I890 Lymphedema, not elsewhere classified: Secondary | ICD-10-CM | POA: Diagnosis not present

## 2021-09-15 DIAGNOSIS — C50112 Malignant neoplasm of central portion of left female breast: Secondary | ICD-10-CM | POA: Insufficient documentation

## 2021-09-15 DIAGNOSIS — C7951 Secondary malignant neoplasm of bone: Secondary | ICD-10-CM | POA: Insufficient documentation

## 2021-09-15 LAB — POCT I-STAT CREATININE: Creatinine, Ser: 0.8 mg/dL (ref 0.44–1.00)

## 2021-09-15 MED ORDER — IOHEXOL 350 MG/ML SOLN
60.0000 mL | Freq: Once | INTRAVENOUS | Status: AC | PRN
  Administered 2021-09-15: 60 mL via INTRAVENOUS

## 2021-09-15 NOTE — Therapy (Signed)
[facility] @ [place], [place], [address] Phone: [phone]   Fax:  [phone]  Physical Therapy Treatment  Patient Details  Name: Brittany [name] MRN: [id number] Date of Birth: Oct 11, 1956 Referring Provider (PT): Wilber Bihari   Encounter Date: 09/15/2021   PT End of Session - 09/15/21 1456     Visit Number 2    Number of Visits 17    Date for PT Re-Evaluation 11/11/21    PT Start Time 3491    PT Stop Time 7915    PT Time Calculation (min) 43 min    Activity Tolerance Patient tolerated treatment well    Behavior During Therapy [facility] for tasks assessed/performed             Past Medical History:  Diagnosis Date   ANEMIA-NOS    Asymptomatic varicose veins    Bone metastases ([place]) 2021   Breast cancer ([place]) 1996   LEFT BREAST CA   BREAST CANCER, HX OF 01/1995 dx   s/p Lumpectomy, XRT, chemo and 86yr armidex   Gestational diabetes mellitus in childbirth, diet controlled 1985   Hypothyroid 09/28/2015   Dx 09/2015   Leukopenia    mild since chemo   OSTEOARTHRITIS    Personal history of chemotherapy    Personal history of radiation therapy    SCOLIOSIS, MILD     Past Surgical History:  Procedure Laterality Date   BREAST BIOPSY     BREAST LUMPECTOMY Left    1996   left lumpectomy  [place]     2002 & 2003-Dr. vail @ [facility]    There were no vitals filed for this visit.   Subjective Assessment - 09/15/21 1410     Subjective I wore the temporary sleeve on Friday when I did a workout and it did well. I have not had time to do a workout since. The top left arm is more noticeable but it is better than it was the first day.    Patient Stated Goals for the lymphedema to go away    Currently in Pain? Yes    Pain Score 2     Pain Location Arm    Pain Orientation Left    Pain Descriptors / Indicators Aching    Pain Type Chronic pain    Pain Onset More than a  month ago    Aggravating Factors  lying on L side to sleeve    Pain Relieving Factors rest    Effect of Pain on Daily Activities hard to lift things up high                   LYMPHEDEMA/ONCOLOGY QUESTIONNAIRE - 09/15/21 0001       Left Upper Extremity Lymphedema   10 cm Proximal to Olecranon Process 26 cm    Olecranon Process 24.7 cm    15 cm Proximal to Ulnar Styloid Process 22.4 cm    Just Proximal to Ulnar Styloid Process 15.6 cm    Across Hand at [employer] 17.5 cm    At [place] of 2nd Digit 6 cm                        [facility] Adult PT Treatment/Exercise - 09/15/21 0001       Manual Therapy   Manual Therapy Manual Lymphatic Drainage (MLD);Edema management    Edema Management measured pt  for an exo strong sleeve and gauntlet and issued info on how to obtain    Manual Lymphatic Drainage (MLD) in supine: short neck, 5 diaphragmatic breaths, right axillary nodes and establishment of interaxillary pathway, left inguinal nodes and establishment of axillo inguinal pathway, LUE working proximal to distal then retracing all steps while verbally instructing pt in anatomy and physiology of the lymphatic system and proper skin stretch                       PT Short Term Goals - 09/11/21 0921       PT SHORT TERM GOAL #1   Title Pt will be independent in self manual lymph drainage    Time 4    Status New               PT Long Term Goals - 09/11/21 6301       PT LONG TERM GOAL #1   Title Pt will have reduction of 1 cm at 10 cm proximal to olecranon of left arm.    Baseline 27.5 cm on 09/11/2021    Time 8    Period Weeks    Status New      PT LONG TERM GOAL #2   Title Pt will be able to verbalize routine for lymphedema prophylaxis and management of lymphedema at home.    Time 8    Period Weeks    Status New                   Plan - 09/15/21 1457     Clinical Impression Statement Measured pt for exo strong sleeve and  gauntlet to wear when she is exercising. Pt reports the TG soft helped when she wore it to exercise. Remeasured circumferences today and they had decreased in her L upper arm by 1.5. Issued info to pt on how to obtain compression sleeve and gauntlet. Began MLD to LUE today and educated pt in anatomy and physiology of the lymphatic system throughout. Will instruct pt in self MLD at next session.    PT Frequency 2x / week    PT Duration 8 weeks    PT Treatment/Interventions ADLs/Self Care Home Management;Therapeutic exercise;Patient/family education;Orthotic Fit/Training;Manual techniques;Manual lymph drainage;Compression bandaging;Passive range of motion;Vasopneumatic Device;Taping    PT Next Visit Plan Do SOZO for baseline and set up monitoring Perform and teach self MLD .    Consulted and Agree with Plan of Care Patient             Patient will benefit from skilled therapeutic intervention in order to improve the following deficits and impairments:  Impaired UE functional use, Increased edema, Impaired flexibility, Postural dysfunction, Increased fascial restricitons, Decreased strength, Decreased range of motion  Visit Diagnosis: Lymphedema, not elsewhere classified  Abnormal posture     Problem List 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   Malignant neoplasm of central portion of left breast in female, estrogen receptor positive ([place]) 11/22/2019   Bone metastases ([place]) 11/22/2019   Anatomical narrow angle glaucoma 09/25/2018   History of gestational diabetes 09/21/2017   Raynaud phenomenon 09/21/2016   History of total replacement of both hip joints 01/12/2016   Left rotator cuff tear 10/07/2015   Hypothyroid 09/28/2015   Leukopenia 12/08/2012   Metatarsalgia of both feet 09/21/2011   CERVICALGIA  05/04/2010    Asymptomatic varicose veins 03/17/2009   Osteoarthritis 03/17/2009   SCOLIOSIS, MILD 03/17/2009    Manus Gunning, PT 09/15/2021, 3:00 PM  [facility] @ [place], [place], [address] Phone: [phone]   Fax:  [phone]  Name: ZUNAIRA LAMY MRN: [id number] Date of Birth: 1956-07-11   Manus Gunning, PT 09/15/21 3:00 PM

## 2021-09-16 ENCOUNTER — Inpatient Hospital Stay: Payer: Medicare Other

## 2021-09-16 ENCOUNTER — Inpatient Hospital Stay (HOSPITAL_BASED_OUTPATIENT_CLINIC_OR_DEPARTMENT_OTHER): Payer: Medicare Other | Admitting: Oncology

## 2021-09-16 VITALS — BP 133/85 | HR 77 | Temp 98.1°F | Resp 17 | Wt 141.5 lb

## 2021-09-16 DIAGNOSIS — Z170 Estrogen receptor positive status [ER+]: Secondary | ICD-10-CM

## 2021-09-16 DIAGNOSIS — M5416 Radiculopathy, lumbar region: Secondary | ICD-10-CM | POA: Diagnosis not present

## 2021-09-16 DIAGNOSIS — C7951 Secondary malignant neoplasm of bone: Secondary | ICD-10-CM

## 2021-09-16 DIAGNOSIS — H40039 Anatomical narrow angle, unspecified eye: Secondary | ICD-10-CM

## 2021-09-16 DIAGNOSIS — Z96643 Presence of artificial hip joint, bilateral: Secondary | ICD-10-CM

## 2021-09-16 DIAGNOSIS — C50912 Malignant neoplasm of unspecified site of left female breast: Secondary | ICD-10-CM | POA: Diagnosis not present

## 2021-09-16 DIAGNOSIS — C50112 Malignant neoplasm of central portion of left female breast: Secondary | ICD-10-CM

## 2021-09-16 DIAGNOSIS — I7300 Raynaud's syndrome without gangrene: Secondary | ICD-10-CM

## 2021-09-16 DIAGNOSIS — M160 Bilateral primary osteoarthritis of hip: Secondary | ICD-10-CM

## 2021-09-16 DIAGNOSIS — M1909 Primary osteoarthritis, other specified site: Secondary | ICD-10-CM

## 2021-09-16 DIAGNOSIS — Q6589 Other specified congenital deformities of hip: Secondary | ICD-10-CM

## 2021-09-16 LAB — COMPREHENSIVE METABOLIC PANEL
ALT: 24 U/L (ref 0–44)
AST: 37 U/L (ref 15–41)
Albumin: 3.9 g/dL (ref 3.5–5.0)
Alkaline Phosphatase: 69 U/L (ref 38–126)
Anion gap: 8 (ref 5–15)
BUN: 13 mg/dL (ref 8–23)
CO2: 25 mmol/L (ref 22–32)
Calcium: 9.1 mg/dL (ref 8.9–10.3)
Chloride: 98 mmol/L (ref 98–111)
Creatinine, Ser: 0.81 mg/dL (ref 0.44–1.00)
GFR, Estimated: >60 mL/min (ref >60)
Glucose, Bld: 91 mg/dL (ref 70–99)
Potassium: 4.6 mmol/L (ref 3.5–5.1)
Sodium: 131 mmol/L — ABNORMAL LOW (ref 135–145)
Total Bilirubin: 0.3 mg/dL (ref 0.3–1.2)
Total Protein: 7.1 g/dL (ref 6.5–8.1)

## 2021-09-16 LAB — CBC WITH DIFFERENTIAL/PLATELET
Abs Immature Granulocytes: 0.04 10*3/uL (ref 0.00–0.07)
Basophils Absolute: 0 10*3/uL (ref 0.0–0.1)
Basophils Relative: 0 %
Eosinophils Absolute: 0.2 10*3/uL (ref 0.0–0.5)
Eosinophils Relative: 3 %
HCT: 27.4 % — ABNORMAL LOW (ref 36.0–46.0)
Hemoglobin: 9.2 g/dL — ABNORMAL LOW (ref 12.0–15.0)
Immature Granulocytes: 1 %
Lymphocytes Relative: 41 %
Lymphs Abs: 2.4 10*3/uL (ref 0.7–4.0)
MCH: 36.1 pg — ABNORMAL HIGH (ref 26.0–34.0)
MCHC: 33.6 g/dL (ref 30.0–36.0)
MCV: 107.5 fL — ABNORMAL HIGH (ref 80.0–100.0)
Monocytes Absolute: 0.5 10*3/uL (ref 0.1–1.0)
Monocytes Relative: 8 %
Neutro Abs: 2.6 10*3/uL (ref 1.7–7.7)
Neutrophils Relative %: 47 %
Platelets: 166 10*3/uL (ref 150–400)
RBC: 2.55 MIL/uL — ABNORMAL LOW (ref 3.87–5.11)
RDW: 15.7 % — ABNORMAL HIGH (ref 11.5–15.5)
WBC: 5.7 10*3/uL (ref 4.0–10.5)
nRBC: 0.4 % — ABNORMAL HIGH (ref 0.0–0.2)

## 2021-09-16 LAB — CEA (IN HOUSE-CHCC): CEA (CHCC-In House): 7.45 ng/mL — ABNORMAL HIGH (ref 0.00–5.00)

## 2021-09-16 MED ORDER — DENOSUMAB 120 MG/1.7ML ~~LOC~~ SOLN
120.0000 mg | Freq: Once | SUBCUTANEOUS | Status: AC
  Administered 2021-09-16: 120 mg via SUBCUTANEOUS
  Filled 2021-09-16: qty 1.7

## 2021-09-16 NOTE — Patient Instructions (Signed)
Denosumab injection What is this medication? DENOSUMAB (den oh sue mab) slows bone breakdown. Prolia is used to treat osteoporosis in women after menopause and in men, and in people who are taking corticosteroids for 6 months or more. Xgeva is used to treat a high calcium level due to cancer and to prevent bone fractures and other bone problems caused by multiple myeloma or cancer bone metastases. Xgeva is also used to treat giant cell tumor of the bone. This medicine may be used for other purposes; ask your health care provider or pharmacist if you have questions. COMMON BRAND NAME(S): Prolia, XGEVA What should I tell my care team before I take this medication? They need to know if you have any of these conditions: dental disease having surgery or tooth extraction infection kidney disease low levels of calcium or Vitamin D in the blood malnutrition on hemodialysis skin conditions or sensitivity thyroid or parathyroid disease an unusual reaction to denosumab, other medicines, foods, dyes, or preservatives pregnant or trying to get pregnant breast-feeding How should I use this medication? This medicine is for injection under the skin. It is given by a health care professional in a hospital or clinic setting. A special MedGuide will be given to you before each treatment. Be sure to read this information carefully each time. For Prolia, talk to your pediatrician regarding the use of this medicine in children. Special care may be needed. For Xgeva, talk to your pediatrician regarding the use of this medicine in children. While this drug may be prescribed for children as young as 13 years for selected conditions, precautions do apply. Overdosage: If you think you have taken too much of this medicine contact a poison control center or emergency room at once. NOTE: This medicine is only for you. Do not share this medicine with others. What if I miss a dose? It is important not to miss your dose.  Call your doctor or health care professional if you are unable to keep an appointment. What may interact with this medication? Do not take this medicine with any of the following medications: other medicines containing denosumab This medicine may also interact with the following medications: medicines that lower your chance of fighting infection steroid medicines like prednisone or cortisone This list may not describe all possible interactions. Give your health care provider a list of all the medicines, herbs, non-prescription drugs, or dietary supplements you use. Also tell them if you smoke, drink alcohol, or use illegal drugs. Some items may interact with your medicine. What should I watch for while using this medication? Visit your doctor or health care professional for regular checks on your progress. Your doctor or health care professional may order blood tests and other tests to see how you are doing. Call your doctor or health care professional for advice if you get a fever, chills or sore throat, or other symptoms of a cold or flu. Do not treat yourself. This drug may decrease your body's ability to fight infection. Try to avoid being around people who are sick. You should make sure you get enough calcium and vitamin D while you are taking this medicine, unless your doctor tells you not to. Discuss the foods you eat and the vitamins you take with your health care professional. See your dentist regularly. Brush and floss your teeth as directed. Before you have any dental work done, tell your dentist you are receiving this medicine. Do not become pregnant while taking this medicine or for 5 months after   stopping it. Talk with your doctor or health care professional about your birth control options while taking this medicine. Women should inform their doctor if they wish to become pregnant or think they might be pregnant. There is a potential for serious side effects to an unborn child. Talk to  your health care professional or pharmacist for more information. What side effects may I notice from receiving this medication? Side effects that you should report to your doctor or health care professional as soon as possible: allergic reactions like skin rash, itching or hives, swelling of the face, lips, or tongue bone pain breathing problems dizziness jaw pain, especially after dental work redness, blistering, peeling of the skin signs and symptoms of infection like fever or chills; cough; sore throat; pain or trouble passing urine signs of low calcium like fast heartbeat, muscle cramps or muscle pain; pain, tingling, numbness in the hands or feet; seizures unusual bleeding or bruising unusually weak or tired Side effects that usually do not require medical attention (report to your doctor or health care professional if they continue or are bothersome): constipation diarrhea headache joint pain loss of appetite muscle pain runny nose tiredness upset stomach This list may not describe all possible side effects. Call your doctor for medical advice about side effects. You may report side effects to FDA at 1-800-FDA-1088. Where should I keep my medication? This medicine is only given in a clinic, doctor's office, or other health care setting and will not be stored at home. NOTE: This sheet is a summary. It may not cover all possible information. If you have questions about this medicine, talk to your doctor, pharmacist, or health care provider.  2022 Elsevier/Gold Standard (2018-02-17 00:00:00)

## 2021-09-16 NOTE — Progress Notes (Addendum)
[name]  [phone] [phone]) [phone]     ID: [name] DOB: 1956/05/10  MR#: [id number]  QMG#:[id number]  Patient Care Team: Binnie Rail, MD as PCP - General (Internal Medicine) Aloha Gell, MD as Consulting Physician (Obstetrics and Gynecology) Sydnee Levans, MD (Dermatology) Riddick Nuon, Virgie Dad, MD as Consulting Physician (Oncology) Richmond Campbell, MD as Consulting Physician (Gastroenterology) Stefanie Libel, MD as Consulting Physician (Sports Medicine) Eunice Blase, MD (Family Medicine) Erline Levine, MD as Consulting Physician (Neurosurgery) Hetty Blend, Alfredo Bach, MD as Referring Physician (Orthopedic Surgery) Debbra Riding, MD as Consulting Physician (Ophthalmology) Raina Mina, RPH-CPP (Pharmacist) Chauncey Cruel, MD OTHER MD:   CHIEF COMPLAINT: Stage IV lobular breast cancer  CURRENT TREATMENT: capecitabine at metronomic doses   INTERVAL HISTORY: Brittany [name] returns today for follow up of her stage IV lobular breast cancer.   Since her last visit, she underwent total spine MRI on 09/10/2021 showing: transitional lumbosacral anatomy with lumbarization of S1 and a S1-S2 disc space; severe spinal canal and left neural foraminal stenosis at L5-S1 due to combination of dic bulge and severe facet arthrosis with a large synovial cyst arising from left facet, worsened since 10/2020; unchanged diffuse osseous metastatic disease; worsened hyperintense T2-weighted signal within sacroiliac joints.  She also underwent chest CT yesterday, 09/15/2021.  This shows multiple sclerotic lesions but no evidence of visceral disease.  She has been on and off capecitabine since 02/25/2020 at variably low doses due to palmar plantar erythrodysesthesia.  Currently she takes 2 tablets daily on Monday through Friday and then she is off for the weekends.  She has no diarrhea or mouth sores and on this protocol.  However she has again begun to  develop palmar plantar erythrodysesthesia particularly involving the pads of her fingers.  Her feet are less involved.  She is using the sildenafil cream regularly both on her hands and feet   She began Xgeva on 12/13/2019.  Her most recent dose was 08/13/2021.  She tolerates this with no side effects that she is aware of.  We are following her tumor markers, particularly her CEA, which has risen recently Lab Results  Component Value Date   CA2729 45.1 (H) 08/13/2021   CA2729 46.3 (H) 07/22/2021   CA2729 47.4 (H) 06/25/2021   CA2729 37.4 05/25/2021   CA2729 31.6 04/22/2021   Lab Results  Component Value Date   CEA1 7.45 (H) 09/16/2021   CEA1 6.76 (H) 08/13/2021   CEA1 6.26 (H) 07/22/2021   CEA1 5.05 (H) 06/25/2021   CEA1 7.99 (H) 05/25/2021    REVIEW OF SYSTEMS: Brittany does complain of dryness of her skin and little bit of the eyes as well less so the mouth.  She has decreased appetite.  She has 1 or 2 hot flashes most days.  Otherwise she maintains an excellent functional status, is very active with her family.  Currently she is not playing tennis but she managed to play 18 holes of golf recently despite her back problems.  She also does yoga and some cardio.  Recently her trainer became a little bit enthusiastic as she did too much with the left arm and ended up with lymphedema.  We have ordered a compression stocking and obtain the chest CT scan discussed above.  This does not show evidence of new lymphadenopathy to explain the lymphedema.   COVID 19 VACCINATION STATUS: Status post Pfizer x2 with booster August 2021   HISTORY OF CURRENT ILLNESS: From the original intake note:  "  Brittany" [name] has a history of left breast cancer dating back to 60.  She was followed by Dr. Beryle Beams and underwent lumpectomy, adjuvant chemotherapy, radiation and "hormonal therapy".  Per Brittany's recollection the tumor was less than 2 cm, 2 out of 10 lymph nodes were involved, and it was a lobular breast  cancer.  She took tamoxifen for many years and then raloxifene.  Dr. Beryle Beams also evaluated her for leukopenia which was noted at first in 2011, worked up with a negative ANA and negative rheumatoid factors.  He felt it was a benign residual from her earlier chemotherapy.  She was released from follow-up in 2015.  She has continued on intensified screening because of her family history.  On November 23, 2017 she had bilateral breast MRIs which were unremarkable.  Mammography November 20, 2018 showed no findings suspicious of malignancy.  Breast MRI 10/30/2019 showed a new irregular 0.4 cm enhancing focus in the upper inner quadrant of the right breast.  The left breast continued to be unremarkable except for postoperative changes and there were no abnormal appearing lymph nodes.  However in that same MRI multiple areas of enhancement were noted in the sternum.  This had not been seen in the prior MRI.  Plan right MRI biopsy 11/16/2019 was canceled as the previously demonstrated 0.4 cm focus in the right breast showed only a tortuous vein at that location.  However bone scan 11/19/2019 obtained to follow-up on the sternal findings showed additional areas of concern at T12, T7-T11, L4 and ribs, as well as the sternum.  The patient's subsequent history is as detailed below.   PAST MEDICAL HISTORY: Past Medical History:  Diagnosis Date   ANEMIA-NOS    Asymptomatic varicose veins    Bone metastases ([place]) 2021   Breast cancer ([place]) 1996   LEFT BREAST CA   BREAST CANCER, HX OF 01/1995 dx   s/p Lumpectomy, XRT, chemo and 63yrarmidex   Gestational diabetes mellitus in childbirth, diet controlled 1985   Hypothyroid 09/28/2015   Dx 09/2015   Leukopenia    mild since chemo   OSTEOARTHRITIS    Personal history of chemotherapy    Personal history of radiation therapy    SCOLIOSIS, MILD     PAST SURGICAL HISTORY: Past Surgical History:  Procedure Laterality Date   BREAST BIOPSY     BREAST  LUMPECTOMY Left    1996   left lumpectomy  [place]    [facility] [place] [facility] [place] Family History  Problem Relation Age of Onset   Heart disease Father 548      AMI   Hyperlipidemia Father    Valvular heart disease Father 753  Parkinson's disease Father 780  Arthritis Mother    Breast cancer Mother 545  Hyperlipidemia Mother    Osteoporosis Mother    Pancreatic cancer Maternal Grandmother 791      presumed dx   Diabetes Paternal Grandfather        type 2   Breast cancer Maternal Aunt   The patient's father died at the age of 878from heart disease.  He had Lewy body dementia.  The patient's mother is 860years old as of January 2021.  She is in a memory unit.  She has a history of breast cancer diagnosed in her 538s(ductal carcinoma in situ).  The patient's mother sister had breast  cancer diagnosed around age 91.  The patient's mother's mother also had cancer, "internal" (possibly ovarian or gastrointestinal).  There is no cancer on the father's side of the family.   GYNECOLOGIC HISTORY:  No LMP recorded. Patient is postmenopausal. Menarche: 65 years old Age at first live birth: 65 years old [place] P 2 LMP with chemotherapy, in 1996 Contraceptive a little over a year, with no complications HRT no  Hysterectomy?  No Salpingo-oophorectomy?  No   SOCIAL HISTORY:  Brittany [name] graduated from [employer] with a degree in psychology and IT.  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  She and her husband Clair Gulling met while working for [employer].  Note they were in [place] at the time of the [place] disaster and probably were exposed to some radiation at that time.  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  They both finally retired 03/28/2020.  Their daughter Anderson Malta is a Pharmacist, hospital in [place].  Their son Jenny Reichmann works for the same [employer] that the patient worked for.  Note  that Jenny Reichmann did the [employer].  The patient has 1 grandchild (in [place]).  The patient attends [employer]: The patient's husband is her healthcare power of attorney   HEALTH MAINTENANCE: Social History   Tobacco Use   Smoking status: Former    Types: Cigarettes    Quit date: 06/25/1977    Years since quitting: 44.2   Smokeless tobacco: Never  Substance Use Topics   Alcohol use: Yes    Alcohol/week: 3.0 - 4.0 standard drinks    Types: 3 - 4 Glasses of wine per week   Drug use: No     Colonoscopy: 2015  PAP: Up-to-date  Bone density: 11/20/2018, normal   No Known Allergies  Current Outpatient Medications  Medication Sig Dispense Refill   acetaminophen (TYLENOL) 500 MG tablet Take 1 tablet (500 mg total) by mouth in the morning, at noon, and at bedtime. Take with aleve 220 mg 90 tablet 0   baclofen (LIORESAL) 20 MG tablet Take 1 tablet (20 mg total) by mouth 3 (three) times daily as needed for muscle spasms. 90 tablet 3   Calcium Citrate 250 MG TABS Take 4 tablets (1,000 mg total) by mouth daily.  0   capecitabine (XELODA) 500 MG tablet Take 2 tablets (1000 mg) daily Monday through Friday, skip Saturdays and Sundays. 120 tablet 6   Cholecalciferol (VITAMIN D3) 1000 UNITS CAPS Take by mouth daily.     Denosumab (XGEVA [place]) Inject into the skin.     diclofenac sodium (VOLTAREN) 1 % GEL Apply 2 g topically 3 (three) times daily as needed. 100 g 1   gabapentin (NEURONTIN) 300 MG capsule Take 1 capsule (300 mg total) by mouth 3 (three) times daily. 270 capsule 1   levothyroxine (SYNTHROID) 50 MCG tablet Take 1 tablet (50 mcg total) by mouth daily. 90 tablet 3   Multiple Vitamin (MULTIVITAMIN) tablet Take 1 tablet by mouth daily.     naproxen sodium (ALEVE) 220 MG tablet Take 1 tablet (220 mg total) by mouth in the morning, at noon, and at bedtime. Take with tylenol 500 mg 90 tablet 4   Omega-3 Fatty Acids (FISH OIL) 1000 MG CPDR Take by mouth daily.      traMADol (ULTRAM) 50 MG tablet Take 1-2 tablets (50-100 mg total) by mouth every 6 (six) hours as needed. 90 tablet 0  UNABLE TO FIND Med Name : sildenafil 2% cream apply to hands and feet bid prn for CIPN 30 g 3   UNABLE TO FIND Med Name : Sildenafil 2% cream apply to hands and feet bid while taking capecitibine 30 g 3   No current facility-administered medications for this visit.    OBJECTIVE: White woman in no acute distress Vitals:   09/16/21 1119  BP: 133/85  Pulse: 77  Resp: 17  Temp: 98.1 F (36.7 C)  SpO2: 100%      Body mass index is 22.16 kg/m.   Wt Readings from Last 3 Encounters:  09/16/21 141 lb 8 oz (64.2 kg)  09/07/21 143 lb 3.2 oz (65 kg)  09/03/21 140 lb (63.5 kg)     ECOG FS:1 - Symptomatic but completely ambulatory  Sclerae unicteric, EOMs intact Wearing a mask No cervical or supraclavicular adenopathy Lungs no rales or rhonchi Heart regular rate and rhythm Abd soft, nontender, positive bowel sounds MSK no focal spinal tenderness, no upper extremity lymphedema Neuro: nonfocal, well oriented, appropriate affect Breasts: The right breast is benign per the left breast is status postlumpectomy and radiation.  There is no evidence of local recurrence.  Both axillae are benign.   LAB RESULTS:  CMP     Component Value Date/Time   NA 131 (L) 09/16/2021 1058   NA 139 12/10/2013 0841   K 4.6 09/16/2021 1058   K 4.5 12/10/2013 0841   CL 98 09/16/2021 1058   CL 102 12/01/2012 0955   CO2 25 09/16/2021 1058   CO2 28 12/10/2013 0841   GLUCOSE 91 09/16/2021 1058   GLUCOSE 81 12/10/2013 0841   GLUCOSE 88 12/01/2012 0955   BUN 13 09/16/2021 1058   BUN 14 08/04/2015 0000   BUN 17.5 12/10/2013 0841   CREATININE 0.81 09/16/2021 1058   CREATININE 0.9 12/10/2013 0841   CALCIUM 9.1 09/16/2021 1058   CALCIUM 9.6 12/10/2013 0841   PROT 7.1 09/16/2021 1058   PROT 6.7 12/10/2013 0841   ALBUMIN 3.9 09/16/2021 1058   ALBUMIN 3.9 12/10/2013 0841   AST 37  09/16/2021 1058   AST 22 12/10/2013 0841   ALT 24 09/16/2021 1058   ALT 27 12/10/2013 0841   ALKPHOS 69 09/16/2021 1058   ALKPHOS 54 12/10/2013 0841   BILITOT 0.3 09/16/2021 1058   BILITOT 0.41 12/10/2013 0841   GFRNONAA >60 09/16/2021 1058   GFRAA >60 07/02/2020 1324    No results found for: TOTALPROTELP, ALBUMINELP, A1GS, A2GS, BETS, BETA2SER, GAMS, MSPIKE, SPEI  No results found for: Nils Pyle, [facility]  Lab Results  Component Value Date   WBC 5.7 09/16/2021   NEUTROABS 2.6 09/16/2021   HGB 9.2 (L) 09/16/2021   HCT 27.4 (L) 09/16/2021   MCV 107.5 (H) 09/16/2021   PLT 166 09/16/2021      Chemistry      Component Value Date/Time   NA 131 (L) 09/16/2021 1058   NA 139 12/10/2013 0841   K 4.6 09/16/2021 1058   K 4.5 12/10/2013 0841   CL 98 09/16/2021 1058   CL 102 12/01/2012 0955   CO2 25 09/16/2021 1058   CO2 28 12/10/2013 0841   BUN 13 09/16/2021 1058   BUN 14 08/04/2015 0000   BUN 17.5 12/10/2013 0841   CREATININE 0.81 09/16/2021 1058   CREATININE 0.9 12/10/2013 0841   GLU 102 08/04/2015 0000      Component Value Date/Time   CALCIUM 9.1 09/16/2021 1058   CALCIUM 9.6 12/10/2013 0841  ALKPHOS 69 09/16/2021 1058   ALKPHOS 54 12/10/2013 0841   AST 37 09/16/2021 1058   AST 22 12/10/2013 0841   ALT 24 09/16/2021 1058   ALT 27 12/10/2013 0841   BILITOT 0.3 09/16/2021 1058   BILITOT 0.41 12/10/2013 0841      No results found for: LABCA2  No components found for: [id number]  No results for input(s): INR in the last 168 hours.  No results found for: LABCA2  No results found for: CAN199  Lab Results  Component Value Date   GYB638 21.1 11/23/2019    No results found for: LHT342  Lab Results  Component Value Date   CA2729 45.1 (H) 08/13/2021    No components found for: HGQUANT  Lab Results  Component Value Date   CEA1 7.45 (H) 09/16/2021   /  CEA ([facility])  Date Value Ref Range Status  09/16/2021 7.45 (H) 0.00 - 5.00  ng/mL Final    Comment:    (NOTE) This test was performed using [employer]'s Chemiluminescent Microparticle Immunoassay. Values obtained from different assay methods cannot be used interchangeably. Please note that 5-10% of patients who smoke may see CEA levels up to 6.9 ng/mL. Performed at [facility], [place] [address] [place], [place] [address]      No results found for: AFPTUMOR  No results found for: CHROMOGRNA   No results found for: HGBA, HGBA2QUANT, HGBFQUANT, HGBSQUAN (Hemoglobinopathy evaluation)   Lab Results  Component Value Date   LDH 193 12/10/2013    No results found for: IRON, TIBC, IRONPCTSAT (Iron and TIBC)  No results found for: FERRITIN  Urinalysis    Component Value Date/Time   COLORURINE YELLOW 09/17/2014 [place] 09/17/2014 0824   LABSPEC 1.015 09/17/2014 0824   PHURINE 7.5 09/17/2014 [place] 09/17/2014 0824   HGBUR NEGATIVE 09/17/2014 0824   BILIRUBINUR NEGATIVE 09/17/2014 0824   KETONESUR NEGATIVE 09/17/2014 0824   UROBILINOGEN 0.2 09/17/2014 0824   NITRITE NEGATIVE 09/17/2014 0824   LEUKOCYTESUR NEGATIVE 09/17/2014 0824    STUDIES: CT Chest W Contrast  Result Date: 09/16/2021 CLINICAL DATA:  Restaging breast cancer. EXAM: CT CHEST WITH CONTRAST TECHNIQUE: Multidetector CT imaging of the chest was performed during intravenous contrast administration. CONTRAST:  22m OMNIPAQUE IOHEXOL 350 MG/ML SOLN COMPARISON:  PET-CT 01/11/2020 and CT cap 11/28/2019 and MRI total spine 09/10/2021. FINDINGS: Cardiovascular: Heart size is normal. No pericardial effusion. Aberrant right subclavian artery. Mediastinum/Nodes: No enlarged mediastinal, hilar, or axillary lymph nodes. Thyroid gland, trachea, and esophagus demonstrate no significant findings. Lungs/Pleura: No pleural effusion, airspace consolidation, or atelectasis. No suspicious pulmonary nodules identified. There is mild, nonspecific pleural  nodularity and thickening overlying both lungs which is a stable finding compared with 11/28/2019. Upper Abdomen: No acute abnormality. Musculoskeletal: Extensive diffuse sclerotic bone metastases are again noted throughout the visualized bony thorax, cervical and lumbar spine. Compared with the most recent CT from 11/28/2019 there is been marked interval progression of the extent abnormal sclerotic bone lesions. Also, see report from MRI of the spine dated 09/10/2021. There are degenerative changes involving the left shoulder with signs of moderate to large joint effusion with multiple loose bodies within the axillary recess, image 31/2. Postoperative changes within the lateral left breast with surgical clips are again noted. IMPRESSION: 1. No acute cardiopulmonary abnormalities. No suspicious pulmonary nodules or adenopathy identified. 2. Extensive diffuse sclerotic bone metastases are again noted throughout the visualized bony thorax, cervical and lumbar spine. Compared with  the CT from 11/28/2019 there has been marked interval progression of the extent of abnormal sclerotic bone metastases. 09/10/2021 MRI of the spine demonstrates stable diffuse osseous metastatic disease when compared with 11/01/2020. 3. Left shoulder degenerative changes with signs of moderate to large joint effusion with multiple loose bodies within the axillary recess. Electronically Signed   By: Kerby Moors M.D.   On: 09/16/2021 07:41   MR TOTAL SPINE METS SCREENING  Result Date: 09/10/2021 CLINICAL DATA:  Breast cancer metastatic disease follow-up EXAM: MRI TOTAL SPINE WITHOUT AND WITH CONTRAST TECHNIQUE: Multisequence MR imaging of the spine from the cervical spine to the sacrum was performed prior to and following IV contrast administration for evaluation of spinal metastatic disease. CONTRAST:  55m GADAVIST GADOBUTROL 1 MMOL/ML IV SOLN COMPARISON:  Spine MRI 11/01/2020 FINDINGS: MRI CERVICAL SPINE FINDINGS Alignment: Normal  Vertebrae: There is diffuse, predominantly sclerotic metastatic disease throughout the cervical spine. The appearance is unchanged. No compression fracture. Cord: Normal signal and morphology. Posterior Fossa, vertebral arteries, paraspinal tissues: Unremarkable Disc levels: Unchanged moderate spinal canal stenosis at C5-6 and mild spinal canal stenosis at C6-7. MRI THORACIC SPINE FINDINGS Alignment:  Physiologic. Vertebrae: Multilevel midthoracic vertebral body height loss is unchanged. There is diffuse sclerotic metastatic disease again demonstrated. Cord:  Normal signal and morphology. Paraspinal and other soft tissues: Unremarkable Disc levels: No spinal canal stenosis. MRI LUMBAR SPINE FINDINGS Segmentation: There is transitional lumbosacral anatomy with lumbarization of S1 and a S1-S2 disc space. Numbering is based on beginning at the C2 level. Alignment:  Grade 1 anterolisthesis at L5-S1 Vertebrae:  Diffuse osseous metastatic disease. Conus medullaris: Extends to the L2-3 level and appears normal. Paraspinal and other soft tissues: Negative Disc levels: L3-4: Small disc bulge without spinal canal or neural foraminal stenosis. L4-5: Small disc bulge and moderate facet hypertrophy. No spinal canal stenosis or neural foraminal stenosis. L5-S1: Severe facet hypertrophy, left-greater-than-right, and intermediate sized disc bulge. There is severe spinal canal stenosis. Additionally, there is a large area of low T2/T1-weighted signal arising from the medial aspect of the left facet joint and measuring approximately 13 x 14 mm. This contributes to spinal canal stenosis, exerts mass effect on multiple nerve [name] in the left subarticular recess and causes severe left foraminal stenosis. Sacrum: There is worsened hyperintense T2-weighted signal at the sacroiliac joints. IMPRESSION: 1. Transitional lumbosacral anatomy with lumbarization of S1 and a S1-S2 disc space. 2. Severe spinal canal and left neural foraminal  stenosis at L5-S1 due to combination of disc bulge and severe facet arthrosis with a large synovial cyst arising from the left facet, which has worsened since 11/01/20. 3. Unchanged diffuse osseous metastatic disease. 4. Worsened hyperintense T2-weighted signal within the sacroiliac joints, which could indicate insufficiency fracture. 5. Unchanged moderate spinal canal stenosis at C5-6 and mild spinal canal stenosis at C6-7. Electronically Signed   By: KUlyses JarredM.D.   On: 09/10/2021 19:57     ELIGIBLE FOR AVAILABLE RESEARCH PROTOCOL: no  ASSESSMENT: 65y.o. [place] woman status post left lumpectomy 1996 for a T1 N1, anatomic stage II invasive lobular breast cancer, estrogen receptor positive  (a) s/p adjuvant chemotherapy with doxorubicin and cyclophosphamide x4  (b) status post adjuvant radiation  (c) status post tamoxifen for 5+ years  (1) benign leukopenia: noted 2011, felt secondary to prior chemotherapy  (2) genetics testing 06/23/2018 through [facility] MultiCancer panel found no deleterious mutations in the 84 genes tested including BRCA 1-2  (a) 2 variants of uncertain significance were fund in  MSH6 [c.1037C>T (p.Ser346Phe) and c.503C>G (p.ALA168Gly)  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, 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 (3) staging studies:  (a) breast MRI 10/30/2019 shows sternal mottling  (b) bone scan 11/19/2019 shows multiple areas of uptake suspicious for metastatic disease  (c) CT of the chest abdomen and pelvis 11/28/2019 shows no visceral disease  (d) tumor markers 11/23/2019 shows a CEA of 18.11, CA 27-29 of 61.2, CA 125 of 21.1  (e) F-18-estradiol PET (cerianna) 01/11/2020 shows multiple sclerotic bone lesions which do not accumulate the estrogen receptor specific radiotracer  (f) bone marrow biopsy 01/25/2020 confirms metastatic breast cancer, gross cystic disease fluid protein positive, but estrogen and progesterone receptor negative.  HER-2/neu was 2+ on immunohistochemistry,  negative by FISH (1.35/1.70).  (g) CARIS confirms triple negative disease, finds a positive androgen receptor at 3+, 70%; and the PIK3CA was indeterminate.  PTEN was positive by IHC at 90%.  PD-L1 was negative.  Genomic LOH was low and MSI was stable with proficient mismatch mismatch repair.  BRCA 1 and 2 were negative.  (4) denosumab/Xgeva started 12/13/2019, repeated monthly  (5) capecitabine started 02/25/2020, with significant plantar dysesthesia developing after 1 week, drug held 03/12/2020  (a) capecitabine restarted 03/25/2020 at 1000 mg in the morning only.  (b) bone scan 07/23/2020 shows no evidence of disease progression   (c) MRI of the total spine and pelvis 11/01/2020 shows multiple bone lesions but no epidural encroachment, no compression fracture and no evidence for lymphangitic spread of disease; there is significant degenerative disease  (d) repeat bone scan 01/13/2021 difficult to interpret, no definitive progression  (e) capecitabine was held as of 01/30/2021 with significant palmar plantar erythrodysesthesia  (f) capecitabine resumed 03/10/2021 at a lower dose (1 g every other day).  (g) dose increased to 1 g daily Monday through Friday, off on weekends as of 04/28/2021 (h) capecitabine held after 09/16/2021 to allow time for PPED to recover (i) other options include CMF, sacituzumab govitecan, bicalutamide   PLAN: Brittany [name] is coming up on 2 years from her diagnosis of metastatic breast cancer.  Her disease is generally well controlled.  The apparent "increase in bone lesions" on her recent CT scan is due to her having received denosumab in the interval and the MRI which is more accurate shows no evidence of disease progression.  The problem is that while her cancer does respond very nicely to capecitabine even at very low doses she also develops palmar plantar erythrodysesthesia and this is a limiting factor.  Right now with the holidays coming in her needing to take care of of  the baby in [place], meaning a lot of handwashing, I think we need to hold the capecitabine and then reassess when she returns in January.  Certainly we have other options and those can be discussed at that time.  Total encounter time 25 minutes.Sarajane Jews C. , MD 09/16/21 4:47 PM Medical Oncology and Hematology [facility] [place], [place] [address] Tel. [phone]    Fax. [phone]   I, Wilburn Mylar, am acting as scribe for Dr. Virgie Dad. .  I, Lurline Del MD, have reviewed the above documentation for accuracy and completeness, and I agree with the above.    *Total Encounter Time as defined by the Centers for Medicare and Medicaid Services includes, in addition to the face-to-face time of a patient visit (documented in the note above) non-face-to-face time: obtaining and reviewing outside history, ordering and reviewing medications, tests or procedures, care coordination (communications with  other health care professionals or caregivers) and documentation in the medical record.

## 2021-09-17 LAB — CANCER ANTIGEN 27.29: CA 27.29: 39.8 U/mL — ABNORMAL HIGH (ref 0.0–38.6)

## 2021-09-22 ENCOUNTER — Ambulatory Visit: Payer: Medicare Other | Admitting: Physical Therapy

## 2021-09-22 ENCOUNTER — Other Ambulatory Visit: Payer: Self-pay

## 2021-09-22 ENCOUNTER — Encounter: Payer: Self-pay | Admitting: Physical Therapy

## 2021-09-22 DIAGNOSIS — I890 Lymphedema, not elsewhere classified: Secondary | ICD-10-CM | POA: Diagnosis not present

## 2021-09-22 DIAGNOSIS — R293 Abnormal posture: Secondary | ICD-10-CM

## 2021-09-22 NOTE — Patient Instructions (Signed)

## 2021-09-22 NOTE — Therapy (Signed)
[facility] @ [place], [place], [address] Phone: [phone]   Fax:  [phone]  Physical Therapy Treatment  Patient Details  Name: Brittany [name] MRN: [id number] Date of Birth: 10-Oct-1956 Referring Provider (PT): Wilber Bihari   Encounter Date: 09/22/2021   PT End of Session - 09/22/21 1501     Visit Number 3    Number of Visits 17    Date for PT Re-Evaluation 11/11/21    PT Start Time 1406    PT Stop Time 1500    PT Time Calculation (min) 54 min    Activity Tolerance Patient tolerated treatment well    Behavior During Therapy [facility] for tasks assessed/performed             Past Medical History:  Diagnosis Date   ANEMIA-NOS    Asymptomatic varicose veins    Bone metastases ([place]) 2021   Breast cancer ([place]) 1996   LEFT BREAST CA   BREAST CANCER, HX OF 01/1995 dx   s/p Lumpectomy, XRT, chemo and 77yr armidex   Gestational diabetes mellitus in childbirth, diet controlled 1985   Hypothyroid 09/28/2015   Dx 09/2015   Leukopenia    mild since chemo   OSTEOARTHRITIS    Personal history of chemotherapy    Personal history of radiation therapy    SCOLIOSIS, MILD     Past Surgical History:  Procedure Laterality Date   BREAST BIOPSY     BREAST LUMPECTOMY Left    1996   left lumpectomy  [place]     2002 & 2003-Dr. vail @ [place]    There were no vitals filed for this visit.   Subjective Assessment - 09/22/21 1409     Subjective The swelling may be slightly better or the same. It has not completely gone away.    Patient Stated Goals for the lymphedema to go away    Currently in Pain? Yes    Pain Score 2     Pain Location Shoulder    Pain Orientation Left    Pain Descriptors / Indicators Aching    Pain Type Chronic pain    Pain Onset More than a month ago    Aggravating Factors  lying on L side, later in day    Pain Relieving Factors rest    Effect of  Pain on Daily Activities hard to lift things up high                    L-DEX FLOWSHEETS - 09/22/21 1400       L-DEX LYMPHEDEMA SCREENING   Measurement Type Unilateral    L-DEX MEASUREMENT EXTREMITY Upper Extremity    POSITION  Standing    DOMINANT SIDE Right    At Risk Side Left    BASELINE SCORE (UNILATERAL) 29.4                       [facility] Adult PT Treatment/Exercise - 09/22/21 0001       Manual Therapy   Manual Lymphatic Drainage (MLD) in supine: short neck, 5 diaphragmatic breaths, right axillary nodes and establishment of interaxillary pathway, left inguinal nodes and establishment of axillo inguinal pathway, LUE working proximal to distal then retracing all steps while verbally instructing pt in anatomy and physiology of the lymphatic system and proper skin stretch, had pt return demonstrate entire sequence while therapist provided appropriate verbal  and tactile cues for correct skin stretch and sequence                       PT Short Term Goals - 09/11/21 0921       PT SHORT TERM GOAL #1   Title Pt will be independent in self manual lymph drainage    Time 4    Status New               PT Long Term Goals - 09/11/21 3536       PT LONG TERM GOAL #1   Title Pt will have reduction of 1 cm at 10 cm proximal to olecranon of left arm.    Baseline 27.5 cm on 09/11/2021    Time 8    Period Weeks    Status New      PT LONG TERM GOAL #2   Title Pt will be able to verbalize routine for lymphedema prophylaxis and management of lymphedema at home.    Time 8    Period Weeks    Status New                   Plan - 09/22/21 1503     Clinical Impression Statement Instructed pt in self MLD today and had pt return demonstrate entire sequence. Provided mod verbal cues for correct skin stretch and sequence. Issued handout for pt to follow at home. She is still awaiting arrival of her compression sleeve and glove. Took baseline  SOZO measurements today and will reassess this as pt progresses in therapy to see if there is any change. Her baseline was out of normal range today possibly due to increased edema.    PT Frequency 2x / week    PT Duration 8 weeks    PT Treatment/Interventions ADLs/Self Care Home Management;Therapeutic exercise;Patient/family education;Orthotic Fit/Training;Manual techniques;Manual lymph drainage;Compression bandaging;Passive range of motion;Vasopneumatic Device;Taping    PT Next Visit Plan reassess SOZO prior to D/C - does not need to continue for 2 years just once more before d/c - Perform and teach self MLD .    Consulted and Agree with Plan of Care Patient             Patient will benefit from skilled therapeutic intervention in order to improve the following deficits and impairments:  Impaired UE functional use, Increased edema, Impaired flexibility, Postural dysfunction, Increased fascial restricitons, Decreased strength, Decreased range of motion  Visit Diagnosis: Lymphedema, not elsewhere classified  Abnormal posture     Problem List 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   Malignant neoplasm of central portion of left breast in female, estrogen receptor positive ([place]) 11/22/2019   Bone metastases ([place]) 11/22/2019   Anatomical narrow angle glaucoma 09/25/2018   History of gestational diabetes 09/21/2017   Raynaud phenomenon 09/21/2016   History of total replacement of both hip joints 01/12/2016   Left rotator cuff tear 10/07/2015   Hypothyroid 09/28/2015   Leukopenia 12/08/2012   Metatarsalgia of both feet 09/21/2011   CERVICALGIA 05/04/2010   Asymptomatic varicose veins 03/17/2009   Osteoarthritis 03/17/2009   SCOLIOSIS, MILD 03/17/2009    Manus Gunning, PT 09/22/2021, 3:05 PM  [facility] @ [place], [place], [address] Phone: [phone]   Fax:  [phone]  Name: Brittany [name] MRN: [id number] Date of Birth: [birth year]  Manus Gunning, PT 09/22/21 3:05 PM

## 2021-09-24 ENCOUNTER — Ambulatory Visit: Payer: Medicare Other | Attending: Adult Health

## 2021-09-24 ENCOUNTER — Telehealth: Payer: Self-pay | Admitting: Oncology

## 2021-09-24 ENCOUNTER — Other Ambulatory Visit: Payer: Self-pay

## 2021-09-24 DIAGNOSIS — I890 Lymphedema, not elsewhere classified: Secondary | ICD-10-CM | POA: Insufficient documentation

## 2021-09-24 DIAGNOSIS — R293 Abnormal posture: Secondary | ICD-10-CM | POA: Insufficient documentation

## 2021-09-24 NOTE — Patient Instructions (Signed)

## 2021-09-24 NOTE — Telephone Encounter (Signed)
Sch per 11/23 los, pt aware 

## 2021-09-24 NOTE — Therapy (Signed)
[facility] @ [place], [place], [address] Phone: [phone]   Fax:  [phone]  Physical Therapy Treatment  Patient Details  Name: Brittany [name] MRN: [id number] Date of Birth: 10-27-1955 Referring Provider (PT): Wilber Bihari   Encounter Date: 09/24/2021   PT End of Session - 09/24/21 0958     Visit Number 4    Number of Visits 17    Date for PT Re-Evaluation 11/11/21    PT Start Time 0904    PT Stop Time 0958    PT Time Calculation (min) 54 min    Activity Tolerance Patient tolerated treatment well    Behavior During Therapy [facility] for tasks assessed/performed             Past Medical History:  Diagnosis Date   ANEMIA-NOS    Asymptomatic varicose veins    Bone metastases ([place]) 2021   Breast cancer ([place]) 1996   LEFT BREAST CA   BREAST CANCER, HX OF 01/1995 dx   s/p Lumpectomy, XRT, chemo and 64yr armidex   Gestational diabetes mellitus in childbirth, diet controlled 1985   Hypothyroid 09/28/2015   Dx 09/2015   Leukopenia    mild since chemo   OSTEOARTHRITIS    Personal history of chemotherapy    Personal history of radiation therapy    SCOLIOSIS, MILD     Past Surgical History:  Procedure Laterality Date   BREAST BIOPSY     BREAST LUMPECTOMY Left    1996   left lumpectomy  [place]     2002 & 2003-Dr. vail @ [place]    There were no vitals filed for this visit.   Subjective Assessment - 09/24/21 0916     Subjective I've been doing the self MLD and I can tell it's helping as my arm isn't as swollen. My Lt shoulder just bothers me so much from the arthritis.    Patient Stated Goals for the lymphedema to go away    Currently in Pain? No/denies   used voltarin this morning                              [facility] Adult PT Treatment/Exercise - 09/24/21 0001       Shoulder Exercises: Pulleys   Flexion 2 minutes    Flexion  Limitations Tactile and VCs throughout but pt unable to fully relax/stop comepnsations so had increased pain so stopped due to this      Shoulder Exercises: Isometric Strengthening   Flexion 5X5"    Flexion Limitations in doorway for Lt shoulder for each isometrics    Extension 5X5"    External Rotation 5X5"    ABduction 5X5"      Manual Therapy   Manual Therapy Manual Lymphatic Drainage (MLD);Passive ROM    Manual Lymphatic Drainage (MLD) in supine: short neck, 5 diaphragmatic breaths, right axillary nodes and establishment of anterior interaxillary pathway, left inguinal nodes and establishment of Lt axillo inguinal pathway, LUE working proximal to distal then retracing all steps and reviewing with pt while performing    Passive ROM In Supine briefly to Lt shoulder into flexion with scapular depression throughout                     PT Education - 09/24/21 0920     Education Details Isometrics for Lt shoulder  Person(s) Educated Patient    Methods Explanation;Demonstration;Handout    Comprehension Verbalized understanding;Returned demonstration              PT Short Term Goals - 09/11/21 0921       PT SHORT TERM GOAL #1   Title Pt will be independent in self manual lymph drainage    Time 4    Status New               PT Long Term Goals - 09/11/21 1601       PT LONG TERM GOAL #1   Title Pt will have reduction of 1 cm at 10 cm proximal to olecranon of left arm.    Baseline 27.5 cm on 09/11/2021    Time 8    Period Weeks    Status New      PT LONG TERM GOAL #2   Title Pt will be able to verbalize routine for lymphedema prophylaxis and management of lymphedema at home.    Time 8    Period Weeks    Status New                   Plan - 09/24/21 0959     Clinical Impression Statement Worked on trying to progress pt to pulleys to work on decreasing her Lt scapular compensations but this was very challenging for her and she had increased  pain so stopped. Then added isometrics for Lt shoulder which she tolerated well without increaesd pain and proper technique so added these to her HEP. Then continued with MLD to Lt Ue reviewing with pt while perfomring. Pt was engaged and asking apprpriate questions during session about sequence and anatomy of lymphatic system. Pt is returns good demo of technique as well. Her new compression garments have yet to arrive.    Comorbidities metastatic breast CA-stable lesions, stenosis, OA,  limitation in left shoulder with crepitus    Examination-Activity Limitations Reach Overhead    Stability/Clinical Decision Making Stable/Uncomplicated    Rehab Potential Good    PT Frequency 2x / week    PT Duration 8 weeks    PT Treatment/Interventions ADLs/Self Care Home Management;Therapeutic exercise;Patient/family education;Orthotic Fit/Training;Manual techniques;Manual lymph drainage;Compression bandaging;Passive range of motion;Vasopneumatic Device;Taping    PT Next Visit Plan Cont and review self MLD to Lt UE; review isometrics and issue yellow theraband for pt to progress to with her current HEP at home    PT Home Exercise Plan self MLD, Lt shoulder isometrics    Consulted and Agree with Plan of Care Patient             Patient will benefit from skilled therapeutic intervention in order to improve the following deficits and impairments:  Impaired UE functional use, Increased edema, Impaired flexibility, Postural dysfunction, Increased fascial restricitons, Decreased strength, Decreased range of motion  Visit Diagnosis: Lymphedema, not elsewhere classified  Abnormal posture     Problem List 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   Malignant neoplasm of central portion of left breast in female, estrogen receptor positive ([place]) 11/22/2019    Bone metastases ([place]) 11/22/2019   Anatomical narrow angle glaucoma 09/25/2018   History of gestational diabetes 09/21/2017   Raynaud phenomenon 09/21/2016   History of total replacement of both hip joints 01/12/2016   Left rotator cuff tear 10/07/2015  Hypothyroid 09/28/2015   Leukopenia 12/08/2012   Metatarsalgia of both feet 09/21/2011   CERVICALGIA 05/04/2010   Asymptomatic varicose veins 03/17/2009   Osteoarthritis 03/17/2009   SCOLIOSIS, MILD 03/17/2009    Otelia Limes, PTA 09/24/2021, 10:05 AM  [facility] @ [place], [place], [address] Phone: [phone]   Fax:  [phone]  Name: GAVRIELA CASHIN MRN: [id number] Date of Birth: [birth year]

## 2021-09-28 ENCOUNTER — Other Ambulatory Visit: Payer: Self-pay | Admitting: Oncology

## 2021-09-29 ENCOUNTER — Ambulatory Visit: Payer: Medicare Other | Admitting: Physical Therapy

## 2021-09-29 ENCOUNTER — Other Ambulatory Visit: Payer: Self-pay

## 2021-09-29 ENCOUNTER — Encounter: Payer: Self-pay | Admitting: Oncology

## 2021-09-29 ENCOUNTER — Encounter: Payer: Self-pay | Admitting: Physical Therapy

## 2021-09-29 DIAGNOSIS — R293 Abnormal posture: Secondary | ICD-10-CM

## 2021-09-29 DIAGNOSIS — I890 Lymphedema, not elsewhere classified: Secondary | ICD-10-CM | POA: Diagnosis not present

## 2021-09-29 NOTE — Therapy (Signed)
[facility] @ [address] [place], [place], [address] Phone: [phone]   Fax:  [phone]  Physical Therapy Treatment  Patient Details  Name: Brittany [name] MRN: [id number] Date of Birth: [birth year] Referring Provider (PT): Wilber Bihari   Encounter Date: 09/29/2021   PT End of Session - 09/29/21 1458     Visit Number 5    Number of Visits 17    Date for PT Re-Evaluation 11/11/21    PT Start Time 3893    PT Stop Time 7342    PT Time Calculation (min) 51 min    Activity Tolerance Patient tolerated treatment well    Behavior During Therapy [facility] for tasks assessed/performed             Past Medical History:  Diagnosis Date   ANEMIA-NOS    Asymptomatic varicose veins    Bone metastases ([place]) 2021   Breast cancer ([place]) 1996   LEFT BREAST CA   BREAST CANCER, HX OF 01/1995 dx   s/p Lumpectomy, XRT, chemo and 36yr armidex   Gestational diabetes mellitus in childbirth, diet controlled 1985   Hypothyroid 09/28/2015   Dx 09/2015   Leukopenia    mild since chemo   OSTEOARTHRITIS    Personal history of chemotherapy    Personal history of radiation therapy    SCOLIOSIS, MILD     Past Surgical History:  Procedure Laterality Date   BREAST BIOPSY     BREAST LUMPECTOMY Left    1996   left lumpectomy  [place]     2002 & 2003-Dr. vail @ [place]    There were no vitals filed for this visit.   Subjective Assessment - 09/29/21 1403     Subjective My shoulder is starting to feel pretty good so I feel like I am ready to start doing the isometrics at home.    Patient Stated Goals for the lymphedema to go away    Currently in Pain? No/denies    Pain Score 0-No pain                               [facility] Adult PT Treatment/Exercise - 09/29/21 0001       Manual Therapy   Edema Management Pt received her exo strong glove and sleeve. Assessed sleeve for fit and  it fit well. Pt did not bring her glove but she reports discomfort at her thumb. remeasured pt and she fits well in to a size small so educated pt to stick a thick marker in to the thumb to stretch it a little and to see if that helps.    Manual Lymphatic Drainage (MLD) in supine: short neck, 5 diaphragmatic breaths, right axillary nodes and establishment of anterior interaxillary pathway, left inguinal nodes and establishment of Lt axillo inguinal pathway, LUE working proximal to distal then retracing all steps                       PT Short Term Goals - 09/11/21 0921       PT SHORT TERM GOAL #1   Title Pt will be independent in self manual lymph drainage    Time 4    Status New               PT Long Term Goals - 09/11/21 8768  PT LONG TERM GOAL #1   Title Pt will have reduction of 1 cm at 10 cm proximal to olecranon of left arm.    Baseline 27.5 cm on 09/11/2021    Time 8    Period Weeks    Status New      PT LONG TERM GOAL #2   Title Pt will be able to verbalize routine for lymphedema prophylaxis and management of lymphedema at home.    Time 8    Period Weeks    Status New                   Plan - 09/29/21 1501     Clinical Impression Statement Assessed sleeve for proper fit. Educated pt to place a thick marker in the thumb of the glove since this is uncomfortable for her. Continued with MLD to LUE and pt re donned sleeve at end of session. Pt will be flying to [place] in a week and a half. Pt reports she feels comfortable with the exercises issued at last session.    PT Frequency 2x / week    PT Duration 8 weeks    PT Treatment/Interventions ADLs/Self Care Home Management;Therapeutic exercise;Patient/family education;Orthotic Fit/Training;Manual techniques;Manual lymph drainage;Compression bandaging;Passive range of motion;Vasopneumatic Device;Taping    PT Next Visit Plan Cont and review self MLD to Lt UE; review isometrics and issue yellow  theraband for pt to progress to with her current HEP at home    PT Home Exercise Plan self MLD, Lt shoulder isometrics    Consulted and Agree with Plan of Care Patient             Patient will benefit from skilled therapeutic intervention in order to improve the following deficits and impairments:  Impaired UE functional use, Increased edema, Impaired flexibility, Postural dysfunction, Increased fascial restricitons, Decreased strength, Decreased range of motion  Visit Diagnosis: Lymphedema, not elsewhere classified  Abnormal posture     Problem List 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   Malignant neoplasm of central portion of left breast in female, estrogen receptor positive ([place]) 11/22/2019   Bone metastases ([place]) 11/22/2019   Anatomical narrow angle glaucoma 09/25/2018   History of gestational diabetes 09/21/2017   Raynaud phenomenon 09/21/2016   History of total replacement of both hip joints 01/12/2016   Left rotator cuff tear 10/07/2015   Hypothyroid 09/28/2015   Leukopenia 12/08/2012   Metatarsalgia of both feet 09/21/2011   CERVICALGIA 05/04/2010   Asymptomatic varicose veins 03/17/2009   Osteoarthritis 03/17/2009   SCOLIOSIS, MILD 03/17/2009    Manus Gunning, PT 09/29/2021, 3:02 PM  [facility] @ [address] [place], [place], [address] Phone: [phone]   Fax:  [phone]  Name: Brittany [name] MRN: [id number] Date of Birth: 27-Jan-1956  Manus Gunning, PT 09/29/21 3:02 PM

## 2021-09-30 ENCOUNTER — Other Ambulatory Visit: Payer: Self-pay | Admitting: *Deleted

## 2021-09-30 ENCOUNTER — Encounter: Payer: Self-pay | Admitting: Oncology

## 2021-09-30 MED ORDER — GABAPENTIN 300 MG PO CAPS
300.0000 mg | ORAL_CAPSULE | Freq: Three times a day (TID) | ORAL | 1 refills | Status: DC
Start: 2021-09-30 — End: 2022-03-24

## 2021-10-01 ENCOUNTER — Ambulatory Visit: Payer: Medicare Other | Admitting: Physical Therapy

## 2021-10-02 ENCOUNTER — Ambulatory Visit: Payer: Medicare Other

## 2021-10-02 ENCOUNTER — Other Ambulatory Visit: Payer: Self-pay

## 2021-10-02 DIAGNOSIS — I890 Lymphedema, not elsewhere classified: Secondary | ICD-10-CM | POA: Diagnosis not present

## 2021-10-02 DIAGNOSIS — R293 Abnormal posture: Secondary | ICD-10-CM

## 2021-10-02 NOTE — Patient Instructions (Signed)

## 2021-10-02 NOTE — Therapy (Signed)
[facility] @ [place], [place], [address] Phone: [phone]   Fax:  [phone]  Physical Therapy Treatment  Patient Details  Name: Brittany [name] MRN: [id number] Date of Birth: 09-Jul-1956 Referring Provider (PT): Wilber Bihari   Encounter Date: 10/02/2021   PT End of Session - 10/02/21 0826     Visit Number 6    Number of Visits 17    Date for PT Re-Evaluation 11/11/21    PT Start Time 0804    PT Stop Time 0900    PT Time Calculation (min) 56 min    Activity Tolerance Patient tolerated treatment well    Behavior During Therapy [facility] for tasks assessed/performed             Past Medical History:  Diagnosis Date   ANEMIA-NOS    Asymptomatic varicose veins    Bone metastases ([place]) 2021   Breast cancer ([place]) 1996   LEFT BREAST CA   BREAST CANCER, HX OF 01/1995 dx   s/p Lumpectomy, XRT, chemo and 70yr armidex   Gestational diabetes mellitus in childbirth, diet controlled 1985   Hypothyroid 09/28/2015   Dx 09/2015   Leukopenia    mild since chemo   OSTEOARTHRITIS    Personal history of chemotherapy    Personal history of radiation therapy    SCOLIOSIS, MILD     Past Surgical History:  Procedure Laterality Date   BREAST BIOPSY     BREAST LUMPECTOMY Left    1996   left lumpectomy  [place]     2002 & 2003-Dr. vail @ [facility]    There were no vitals filed for this visit.   Subjective Assessment - 10/02/21 0814     Subjective I'm wearing my compression sleeve and that is helping alot. My swelling has reduced since wearing it. I can only wear the glove for a few hours at a time though because my thumb gets numb. I'm working on trying to wear it a little more each day. My Lt shoulder overall is doing better as well. I'm starting to think the lymphedema was making my shoulder hurt because my arm was heavier.    Patient Stated Goals for the lymphedema to go away     Currently in Pain? No/denies                               [facility] Adult PT Treatment/Exercise - 10/02/21 0001       Self-Care   Other Self-Care Comments  Answered pts questions about best time to ear her compression sleeve and then glove as this becomes increasingly uncomfortable during the day.      Shoulder Exercises: Supine   Horizontal ABduction Strengthening;Right;Left;10 reps;Theraband    Theraband Level (Shoulder Horizontal ABduction) Level 1 (Yellow)    Horizontal ABduction Limitations Pt returned therapist demo for each scapular series    External Rotation Strengthening;Both;10 reps;Theraband    Theraband Level (Shoulder External Rotation) Level 1 (Yellow)    Flexion Strengthening;Both;10 reps;Theraband   Narrow and Wide Grip, 10x each   Theraband Level (Shoulder Flexion) Level 1 (Yellow)    Diagonals Strengthening;Right;Left;10 reps;Theraband    Theraband Level (Shoulder Diagonals) Level 1 (Yellow)      Manual Therapy   Manual Lymphatic Drainage (MLD) in supine: short neck, 5 diaphragmatic breaths, right axillary nodes and establishment of anterior interaxillary  pathway, left inguinal nodes and establishment of Lt axillo inguinal pathway, LUE working proximal to distal then retracing all steps                     PT Education - 10/02/21 0825     Education Details Supine scapular series with yellow theraband    Person(s) Educated Patient    Methods Explanation;Demonstration;Handout    Comprehension Verbalized understanding;Returned demonstration              PT Short Term Goals - 09/11/21 0921       PT SHORT TERM GOAL #1   Title Pt will be independent in self manual lymph drainage    Time 4    Status New               PT Long Term Goals - 09/11/21 6387       PT LONG TERM GOAL #1   Title Pt will have reduction of 1 cm at 10 cm proximal to olecranon of left arm.    Baseline 27.5 cm on 09/11/2021    Time 8    Period  Weeks    Status New      PT LONG TERM GOAL #2   Title Pt will be able to verbalize routine for lymphedema prophylaxis and management of lymphedema at home.    Time 8    Period Weeks    Status New                   Plan - 10/02/21 0901     Clinical Impression Statement Overall pt is doing well with her progress at this time. She has and is daily wearing hre new compression sleeve, glove for a few hours as tolerated. She is performing her self MLD daily and reviewed this today with her. Also progressed her HEP to include supine scapular series as she tolerated this well, though was encouraged not to push into pain. She was able to verbalize good understanding. Also education of "low and slow" progressing with resistance and/or weights with lymphedema.    Comorbidities metastatic breast CA-stable lesions, stenosis, OA,  limitation in left shoulder with crepitus    Examination-Activity Limitations Reach Overhead    Stability/Clinical Decision Making Stable/Uncomplicated    Rehab Potential Good    PT Frequency 2x / week    PT Duration 8 weeks    PT Treatment/Interventions ADLs/Self Care Home Management;Therapeutic exercise;Patient/family education;Orthotic Fit/Training;Manual techniques;Manual lymph drainage;Compression bandaging;Passive range of motion;Vasopneumatic Device;Taping    PT Next Visit Plan Cont and review self MLD to Lt UE; review supine scapular series prn    PT Home Exercise Plan self MLD, Lt shoulder isometrics; supine scapular series    Consulted and Agree with Plan of Care Patient             Patient will benefit from skilled therapeutic intervention in order to improve the following deficits and impairments:  Impaired UE functional use, Increased edema, Impaired flexibility, Postural dysfunction, Increased fascial restricitons, Decreased strength, Decreased range of motion  Visit Diagnosis: Lymphedema, not elsewhere classified  Abnormal  posture     Problem List 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   Malignant neoplasm of central portion of left breast in female, estrogen receptor positive ([place]) 11/22/2019   Bone metastases ([place]) 11/22/2019  Anatomical narrow angle glaucoma 09/25/2018   History of gestational diabetes 09/21/2017   Raynaud phenomenon 09/21/2016   History of total replacement of both hip joints 01/12/2016   Left rotator cuff tear 10/07/2015   Hypothyroid 09/28/2015   Leukopenia 12/08/2012   Metatarsalgia of both feet 09/21/2011   CERVICALGIA 05/04/2010   Asymptomatic varicose veins 03/17/2009   Osteoarthritis 03/17/2009   SCOLIOSIS, MILD 03/17/2009    Otelia Limes, PTA 10/02/2021, 9:04 AM  [facility] @ [place], [place], [address] Phone: [phone]   Fax:  [phone]  Name: DEASHIA SOULE MRN: [id number] Date of Birth: 1956/02/01

## 2021-10-06 ENCOUNTER — Other Ambulatory Visit: Payer: Self-pay

## 2021-10-06 ENCOUNTER — Encounter: Payer: Self-pay | Admitting: Physical Therapy

## 2021-10-06 ENCOUNTER — Ambulatory Visit: Payer: Medicare Other | Admitting: Physical Therapy

## 2021-10-06 DIAGNOSIS — I890 Lymphedema, not elsewhere classified: Secondary | ICD-10-CM

## 2021-10-06 DIAGNOSIS — R293 Abnormal posture: Secondary | ICD-10-CM

## 2021-10-06 NOTE — Therapy (Signed)
[facility] @ [place], [place], [address] Phone: [phone]   Fax:  [phone]  Physical Therapy Treatment  Patient Details  Name: Brittany [name] MRN: [id number] Date of Birth: 11-Jul-1956 Referring Provider (PT): Wilber Bihari   Encounter Date: 10/06/2021   PT End of Session - 10/06/21 1000     Visit Number 7    Number of Visits 17    Date for PT Re-Evaluation 11/11/21    PT Start Time 0905    PT Stop Time 0952    PT Time Calculation (min) 47 min    Activity Tolerance Patient tolerated treatment well    Behavior During Therapy [facility] for tasks assessed/performed             Past Medical History:  Diagnosis Date   ANEMIA-NOS    Asymptomatic varicose veins    Bone metastases ([place]) 2021   Breast cancer ([place]) 1996   LEFT BREAST CA   BREAST CANCER, HX OF 01/1995 dx   s/p Lumpectomy, XRT, chemo and 30yrarmidex   Gestational diabetes mellitus in childbirth, diet controlled 1985   Hypothyroid 09/28/2015   Dx 09/2015   Leukopenia    mild since chemo   OSTEOARTHRITIS    Personal history of chemotherapy    Personal history of radiation therapy    SCOLIOSIS, MILD     Past Surgical History:  Procedure Laterality Date   BREAST BIOPSY     BREAST LUMPECTOMY Left    1996   left lumpectomy  [place]    2002 & [place] [place]   There were no vitals filed for this visit.   Subjective Assessment - 10/06/21 0905     Subjective The exercises are going well. I don't have any questions. The thumb is still going numb in the glove but I only tried to stretch it once.    Patient Stated Goals for the lymphedema to go away    Currently in Pain? No/denies    Pain Score 0-No pain                   LYMPHEDEMA/ONCOLOGY QUESTIONNAIRE - 10/06/21 0001       Left Upper Extremity Lymphedema   10 cm Proximal to Olecranon Process 25.7 cm    Olecranon Process  25 cm    15 cm Proximal to Ulnar Styloid Process 21 cm    Just Proximal to Ulnar Styloid Process 14.9 cm    Across Hand at [employer]17.2 cm    At [place] 2nd Digit 5.8 cm                        [facility] Adult PT Treatment/Exercise - 10/06/21 0001       Exercises   Exercises Shoulder      Shoulder Exercises: Standing   Other Standing Exercises Using dual cable cross machine on 3 lbs x 10 reps each: flexion walkouts, extension walkouts and ER walkouts with v/c to keep arm at side and not move arm throughout exercise as well as keep shoulders back and engaged, pec stretches on power plate x 60 sec bilat      Manual Therapy   Edema Management had pt don sleeve at end of session in correct fashion so seam was at back of elbow    Manual Lymphatic Drainage (MLD) in supine:  short neck, 5 diaphragmatic breaths, right axillary nodes and establishment of anterior interaxillary pathway, left inguinal nodes and establishment of Lt axillo inguinal pathway, LUE working proximal to distal then retracing all steps                       PT Short Term Goals - 09/11/21 0921       PT SHORT TERM GOAL #1   Title Pt will be independent in self manual lymph drainage    Time 4    Status New               PT Long Term Goals - 10/06/21 0913       PT LONG TERM GOAL #1   Title Pt will have reduction of 1 cm at 10 cm proximal to olecranon of left arm.    Baseline 27.5 cm on 09/11/2021    Time 8    Period Weeks    Status New      PT LONG TERM GOAL #2   Title Pt will be able to verbalize routine for lymphedema prophylaxis and management of lymphedema at home.    Baseline met at this point but requires further progresion to meet goals.    Time 8    Period Weeks    Status New      PT LONG TERM GOAL #3   Title Pt will be independent in a home exercise program for stretching and strengthening of her L shoulder.    Time 4    Period Weeks    Status New                    Plan - 10/06/21 1001     Clinical Impression Statement Began walkouts on cable cross machine to strengthen pt's rotator cuff in pain free range. Pt felt fatigued by the end of each set but did not have increased pain. She has been doing supine scap exercises at home and has not had any pain. She pays close attention to her form during exercises. educated pt how to don sleeve correctly so the seam runs along posterior arm. Continued with MLD. CIrcumferential measurements taken today demonstate a decrease in edema.    PT Frequency 2x / week    PT Duration 8 weeks    PT Treatment/Interventions ADLs/Self Care Home Management;Therapeutic exercise;Patient/family education;Orthotic Fit/Training;Manual techniques;Manual lymph drainage;Compression bandaging;Passive range of motion;Vasopneumatic Device;Taping    PT Next Visit Plan Cont and review self MLD to Lt UE; review supine scapular series prn, walkouts on cable cross, give rockwood    PT Home Exercise Plan self MLD, Lt shoulder isometrics; supine scapular series    Consulted and Agree with Plan of Care Patient             Patient will benefit from skilled therapeutic intervention in order to improve the following deficits and impairments:  Impaired UE functional use, Increased edema, Impaired flexibility, Postural dysfunction, Increased fascial restricitons, Decreased strength, Decreased range of motion  Visit Diagnosis: Lymphedema, not elsewhere classified  Abnormal posture     Problem List 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   Malignant neoplasm of central portion of left breast in female, estrogen receptor positive ([place]) 11/22/2019   Bone metastases ([place]) 11/22/2019   Anatomical narrow angle glaucoma 09/25/2018   History  of gestational diabetes 09/21/2017    Raynaud phenomenon 09/21/2016   History of total replacement of both hip joints 01/12/2016   Left rotator cuff tear 10/07/2015   Hypothyroid 09/28/2015   Leukopenia 12/08/2012   Metatarsalgia of both feet 09/21/2011   CERVICALGIA 05/04/2010   Asymptomatic varicose veins 03/17/2009   Osteoarthritis 03/17/2009   SCOLIOSIS, MILD 03/17/2009    Manus Gunning, PT 10/06/2021, 10:04 AM  [facility] @ [place], [place], [address] Phone: [phone]   Fax:  [phone]  Name: Brittany [name] MRN: [id number] Date of Birth: April 25, 1956   Manus Gunning, PT 10/06/21 10:04 AM

## 2021-10-08 ENCOUNTER — Encounter: Payer: Self-pay | Admitting: Oncology

## 2021-10-08 ENCOUNTER — Ambulatory Visit: Payer: Medicare Other

## 2021-10-08 ENCOUNTER — Other Ambulatory Visit: Payer: Self-pay

## 2021-10-08 ENCOUNTER — Encounter: Payer: Self-pay | Admitting: Internal Medicine

## 2021-10-08 DIAGNOSIS — R293 Abnormal posture: Secondary | ICD-10-CM

## 2021-10-08 DIAGNOSIS — I890 Lymphedema, not elsewhere classified: Secondary | ICD-10-CM | POA: Diagnosis not present

## 2021-10-08 NOTE — Therapy (Signed)
[facility] @ [place], [place], [address] Phone: [phone]   Fax:  [phone]  Physical Therapy Treatment  Patient Details  Name: Brittany [name] MRN: [id number] Date of Birth: 1956-10-17 Referring Provider (PT): Wilber Bihari   Encounter Date: 10/08/2021   PT End of Session - 10/08/21 0934     Visit Number 8    Number of Visits 17    Date for PT Re-Evaluation 11/11/21    PT Start Time 0906    PT Stop Time 1000    PT Time Calculation (min) 54 min    Activity Tolerance Patient tolerated treatment well    Behavior During Therapy [facility] for tasks assessed/performed             Past Medical History:  Diagnosis Date   ANEMIA-NOS    Asymptomatic varicose veins    Bone metastases ([place]) 2021   Breast cancer ([place]) 1996   LEFT BREAST CA   BREAST CANCER, HX OF 01/1995 dx   s/p Lumpectomy, XRT, chemo and 13yrarmidex   Gestational diabetes mellitus in childbirth, diet controlled 1985   Hypothyroid 09/28/2015   Dx 09/2015   Leukopenia    mild since chemo   OSTEOARTHRITIS    Personal history of chemotherapy    Personal history of radiation therapy    SCOLIOSIS, MILD     Past Surgical History:  Procedure Laterality Date   BREAST BIOPSY     BREAST LUMPECTOMY Left    1996   left lumpectomy  1[place]    2002 & [facility] [place] [facility]   There were no vitals filed for this visit.   Subjective Assessment - 10/08/21 0911     Subjective I felt good after last session. Just some muscle fatigue.    Pertinent History In 1996 Lt breast lumpectomy, adjuvant chemotherapy, radiation and "hormonal therapy". 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  She took tamoxifen for many years and then raloxifene; Breast MRI 10/30/2019 showed a new irregular 0.4 cm enhancing focus in the upper inner  quadrant of the right breast andbone scan 11/19/2019 obtained to follow-up on the sternal findings showed additional areas of concern at T12, T7-T11, L4 and ribs, as well as the sternum and most recent scan on 09/10/21 showed unchanged diffuse osseous metastatic disease    Patient Stated Goals for the lymphedema to go away    Currently in Pain? No/denies                               [facility]08[facility] 0001       Shoulder Exercises: Standing   Other Standing Exercises Using dual cable cross machine on 3 lbs x 10 reps each: flexion walkouts, extension walkouts and ER walkouts with v/c to keep arm at side and not move arm throughout exercise as well as keep shoulders back and engaged, pec stretches on power plate x 60 sec bilat    Other Standing Exercises Wall push ups x12      Manual Therapy   Edema Management assisted pt with donning sleeve after session and showed her use of donning gloves to adjust fit once on    Manual Lymphatic Drainage (MLD) in supine: short  neck, 5 diaphragmatic breaths, right axillary nodes and establishment of anterior interaxillary pathway, left inguinal nodes and establishment of Lt axillo inguinal pathway, LUE working proximal to distal then retracing all steps                       PT Short Term Goals - 09/11/21 0921       PT SHORT TERM GOAL #1   Title Pt will be independent in self manual lymph drainage    Time 4    Status New               PT Long Term Goals - 10/06/21 0913       PT LONG TERM GOAL #1   Title Pt will have reduction of 1 cm at 10 cm proximal to olecranon of left arm.    Baseline 27.5 cm on 09/11/2021    Time 8    Period Weeks    Status New      PT LONG TERM GOAL #2   Title Pt will be able to verbalize routine for lymphedema prophylaxis and management of lymphedema at home.    Baseline met at this point but requires further progresion to meet goals.    Time 8     Period Weeks    Status New      PT LONG TERM GOAL #3   Title Pt will be independent in a home exercise program for stretching and strengthening of her L shoulder.    Time 4    Period Weeks    Status New                   Plan - 10/08/21 1017     Clinical Impression Statement Pt has made great progres a tthis time and will be placed on hold until afte she returns from [place] the [place]. She has one follow up visit to reassess at that time.    Comorbidities metastatic breast CA-stable lesions, stenosis, OA,  limitation in left shoulder with crepitus    Examination-Activity Limitations Reach Overhead    Rehab Potential Good    PT Frequency 2x / week    PT Duration 8 weeks    PT Treatment/Interventions ADLs/Self Care Home Management;Therapeutic exercise;Patient/family education;Orthotic Fit/Training;Manual techniques;Manual lymph drainage;Compression bandaging;Passive range of motion;Vasopneumatic Device;Taping    PT Next Visit Plan Reasses when pt returns from trip from the holidays; Cont and review self MLD to Lt UE; review supine scapular series prn, walkouts on cable cross, give rockwood    PT Home Exercise Plan self MLD, Lt shoulder isometrics; supine scapular series    Consulted and Agree with Plan of Care Patient             Patient will benefit from skilled therapeutic intervention in order to improve the following deficits and impairments:  Impaired UE functional use, Increased edema, Impaired flexibility, Postural dysfunction, Increased fascial restricitons, Decreased strength, Decreased range of motion  Visit Diagnosis: Lymphedema, not elsewhere classified  Abnormal posture     Problem List 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   Malignant neoplasm of central portion of left breast in  female, estrogen receptor positive ([place]) 11/22/2019   Bone metastases ([place]) 11/22/2019   Anatomical narrow angle glaucoma 09/25/2018   History of gestational diabetes 09/21/2017  Raynaud phenomenon 09/21/2016   History of total replacement of both hip joints 01/12/2016   Left rotator cuff tear 10/07/2015   Hypothyroid 09/28/2015   Leukopenia 12/08/2012   Metatarsalgia of both feet 09/21/2011   CERVICALGIA 05/04/2010   Asymptomatic varicose veins 03/17/2009   Osteoarthritis 03/17/2009   SCOLIOSIS, MILD 03/17/2009    Otelia Limes, PTA 10/08/2021, 10:36 AM  [facility] @ [place], [place], [address] Phone: [phone]   Fax:  [phone]  Name: Brittany [name] MRN: [id number] Date of Birth: April 08, 1956

## 2021-10-08 NOTE — Progress Notes (Signed)
Subjective:    Patient ID: Brittany [name], female    DOB: 01-04-1956, 65 y.o.   MRN: [id number]   This visit occurred during the SARS-CoV-2 public health emergency.  Safety protocols were in place, including screening questions prior to the visit, additional usage of staff PPE, and extensive cleaning of exam room while observing appropriate contact time as indicated for disinfecting solutions.    HPI She is here for a physical exam.   She can do most acitivites.  She has occ back pain, occ shooting pain.  She takes gabapnetin bid with naproxen twice daily.  She is doing fitness training.  She does have significant arthritis in the back and a synovial cyst.  Has been recommended that she have surgery.  She does meet with a neurosurgeon at [facility] in February to discuss possible surgery and when and if that should be done.  Medications and allergies reviewed with patient and updated if appropriate.  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   Malignant neoplasm of central portion of left breast in female, estrogen receptor positive ([place]) 11/22/2019   Bone metastases ([place]) 11/22/2019   Anatomical narrow angle glaucoma 09/25/2018   History of gestational diabetes 09/21/2017   Raynaud phenomenon 09/21/2016   History of total replacement of both hip joints 01/12/2016   Left rotator cuff tear 10/07/2015   Hypothyroid 09/28/2015   Leukopenia 12/08/2012   Metatarsalgia of both feet 09/21/2011   CERVICALGIA 05/04/2010   Asymptomatic varicose veins 03/17/2009   Osteoarthritis 03/17/2009   SCOLIOSIS, MILD 03/17/2009    Current Outpatient Medications on File Prior to Visit  Medication Sig Dispense Refill   acetaminophen (TYLENOL) 500 MG tablet Take 1 tablet (500 mg total) by mouth in the morning, at noon, and at bedtime. Take with aleve  220 mg 90 tablet 0   Calcium Citrate 250 MG TABS Take 4 tablets (1,000 mg total) by mouth daily.  0   capecitabine (XELODA) 500 MG tablet Take 2 tablets (1000 mg) daily Monday through Friday, skip Saturdays and Sundays. 120 tablet 6   Cholecalciferol (VITAMIN D3) 1000 UNITS CAPS Take by mouth daily.     Denosumab (XGEVA [place]) Inject into the skin.     diclofenac sodium (VOLTAREN) 1 % GEL Apply 2 g topically 3 (three) times daily as needed. 100 g 1   gabapentin (NEURONTIN) 300 MG capsule Take 1 capsule (300 mg total) by mouth 3 (three) times daily. 270 capsule 1   levothyroxine (SYNTHROID) 50 MCG tablet Take 1 tablet (50 mcg total) by mouth daily. 90 tablet 3   Multiple Vitamin (MULTIVITAMIN) tablet Take 1 tablet by mouth daily.     naproxen sodium (ALEVE) 220 MG tablet Take 1 tablet (220 mg total) by mouth in the morning, at noon, and at bedtime. Take with tylenol 500 mg 90 tablet 4   Omega-3 Fatty Acids (FISH OIL) 1000 MG CPDR Take by mouth daily.     UNABLE TO FIND Med Name : sildenafil 2% cream apply to hands and feet bid prn for CIPN 30 g 3   UNABLE TO FIND Med Name : Sildenafil 2% cream apply to hands and feet bid while taking capecitibine 30 g 3   No current facility-administered medications on file prior to visit.    Past Medical History:  Diagnosis Date   ANEMIA-NOS  Asymptomatic varicose veins    Bone metastases ([place]) 2021   Breast cancer ([place]) 1996   LEFT BREAST CA   BREAST CANCER, HX OF 01/1995 dx   s/p Lumpectomy, XRT, chemo and 27yr armidex   Gestational diabetes mellitus in childbirth, diet controlled 1985   Hypothyroid 09/28/2015   Dx 09/2015   Leukopenia    mild since chemo   OSTEOARTHRITIS    Personal history of chemotherapy    Personal history of radiation therapy    SCOLIOSIS, MILD     Past Surgical History:  Procedure Laterality Date   BREAST BIOPSY     BREAST LUMPECTOMY Left    1996   left lumpectomy  [place]      2002 & 2003-Dr. vail @ [place] History   Socioeconomic History   Marital status: Married    Spouse name: Not on file   Number of children: Not on file   Years of education: Not on file   Highest education level: Not on file  Occupational History   Not on file  Tobacco Use   Smoking status: Former    Types: Cigarettes    Quit date: 06/25/1977    Years since quitting: 44.3   Smokeless tobacco: Never  Substance and Sexual Activity   Alcohol use: Yes    Alcohol/week: 3.0 - 4.0 standard drinks    Types: 3 - 4 Glasses of wine per week   Drug use: No   Sexual activity: Not on file  Other Topics Concern   Not on file  Social History Narrative   Married-lives with spouse & son. Art gallery manager, enjoys golf, sports, bridge, swim, and walking dog   Social Determinants of Health   Financial Resource Strain: Low Risk    Difficulty of Paying Living Expenses: Not hard at all  Food Insecurity: No Food Insecurity   Worried About Charity fundraiser in the Last Year: Never true   Arboriculturist in the Last Year: Never true  Transportation Needs: No Transportation Needs   Lack of Transportation (Medical): No   Lack of Transportation (Non-Medical): No  Physical Activity: Sufficiently Active   Days of Exercise per Week: 7 days   Minutes of Exercise per Session: 60 min  Stress: No Stress Concern Present   Feeling of Stress : Not at all  Social Connections: Socially Integrated   Frequency of Communication with Friends and Family: Three times a week   Frequency of Social Gatherings with Friends and Family: Three times a week   Attends Religious Services: More than 4 times per year   Active Member of Clubs or Organizations: Yes   Attends Music therapist: More than 4 times per year   Marital Status: Married    Family History  Problem Relation Age of Onset   Heart disease Father 73       AMI   Hyperlipidemia Father    Valvular heart disease Father 27    Parkinson's disease Father 17   Arthritis Mother    Breast cancer Mother 83   Hyperlipidemia Mother    Osteoporosis Mother    Pancreatic cancer Maternal Grandmother 73       presumed dx   Diabetes Paternal Grandfather        type 2   Breast cancer Maternal Aunt     Review of Systems  Constitutional:  Negative for chills and fever.  Eyes:  Negative for visual disturbance.  Respiratory:  Negative for cough, shortness of breath and wheezing.   Cardiovascular:  Positive for leg swelling. Negative for chest pain and palpitations.  Gastrointestinal:  Negative for abdominal pain, blood in stool, constipation, diarrhea and nausea.       No gerd  Genitourinary:  Negative for dysuria.  Musculoskeletal:  Positive for back pain. Negative for arthralgias (left shoulder).  Skin:  Negative for rash.  Neurological:  Negative for dizziness, light-headedness and headaches.  Psychiatric/Behavioral:  Negative for dysphoric mood and sleep disturbance. The patient is not nervous/anxious.       Objective:   Vitals:   10/09/21 1404  BP: 130/72  Pulse: 77  Temp: 98.1 F (36.7 C)  SpO2: 99%   Filed Weights   10/09/21 1404  Weight: 144 lb (65.3 kg)   Body mass index is 22.55 kg/m.  BP Readings from Last 3 Encounters:  10/09/21 130/72  09/16/21 133/85  09/07/21 134/82    Wt Readings from Last 3 Encounters:  10/09/21 144 lb (65.3 kg)  09/16/21 141 lb 8 oz (64.2 kg)  09/07/21 143 lb 3.2 oz (65 kg)    Depression screen [facility] 2/9 05/05/2021 10/07/2020 09/28/2019 09/21/2017  Decreased Interest 0 0 0 0  Down, Depressed, Hopeless 0 0 0 0  PHQ - 2 Score 0 0 0 0  Some recent data might be hidden     No flowsheet data found.     Physical Exam Constitutional: She appears well-developed and well-nourished. No distress.  HENT:  Head: Normocephalic and atraumatic.  Right Ear: External ear normal. Normal ear canal and TM Left Ear: External ear normal.  Normal ear canal and TM Mouth/Throat:  Oropharynx is clear and moist.  Eyes: Conjunctivae and EOM are normal.  Neck: Neck supple. No tracheal deviation present. No thyromegaly present.  No carotid bruit  Cardiovascular: Normal rate, regular rhythm and normal heart sounds.   No murmur heard.  No edema. Pulmonary/Chest: Effort normal and breath sounds normal. No respiratory distress. She has no wheezes. She has no rales.  Breast: deferred   Abdominal: Soft. She exhibits no distension. There is no tenderness.  Lymphadenopathy: She has no cervical adenopathy.  Skin: Skin is warm and dry. She is not diaphoretic.  Psychiatric: She has a normal mood and affect. Her behavior is normal.     Lab Results  Component Value Date   WBC 5.7 09/16/2021   HGB 9.2 (L) 09/16/2021   HCT 27.4 (L) 09/16/2021   PLT 166 09/16/2021   GLUCOSE 91 09/16/2021   CHOL 170 09/28/2019   TRIG 44.0 09/28/2019   HDL 81.10 09/28/2019   LDLCALC 80 09/28/2019   ALT 24 09/16/2021   AST 37 09/16/2021   NA 131 (L) 09/16/2021   K 4.6 09/16/2021   CL 98 09/16/2021   CREATININE 0.81 09/16/2021   BUN 13 09/16/2021   CO2 25 09/16/2021   TSH 3.70 09/28/2019   HGBA1C 5.4 09/28/2019         Assessment & Plan:   Physical exam: Screening blood work  ordered-TSH only, which she will try to get done at the cancer center Exercise regular Weight normal Substance abuse  none   Reviewed recommended immunizations.   Health Maintenance  Topic Date Due   MAMMOGRAM  11/28/2022   DEXA SCAN  11/21/2023   TETANUS/TDAP  09/16/2024   COLONOSCOPY (Pts 45-23yrs Insurance coverage will need to be confirmed)  12/09/2024   Pneumonia Vaccine 55+ Years old  Completed   INFLUENZA VACCINE  Completed   COVID-19 Vaccine  Completed   Hepatitis C Screening  Completed   Zoster Vaccines- Shingrix  Completed   HPV VACCINES  Aged Out          See Problem List for Assessment and Plan of chronic medical problems.

## 2021-10-08 NOTE — Patient Instructions (Addendum)
Medications changes include :   none    Please followup in 1 year   Health Maintenance, Female Adopting a healthy lifestyle and getting preventive care are important in promoting health and wellness. Ask your health care provider about: The right schedule for you to have regular tests and exams. Things you can do on your own to prevent diseases and keep yourself healthy. What should I know about diet, weight, and exercise? Eat a healthy diet  Eat a diet that includes plenty of vegetables, fruits, low-fat dairy products, and lean protein. Do not eat a lot of foods that are high in solid fats, added sugars, or sodium. Maintain a healthy weight Body mass index (BMI) is used to identify weight problems. It estimates body fat based on height and weight. Your health care provider can help determine your BMI and help you achieve or maintain a healthy weight. Get regular exercise Get regular exercise. This is one of the most important things you can do for your health. Most adults should: Exercise for at least 150 minutes each week. The exercise should increase your heart rate and make you sweat (moderate-intensity exercise). Do strengthening exercises at least twice a week. This is in addition to the moderate-intensity exercise. Spend less time sitting. Even light physical activity can be beneficial. Watch cholesterol and blood lipids Have your blood tested for lipids and cholesterol at 67 years of age, then have this test every 5 years. Have your cholesterol levels checked more often if: Your lipid or cholesterol levels are high. You are older than 67 years of age. You are at high risk for heart disease. What should I know about cancer screening? Depending on your health history and family history, you may need to have cancer screening at various ages. This may include screening for: Breast cancer. Cervical cancer. Colorectal cancer. Skin cancer. Lung cancer. What should I know  about heart disease, diabetes, and high blood pressure? Blood pressure and heart disease High blood pressure causes heart disease and increases the risk of stroke. This is more likely to develop in people who have high blood pressure readings or are overweight. Have your blood pressure checked: Every 3-5 years if you are 50-40 years of age. Every year if you are 68 years old or older. Diabetes Have regular diabetes screenings. This checks your fasting blood sugar level. Have the screening done: Once every three years after age 53 if you are at a normal weight and have a low risk for diabetes. More often and at a younger age if you are overweight or have a high risk for diabetes. What should I know about preventing infection? Hepatitis B If you have a higher risk for hepatitis B, you should be screened for this virus. Talk with your health care provider to find out if you are at risk for hepatitis B infection. Hepatitis C Testing is recommended for: Everyone born from [birth year] through 1967. Anyone with known risk factors for hepatitis C. Sexually transmitted infections (STIs) Get screened for STIs, including gonorrhea and chlamydia, if: You are sexually active and are younger than 67 years of age. You are older than 67 years of age and your health care provider tells you that you are at risk for this type of infection. Your sexual activity has changed since you were last screened, and you are at increased risk for chlamydia or gonorrhea. Ask your health care provider if you are at risk. Ask your health care provider about  whether you are at high risk for HIV. Your health care provider may recommend a prescription medicine to help prevent HIV infection. If you choose to take medicine to prevent HIV, you should first get tested for HIV. You should then be tested every 3 months for as long as you are taking the medicine. Pregnancy If you are about to stop having your period (premenopausal) and you  may become pregnant, seek counseling before you get pregnant. Take 400 to 800 micrograms (mcg) of folic acid every day if you become pregnant. Ask for birth control (contraception) if you want to prevent pregnancy. Osteoporosis and menopause Osteoporosis is a disease in which the bones lose minerals and strength with aging. This can result in bone fractures. If you are 61 years old or older, or if you are at risk for osteoporosis and fractures, ask your health care provider if you should: Be screened for bone loss. Take a calcium or vitamin D supplement to lower your risk of fractures. Be given hormone replacement therapy (HRT) to treat symptoms of menopause. Follow these instructions at home: Alcohol use Do not drink alcohol if: Your health care provider tells you not to drink. You are pregnant, may be pregnant, or are planning to become pregnant. If you drink alcohol: Limit how much you have to: 0-1 drink a day. Know how much alcohol is in your drink. In the [place] one drink equals one 12 oz bottle of beer (355 mL), one 5 oz glass of wine (148 mL), or one 1 oz glass of hard liquor (44 mL). Lifestyle Do not use any products that contain nicotine or tobacco. These products include cigarettes, chewing tobacco, and vaping devices, such as e-cigarettes. If you need help quitting, ask your health care provider. Do not use street drugs. Do not share needles. Ask your health care provider for help if you need support or information about quitting drugs. General instructions Schedule regular health, dental, and eye exams. Stay current with your vaccines. Tell your health care provider if: You often feel depressed. You have ever been abused or do not feel safe at home. Summary Adopting a healthy lifestyle and getting preventive care are important in promoting health and wellness. Follow your health care provider's instructions about healthy diet, exercising, and getting tested or screened for  diseases. Follow your health care provider's instructions on monitoring your cholesterol and blood pressure. This information is not intended to replace advice given to you by your health care provider. Make sure you discuss any questions you have with your health care provider. Document Revised: 03/02/2021 Document Reviewed: 03/02/2021 Elsevier Patient Education  [place].

## 2021-10-09 ENCOUNTER — Ambulatory Visit (INDEPENDENT_AMBULATORY_CARE_PROVIDER_SITE_OTHER): Payer: Medicare Other | Admitting: Internal Medicine

## 2021-10-09 VITALS — BP 130/72 | HR 77 | Temp 98.1°F | Ht 67.0 in | Wt 144.0 lb

## 2021-10-09 DIAGNOSIS — E039 Hypothyroidism, unspecified: Secondary | ICD-10-CM

## 2021-10-09 DIAGNOSIS — C7951 Secondary malignant neoplasm of bone: Secondary | ICD-10-CM

## 2021-10-09 DIAGNOSIS — M5416 Radiculopathy, lumbar region: Secondary | ICD-10-CM | POA: Diagnosis not present

## 2021-10-09 DIAGNOSIS — Z0000 Encounter for general adult medical examination without abnormal findings: Secondary | ICD-10-CM | POA: Diagnosis not present

## 2021-10-09 NOTE — Assessment & Plan Note (Signed)
Chronic  Clinically euthyroid Currently taking levothyroxine 50 mcg daily Check tsh  Titrate med dose if needed  

## 2021-10-09 NOTE — Assessment & Plan Note (Signed)
Chronic Metastatic breast cancer Following with oncology Currently on Xeloda and tolerating it well Disease has remained stable on treatment

## 2021-10-09 NOTE — Assessment & Plan Note (Signed)
Chronic She has significant arthritis in her lower back and a synovial cyst with intermittent radiculopathy Pain controlled with gabapentin 300 mg twice daily and Aleve twice daily May be considering surgery at [facility] with neurosurgeon in February Currently pain is tolerable and she is able to do all of her activities she wants to

## 2021-10-13 ENCOUNTER — Encounter: Payer: Medicare Other | Admitting: Physical Therapy

## 2021-10-14 ENCOUNTER — Other Ambulatory Visit: Payer: Medicare Other

## 2021-10-14 ENCOUNTER — Ambulatory Visit: Payer: Medicare Other

## 2021-10-15 ENCOUNTER — Encounter: Payer: Medicare Other | Admitting: Physical Therapy

## 2021-10-27 NOTE — Progress Notes (Signed)
[place]  [phone] [phone]     ID: Brittany [name] DOB: Jan 08, 1956  MR#: [id number]  SNK#:[id number]  Patient Care Team: Binnie Rail, MD as PCP - General (Internal Medicine) Aloha Gell, MD as Consulting Physician (Obstetrics and Gynecology) Sydnee Levans, MD (Dermatology) Magrinat, Virgie Dad, MD as Consulting Physician (Oncology) Richmond Campbell, MD as Consulting Physician (Gastroenterology) Stefanie Libel, MD as Consulting Physician (Sports Medicine) Eunice Blase, MD (Inactive) (Family Medicine) Erline Levine, MD as Consulting Physician (Neurosurgery) Hetty Blend, Alfredo Bach, MD as Referring Physician (Orthopedic Surgery) Debbra Riding, MD as Consulting Physician (Ophthalmology) Raina Mina, RPH-CPP (Pharmacist) Benay Pike, MD OTHER MD:   CHIEF COMPLAINT: Stage IV lobular breast cancer  CURRENT TREATMENT: capecitabine at metronomic doses   INTERVAL HISTORY: Brittany [name] returns today for follow up of her stage IV lobular breast cancer.   She has been on and off capecitabine since 02/25/2020 at variably low doses due to palmar plantar erythrodysesthesia.  Currently she takes 2 tablets daily on Monday through Friday and then she is off for the weekends.  She has no diarrhea or mouth sores and on this protocol.  Sildenafil cream has been working very well for her palmar plantar dysesthesia.She began [place] on 12/13/2019.  She continues on this every 28 days. She tolerates this with no side effects that she is aware of. No other new bone pains, change in breathing, bowel habits or urinary habits.  No new neurological complaints. Rest of the pertinent 10 point ROS reviewed and negative.   Lab Results  Component Value Date   CA2729 39.8 (H) 09/16/2021   CA2729 45.1 (H) 08/13/2021   CA2729 46.3 (H) 07/22/2021   CA2729 47.4 (H) 06/25/2021   CA2729 37.4 05/25/2021   Lab Results  Component Value Date   CEA1 7.45 (H)  09/16/2021   CEA1 6.76 (H) 08/13/2021   CEA1 6.26 (H) 07/22/2021   CEA1 5.05 (H) 06/25/2021   CEA1 7.99 (H) 05/25/2021    REVIEW OF SYSTEMS:    COVID 19 VACCINATION STATUS: Status post [place] x2 with booster August 2021   HISTORY OF CURRENT ILLNESS: From the original intake note:  "Brittany" [name] has a history of left breast cancer dating back to 61.  She was followed by Dr. Beryle Beams and underwent lumpectomy, adjuvant chemotherapy, radiation and "hormonal therapy".  Per Brittany's recollection the tumor was less than 2 cm, 2 out of 10 lymph nodes were involved, and it was a lobular breast cancer.  She took tamoxifen for many years and then raloxifene.  Dr. Beryle Beams also evaluated her for leukopenia which was noted at first in 2011, worked up with a negative ANA and negative rheumatoid factors.  He felt it was a benign residual from her earlier chemotherapy.  She was released from follow-up in 2015.  She has continued on intensified screening because of her family history.  On November 23, 2017 she had bilateral breast MRIs which were unremarkable.  Mammography November 20, 2018 showed no findings suspicious of malignancy.  Breast MRI 10/30/2019 showed a new irregular 0.4 cm enhancing focus in the upper inner quadrant of the right breast.  The left breast continued to be unremarkable except for postoperative changes and there were no abnormal appearing lymph nodes.  However in that same MRI multiple areas of enhancement were noted in the sternum.  This had not been seen in the prior MRI.  Plan right MRI biopsy 11/16/2019 was canceled as the previously demonstrated 0.4 cm  focus in the right breast showed only a tortuous vein at that location.  However bone scan 11/19/2019 obtained to follow-up on the sternal findings showed additional areas of concern at T12, T7-T11, L4 and ribs, as well as the sternum.  The patient's subsequent history is as detailed below.   PAST MEDICAL HISTORY: Past  Medical History:  Diagnosis Date   ANEMIA-NOS    Asymptomatic varicose veins    Bone metastases ([place]) 2021   Breast cancer ([place]) 1996   LEFT BREAST CA   BREAST CANCER, HX OF 01/1995 dx   s/p Lumpectomy, XRT, chemo and 19yrarmidex   Gestational diabetes mellitus in childbirth, diet controlled 1985   Hypothyroid 09/28/2015   Dx 09/2015   Leukopenia    mild since chemo   OSTEOARTHRITIS    Personal history of chemotherapy    Personal history of radiation therapy    SCOLIOSIS, MILD     PAST SURGICAL HISTORY: Past Surgical History:  Procedure Laterality Date   BREAST BIOPSY     BREAST LUMPECTOMY Left    1996   left lumpectomy  1[place]    [facility] Family [facility]  Problem Relation Age of Onset   Heart disease Father 548      AMI   Hyperlipidemia Father    Valvular heart disease Father 736  Parkinson's disease Father 750  Arthritis Mother    Breast cancer Mother 517  Hyperlipidemia Mother    Osteoporosis Mother    Pancreatic cancer Maternal Grandmother 740      presumed dx   Diabetes Paternal Grandfather        type 2   Breast cancer Maternal Aunt   The patient's father died at the age of 822from heart disease.  He had Lewy body dementia.  The patient's mother is 859years old as of January 2021.  She is in a memory unit.  She has a history of breast cancer diagnosed in her 582s(ductal carcinoma in situ).  The patient's mother sister had breast cancer diagnosed around age 68  The patient's mother's mother also had cancer, "internal" (possibly ovarian or gastrointestinal).  There is no cancer on the father's side of the family.   GYNECOLOGIC HISTORY:  No LMP recorded. Patient is postmenopausal. Menarche: 66years old Age at first live birth: 66years old [place] with chemotherapy, in 1996 Contraceptive a little over a year, with no complications HRT no  Hysterectomy?   No Salpingo-oophorectomy?  No   SOCIAL HISTORY:  DOzagraduated from [place] a degree in psychology and IT.  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  They both finally retired 03/28/2020.  Their daughter JAnderson Maltais a hPharmacist, hospitalin [place]  Their son JJenny Reichmannworks for the same wRyland Groupthat the patient worked for.  Note that JJenny Reichmanndid the PUnisys Corporation  The patient has 1 grandchild ([place].  The patient attends [employer] [place] The patient's husband is her healthcare power of attorney  HEALTH MAINTENANCE: Social History   Tobacco Use   Smoking status: Former    Types: Cigarettes    Quit date: 06/25/1977    Years since quitting: 44.3   Smokeless tobacco: Never  Substance Use Topics   Alcohol use: Yes    Alcohol/week: 3.0 - 4.0 standard drinks    Types: 3 - 4 Glasses of wine per week   Drug use: No     Colonoscopy: 2015  PAP: Up-to-date  Bone density: 11/20/2018, normal   No Known Allergies  Current Outpatient Medications  Medication Sig Dispense Refill   acetaminophen (TYLENOL) 500 MG tablet Take 1 tablet (500 mg total) by mouth in the morning, at noon, and at bedtime. Take with aleve 220 mg 90 tablet 0   Calcium Citrate 250 MG TABS Take 4 tablets (1,000 mg total) by mouth daily.  0   capecitabine (XELODA) 500 MG tablet Take 2 tablets (1000 mg) daily Monday through Friday, skip Saturdays and Sundays. 120 tablet 6   Cholecalciferol (VITAMIN D3) 1000 UNITS CAPS Take by mouth daily.     Denosumab (XGEVA Pablo) Inject into the skin.     diclofenac sodium (VOLTAREN) 1 % GEL Apply 2 g topically 3 (three) times daily as needed. 100 g 1   gabapentin (NEURONTIN)  300 MG capsule Take 1 capsule (300 mg total) by mouth 3 (three) times daily. 270 capsule 1   levothyroxine (SYNTHROID) 50 MCG tablet Take 1 tablet (50 mcg total) by mouth daily. 90 tablet 3   Multiple Vitamin (MULTIVITAMIN) tablet Take 1 tablet by mouth daily.     naproxen sodium (ALEVE) 220 MG tablet Take 1 tablet (220 mg total) by mouth in the morning, at noon, and at bedtime. Take with tylenol 500 mg 90 tablet 4   Omega-3 Fatty Acids (FISH OIL) 1000 MG CPDR Take by mouth daily.     UNABLE TO FIND Med Name : sildenafil 2% cream apply to hands and feet bid prn for CIPN 30 g 3   UNABLE TO FIND Med Name : Sildenafil 2% cream apply to hands and feet bid while taking capecitibine 30 g 3   No current facility-administered medications for this visit.    OBJECTIVE: White woman in no acute distress There were no vitals filed for this visit.     There is no height or weight on file to calculate BMI.   Wt Readings from Last 3 Encounters:  10/09/21 144 lb (65.3 kg)  09/16/21 141 lb 8 oz (64.2 kg)  09/07/21 143 lb 3.2 oz (65 kg)     ECOG FS:1 - Symptomatic but completely ambulatory  Sclerae unicteric, EOMs intact Wearing a mask No cervical or supraclavicular adenopathy Lungs no rales or rhonchi Heart regular rate and rhythm Abd soft, nontender, positive bowel sounds MSK no focal spinal tenderness, no upper extremity lymphedema Neuro: nonfocal, well oriented, appropriate affect Breasts: The right breast is benign per the left breast is status postlumpectomy and radiation.  There is no evidence of local recurrence.  Both axillae are benign.   LAB RESULTS:  CMP     Component Value Date/Time   NA 131 (L) 09/16/2021 1058   NA 139 12/10/2013 0841   K 4.6 09/16/2021 1058   K 4.5 12/10/2013 0841   CL 98 09/16/2021 1058   CL 102 12/01/2012 0955   CO2 25 09/16/2021 1058   CO2 28 12/10/2013 0841   GLUCOSE 91 09/16/2021 1058   GLUCOSE 81 12/10/2013 0841   GLUCOSE 88  12/01/2012 0955   BUN 13  09/16/2021 1058   BUN 14 08/04/2015 0000   BUN 17.5 12/10/2013 0841   CREATININE 0.81 09/16/2021 1058   CREATININE 0.9 12/10/2013 0841   CALCIUM 9.1 09/16/2021 1058   CALCIUM 9.6 12/10/2013 0841   PROT 7.1 09/16/2021 1058   PROT 6.7 12/10/2013 0841   ALBUMIN 3.9 09/16/2021 1058   ALBUMIN 3.9 12/10/2013 0841   AST 37 09/16/2021 1058   AST 22 12/10/2013 0841   ALT 24 09/16/2021 1058   ALT 27 12/10/2013 0841   ALKPHOS 69 09/16/2021 1058   ALKPHOS 54 12/10/2013 0841   BILITOT 0.3 09/16/2021 1058   BILITOT 0.41 12/10/2013 0841   GFRNONAA >60 09/16/2021 1058   GFRAA >60 07/02/2020 1324    No results found for: Ronnald Ramp, A1GS, A2GS, BETS, BETA2SER, GAMS, MSPIKE, SPEI  No results found for: Nils Pyle, [facility]  Lab Results  Component Value Date   WBC 5.7 09/16/2021   NEUTROABS 2.6 09/16/2021   HGB 9.2 (L) 09/16/2021   HCT 27.4 (L) 09/16/2021   MCV 107.5 (H) 09/16/2021   PLT 166 09/16/2021      Chemistry      Component Value Date/Time   NA 131 (L) 09/16/2021 1058   NA 139 12/10/2013 0841   K 4.6 09/16/2021 1058   K 4.5 12/10/2013 0841   CL 98 09/16/2021 1058   CL 102 12/01/2012 0955   CO2 25 09/16/2021 1058   CO2 28 12/10/2013 0841   BUN 13 09/16/2021 1058   BUN 14 08/04/2015 0000   BUN 17.5 12/10/2013 0841   CREATININE 0.81 09/16/2021 1058   CREATININE 0.9 12/10/2013 0841   GLU 102 08/04/2015 0000      Component Value Date/Time   CALCIUM 9.1 09/16/2021 1058   CALCIUM 9.6 12/10/2013 0841   ALKPHOS 69 09/16/2021 1058   ALKPHOS 54 12/10/2013 0841   AST 37 09/16/2021 1058   AST 22 12/10/2013 0841   ALT 24 09/16/2021 1058   ALT 27 12/10/2013 0841   BILITOT 0.3 09/16/2021 1058   BILITOT 0.41 12/10/2013 0841      No results found for: LABCA2  No components found for: LAGTXM468  No results for input(s): INR in the last 168 hours.  No results found for: LABCA2  No results found for: CAN199  Lab Results  Component Value  Date   EHO122 21.1 11/23/2019    No results found for: QMG500  Lab Results  Component Value Date   CA2729 39.8 (H) 09/16/2021    No components found for: HGQUANT  Lab Results  Component Value Date   CEA1 7.45 (H) 09/16/2021   /  CEA ([facility])  Date Value Ref Range Status  09/16/2021 7.45 (H) 0.00 - 5.00 ng/mL Final    Comment:    (NOTE) This test was performed using Architect's Chemiluminescent Microparticle Immunoassay. Values obtained from different assay methods cannot be used interchangeably. Please note that 5-10% of patients who smoke may see CEA levels up to 6.9 ng/mL. Performed at [facility], [place] [address] [place], [place] [address]      No results found for: AFPTUMOR  No results found for: CHROMOGRNA   No results found for: HGBA, HGBA2QUANT, HGBFQUANT, HGBSQUAN (Hemoglobinopathy evaluation)   Lab Results  Component Value Date   LDH 193 12/10/2013    No results found for: IRON, TIBC, IRONPCTSAT (Iron and TIBC)  No results found for: FERRITIN  Urinalysis    Component Value  Date/Time   COLORURINE YELLOW 09/17/2014 [place] 09/17/2014 0824   LABSPEC 1.015 09/17/2014 0824   PHURINE 7.5 09/17/2014 0824   GLUCOSEU NEGATIVE 09/17/2014 0824   HGBUR NEGATIVE 09/17/2014 0824   BILIRUBINUR NEGATIVE 09/17/2014 0824   KETONESUR NEGATIVE 09/17/2014 0824   UROBILINOGEN 0.2 09/17/2014 0824   NITRITE NEGATIVE 09/17/2014 0824   LEUKOCYTESUR NEGATIVE 09/17/2014 0824    STUDIES: No results found.   ELIGIBLE FOR AVAILABLE RESEARCH PROTOCOL: no  ASSESSMENT:   66 y.o. [place] woman status post left lumpectomy 1996 for a T1 N1, anatomic stage II invasive lobular breast cancer, estrogen receptor positive  (a) s/p adjuvant chemotherapy with doxorubicin and cyclophosphamide x4  (b) status post adjuvant radiation  (c) status post tamoxifen for 5+ years  (1) benign leukopenia: noted 2011, felt secondary to  prior chemotherapy  (2) genetics testing 06/23/2018 through [facility] MultiCancer panel found no deleterious mutations in the 84 genes tested including BRCA 1-2  (a) 2 variants of uncertain significance were fund in [place] [c.1037C>T (p.Ser346Phe) and c.503C>G (p.ALA168Gly)  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, 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 (3) staging studies:  (a) breast MRI 10/30/2019 shows sternal mottling  (b) bone scan 11/19/2019 shows multiple areas of uptake suspicious for metastatic disease  (c) CT of the chest abdomen and pelvis 11/28/2019 shows no visceral disease  (d) tumor markers 11/23/2019 shows a CEA of 18.11, CA 27-29 of 61.2, CA 125 of 21.1  (e) F-18-estradiol PET (cerianna) 01/11/2020 shows multiple sclerotic bone lesions which do not accumulate the estrogen receptor specific radiotracer  (f) bone marrow biopsy 01/25/2020 confirms metastatic breast cancer, gross cystic disease fluid protein positive, but estrogen and progesterone receptor negative.  HER-2/neu was 2+ on immunohistochemistry, negative by FISH (1.35/1.70).  (g) CARIS confirms triple negative disease, finds a positive androgen receptor at 3+, 70%; and the PIK3CA was indeterminate.  PTEN was positive by IHC at 90%.  PD-L1 was negative.  Genomic LOH was low and MSI was stable with proficient mismatch mismatch repair.  BRCA 1 and 2 were negative.  (4) denosumab/Xgeva started 12/13/2019, repeated monthly  (5) capecitabine started 02/25/2020, with significant plantar dysesthesia developing after 1 week, drug held 03/12/2020  (a) capecitabine restarted 03/25/2020 at 1000 mg in the morning only.  (b) bone scan 07/23/2020 shows no evidence of disease progression   (c) MRI of the total spine and pelvis 11/01/2020 shows multiple bone lesions but no epidural encroachment, no compression fracture and no evidence for lymphangitic spread of disease; there is significant degenerative disease  (d) repeat bone scan 01/13/2021 difficult  to interpret, no definitive progression  (e) capecitabine was held as of 01/30/2021 with significant palmar plantar erythrodysesthesia  (f) capecitabine resumed 03/10/2021 at a lower dose (1 g every other day).  (g) dose increased to 1 g daily Monday through Friday, off on weekends as of 04/28/2021 (h) capecitabine held after 09/16/2021 to allow time for PPED to recover (i) other options include CMF, sacituzumab govitecan, bicalutamide   PLAN: Brittany [name] is coming up on 2 years from her diagnosis of metastatic breast cancer.  Her disease is generally well controlled.  The apparent "increase in bone lesions" on her recent CT scan is due to her having received denosumab in the interval and the MRI which is more accurate shows no evidence of disease progression.  The problem is that while her cancer does respond very nicely to capecitabine even at very low doses she also develops palmar plantar erythrodysesthesia and this is a limiting  factor.  She is back on Xeloda after 2-week break for holidays while she was taking care of her granddaughters in [place].  She continues to tolerate her current dose of 1000 mg once a day well especially with the help of the sildenafil cream.  There is no concern for disease progression at this time.  Physical examination is unremarkable.  I have discussed with her to continue current treatment plan, return to clinic in 2 months unless she has any new questions or concerns.    Total encounter time 30 minutes.*   *Total Encounter Time as defined by the Centers for Medicare and Medicaid Services includes, in addition to the face-to-face time of a patient visit (documented in the note above) non-face-to-face time: obtaining and reviewing outside history, ordering and reviewing medications, tests or procedures, care coordination (communications with other health care professionals or caregivers) and documentation in the medical record.

## 2021-10-28 ENCOUNTER — Ambulatory Visit: Payer: Medicare Other

## 2021-10-28 ENCOUNTER — Inpatient Hospital Stay (HOSPITAL_BASED_OUTPATIENT_CLINIC_OR_DEPARTMENT_OTHER): Payer: Medicare Other | Admitting: Hematology and Oncology

## 2021-10-28 ENCOUNTER — Inpatient Hospital Stay: Payer: Medicare Other | Attending: Oncology

## 2021-10-28 ENCOUNTER — Inpatient Hospital Stay: Payer: Medicare Other

## 2021-10-28 ENCOUNTER — Other Ambulatory Visit: Payer: Self-pay

## 2021-10-28 ENCOUNTER — Other Ambulatory Visit: Payer: Medicare Other

## 2021-10-28 VITALS — BP 118/76 | HR 80 | Temp 97.7°F | Resp 16 | Ht 67.0 in | Wt 139.5 lb

## 2021-10-28 DIAGNOSIS — C7951 Secondary malignant neoplasm of bone: Secondary | ICD-10-CM | POA: Diagnosis present

## 2021-10-28 DIAGNOSIS — Z96643 Presence of artificial hip joint, bilateral: Secondary | ICD-10-CM

## 2021-10-28 DIAGNOSIS — Z170 Estrogen receptor positive status [ER+]: Secondary | ICD-10-CM

## 2021-10-28 DIAGNOSIS — Z79899 Other long term (current) drug therapy: Secondary | ICD-10-CM | POA: Diagnosis not present

## 2021-10-28 DIAGNOSIS — R208 Other disturbances of skin sensation: Secondary | ICD-10-CM | POA: Insufficient documentation

## 2021-10-28 DIAGNOSIS — C50112 Malignant neoplasm of central portion of left female breast: Secondary | ICD-10-CM | POA: Diagnosis present

## 2021-10-28 DIAGNOSIS — M160 Bilateral primary osteoarthritis of hip: Secondary | ICD-10-CM

## 2021-10-28 DIAGNOSIS — H40039 Anatomical narrow angle, unspecified eye: Secondary | ICD-10-CM

## 2021-10-28 DIAGNOSIS — E039 Hypothyroidism, unspecified: Secondary | ICD-10-CM | POA: Diagnosis not present

## 2021-10-28 DIAGNOSIS — Q6589 Other specified congenital deformities of hip: Secondary | ICD-10-CM

## 2021-10-28 DIAGNOSIS — Z87891 Personal history of nicotine dependence: Secondary | ICD-10-CM | POA: Diagnosis not present

## 2021-10-28 DIAGNOSIS — I7300 Raynaud's syndrome without gangrene: Secondary | ICD-10-CM

## 2021-10-28 LAB — COMPREHENSIVE METABOLIC PANEL
ALT: 21 U/L (ref 0–44)
AST: 31 U/L (ref 15–41)
Albumin: 4 g/dL (ref 3.5–5.0)
Alkaline Phosphatase: 71 U/L (ref 38–126)
Anion gap: 6 (ref 5–15)
BUN: 14 mg/dL (ref 8–23)
CO2: 28 mmol/L (ref 22–32)
Calcium: 9.3 mg/dL (ref 8.9–10.3)
Chloride: 98 mmol/L (ref 98–111)
Creatinine, Ser: 0.78 mg/dL (ref 0.44–1.00)
GFR, Estimated: >60 mL/min (ref >60)
Glucose, Bld: 97 mg/dL (ref 70–99)
Potassium: 4.2 mmol/L (ref 3.5–5.1)
Sodium: 132 mmol/L — ABNORMAL LOW (ref 135–145)
Total Bilirubin: 0.4 mg/dL (ref 0.3–1.2)
Total Protein: 7.1 g/dL (ref 6.5–8.1)

## 2021-10-28 LAB — CBC WITH DIFFERENTIAL/PLATELET
Abs Immature Granulocytes: 0.03 10*3/uL (ref 0.00–0.07)
Basophils Absolute: 0 10*3/uL (ref 0.0–0.1)
Basophils Relative: 1 %
Eosinophils Absolute: 0.1 10*3/uL (ref 0.0–0.5)
Eosinophils Relative: 2 %
HCT: 27.3 % — ABNORMAL LOW (ref 36.0–46.0)
Hemoglobin: 9.2 g/dL — ABNORMAL LOW (ref 12.0–15.0)
Immature Granulocytes: 1 %
Lymphocytes Relative: 21 %
Lymphs Abs: 0.8 10*3/uL (ref 0.7–4.0)
MCH: 35.9 pg — ABNORMAL HIGH (ref 26.0–34.0)
MCHC: 33.7 g/dL (ref 30.0–36.0)
MCV: 106.6 fL — ABNORMAL HIGH (ref 80.0–100.0)
Monocytes Absolute: 0.3 10*3/uL (ref 0.1–1.0)
Monocytes Relative: 7 %
Neutro Abs: 2.7 10*3/uL (ref 1.7–7.7)
Neutrophils Relative %: 68 %
Platelets: 176 10*3/uL (ref 150–400)
RBC: 2.56 MIL/uL — ABNORMAL LOW (ref 3.87–5.11)
RDW: 15.1 % (ref 11.5–15.5)
WBC: 3.8 10*3/uL — ABNORMAL LOW (ref 4.0–10.5)
nRBC: 1 % — ABNORMAL HIGH (ref 0.0–0.2)

## 2021-10-28 LAB — CEA (IN HOUSE-CHCC): CEA (CHCC-In House): 8.05 ng/mL — ABNORMAL HIGH (ref 0.00–5.00)

## 2021-10-28 LAB — TSH: TSH: 1.754 u[IU]/mL (ref 0.308–3.960)

## 2021-10-28 MED ORDER — DENOSUMAB 120 MG/1.7ML ~~LOC~~ SOLN
120.0000 mg | Freq: Once | SUBCUTANEOUS | Status: AC
  Administered 2021-10-28: 120 mg via SUBCUTANEOUS
  Filled 2021-10-28: qty 1.7

## 2021-10-29 LAB — CANCER ANTIGEN 27.29: CA 27.29: 36.3 U/mL (ref 0.0–38.6)

## 2021-11-02 ENCOUNTER — Encounter: Payer: Self-pay | Admitting: Physical Therapy

## 2021-11-02 ENCOUNTER — Other Ambulatory Visit: Payer: Self-pay

## 2021-11-02 ENCOUNTER — Ambulatory Visit: Payer: Medicare Other | Attending: Adult Health | Admitting: Physical Therapy

## 2021-11-02 DIAGNOSIS — I890 Lymphedema, not elsewhere classified: Secondary | ICD-10-CM | POA: Insufficient documentation

## 2021-11-02 DIAGNOSIS — R293 Abnormal posture: Secondary | ICD-10-CM | POA: Diagnosis present
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
## 2021-11-04 ENCOUNTER — Ambulatory Visit: Payer: Medicare Other | Admitting: Sports Medicine

## 2021-11-11 ENCOUNTER — Ambulatory Visit: Payer: Medicare Other

## 2021-11-11 ENCOUNTER — Ambulatory Visit (INDEPENDENT_AMBULATORY_CARE_PROVIDER_SITE_OTHER): Payer: Medicare Other | Admitting: Sports Medicine

## 2021-11-11 ENCOUNTER — Other Ambulatory Visit: Payer: Medicare Other

## 2021-11-11 VITALS — BP 112/82 | Ht 67.0 in | Wt 140.0 lb

## 2021-11-11 DIAGNOSIS — M7022 Olecranon bursitis, left elbow: Secondary | ICD-10-CM
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
## 2021-11-11 NOTE — Patient Instructions (Signed)
Aseptic (meaning, non-infectious) olecranon bursitis    Great to meet you today, [name].  -Add some compression over this elbow, you may ice to see if this helps reduce the swelling -We will continue to watch this for the next 4-8 weeks, if the swelling still persist and you would like to strain, contact me and we will be happy to see you for an appointment.   - Dr. Rolena Infante

## 2021-11-18 ENCOUNTER — Other Ambulatory Visit: Payer: Self-pay | Admitting: *Deleted

## 2021-11-18 DIAGNOSIS — C50112 Malignant neoplasm of central portion of left female breast: Secondary | ICD-10-CM

## 2021-11-18 MED ORDER — CAPECITABINE 500 MG PO TABS: ORAL_TABLET | ORAL | 0 refills | Status: DC

## 2021-11-24 ENCOUNTER — Encounter: Payer: Self-pay | Admitting: Hematology and Oncology

## 2021-11-26 ENCOUNTER — Telehealth: Payer: Self-pay | Admitting: *Deleted

## 2021-11-26 ENCOUNTER — Other Ambulatory Visit: Payer: Self-pay | Admitting: *Deleted

## 2021-11-26 ENCOUNTER — Encounter: Payer: Self-pay | Admitting: Oncology

## 2021-11-26 ENCOUNTER — Inpatient Hospital Stay: Payer: Medicare Other

## 2021-11-26 ENCOUNTER — Telehealth: Payer: Self-pay

## 2021-11-26 ENCOUNTER — Other Ambulatory Visit (HOSPITAL_COMMUNITY): Payer: Self-pay

## 2021-11-26 ENCOUNTER — Other Ambulatory Visit: Payer: Self-pay

## 2021-11-26 ENCOUNTER — Inpatient Hospital Stay: Payer: Medicare Other | Attending: Oncology

## 2021-11-26 VITALS — BP 121/84 | HR 82 | Temp 98.0°F | Resp 16

## 2021-11-26 DIAGNOSIS — C50112 Malignant neoplasm of central portion of left female breast: Secondary | ICD-10-CM

## 2021-11-26 DIAGNOSIS — Z171 Estrogen receptor negative status [ER-]: Secondary | ICD-10-CM | POA: Insufficient documentation

## 2021-11-26 DIAGNOSIS — C7951 Secondary malignant neoplasm of bone: Secondary | ICD-10-CM | POA: Diagnosis not present

## 2021-11-26 DIAGNOSIS — H40039 Anatomical narrow angle, unspecified eye: Secondary | ICD-10-CM

## 2021-11-26 DIAGNOSIS — Z170 Estrogen receptor positive status [ER+]: Secondary | ICD-10-CM

## 2021-11-26 DIAGNOSIS — I7300 Raynaud's syndrome without gangrene: Secondary | ICD-10-CM

## 2021-11-26 DIAGNOSIS — M160 Bilateral primary osteoarthritis of hip: Secondary | ICD-10-CM

## 2021-11-26 DIAGNOSIS — Z96643 Presence of artificial hip joint, bilateral: Secondary | ICD-10-CM

## 2021-11-26 DIAGNOSIS — Q6589 Other specified congenital deformities of hip: Secondary | ICD-10-CM

## 2021-11-26 LAB — COMPREHENSIVE METABOLIC PANEL
ALT: 33 U/L (ref 0–44)
AST: 41 U/L (ref 15–41)
Albumin: 4.1 g/dL (ref 3.5–5.0)
Alkaline Phosphatase: 65 U/L (ref 38–126)
Anion gap: 5 (ref 5–15)
BUN: 16 mg/dL (ref 8–23)
CO2: 28 mmol/L (ref 22–32)
Calcium: 9 mg/dL (ref 8.9–10.3)
Chloride: 98 mmol/L (ref 98–111)
Creatinine, Ser: 0.85 mg/dL (ref 0.44–1.00)
GFR, Estimated: >60 mL/min (ref >60)
Glucose, Bld: 88 mg/dL (ref 70–99)
Potassium: 4.4 mmol/L (ref 3.5–5.1)
Sodium: 131 mmol/L — ABNORMAL LOW (ref 135–145)
Total Bilirubin: 0.4 mg/dL (ref 0.3–1.2)
Total Protein: 7.2 g/dL (ref 6.5–8.1)

## 2021-11-26 LAB — CBC WITH DIFFERENTIAL/PLATELET
Abs Immature Granulocytes: 0.07 10*3/uL (ref 0.00–0.07)
Basophils Absolute: 0 10*3/uL (ref 0.0–0.1)
Basophils Relative: 1 %
Eosinophils Absolute: 0.1 10*3/uL (ref 0.0–0.5)
Eosinophils Relative: 3 %
HCT: 25.2 % — ABNORMAL LOW (ref 36.0–46.0)
Hemoglobin: 8.6 g/dL — ABNORMAL LOW (ref 12.0–15.0)
Immature Granulocytes: 2 %
Lymphocytes Relative: 43 %
Lymphs Abs: 1.7 10*3/uL (ref 0.7–4.0)
MCH: 36 pg — ABNORMAL HIGH (ref 26.0–34.0)
MCHC: 34.1 g/dL (ref 30.0–36.0)
MCV: 105.4 fL — ABNORMAL HIGH (ref 80.0–100.0)
Monocytes Absolute: 0.3 10*3/uL (ref 0.1–1.0)
Monocytes Relative: 8 %
Neutro Abs: 1.7 10*3/uL (ref 1.7–7.7)
Neutrophils Relative %: 43 %
Platelets: 152 10*3/uL (ref 150–400)
RBC: 2.39 MIL/uL — ABNORMAL LOW (ref 3.87–5.11)
RDW: 17.2 % — ABNORMAL HIGH (ref 11.5–15.5)
WBC: 3.9 10*3/uL — ABNORMAL LOW (ref 4.0–10.5)
nRBC: 1.8 % — ABNORMAL HIGH (ref 0.0–0.2)

## 2021-11-26 LAB — CEA (IN HOUSE-CHCC): CEA (CHCC-In House): 10.55 ng/mL — ABNORMAL HIGH (ref 0.00–5.00)

## 2021-11-26 MED ORDER — DENOSUMAB 120 MG/1.7ML ~~LOC~~ SOLN
120.0000 mg | Freq: Once | SUBCUTANEOUS | Status: AC
  Administered 2021-11-26: 120 mg via SUBCUTANEOUS
  Filled 2021-11-26: qty 1.7

## 2021-11-26 MED ORDER — CAPECITABINE 500 MG PO TABS: ORAL_TABLET | ORAL | 0 refills | Status: DC

## 2021-11-26 MED ORDER — CAPECITABINE 500 MG PO TABS
ORAL_TABLET | ORAL | 0 refills | Status: DC
  Filled 2021-11-26: qty 120, fill #0
  Filled 2021-12-25: qty 40, 28d supply, fill #0
  Filled 2022-01-19 (×2): qty 40, 28d supply, fill #1

## 2021-11-26 NOTE — Telephone Encounter (Signed)
Oral Oncology Patient Advocate Encounter  Was successful in securing patient a $15000 grant from [facility] to provide copayment coverage for Xeloda.  This will keep the out of pocket expense at $0.     [employer] ID: [id number]  I have spoken with the patient.   The billing information is as follows and has been shared with [place]: [id number] PCN: [id number] Member ID: [id number] Group ID: [id number]  Dates of Eligibility: 10/27/21 through 10/26/22  Fund:  [place] [facility] [place] Phone [phone] Fax [phone] 11/26/2021 11:41 AM

## 2021-11-26 NOTE — Patient Instructions (Signed)
Denosumab injection What is this medication? DENOSUMAB (den oh sue mab) slows bone breakdown. Prolia is used to treat osteoporosis in women after menopause and in men, and in people who are taking corticosteroids for 6 months or more. Delton See is used to treat a high calcium level due to cancer and to prevent bone fractures and other bone problems caused by multiple myeloma or cancer bone metastases. Delton See is also used to treat giant cell tumor of the bone. This medicine may be used for other purposes; ask your health care provider or pharmacist if you have questions. COMMON BRAND NAME(S): Prolia, XGEVA What should I tell my care team before I take this medication? They need to know if you have any of these conditions: dental disease having surgery or tooth extraction infection kidney disease low levels of calcium or Vitamin D in the blood malnutrition on hemodialysis skin conditions or sensitivity thyroid or parathyroid disease an unusual reaction to denosumab, other medicines, foods, dyes, or preservatives pregnant or trying to get pregnant breast-feeding How should I use this medication? This medicine is for injection under the skin. It is given by a health care professional in a hospital or clinic setting. A special MedGuide will be given to you before each treatment. Be sure to read this information carefully each time. For Prolia, talk to your pediatrician regarding the use of this medicine in children. Special care may be needed. For Delton See, talk to your pediatrician regarding the use of this medicine in children. While this drug may be prescribed for children as young as 13 years for selected conditions, precautions do apply. Overdosage: If you think you have taken too much of this medicine contact a poison control center or emergency room at once. NOTE: This medicine is only for you. Do not share this medicine with others. What if I miss a dose? It is important not to miss your dose.  Call your doctor or health care professional if you are unable to keep an appointment. What may interact with this medication? Do not take this medicine with any of the following medications: other medicines containing denosumab This medicine may also interact with the following medications: medicines that lower your chance of fighting infection steroid medicines like prednisone or cortisone This list may not describe all possible interactions. Give your health care provider a list of all the medicines, herbs, non-prescription drugs, or dietary supplements you use. Also tell them if you smoke, drink alcohol, or use illegal drugs. Some items may interact with your medicine. What should I watch for while using this medication? Visit your doctor or health care professional for regular checks on your progress. Your doctor or health care professional may order blood tests and other tests to see how you are doing. Call your doctor or health care professional for advice if you get a fever, chills or sore throat, or other symptoms of a cold or flu. Do not treat yourself. This drug may decrease your body's ability to fight infection. Try to avoid being around people who are sick. You should make sure you get enough calcium and vitamin D while you are taking this medicine, unless your doctor tells you not to. Discuss the foods you eat and the vitamins you take with your health care professional. See your dentist regularly. Brush and floss your teeth as directed. Before you have any dental work done, tell your dentist you are receiving this medicine. Do not become pregnant while taking this medicine or for 5 months after  stopping it. Talk with your doctor or health care professional about your birth control options while taking this medicine. Women should inform their doctor if they wish to become pregnant or think they might be pregnant. There is a potential for serious side effects to an unborn child. Talk to  your health care professional or pharmacist for more information. What side effects may I notice from receiving this medication? Side effects that you should report to your doctor or health care professional as soon as possible: allergic reactions like skin rash, itching or hives, swelling of the face, lips, or tongue bone pain breathing problems dizziness jaw pain, especially after dental work redness, blistering, peeling of the skin signs and symptoms of infection like fever or chills; cough; sore throat; pain or trouble passing urine signs of low calcium like fast heartbeat, muscle cramps or muscle pain; pain, tingling, numbness in the hands or feet; seizures unusual bleeding or bruising unusually weak or tired Side effects that usually do not require medical attention (report to your doctor or health care professional if they continue or are bothersome): constipation diarrhea headache joint pain loss of appetite muscle pain runny nose tiredness upset stomach This list may not describe all possible side effects. Call your doctor for medical advice about side effects. You may report side effects to FDA at 1-800-FDA-1088. Where should I keep my medication? This medicine is only given in a clinic, doctor's office, or other health care setting and will not be stored at home. NOTE: This sheet is a summary. It may not cover all possible information. If you have questions about this medicine, talk to your doctor, pharmacist, or health care provider.  2022 Elsevier/Gold Standard (2018-02-17 00:00:00)

## 2021-11-26 NOTE — Telephone Encounter (Signed)
Pt states she is unable to get her capecitibine filled at [facility] due to on back order- prescription was then sent to [facility] but medication is out of stock.  Pt states she uses " coupons " due to cost " because it's not covered under my insurance "  She asked if the medication could go to our out pt speciality pharmacy.  This RN sent prescription to the [facility] out pt pharmacy.  This note will also be sent to our oral chemo pharm dept for review for any additional resources or submission to her insurance.

## 2021-11-27 ENCOUNTER — Ambulatory Visit (INDEPENDENT_AMBULATORY_CARE_PROVIDER_SITE_OTHER): Payer: Medicare Other | Admitting: Sports Medicine

## 2021-11-27 DIAGNOSIS — M7022 Olecranon bursitis, left elbow: Secondary | ICD-10-CM | POA: Diagnosis present

## 2021-11-27 LAB — CANCER ANTIGEN 27.29: CA 27.29: 58.2 U/mL — ABNORMAL HIGH (ref 0.0–38.6)

## 2021-11-27 MED ORDER — METHYLPREDNISOLONE ACETATE 40 MG/ML IJ SUSP
40.0000 mg | Freq: Once | INTRAMUSCULAR | Status: AC
  Administered 2021-11-27: 40 mg via INTRA_ARTICULAR
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
## 2021-11-30 ENCOUNTER — Other Ambulatory Visit (HOSPITAL_COMMUNITY): Payer: Self-pay

## 2021-12-07 ENCOUNTER — Other Ambulatory Visit: Payer: Self-pay | Admitting: Internal Medicine

## 2021-12-07 ENCOUNTER — Encounter: Payer: Self-pay | Admitting: Sports Medicine

## 2021-12-22 ENCOUNTER — Other Ambulatory Visit (HOSPITAL_COMMUNITY): Payer: Self-pay

## 2021-12-24 ENCOUNTER — Inpatient Hospital Stay (HOSPITAL_BASED_OUTPATIENT_CLINIC_OR_DEPARTMENT_OTHER): Payer: Medicare Other | Admitting: Hematology and Oncology

## 2021-12-24 ENCOUNTER — Inpatient Hospital Stay: Payer: Medicare Other | Attending: Oncology

## 2021-12-24 ENCOUNTER — Encounter: Payer: Self-pay | Admitting: Hematology and Oncology

## 2021-12-24 ENCOUNTER — Inpatient Hospital Stay: Payer: Medicare Other

## 2021-12-24 ENCOUNTER — Other Ambulatory Visit: Payer: Self-pay

## 2021-12-24 VITALS — BP 134/84 | HR 86 | Temp 97.7°F | Resp 16 | Ht 67.0 in | Wt 143.1 lb

## 2021-12-24 DIAGNOSIS — M160 Bilateral primary osteoarthritis of hip: Secondary | ICD-10-CM

## 2021-12-24 DIAGNOSIS — Z96643 Presence of artificial hip joint, bilateral: Secondary | ICD-10-CM

## 2021-12-24 DIAGNOSIS — C50112 Malignant neoplasm of central portion of left female breast: Secondary | ICD-10-CM

## 2021-12-24 DIAGNOSIS — C7951 Secondary malignant neoplasm of bone: Secondary | ICD-10-CM | POA: Insufficient documentation

## 2021-12-24 DIAGNOSIS — Z170 Estrogen receptor positive status [ER+]: Secondary | ICD-10-CM

## 2021-12-24 DIAGNOSIS — I7300 Raynaud's syndrome without gangrene: Secondary | ICD-10-CM

## 2021-12-24 DIAGNOSIS — Z171 Estrogen receptor negative status [ER-]: Secondary | ICD-10-CM | POA: Insufficient documentation

## 2021-12-24 DIAGNOSIS — H40039 Anatomical narrow angle, unspecified eye: Secondary | ICD-10-CM

## 2021-12-24 DIAGNOSIS — Q6589 Other specified congenital deformities of hip: Secondary | ICD-10-CM

## 2021-12-24 LAB — CBC WITH DIFFERENTIAL/PLATELET
Abs Immature Granulocytes: 0.04 10*3/uL (ref 0.00–0.07)
Basophils Absolute: 0 10*3/uL (ref 0.0–0.1)
Basophils Relative: 1 %
Eosinophils Absolute: 0.1 10*3/uL (ref 0.0–0.5)
Eosinophils Relative: 3 %
HCT: 26.3 % — ABNORMAL LOW (ref 36.0–46.0)
Hemoglobin: 8.8 g/dL — ABNORMAL LOW (ref 12.0–15.0)
Immature Granulocytes: 1 %
Lymphocytes Relative: 42 %
Lymphs Abs: 1.6 10*3/uL (ref 0.7–4.0)
MCH: 36.1 pg — ABNORMAL HIGH (ref 26.0–34.0)
MCHC: 33.5 g/dL (ref 30.0–36.0)
MCV: 107.8 fL — ABNORMAL HIGH (ref 80.0–100.0)
Monocytes Absolute: 0.3 10*3/uL (ref 0.1–1.0)
Monocytes Relative: 7 %
Neutro Abs: 1.8 10*3/uL (ref 1.7–7.7)
Neutrophils Relative %: 46 %
Platelets: 135 10*3/uL — ABNORMAL LOW (ref 150–400)
RBC: 2.44 MIL/uL — ABNORMAL LOW (ref 3.87–5.11)
RDW: 18 % — ABNORMAL HIGH (ref 11.5–15.5)
WBC: 3.8 10*3/uL — ABNORMAL LOW (ref 4.0–10.5)
nRBC: 2.7 % — ABNORMAL HIGH (ref 0.0–0.2)

## 2021-12-24 LAB — COMPREHENSIVE METABOLIC PANEL
ALT: 91 U/L — ABNORMAL HIGH (ref 0–44)
AST: 94 U/L — ABNORMAL HIGH (ref 15–41)
Albumin: 4.1 g/dL (ref 3.5–5.0)
Alkaline Phosphatase: 85 U/L (ref 38–126)
Anion gap: 6 (ref 5–15)
BUN: 14 mg/dL (ref 8–23)
CO2: 27 mmol/L (ref 22–32)
Calcium: 9.7 mg/dL (ref 8.9–10.3)
Chloride: 98 mmol/L (ref 98–111)
Creatinine, Ser: 0.83 mg/dL (ref 0.44–1.00)
GFR, Estimated: >60 mL/min (ref >60)
Glucose, Bld: 70 mg/dL (ref 70–99)
Potassium: 4.8 mmol/L (ref 3.5–5.1)
Sodium: 131 mmol/L — ABNORMAL LOW (ref 135–145)
Total Bilirubin: 0.4 mg/dL (ref 0.3–1.2)
Total Protein: 7.2 g/dL (ref 6.5–8.1)

## 2021-12-24 LAB — CEA (IN HOUSE-CHCC): CEA (CHCC-In House): 30.37 ng/mL — ABNORMAL HIGH (ref 0.00–5.00)

## 2021-12-24 MED ORDER — DENOSUMAB 120 MG/1.7ML ~~LOC~~ SOLN
120.0000 mg | Freq: Once | SUBCUTANEOUS | Status: AC
  Administered 2021-12-24: 120 mg via SUBCUTANEOUS
  Filled 2021-12-24: qty 1.7

## 2021-12-24 NOTE — Progress Notes (Signed)
[place]  [phone] [phone]     ID: BRISEIDA GITTINGS DOB: 25-Feb-1956  MR#: [id number]  XHB#:[id number]  Patient Care Team: Binnie Rail, MD as PCP - General (Internal Medicine) Aloha Gell, MD as Consulting Physician (Obstetrics and Gynecology) Sydnee Levans, MD (Dermatology) Magrinat, Virgie Dad, MD (Inactive) as Consulting Physician (Oncology) Richmond Campbell, MD as Consulting Physician (Gastroenterology) Stefanie Libel, MD as Consulting Physician (Sports Medicine) Eunice Blase, MD (Family Medicine) Erline Levine, MD as Consulting Physician (Neurosurgery) Hetty Blend, Alfredo Bach, MD as Referring Physician (Orthopedic Surgery) Debbra Riding, MD as Consulting Physician (Ophthalmology) Raina Mina, RPH-CPP (Pharmacist) Benay Pike, MD OTHER MD:  CHIEF COMPLAINT: Stage IV lobular breast cancer  CURRENT TREATMENT: capecitabine at metronomic doses   INTERVAL HISTORY:  [name] returns today for follow up of her stage IV lobular breast cancer.   She has been on and off capecitabine since 02/25/2020 at variably low doses due to palmar plantar erythrodysesthesia.  Currently she takes 2 tablets daily. Since her last visit, HFS is similar, no worsening reported No nausea, vomiting or diarrhea, mucositis No other new bone pains, change in breathing, bowel habits or urinary habits.   No new bone pains. No new neurological complaints. Rest of the pertinent 10 point ROS reviewed and negative.   Lab Results  Component Value Date   CA2729 58.2 (H) 11/26/2021   CA2729 36.3 10/28/2021   CA2729 39.8 (H) 09/16/2021   CA2729 45.1 (H) 08/13/2021   CA2729 46.3 (H) 07/22/2021   Lab Results  Component Value Date   CEA1 10.55 (H) 11/26/2021   CEA1 8.05 (H) 10/28/2021   CEA1 7.45 (H) 09/16/2021   CEA1 6.76 (H) 08/13/2021   CEA1 6.26 (H) 07/22/2021    REVIEW OF SYSTEMS:    COVID 19 VACCINATION STATUS: Status post Pfizer x2  with booster August 2021   HISTORY OF CURRENT ILLNESS: From the original intake note:  "[name]" Yerby has a history of left breast cancer dating back to 66.  She was followed by Dr. Beryle Beams and underwent lumpectomy, adjuvant chemotherapy, radiation and "hormonal therapy".  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  She took tamoxifen for many years and then raloxifene.  Dr. Beryle Beams also evaluated her for leukopenia which was noted at first in 2011, worked up with a negative ANA and negative rheumatoid factors.  He felt it was a benign residual from her earlier chemotherapy.  She was released from follow-up in 2015.  She has continued on intensified screening because of her family history.  On November 23, 2017 she had bilateral breast MRIs which were unremarkable.  Mammography November 20, 2018 showed no findings suspicious of malignancy.  Breast MRI 10/30/2019 showed a new irregular 0.4 cm enhancing focus in the upper inner quadrant of the right breast.  The left breast continued to be unremarkable except for postoperative changes and there were no abnormal appearing lymph nodes.  However in that same MRI multiple areas of enhancement were noted in the sternum.  This had not been seen in the prior MRI.  Plan right MRI biopsy 11/16/2019 was canceled as the previously demonstrated 0.4 cm focus in the right breast showed only a tortuous vein at that location.  However bone scan 11/19/2019 obtained to follow-up on the sternal findings showed additional areas of concern at T12, T7-T11, L4 and ribs, as well as the  sternum.  The patient's subsequent history is as detailed below.   PAST MEDICAL HISTORY: Past Medical History:  Diagnosis Date   ANEMIA-NOS    Asymptomatic varicose veins    Bone metastases ([place]) 2021   Breast cancer ([place]) 1996   LEFT BREAST CA   BREAST CANCER, HX OF 01/1995 dx   s/p  Lumpectomy, XRT, chemo and 66yrarmidex   Gestational diabetes mellitus in childbirth, diet controlled 1985   Hypothyroid 09/28/2015   Dx 09/2015   Leukopenia    mild since chemo   OSTEOARTHRITIS    Personal history of chemotherapy    Personal history of radiation therapy    SCOLIOSIS, MILD     PAST SURGICAL HISTORY: Past Surgical History:  Procedure Laterality Date   BREAST BIOPSY     BREAST LUMPECTOMY Left    [place]   [place]    [place]  Problem Relation Age of Onset   Heart disease Father 68      AMI   Hyperlipidemia Father    Valvular heart disease Father 68  Parkinson's disease Father 68  Arthritis Mother    Breast cancer Mother 68  Hyperlipidemia Mother    Osteoporosis Mother    Pancreatic cancer Maternal Grandmother 68      presumed dx   Diabetes Paternal Grandfather        type 2   Breast cancer Maternal Aunt   The patient's father died at the age of 68from heart disease.  He had Lewy body dementia.  The patient's mother is 68years old as of January 2021.  She is in a memory unit.  She has a history of breast cancer diagnosed in her 68s(ductal carcinoma in situ).  The patient's mother sister had breast cancer diagnosed around age 68  The patient's mother's mother also had cancer, "internal" (possibly ovarian or gastrointestinal).  There is no cancer on the father's side of the family.   GYNECOLOGIC HISTORY:  No LMP recorded. Patient is postmenopausal. Menarche: 66years old Age at first live birth: 66years [place] 2 [place] with chemotherapy, in 1996 Contraceptive a little over a year, with no complications HRT no  Hysterectomy?  No Salpingo-oophorectomy?  No   SOCIAL HISTORY:  [place]uated from [place] a degree in psychology and IT.  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  They both finally retired 03/28/2020.  Their daughter JAnderson Maltais a hPharmacist, hospitalin [place]  Their son JJenny Reichmannworks for the same wRyland Groupthat the patient worked for.  Note that JJenny Reichmanndid the PUnisys [employer]  The patient has 1 grandchild ([place].  The patient attends [employer] [place] The patient's husband is her healthcare power of attorney   HEALTH MAINTENANCE: Social History   Tobacco Use   Smoking status: Former    Types: Cigarettes    Quit date: 06/25/1977    Years since quitting: 44.5   Smokeless tobacco: Never  Substance Use Topics  Alcohol use: Yes    Alcohol/week: 3.0 - 4.0 standard drinks    Types: 3 - 4 Glasses of wine per week   Drug use: No     Colonoscopy: 2015  PAP: Up-to-date  Bone density: 11/20/2018, normal   No Known Allergies  Current Outpatient Medications  Medication Sig Dispense Refill   acetaminophen (TYLENOL) 500 MG tablet Take 1 tablet (500 mg total) by mouth in the morning, at noon, and at bedtime. Take with aleve 220 mg 90 tablet 0   Calcium Citrate 250 MG TABS Take 4 tablets (1,000 mg total) by mouth daily.  0   capecitabine (XELODA) 500 MG tablet Take 2 tablets (1000 mg) daily Monday through Friday, skip Saturdays and Sundays. 120 tablet 0   Cholecalciferol (VITAMIN D3) 1000 UNITS CAPS Take by mouth daily.     Denosumab (XGEVA [place]) Inject into the skin.     diclofenac sodium (VOLTAREN) 1 % GEL Apply 2 g topically 3 (three) times daily as needed. 100 g 1   gabapentin (NEURONTIN) 300 MG capsule Take 1 capsule (300 mg total) by mouth 3 (three) times daily. 270 capsule 1   levothyroxine (SYNTHROID) 50 MCG tablet TAKE ONE TABLET BY MOUTH DAILY 90 tablet 3   Multiple Vitamin (MULTIVITAMIN)  tablet Take 1 tablet by mouth daily.     naproxen sodium (ALEVE) 220 MG tablet Take 1 tablet (220 mg total) by mouth in the morning, at noon, and at bedtime. Take with tylenol 500 mg 90 tablet 4   Omega-3 Fatty Acids (FISH OIL) 1000 MG CPDR Take by mouth daily.     UNABLE TO FIND Med Name : sildenafil 2% cream apply to hands and feet bid prn for CIPN 30 g 3   UNABLE TO FIND Med Name : Sildenafil 2% cream apply to hands and feet bid while taking capecitibine 30 g 3   No current facility-administered medications for this visit.    OBJECTIVE: White woman in no acute distress Vitals:   12/24/21 0854  BP: 134/84  Pulse: 86  Resp: 16  Temp: 97.7 F (36.5 C)  SpO2: 100%       Body mass index is 22.41 kg/m.   Wt Readings from Last 3 Encounters:  12/24/21 143 lb 1.6 oz (64.9 kg)  11/27/21 140 lb (63.5 kg)  11/11/21 140 lb (63.5 kg)     ECOG FS:1 - Symptomatic but completely ambulatory Physical Exam Constitutional:      Appearance: Normal appearance.  Pulmonary:     Effort: Pulmonary effort is normal.     Breath sounds: Normal breath sounds.  Chest:     Comments: No axillary adenopathy Abdominal:     General: Abdomen is flat. Bowel sounds are normal.     Palpations: Abdomen is soft. There is no mass.  Musculoskeletal:        General: No swelling or tenderness. Normal range of motion.     Cervical back: Normal range of motion and neck supple. No rigidity.  Lymphadenopathy:     Cervical: No cervical adenopathy.  Skin:    Findings: Rash (HFS grade 1 both hands and feet) present.  Neurological:     General: No focal deficit present.     Mental Status: She is alert.  Psychiatric:        Mood and Affect: Mood normal.      LAB RESULTS:  CMP     Component Value Date/Time   NA 131 (L) 11/26/2021 0908   NA 139  12/10/2013 0841   K 4.4 11/26/2021 0908   K 4.5 12/10/2013 0841   CL 98 11/26/2021 0908   CL 102 12/01/2012 0955   CO2 28 11/26/2021 0908   CO2 28 12/10/2013 0841    GLUCOSE 88 11/26/2021 0908   GLUCOSE 81 12/10/2013 0841   GLUCOSE 88 12/01/2012 0955   BUN 16 11/26/2021 0908   BUN 14 08/04/2015 0000   BUN 17.5 12/10/2013 0841   CREATININE 0.85 11/26/2021 0908   CREATININE 0.9 12/10/2013 0841   CALCIUM 9.0 11/26/2021 0908   CALCIUM 9.6 12/10/2013 0841   PROT 7.2 11/26/2021 0908   PROT 6.7 12/10/2013 0841   ALBUMIN 4.1 11/26/2021 0908   ALBUMIN 3.9 12/10/2013 0841   AST 41 11/26/2021 0908   AST 22 12/10/2013 0841   ALT 33 11/26/2021 0908   ALT 27 12/10/2013 0841   ALKPHOS 65 11/26/2021 0908   ALKPHOS 54 12/10/2013 0841   BILITOT 0.4 11/26/2021 0908   BILITOT 0.41 12/10/2013 0841   GFRNONAA >60 11/26/2021 0908   GFRAA >60 07/02/2020 1324    No results found for: Ronnald Ramp, A1GS, A2GS, BETS, BETA2SER, GAMS, MSPIKE, SPEI  No results found for: KPAFRELGTCHN, LAMBDASER, [facility]  Lab Results  Component Value Date   WBC 3.8 (L) 12/24/2021   NEUTROABS 1.8 12/24/2021   HGB 8.8 (L) 12/24/2021   HCT 26.3 (L) 12/24/2021   MCV 107.8 (H) 12/24/2021   PLT 135 (L) 12/24/2021      Chemistry      Component Value Date/Time   NA 131 (L) 11/26/2021 0908   NA 139 12/10/2013 0841   K 4.4 11/26/2021 0908   K 4.5 12/10/2013 0841   CL 98 11/26/2021 0908   CL 102 12/01/2012 0955   CO2 28 11/26/2021 0908   CO2 28 12/10/2013 0841   BUN 16 11/26/2021 0908   BUN 14 08/04/2015 0000   BUN 17.5 12/10/2013 0841   CREATININE 0.85 11/26/2021 0908   CREATININE 0.9 12/10/2013 0841   GLU 102 08/04/2015 0000      Component Value Date/Time   CALCIUM 9.0 11/26/2021 0908   CALCIUM 9.6 12/10/2013 0841   ALKPHOS 65 11/26/2021 0908   ALKPHOS 54 12/10/2013 0841   AST 41 11/26/2021 0908   AST 22 12/10/2013 0841   ALT 33 11/26/2021 0908   ALT 27 12/10/2013 0841   BILITOT 0.4 11/26/2021 0908   BILITOT 0.41 12/10/2013 0841      No results found for: LABCA2  No components found for: [id number]  No results for input(s): INR in the last  168 hours.  No results found for: LABCA2  No results found for: CAN199  Lab Results  Component Value Date   SVX793 21.1 11/23/2019    No results found for: JQZ009  Lab Results  Component Value Date   CA2729 58.2 (H) 11/26/2021    No components found for: HGQUANT  Lab Results  Component Value Date   CEA1 10.55 (H) 11/26/2021   /  CEA ([facility])  Date Value Ref Range Status  11/26/2021 10.55 (H) 0.00 - 5.00 ng/mL Final    Comment:    (NOTE) This test was performed using [employer]'s Chemiluminescent Microparticle Immunoassay. Values obtained from different assay methods cannot be used interchangeably. Please note that 5-10% of patients who smoke may see CEA levels up to 6.9 ng/mL. Performed at [facility], [place] [address] [place], [place] [address]      No results found for: [facility]  No results found for: CHROMOGRNA   No results found for: HGBA, HGBA2QUANT, HGBFQUANT, HGBSQUAN (Hemoglobinopathy evaluation)   Lab Results  Component Value Date   LDH 193 12/10/2013    No results found for: IRON, TIBC, IRONPCTSAT (Iron and TIBC)  No results found for: FERRITIN  Urinalysis    Component Value Date/Time   COLORURINE YELLOW 09/17/2014 [place] 09/17/2014 0824   LABSPEC 1.015 09/17/2014 0824   PHURINE 7.5 09/17/2014 [place] 09/17/2014 [place] 09/17/2014 [place] 09/17/2014 0824   KETONESUR NEGATIVE 09/17/2014 0824   UROBILINOGEN 0.2 09/17/2014 0824   NITRITE NEGATIVE 09/17/2014 0824   LEUKOCYTESUR NEGATIVE 09/17/2014 0824    STUDIES: No results found.   ELIGIBLE FOR AVAILABLE RESEARCH PROTOCOL: no  ASSESSMENT:   66 y.o. [place] woman status post left lumpectomy 1996 for a T1 N1, anatomic stage II invasive lobular breast cancer, estrogen receptor positive  (a) s/p adjuvant chemotherapy with doxorubicin and cyclophosphamide x4  (b) status post  adjuvant radiation  (c) status post tamoxifen for 5+ years  (1) benign leukopenia: noted 2011, felt secondary to prior chemotherapy  (2) genetics testing 06/23/2018 through [facility] MultiCancer panel found no deleterious mutations in the 84 genes tested including BRCA 1-2  (a) 2 variants of uncertain significance were fund in [place] [c.1037C>T (p.Ser346Phe) and c.503C>G (p.ALA168Gly)  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, 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 (3) staging studies:  (a) breast MRI 10/30/2019 shows sternal mottling  (b) bone scan 11/19/2019 shows multiple areas of uptake suspicious for metastatic disease  (c) CT of the chest abdomen and pelvis 11/28/2019 shows no visceral disease  (d) tumor markers 11/23/2019 shows a CEA of 18.11, CA 27-29 of 61.2, CA 125 of 21.1  (e) F-18-estradiol PET (cerianna) 01/11/2020 shows multiple sclerotic bone lesions which do not accumulate the estrogen receptor specific radiotracer  (f) bone marrow biopsy 01/25/2020 confirms metastatic breast cancer, gross cystic disease fluid protein positive, but estrogen and progesterone receptor negative.  HER-2/neu was 2+ on immunohistochemistry, negative by FISH (1.35/1.70).  (g) CARIS confirms triple negative disease, finds a positive androgen receptor at 3+, 70%; and the PIK3CA was indeterminate.  PTEN was positive by IHC at 90%.  PD-L1 was negative.  Genomic LOH was low and MSI was stable with proficient mismatch mismatch repair.  BRCA 1 and 2 were negative.  (4) denosumab/Xgeva started 12/13/2019, repeated monthly  (5) capecitabine started 02/25/2020, with significant plantar dysesthesia developing after 1 week, drug held 03/12/2020  (a) capecitabine restarted 03/25/2020 at 1000 mg in the morning only.  (b) bone scan 07/23/2020 shows no evidence of disease progression   (c) MRI of the total spine and pelvis 11/01/2020 shows multiple bone lesions but no epidural encroachment, no compression fracture and no evidence for  lymphangitic spread of disease; there is significant degenerative disease  (d) repeat bone scan 01/13/2021 difficult to interpret, no definitive progression  (e) capecitabine was held as of 01/30/2021 with significant palmar plantar erythrodysesthesia  (f) capecitabine resumed 03/10/2021 at a lower dose (1 g every other day).  (g) dose increased to 1 g daily Monday through Friday, off on weekends as of 04/28/2021 (h) capecitabine held after 09/16/2021 to allow time for PPED to recover (i) other options include CMF, sacituzumab govitecan, bicalutamide   PLAN: Ms. Cogar is here for follow-up on Xeloda.  She is currently taking 2 tablets of Xeloda daily. She has been tolerating this dose well with grade 1 HFS, HFS  currently well controlled on sildenafil cream. Her last imaging was in November 2022. We plan to repeat CT chest as well as MR total spine for evaluation of disease response while on Xeloda. If she continues to have response to Xeloda, will continue at the current dose.  She also has a hard time obtaining Xeloda because of some drug shortages.  I engaged her oral pharmacist for further assistance. If she has any evidence of progression on Xeloda, we can also consider sacituzumab govitecan or chemotherapy for next line of treatment.  Clinically I do not see any evidence of progression.  No concerning review of system or physical examination findings. Total encounter time 30 minutes.*   *Total Encounter Time as defined by the Centers for Medicare and Medicaid Services includes, in addition to the face-to-face time of a patient visit (documented in the note above) non-face-to-face time: obtaining and reviewing outside history, ordering and reviewing medications, tests or procedures, care coordination (communications with other health care professionals or caregivers) and documentation in the medical record.

## 2021-12-25 ENCOUNTER — Other Ambulatory Visit (HOSPITAL_COMMUNITY): Payer: Self-pay

## 2021-12-25 LAB — CANCER ANTIGEN 27.29: CA 27.29: 81.3 U/mL — ABNORMAL HIGH (ref 0.0–38.6)

## 2022-01-03 ENCOUNTER — Ambulatory Visit (HOSPITAL_COMMUNITY)
Admission: RE | Admit: 2022-01-03 | Discharge: 2022-01-03 | Disposition: A | Discharge status: Home / Self Care | Payer: Medicare Other | Source: Ambulatory Visit | Attending: Hematology and Oncology | Admitting: Hematology and Oncology

## 2022-01-03 ENCOUNTER — Other Ambulatory Visit: Payer: Self-pay

## 2022-01-03 DIAGNOSIS — Z170 Estrogen receptor positive status [ER+]: Secondary | ICD-10-CM

## 2022-01-03 DIAGNOSIS — C50112 Malignant neoplasm of central portion of left female breast: Secondary | ICD-10-CM | POA: Insufficient documentation

## 2022-01-03 DIAGNOSIS — C7951 Secondary malignant neoplasm of bone: Secondary | ICD-10-CM

## 2022-01-03 MED ORDER — IOHEXOL 300 MG/ML  SOLN
75.0000 mL | Freq: Once | INTRAMUSCULAR | Status: AC | PRN
  Administered 2022-01-03: 75 mL via INTRAVENOUS

## 2022-01-03 MED ORDER — GADOBUTROL 1 MMOL/ML IV SOLN
6.0000 mL | Freq: Once | INTRAVENOUS | Status: AC | PRN
  Administered 2022-01-03: 6 mL via INTRAVENOUS

## 2022-01-06 ENCOUNTER — Encounter: Payer: Self-pay | Admitting: Hematology and Oncology

## 2022-01-19 ENCOUNTER — Other Ambulatory Visit (HOSPITAL_COMMUNITY): Payer: Self-pay

## 2022-01-21 ENCOUNTER — Other Ambulatory Visit: Payer: Self-pay

## 2022-01-21 ENCOUNTER — Inpatient Hospital Stay: Payer: Medicare Other

## 2022-01-21 VITALS — BP 122/82 | HR 80 | Temp 98.7°F | Resp 16

## 2022-01-21 DIAGNOSIS — M160 Bilateral primary osteoarthritis of hip: Secondary | ICD-10-CM

## 2022-01-21 DIAGNOSIS — Z96643 Presence of artificial hip joint, bilateral: Secondary | ICD-10-CM

## 2022-01-21 DIAGNOSIS — C7951 Secondary malignant neoplasm of bone: Secondary | ICD-10-CM | POA: Diagnosis not present

## 2022-01-21 DIAGNOSIS — H40039 Anatomical narrow angle, unspecified eye: Secondary | ICD-10-CM

## 2022-01-21 DIAGNOSIS — Q6589 Other specified congenital deformities of hip: Secondary | ICD-10-CM

## 2022-01-21 DIAGNOSIS — I7300 Raynaud's syndrome without gangrene: Secondary | ICD-10-CM

## 2022-01-21 DIAGNOSIS — C50112 Malignant neoplasm of central portion of left female breast: Secondary | ICD-10-CM

## 2022-01-21 LAB — CBC WITH DIFFERENTIAL/PLATELET
Abs Immature Granulocytes: 0.05 10*3/uL (ref 0.00–0.07)
Basophils Absolute: 0 10*3/uL (ref 0.0–0.1)
Basophils Relative: 1 %
Eosinophils Absolute: 0.1 10*3/uL (ref 0.0–0.5)
Eosinophils Relative: 2 %
HCT: 24.6 % — ABNORMAL LOW (ref 36.0–46.0)
Hemoglobin: 8.3 g/dL — ABNORMAL LOW (ref 12.0–15.0)
Immature Granulocytes: 1 %
Lymphocytes Relative: 49 %
Lymphs Abs: 2 10*3/uL (ref 0.7–4.0)
MCH: 37.2 pg — ABNORMAL HIGH (ref 26.0–34.0)
MCHC: 33.7 g/dL (ref 30.0–36.0)
MCV: 110.3 fL — ABNORMAL HIGH (ref 80.0–100.0)
Monocytes Absolute: 0.3 10*3/uL (ref 0.1–1.0)
Monocytes Relative: 7 %
Neutro Abs: 1.6 10*3/uL — ABNORMAL LOW (ref 1.7–7.7)
Neutrophils Relative %: 40 %
Platelets: 91 10*3/uL — ABNORMAL LOW (ref 150–400)
RBC: 2.23 MIL/uL — ABNORMAL LOW (ref 3.87–5.11)
RDW: 19.1 % — ABNORMAL HIGH (ref 11.5–15.5)
WBC: 4 10*3/uL (ref 4.0–10.5)
nRBC: 4.7 % — ABNORMAL HIGH (ref 0.0–0.2)

## 2022-01-21 LAB — COMPREHENSIVE METABOLIC PANEL
ALT: 169 U/L — ABNORMAL HIGH (ref 0–44)
AST: 182 U/L (ref 15–41)
Albumin: 3.8 g/dL (ref 3.5–5.0)
Alkaline Phosphatase: 115 U/L (ref 38–126)
Anion gap: 6 (ref 5–15)
BUN: 14 mg/dL (ref 8–23)
CO2: 26 mmol/L (ref 22–32)
Calcium: 8.8 mg/dL — ABNORMAL LOW (ref 8.9–10.3)
Chloride: 99 mmol/L (ref 98–111)
Creatinine, Ser: 0.73 mg/dL (ref 0.44–1.00)
GFR, Estimated: >60 mL/min (ref >60)
Glucose, Bld: 137 mg/dL — ABNORMAL HIGH (ref 70–99)
Potassium: 4.4 mmol/L (ref 3.5–5.1)
Sodium: 131 mmol/L — ABNORMAL LOW (ref 135–145)
Total Bilirubin: 0.7 mg/dL (ref 0.3–1.2)
Total Protein: 6.7 g/dL (ref 6.5–8.1)

## 2022-01-21 LAB — CEA (IN HOUSE-CHCC): CEA (CHCC-In House): 148.02 ng/mL — ABNORMAL HIGH (ref 0.00–5.00)

## 2022-01-21 MED ORDER — DENOSUMAB 120 MG/1.7ML ~~LOC~~ SOLN
120.0000 mg | Freq: Once | SUBCUTANEOUS | Status: AC
  Administered 2022-01-21: 120 mg via SUBCUTANEOUS
  Filled 2022-01-21: qty 1.7

## 2022-01-21 NOTE — Progress Notes (Signed)
Pt is here for Xgeva, calcium level is 8.8 today and liver enzymes are elevated. Dr. Chryl Heck and Hinda Lenis RN are aware. Per Hinda Lenis RN, okay to proceed with injection today and to let pt know to take extra calcium the next 4 days and to abstain from any wine until lab recheck next Monday. Pt verbalized understanding.  ?

## 2022-01-22 ENCOUNTER — Other Ambulatory Visit (HOSPITAL_COMMUNITY): Payer: Self-pay

## 2022-01-22 LAB — CANCER ANTIGEN 27.29: CA 27.29: 115.8 U/mL — ABNORMAL HIGH (ref 0.0–38.6)

## 2022-01-24 ENCOUNTER — Encounter: Payer: Self-pay | Admitting: Oncology

## 2022-01-25 ENCOUNTER — Other Ambulatory Visit (HOSPITAL_COMMUNITY): Payer: Self-pay

## 2022-01-25 ENCOUNTER — Encounter: Payer: Self-pay | Admitting: Oncology

## 2022-01-25 ENCOUNTER — Other Ambulatory Visit: Payer: Self-pay | Admitting: *Deleted

## 2022-01-25 ENCOUNTER — Other Ambulatory Visit: Payer: Self-pay

## 2022-01-25 ENCOUNTER — Encounter: Payer: Self-pay | Admitting: Hematology and Oncology

## 2022-01-25 ENCOUNTER — Inpatient Hospital Stay: Payer: Medicare Other | Attending: Oncology | Admitting: Hematology and Oncology

## 2022-01-25 VITALS — BP 133/74 | HR 83 | Temp 97.3°F | Resp 16 | Wt 146.9 lb

## 2022-01-25 DIAGNOSIS — R7401 Elevation of levels of liver transaminase levels: Secondary | ICD-10-CM | POA: Diagnosis not present

## 2022-01-25 DIAGNOSIS — R978 Other abnormal tumor markers: Secondary | ICD-10-CM | POA: Insufficient documentation

## 2022-01-25 DIAGNOSIS — C7951 Secondary malignant neoplasm of bone: Secondary | ICD-10-CM | POA: Insufficient documentation

## 2022-01-25 DIAGNOSIS — Z170 Estrogen receptor positive status [ER+]: Secondary | ICD-10-CM | POA: Diagnosis not present

## 2022-01-25 DIAGNOSIS — C50112 Malignant neoplasm of central portion of left female breast: Secondary | ICD-10-CM | POA: Diagnosis present

## 2022-01-25 MED ORDER — CAPECITABINE 500 MG PO TABS
ORAL_TABLET | ORAL | 0 refills | Status: DC
  Filled 2022-01-25: qty 120, fill #0
  Filled 2022-01-25 (×3): qty 120, 30d supply, fill #0

## 2022-01-25 MED ORDER — UNABLE TO FIND
3 refills | Status: AC
Start: 2022-01-25 — End: ?

## 2022-01-25 NOTE — Progress Notes (Signed)
?[facility]  ?[phone] [phone]  ? ? ? ?ID: [name] DOB: 1956-07-23  MR#: [id number]  OMB#:[id number] ? ?Patient Care Team: ?Binnie Rail, MD as PCP - General (Internal Medicine) ?Aloha Gell, MD as Consulting Physician (Obstetrics and Gynecology) ?Sydnee Levans, MD (Dermatology) ?Magrinat, Virgie Dad, MD (Inactive) as Consulting Physician (Oncology) ?Richmond Campbell, MD as Consulting Physician (Gastroenterology) ?Stefanie Libel, MD as Consulting Physician ([place]) ?Hilts, Legrand Como, MD (Family Medicine) ?Erline Levine, MD as Consulting Physician (Neurosurgery) ?Dallie Piles, MD as Referring Physician (Orthopedic Surgery) ?Debbra Riding, MD as Consulting Physician (Ophthalmology) ?Raina Mina, RPH-CPP (Pharmacist) ?Benay Pike, MD ?OTHER MD: ? ?CHIEF COMPLAINT: Stage IV lobular breast cancer ? ?CURRENT TREATMENT: capecitabine at metronomic doses ? ? ?INTERVAL HISTORY: ? ?Brittany [name] returns today for follow up of her stage IV lobular breast cancer.  ? ?She has been on and off capecitabine since 02/25/2020 at variably low doses due to palmar plantar erythrodysesthesia.   ?Since her last visit she had imaging of the chest as well as the spine which was her usual imaging back done ordered by Dr. Jana Hakim.  There is no evidence of disease progression on the imaging however she appears to have increased transaminitis and progression of tumor marker. ?She gained about 6/7 lbs since the beginning of the year, a bit more constipated and has a cold sore on upper lip. ?No change in breathing. NO change in urinary habits. ?No neurological changes ?Lymphedema of the left arm has been getting worse, she also noticed left shoulder pain lately. She wonders if she can go through shoulder replacement but then wonders if she doesn't have much life expectancy, if its even worth going through. ?Rest of the pertinent 10 point ROS reviewed and negative. ? ?Lab  Results  ?Component Value Date  ? GT3646 115.8 (H) 01/21/2022  ? CA2729 81.3 (H) 12/24/2021  ? CA2729 58.2 (H) 11/26/2021  ? OE3212 36.3 10/28/2021  ? CA2729 39.8 (H) 09/16/2021  ? ?Lab Results  ?Component Value Date  ? CEA1 148.02 (H) 01/21/2022  ? CEA1 30.37 (H) 12/24/2021  ? CEA1 10.55 (H) 11/26/2021  ? CEA1 8.05 (H) 10/28/2021  ? CEA1 7.45 (H) 09/16/2021  ? ? ?REVIEW OF SYSTEMS: ? ? ? COVID 19 VACCINATION STATUS: Status post Pfizer x2 with booster August 2021 ? ? ?HISTORY OF CURRENT ILLNESS: ?From the original intake note: ? ?"[name]" Dirusso has a history of left breast cancer dating back to 18.  She was followed by Dr. Beryle Beams and underwent lumpectomy, adjuvant chemotherapy, radiation and "hormonal therapy".  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  She took tamoxifen for many years and then raloxifene. ? ?Dr. Beryle Beams also evaluated her for leukopenia which was noted at first in 2011, worked up with a negative ANA and negative rheumatoid factors.  He felt it was a benign residual from her earlier chemotherapy.  She was released from follow-up in 2015. ? ?She has continued on intensified screening because of her family history.  On November 23, 2017 she had bilateral breast MRIs which were unremarkable.  Mammography November 20, 2018 showed no findings suspicious of malignancy.  Breast MRI 10/30/2019 showed a new irregular 0.4 cm enhancing focus in the upper inner quadrant of the right breast.  The left breast continued to be unremarkable except for postoperative changes and there were no abnormal appearing lymph nodes. ? ?  However in that same MRI multiple areas of enhancement were noted in the sternum.  This had not been seen in the prior MRI. ? ?Plan right MRI biopsy 11/16/2019 was canceled as the previously demonstrated 0.4 cm focus in the right breast showed only a tortuous vein at that location. ? ?However bone scan  11/19/2019 obtained to follow-up on the sternal findings showed additional areas of concern at T12, T7-T11, L4 and ribs, as well as the sternum. ? ?The patient's subsequent history is as detailed below. ? ? ?PAST MEDICAL HISTORY: ?Past Medical History:  ?Diagnosis Date  ? ANEMIA-NOS   ? Asymptomatic varicose veins   ? Bone metastases ([place]) 2021  ? Breast cancer ([place]) 1996  ? LEFT BREAST CA  ? BREAST CANCER, HX OF 01/1995 dx  ? s/p Lumpectomy, XRT, chemo and 89yrarmidex  ? Gestational diabetes mellitus in childbirth, diet controlled 1985  ? Hypothyroid 09/28/2015  ? Dx 09/2015  ? Leukopenia   ? mild since chemo  ? OSTEOARTHRITIS   ? Personal history of chemotherapy   ? Personal history of radiation therapy   ? SCOLIOSIS, MILD   ? ? ?PAST SURGICAL HISTORY: ?Past Surgical History:  ?Procedure Laterality Date  ? BREAST BIOPSY    ? BREAST LUMPECTOMY Left   ? 1996  ? left lumpectomy  1996  ? TONSILLECTOMY    ? TOTAL HIP ARTHROPLASTY    ? 2002 & 2003-Dr. v[place] [facility] ? ? ?FAMILY HISTORY ?Family History  ?Problem Relation Age of Onset  ? Heart disease Father 55 ?     AMI  ? Hyperlipidemia Father   ? Valvular heart disease Father 786 ? Parkinson's disease Father 736 ? Arthritis Mother   ? Breast cancer Mother 538 ? Hyperlipidemia Mother   ? Osteoporosis Mother   ? Pancreatic cancer Maternal Grandmother 73  ?     presumed dx  ? Diabetes Paternal Grandfather   ?     type 2  ? Breast cancer Maternal Aunt   ?The patient's father died at the age of 835from heart disease.  He had Lewy body dementia.  The patient's mother is 832years old as of January 2021.  She is in a memory unit.  She has a history of breast cancer diagnosed in her 515s(ductal carcinoma in situ).  The patient's mother sister had breast cancer diagnosed around age 68  The patient's mother's mother also had cancer, "internal" (possibly ovarian or gastrointestinal).  There is no cancer on the father's side of the family. ? ? ?GYNECOLOGIC HISTORY:  ?No LMP recorded.  Patient is postmenopausal. ?Menarche: 66years old ?Age at first live birth: 66years old ?GX P 2 ?LMP with chemotherapy, in 1996 ?Contraceptive a little over a year, with no complications ?HRT no  ?Hysterectomy?  No ?Salpingo-oophorectomy?  No ? ? ?SOCIAL HISTORY:  ?DShaliegraduated from [employer] a degree in psychology and IT.  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  They both finally retired 03/28/2020.  Their daughter JAnderson Maltais a hPharmacist, hospitalin [place]  Their son JJenny Reichmannworks for the same wRyland Groupthat the patient worked for.  Note that Jenny Reichmann did the [employer].  The patient has 1 grandchild (in [place]).  The patient attends [employer] ?  ? ADVANCED DIRECTIVES: The patient's husband is her healthcare power of attorney ? ? ?HEALTH MAINTENANCE: ?Social History  ? ?Tobacco Use  ? Smoking status: Former  ?  Types: Cigarettes  ?  Quit date: 06/25/1977  ?  Years since quitting: 44.6  ? Smokeless tobacco: Never  ?Substance Use Topics  ? Alcohol use: Yes  ?  Alcohol/week: 3.0 - 4.0 standard drinks  ?  Types: 3 - 4 Glasses of wine per week  ? Drug use: No  ? ? ? Colonoscopy: 2015 ? PAP: Up-to-date ? Bone density: 11/20/2018, normal ?  ?No Known Allergies ? ?Current Outpatient Medications  ?Medication Sig Dispense Refill  ? acetaminophen (TYLENOL) 500 MG tablet Take 1 tablet (500 mg total) by mouth in the morning, at noon, and at bedtime. Take with aleve 220 mg 90 tablet 0  ? Calcium Citrate 250 MG TABS Take 4 tablets (1,000 mg total) by mouth daily.  0  ? capecitabine (XELODA) 500 MG tablet Take 2 tablets (1000 mg) daily Monday through Friday, skip Saturdays and Sundays. 120 tablet 0  ? Cholecalciferol (VITAMIN D3)  1000 UNITS CAPS Take by mouth daily.    ? Denosumab (XGEVA [place]) Inject into the skin.    ? diclofenac sodium (VOLTAREN) 1 % GEL Apply 2 g topically 3 (three) times daily as needed. 100 g 1  ? gabape

## 2022-02-04 ENCOUNTER — Other Ambulatory Visit (HOSPITAL_COMMUNITY): Payer: Self-pay

## 2022-02-04 ENCOUNTER — Encounter: Payer: Self-pay | Admitting: Oncology

## 2022-02-08 ENCOUNTER — Encounter: Payer: Self-pay | Admitting: Hematology and Oncology

## 2022-02-11 ENCOUNTER — Ambulatory Visit (HOSPITAL_COMMUNITY)
Admission: RE | Admit: 2022-02-11 | Discharge: 2022-02-11 | Disposition: A | Discharge status: Home / Self Care | Payer: Medicare Other | Source: Ambulatory Visit | Attending: Hematology and Oncology | Admitting: Hematology and Oncology

## 2022-02-11 DIAGNOSIS — C50112 Malignant neoplasm of central portion of left female breast: Secondary | ICD-10-CM | POA: Diagnosis present

## 2022-02-11 DIAGNOSIS — Z170 Estrogen receptor positive status [ER+]: Secondary | ICD-10-CM

## 2022-02-11 MED ORDER — IOHEXOL 300 MG/ML  SOLN
80.0000 mL | Freq: Once | INTRAMUSCULAR | Status: AC | PRN
  Administered 2022-02-11: 80 mL via INTRAVENOUS

## 2022-02-11 MED ORDER — SODIUM CHLORIDE (PF) 0.9 % IJ SOLN
INTRAMUSCULAR | Status: AC
  Filled 2022-02-11: qty 50

## 2022-02-12 ENCOUNTER — Emergency Department (HOSPITAL_COMMUNITY): Payer: Medicare Other

## 2022-02-12 ENCOUNTER — Encounter (HOSPITAL_COMMUNITY): Payer: Self-pay | Admitting: Emergency Medicine

## 2022-02-12 ENCOUNTER — Emergency Department (EMERGENCY_DEPARTMENT_HOSPITAL): Payer: Medicare Other

## 2022-02-12 ENCOUNTER — Ambulatory Visit (HOSPITAL_COMMUNITY)
Admission: EM | Admit: 2022-02-12 | Discharge: 2022-02-12 | Disposition: A | Discharge status: Home / Self Care | Payer: Medicare Other | Attending: Emergency Medicine | Admitting: Emergency Medicine

## 2022-02-12 ENCOUNTER — Encounter (HOSPITAL_COMMUNITY)
Admission: EM | Disposition: A | Discharge status: Home / Self Care | Payer: Self-pay | Source: Home / Self Care | Attending: Emergency Medicine

## 2022-02-12 ENCOUNTER — Other Ambulatory Visit: Payer: Self-pay

## 2022-02-12 DIAGNOSIS — Z923 Personal history of irradiation: Secondary | ICD-10-CM | POA: Diagnosis not present

## 2022-02-12 DIAGNOSIS — Z9221 Personal history of antineoplastic chemotherapy: Secondary | ICD-10-CM | POA: Insufficient documentation

## 2022-02-12 DIAGNOSIS — M1990 Unspecified osteoarthritis, unspecified site: Secondary | ICD-10-CM | POA: Insufficient documentation

## 2022-02-12 DIAGNOSIS — T84021A Dislocation of internal left hip prosthesis, initial encounter: Secondary | ICD-10-CM

## 2022-02-12 DIAGNOSIS — X509XXA Other and unspecified overexertion or strenuous movements or postures, initial encounter: Secondary | ICD-10-CM | POA: Insufficient documentation

## 2022-02-12 DIAGNOSIS — C7951 Secondary malignant neoplasm of bone: Secondary | ICD-10-CM | POA: Insufficient documentation

## 2022-02-12 DIAGNOSIS — Z87891 Personal history of nicotine dependence: Secondary | ICD-10-CM | POA: Diagnosis not present

## 2022-02-12 DIAGNOSIS — E039 Hypothyroidism, unspecified: Secondary | ICD-10-CM | POA: Insufficient documentation

## 2022-02-12 DIAGNOSIS — S73005A Unspecified dislocation of left hip, initial encounter: Secondary | ICD-10-CM | POA: Diagnosis present

## 2022-02-12 DIAGNOSIS — I700 Atherosclerosis of aorta: Secondary | ICD-10-CM | POA: Insufficient documentation

## 2022-02-12 DIAGNOSIS — Z853 Personal history of malignant neoplasm of breast: Secondary | ICD-10-CM | POA: Diagnosis not present

## 2022-02-12 DIAGNOSIS — Z79890 Hormone replacement therapy (postmenopausal): Secondary | ICD-10-CM | POA: Diagnosis not present

## 2022-02-12 DIAGNOSIS — Z79899 Other long term (current) drug therapy: Secondary | ICD-10-CM | POA: Diagnosis not present

## 2022-02-12 HISTORY — PX: HIP CLOSED REDUCTION: SHX983

## 2022-02-12 HISTORY — DX: Other complications of anesthesia, initial encounter: T88.59XA

## 2022-02-12 LAB — GLUCOSE, CAPILLARY: Glucose-Capillary: 90 mg/dL (ref 70–99)

## 2022-02-12 SURGERY — CLOSED MANIPULATION, JOINT, HIP
Anesthesia: General | Site: Hip

## 2022-02-12 MED ORDER — CHLORHEXIDINE GLUCONATE 0.12 % MT SOLN
15.0000 mL | Freq: Once | OROMUCOSAL | Status: AC
  Administered 2022-02-12: 15 mL via OROMUCOSAL

## 2022-02-12 MED ORDER — LACTATED RINGERS IV SOLN: INTRAVENOUS | Status: DC

## 2022-02-12 MED ORDER — ONDANSETRON HCL 4 MG/2ML IJ SOLN
INTRAMUSCULAR | Status: DC | PRN
  Administered 2022-02-12: 4 mg via INTRAVENOUS

## 2022-02-12 MED ORDER — PROPOFOL 10 MG/ML IV BOLUS
INTRAVENOUS | Status: DC | PRN
  Administered 2022-02-12: 100 mg via INTRAVENOUS

## 2022-02-12 MED ORDER — KETAMINE HCL 50 MG/5ML IJ SOSY
1.0000 mg/kg | PREFILLED_SYRINGE | Freq: Once | INTRAMUSCULAR | Status: DC
Start: 2022-02-12 — End: 2022-02-12
  Filled 2022-02-12: qty 10

## 2022-02-12 MED ORDER — PHENYLEPHRINE HCL (PRESSORS) 10 MG/ML IV SOLN
INTRAVENOUS | Status: DC | PRN
  Administered 2022-02-12 (×2): 80 ug via INTRAVENOUS

## 2022-02-12 MED ORDER — FENTANYL CITRATE PF 50 MCG/ML IJ SOSY
50.0000 ug | PREFILLED_SYRINGE | INTRAMUSCULAR | Status: DC | PRN
  Administered 2022-02-12: 50 ug via INTRAVENOUS
  Filled 2022-02-12: qty 1

## 2022-02-12 MED ORDER — LIDOCAINE 2% (20 MG/ML) 5 ML SYRINGE
INTRAMUSCULAR | Status: DC | PRN
  Administered 2022-02-12: 60 mg via INTRAVENOUS

## 2022-02-12 MED ORDER — DEXAMETHASONE SODIUM PHOSPHATE 10 MG/ML IJ SOLN
INTRAMUSCULAR | Status: DC | PRN
  Administered 2022-02-12: 7 mg via INTRAVENOUS

## 2022-02-12 MED ORDER — FENTANYL CITRATE (PF) 100 MCG/2ML IJ SOLN
INTRAMUSCULAR | Status: AC
  Filled 2022-02-12: qty 2

## 2022-02-12 MED ORDER — FENTANYL CITRATE PF 50 MCG/ML IJ SOSY: 25.0000 ug | PREFILLED_SYRINGE | INTRAMUSCULAR | Status: DC | PRN

## 2022-02-12 MED ORDER — KETAMINE HCL 10 MG/ML IJ SOLN
INTRAMUSCULAR | Status: DC | PRN
  Administered 2022-02-12: 30 mg via INTRAVENOUS

## 2022-02-12 MED ORDER — LACTATED RINGERS IV BOLUS
1000.0000 mL | Freq: Once | INTRAVENOUS | Status: AC
Start: 2022-02-12 — End: 2022-02-12
  Administered 2022-02-12: 1000 mL via INTRAVENOUS

## 2022-02-12 MED ORDER — PROPOFOL 10 MG/ML IV BOLUS
0.5000 mg/kg | Freq: Once | INTRAVENOUS | Status: DC
  Filled 2022-02-12: qty 20

## 2022-02-12 MED ORDER — METHOCARBAMOL 500 MG IVPB - SIMPLE MED: 500.0000 mg | Freq: Four times a day (QID) | INTRAVENOUS | Status: DC | PRN

## 2022-02-12 MED ORDER — PROPOFOL 10 MG/ML IV BOLUS
INTRAVENOUS | Status: AC | PRN
  Administered 2022-02-12 (×2): 20 mg via INTRAVENOUS

## 2022-02-12 MED ORDER — LACTATED RINGERS IV SOLN: INTRAVENOUS | Status: DC | PRN

## 2022-02-12 MED ORDER — MIDAZOLAM HCL 2 MG/2ML IJ SOLN
INTRAMUSCULAR | Status: AC
  Filled 2022-02-12: qty 2

## 2022-02-12 MED ORDER — TRAMADOL HCL 50 MG PO TABS: 50.0000 mg | ORAL_TABLET | Freq: Four times a day (QID) | ORAL | 0 refills | Status: DC | PRN

## 2022-02-12 MED ORDER — METHOCARBAMOL 500 MG PO TABS: 500.0000 mg | ORAL_TABLET | Freq: Four times a day (QID) | ORAL | Status: DC | PRN

## 2022-02-12 MED ORDER — ROCURONIUM BROMIDE 10 MG/ML (PF) SYRINGE
PREFILLED_SYRINGE | INTRAVENOUS | Status: DC | PRN
  Administered 2022-02-12: 24 mg via INTRAVENOUS

## 2022-02-12 MED ORDER — FENTANYL CITRATE (PF) 100 MCG/2ML IJ SOLN
INTRAMUSCULAR | Status: DC | PRN
  Administered 2022-02-12: 50 ug via INTRAVENOUS

## 2022-02-12 MED ORDER — SUCCINYLCHOLINE CHLORIDE 200 MG/10ML IV SOSY
PREFILLED_SYRINGE | INTRAVENOUS | Status: DC | PRN
  Administered 2022-02-12: 100 mg via INTRAVENOUS

## 2022-02-12 MED ORDER — MIDAZOLAM HCL 5 MG/5ML IJ SOLN
INTRAMUSCULAR | Status: DC | PRN
  Administered 2022-02-12: 1 mg via INTRAVENOUS

## 2022-02-12 NOTE — Anesthesia Procedure Notes (Signed)
Procedure Name: Intubation ?Date/Time: 02/12/2022 4:25 PM ?Performed by: Lissa Morales, CRNA ?Pre-anesthesia Checklist: Patient identified, Emergency Drugs available, Suction available and Patient being monitored ?Patient Re-evaluated:Patient Re-evaluated prior to induction ?Oxygen Delivery Method: Circle system utilized ?Preoxygenation: Pre-oxygenation with 100% oxygen ?Induction Type: IV induction ?Ventilation: Mask ventilation without difficulty ?Laryngoscope Size: Mac and 4 ?Grade View: Grade II ?Tube type: Oral ?Tube size: 7.0 mm ?Number of attempts: 1 ?Airway Equipment and Method: Stylet and Oral airway ?Placement Confirmation: ETT inserted through vocal cords under direct vision, positive ETCO2 and breath sounds checked- equal and bilateral ?Secured at: 23 cm ?Tube secured with: Tape ?Dental Injury: Teeth and Oropharynx as per pre-operative assessment  ? ? ? ? ?

## 2022-02-12 NOTE — ED Triage Notes (Signed)
Pt BIBA from home c/o possible L hip dislocation. Patient reports feeling hip come out of place while attempting to place something on a bookshelf. Denies falls. Deformity noted by EMS.  ? ?50 mcg Fentanyl given en route. ? ?BP 132/84 ?HR 74 ?SpO2 99% RA ? ?20 g RF  ?

## 2022-02-12 NOTE — Anesthesia Postprocedure Evaluation (Signed)
Anesthesia Post Note ? ?Patient: Brittany [name] ? ?Procedure(s) Performed: CLOSED MANIPULATION HIP (Hip) ? ?  ? ?Patient location during evaluation: PACU ?Anesthesia Type: General ?Level of consciousness: awake and alert ?Pain management: pain level controlled ?Vital Signs Assessment: post-procedure vital signs reviewed and stable ?Respiratory status: spontaneous breathing, nonlabored ventilation and respiratory function stable ?Cardiovascular status: blood pressure returned to baseline and stable ?Postop Assessment: no apparent nausea or vomiting ?Anesthetic complications: no ? ? ?No notable events documented. ? ?Last Vitals:  ?Vitals:  ? 02/12/22 1717 02/12/22 1730  ?BP: 125/82 132/75  ?Pulse: 70 71  ?Resp: 16 11  ?Temp: (!) 35.7 ?C   ?SpO2: 98% 99%  ?  ?Last Pain:  ?Vitals:  ? 02/12/22 1730  ?TempSrc:   ?PainSc: 0-No pain  ? ? ?  ?  ?  ?  ?  ?  ? ?Jamilex Bohnsack,W. EDMOND ? ? ? ? ?

## 2022-02-12 NOTE — Anesthesia Preprocedure Evaluation (Addendum)
Anesthesia Evaluation  ?Patient identified by MRN, date of birth, ID band ?Patient awake ? ? ? ?Reviewed: ?Allergy & Precautions, H&P , NPO status , Patient's Chart, lab work & pertinent test results ? ?Airway ?Mallampati: III ? ?TM Distance: >3 FB ?Neck ROM: Full ? ? ? Dental ?no notable dental hx. ?(+) Teeth Intact, Dental Advisory Given ?  ?Pulmonary ?neg pulmonary ROS, former smoker,  ?  ?Pulmonary exam normal ?breath sounds clear to auscultation ? ? ? ? ? ? Cardiovascular ?negative cardio ROS ? ? ?Rhythm:Regular Rate:Normal ? ? ?  ?Neuro/Psych ?negative neurological ROS ? negative psych ROS  ? GI/Hepatic ?negative GI ROS, Neg liver ROS,   ?Endo/Other  ?diabetesHypothyroidism  ? Renal/GU ?negative Renal ROS  ?negative genitourinary ?  ?Musculoskeletal ? ?(+) Arthritis , Osteoarthritis,   ? Abdominal ?  ?Peds ? Hematology ? ?(+) Blood dyscrasia, anemia ,   ?Anesthesia Other Findings ? ? Reproductive/Obstetrics ?negative OB ROS ? ?  ? ? ? ? ? ? ? ? ? ? ? ? ? ?  ?  ? ? ? ? ? ? ? ?Anesthesia Physical ?Anesthesia Plan ? ?ASA: 2 and emergent ? ?Anesthesia Plan: General  ? ?Post-op Pain Management:   ? ?Induction: Intravenous, Rapid sequence and Cricoid pressure planned ? ?PONV Risk Score and Plan: 4 or greater and Ondansetron and Dexamethasone ? ?Airway Management Planned: Oral ETT ? ?Additional Equipment:  ? ?Intra-op Plan:  ? ?Post-operative Plan: Extubation in OR ? ?Informed Consent: I have reviewed the patients History and Physical, chart, labs and discussed the procedure including the risks, benefits and alternatives for the proposed anesthesia with the patient or authorized representative who has indicated his/her understanding and acceptance.  ? ? ? ?Dental advisory given ? ?Plan Discussed with: CRNA ? ?Anesthesia Plan Comments:   ? ? ? ? ? ? ?Anesthesia Quick Evaluation ? ?

## 2022-02-12 NOTE — ED Provider Notes (Signed)
?  Physical Exam  ?BP 135/69 (BP Location: Right Arm)   Pulse 78   Temp 97.7 ?F (36.5 ?C) (Oral)   Resp 19   Ht 1.702 m ('5\' 7"'$ )   Wt 64.9 kg   SpO2 99%   BMI 22.40 kg/m?  ? ?Physical Exam ? ?Procedures  ?.Sedation ? ?Date/Time: 02/12/2022 2:39 PM ?Performed by: Lacretia Leigh, MD ?Authorized by: Lacretia Leigh, MD  ? ?Consent:  ?  Consent obtained:  Written ?  Consent given by:  Patient ?Universal protocol:  ?  Immediately prior to procedure, a time out was called: yes   ?Indications:  ?  Procedure performed:  Dislocation reduction ?  Procedure necessitating sedation performed by:  Different physician ?Pre-sedation assessment:  ?  Time since last food or drink:  5 ?  NPO status caution: unable to specify NPO status   ?  ASA classification: class 1 - normal, healthy patient   ?  Mouth opening:  3 or more finger widths ?  Mallampati score:  I - soft palate, uvula, fauces, pillars visible ?  Neck mobility: normal   ?  Pre-sedation assessments completed and reviewed: airway patency and cardiovascular function   ?  Pre-sedation assessment completed:  02/12/2022 2:25 PM ?Immediate pre-procedure details:  ?  Reassessment: Patient reassessed immediately prior to procedure   ?  Verified: bag valve mask available, emergency equipment available and intubation equipment available   ?Procedure details (see MAR for exact dosages):  ?  Preoxygenation:  Nasal cannula ?  Sedation:  Propofol and ketamine ?  Intended level of sedation: deep ?  Intra-procedure monitoring:  Blood pressure monitoring, cardiac monitor, continuous pulse oximetry and continuous capnometry ?  Intra-procedure events: respiratory depression   ?  Intra-procedure management:  Airway repositioning and BVM ventilation ?  Total Provider sedation time (minutes):  15 ?Post-procedure details:  ?  Post-sedation assessment completed:  02/12/2022 2:41 PM ?  Attendance: Constant attendance by certified staff until patient recovered   ?  Recovery: Patient returned to  pre-procedure baseline   ?  Post-sedation assessments completed and reviewed: airway patency and cardiovascular function   ?  Patient is stable for discharge or admission: yes   ? ?ED Course / MDM  ?  ?Medical Decision Making ?Amount and/or Complexity of Data Reviewed ?Radiology: ordered. ? ?Risk ?Prescription drug management. ? ? ? ? ? ? ? ?  ?Lacretia Leigh, MD ?02/12/22 1442 ? ?

## 2022-02-12 NOTE — Transfer of Care (Signed)
Immediate Anesthesia Transfer of Care Note ? ?Patient: Brittany [name] ? ?Procedure(s) Performed: CLOSED MANIPULATION HIP (Hip) ? ?Patient Location: PACU ? ?Anesthesia Type:General ? ?Level of Consciousness: awake, alert , oriented and patient cooperative ? ?Airway & Oxygen Therapy: Patient Spontanous Breathing and Patient connected to face mask oxygen ? ?Post-op Assessment: Report given to RN, Post -op Vital signs reviewed and stable and Patient moving all extremities X 4 ? ?Post vital signs: stable ? ?Last Vitals:  ?Vitals Value Taken Time  ?BP 125/82 02/12/22 1717  ?Temp 35.7 ?C 02/12/22 1717  ?Pulse 67 02/12/22 1728  ?Resp 14 02/12/22 1728  ?SpO2 98 % 02/12/22 1728  ?Vitals shown include unvalidated device data. ? ?Last Pain:  ?Vitals:  ? 02/12/22 1527  ?TempSrc: Oral  ?PainSc:   ?   ? ?  ? ?Complications: No notable events documented. ?

## 2022-02-12 NOTE — ED Provider Notes (Addendum)
?[facility] DEPT ?Provider Note ? ? ?CSN: [id number] ?Arrival date & time: 02/12/22  1226 ? ?  ? ?History ? ?Chief Complaint  ?Patient presents with  ? Hip Pain  ? ? ?Brittany [name] is a 66 y.o. female. ? ?Patient is a 66 year old female with a history of metastatic breast cancer who has recently discontinued chemotherapy due to reaction to her hands and feet, hypothyroidism, bilateral hip replacements over 20 years ago who presents today with severe pain in her left hip and deformity.  Patient reports that she was down on her hands and knees reaching for a book in her left knee bent inward and she immediately felt severe pain.  She was not able to stand or straighten her leg.  She denies ever having issues with this hip in the past.  She denies any numbness or tingling of her foot.  She had the hip replaced in [facility] all those years ago.  She did receive fentanyl by EMS on the way in and reports her pain is improved. ? ?The history is provided by the patient.  ?Hip Pain ? ? ?  ? ?Home Medications ?Prior to Admission medications   ?Medication Sig Start Date End Date Taking? Authorizing Provider  ?acetaminophen (TYLENOL) 500 MG tablet Take 1 tablet (500 mg total) by mouth in the morning, at noon, and at bedtime. Take with aleve 220 mg 01/15/21   Magrinat, Virgie Dad, MD  ?Calcium Citrate 250 MG TABS Take 4 tablets (1,000 mg total) by mouth daily. 09/14/13   Rowe Clack, MD  ?capecitabine (XELODA) 500 MG tablet Take 2 tablets (1000 mg) by mouth twice daily 01/25/22   Benay Pike, MD  ?Cholecalciferol (VITAMIN D3) 1000 UNITS CAPS Take by mouth daily.    [provider]  ?Denosumab (XGEVA [place]) Inject into the skin.    [provider]  ?diclofenac sodium (VOLTAREN) 1 % GEL Apply 2 g topically 3 (three) times daily as needed. 06/01/18   Stefanie Libel, MD  ?gabapentin (NEURONTIN) 300 MG capsule Take 1 capsule (300 mg total) by mouth 3 (three) times daily. 09/30/21    Magrinat, Virgie Dad, MD  ?levothyroxine (SYNTHROID) 50 MCG tablet TAKE ONE TABLET BY MOUTH DAILY 12/07/21   Binnie Rail, MD  ?Multiple Vitamin (MULTIVITAMIN) tablet Take 1 tablet by mouth daily.    [provider]  ?naproxen sodium (ALEVE) 220 MG tablet Take 1 tablet (220 mg total) by mouth in the morning, at noon, and at bedtime. Take with tylenol 500 mg 01/15/21   Magrinat, Virgie Dad, MD  ?Omega-3 Fatty Acids (FISH OIL) 1000 MG CPDR Take by mouth daily.    [provider]  ?UNABLE TO FIND Med Name : sildenafil 2% cream apply to hands and feet bid prn for CIPN 01/25/22   Benay Pike, MD  ?   ? ?Allergies    ?Patient has no known allergies.   ? ?Review of Systems   ?Review of Systems ? ?Physical Exam ?Updated Vital Signs ?BP 135/69 (BP Location: Right Arm)   Pulse 78   Temp 97.7 ?F (36.5 ?C) (Oral)   Resp 19   Ht '5\' 7"'$  (1.702 m)   Wt 64.9 kg   SpO2 99%   BMI 22.40 kg/m?  ?Physical Exam ?Vitals and nursing note reviewed.  ?Constitutional:   ?   General: She is not in acute distress. ?   Appearance: She is well-developed.  ?HENT:  ?   Head: Normocephalic and atraumatic.  ?  Eyes:  ?   Pupils: Pupils are equal, round, and reactive to light.  ?Cardiovascular:  ?   Rate and Rhythm: Normal rate and regular rhythm.  ?   Heart sounds: Normal heart sounds. No murmur heard. ?  No friction rub.  ?Pulmonary:  ?   Effort: Pulmonary effort is normal.  ?   Breath sounds: Normal breath sounds. No wheezing or rales.  ?Abdominal:  ?   General: Bowel sounds are normal. There is no distension.  ?   Palpations: Abdomen is soft.  ?   Tenderness: There is no abdominal tenderness. There is no guarding or rebound.  ?Musculoskeletal:     ?   General: No tenderness. Normal range of motion.  ?   Comments: Left hip is held in a flexed mildly internally rotated position.  Patient will not move it due to pain.  2+ DP pulses bilaterally with no swelling in the lower extremities.  Right hip is normal  ?Skin: ?   General:  Skin is warm and dry.  ?   Findings: No rash.  ?   Comments: Peeling skin noted on bilateral hands with bandages intact  ?Neurological:  ?   Mental Status: She is alert and oriented to person, place, and time. Mental status is at baseline.  ?   Cranial Nerves: No cranial nerve deficit.  ?Psychiatric:     ?   Mood and Affect: Mood normal.     ?   Behavior: Behavior normal.  ? ? ?ED Results / Procedures / Treatments   ?Labs ?(all labs ordered are listed, but only abnormal results are displayed) ?Labs Reviewed - No data to display ? ?EKG ?None ? ?Radiology ?CT Abdomen Pelvis W Contrast ? ?Result Date: 02/12/2022 ?CLINICAL DATA:  Breast cancer, stage IV concern for liver disease in the setting of elevated LFTs. * Tracking Code: BO * EXAM: CT ABDOMEN AND PELVIS WITH CONTRAST TECHNIQUE: Multidetector CT imaging of the abdomen and pelvis was performed using the standard protocol following bolus administration of intravenous contrast. RADIATION DOSE REDUCTION: This exam was performed according to the departmental dose-optimization program which includes automated exposure control, adjustment of the mA and/or kV according to patient size and/or use of iterative reconstruction technique. CONTRAST:  18m OMNIPAQUE IOHEXOL 300 MG/ML  SOLN COMPARISON:  Multiple prior studies most recent is a chest CT from January 03, 2021. FINDINGS: Lower chest: Trace RIGHT effusion.  No consolidation. Hepatobiliary: New hypodense lesion in the RIGHT hemiliver (image 12/2) this is new compared to the February of 2021 evaluation. Subtle abnormality perhaps present in March in this location though the assessment was limited secondary to arterial phase which was acquired at that time. Nodular hepatic contours. Variable hepatic enhancement. Showing heterogeneity in the LEFT hepatic lobe, lateral segment. Mild heterogeneity along the margin of the RIGHT hepatic lobe, nodular hepatic contours. Suggestion of periportal edema. No pericholecystic stranding,  gallbladder decompressed. No biliary duct distension. Trace ascites along the liver margin. Recanalization of the umbilical vein though without dilation. Spleen mildly enlarged. Portal vein is patent. Pancreas: Areas of fatty invagination along the pancreatic margin. Low-density area in the head of the pancreas/uncinate (image 32/2) this measures 8 mm and is unchanged compared to February of 2021. No pancreatic inflammation or sign of ductal dilation. Spleen: Spleen at approximately 12 cm greatest craniocaudal dimension previously 10 cm. Adrenals/Urinary Tract: Adrenal glands are unremarkable. Symmetric renal enhancement. No sign of hydronephrosis. No suspicious renal lesion or perinephric stranding. Urinary bladder is grossly unremarkable.  Markedly limited assessment however of the urinary bladder due to streak artifact in this location. Stomach/Bowel: Abundant stool in the colon mixed with contrast in the proximal colon. Appendix is normal. No sign of bowel obstruction. Small hiatal hernia. Vascular/Lymphatic: Aortic atherosclerosis. No sign of aneurysm. Smooth contour of the IVC. There is no gastrohepatic or hepatoduodenal ligament lymphadenopathy. No retroperitoneal or mesenteric lymphadenopathy. No pelvic sidewall lymphadenopathy. Atherosclerotic changes are mild. Reproductive: Limited assessment of reproductive structures due to abundant streak artifact in the pelvis. Hypodensity in the area of the cervix may represent a pseudo lesion secondary to streak artifact in this location but cannot be further assessed. Other: Small volume ascites has developed since previous imaging. Of note liver contour nodularity is also a new finding. Musculoskeletal: Diffuse skeletal metastatic disease with increased sclerosis which is nearly confluent compared to previous imaging of the abdomen and pelvis from February of 2021 but is in line with what is seen on a recent chest CT from March of 2023. Subacute rib fractures are  noted about the RIGHT chest as noted on previous chest CT. IMPRESSION: Signs of nodular hepatic contour, slight increase in splenic size, small volume ascites and developing portosystemic collaterals suspicious for deve

## 2022-02-12 NOTE — Op Note (Signed)
Preoperative diagnosis: Dislocated left total hip arthroplasty ? ?Postoperative diagnosis: Same ? ?Procedure performed: Closed reduction under anesthesia left total hip arthroplasty ? ?Attending surgeon: Isabella Stalling, MD ? ?Assistant surgeons: Zollie Beckers, MD, Victorino December, MD ? ?Complications: None ? ?Procedure: The patient was identified in the preoperative holding area where I personally marked the operative site after verifying site side and procedure with the patient.  She was taken back to the operating room where general anesthesia was induced.  She was moved to the operative table and countertraction was placed on the pelvis well the hip was flexed anteriorly and traction was applied.  With gentle internal/external rotation there was no palpable reduction of the hip.  Attempts were then made with longitudinal traction as well.  With fluoroscopic imaging it appeared to be perched on the lip but never able to be reduced.  At this point Dr. Stann Mainland came into the room to help.  He was also unsuccessful in reducing the hip.  We spoke with Dr. Ninfa Linden on the phone who recommended we try traction on the [facility] table.  The Hana table was brought into the room and the patient was placed on the Hana bed.  Traction was applied.  Again although the hip could be perched it could not be reduced.  Finally additional counterpressure was placed over the ASIS and with significant upward force with the knee bent there was a satisfactory reduction.  The hip was then felt to be stable and fluoroscopic imaging demonstrated reduction of the hip with no apparent fracture. ? ?The patient was then placed in a knee immobilizer, awakened from anesthesia and transferred to the stretcher and taken to the recovery room in stable condition. ? ?Postoperative plan: She will be discharged home today with family and will follow-up at [facility] where she had her hip replaced. ?

## 2022-02-12 NOTE — H&P (Signed)
Brittany [name] is an 66 y.o. female.   ?Chief Complaint: Left hip dislocation ?HPI: History of left total hip arthroplasty done about 20 years ago at [facility].  She was on her hands and knees today and reached out and felt her knee collapsed inward and sudden sharp pain in her hip.  She was found on x-ray in the emergency department to have a dislocated left total hip.  Attempts were made in the emergency department under anesthesia to reduce the hip and were unsuccessful. ? ?Past Medical History:  ?Diagnosis Date  ? ANEMIA-NOS   ? Asymptomatic varicose veins   ? Bone metastases 2021  ? Breast cancer ([place]) 1996  ? LEFT BREAST CA  ? BREAST CANCER, HX OF 01/1995 dx  ? s/p Lumpectomy, XRT, chemo and 33yrarmidex  ? Complication of anesthesia   ? Gestational diabetes mellitus in childbirth, diet controlled 1985  ? Hypothyroid 09/28/2015  ? Dx 09/2015  ? Leukopenia   ? mild since chemo  ? OSTEOARTHRITIS   ? Personal history of chemotherapy   ? Personal history of radiation therapy   ? SCOLIOSIS, MILD   ? ? ?Past Surgical History:  ?Procedure Laterality Date  ? BREAST BIOPSY    ? BREAST LUMPECTOMY Left   ? 1996  ? left lumpectomy  1996  ? TONSILLECTOMY    ? TOTAL HIP ARTHROPLASTY    ? 2002 & 2003-Dr. vail @ [facility] ? ? ?Family History  ?Problem Relation Age of Onset  ? Heart disease Father 532 ?     AMI  ? Hyperlipidemia Father   ? Valvular heart disease Father 718 ? Parkinson's disease Father 773 ? Arthritis Mother   ? Breast cancer Mother 586 ? Hyperlipidemia Mother   ? Osteoporosis Mother   ? Pancreatic cancer Maternal Grandmother 73  ?     presumed dx  ? Diabetes Paternal Grandfather   ?     type 2  ? Breast cancer Maternal Aunt   ? ?Social History:  reports that she quit smoking about 44 years ago. Her smoking use included cigarettes. She has never used smokeless tobacco. She reports current alcohol use of about 3.0 - 4.0 standard drinks per week. She reports that she does not use drugs. ? ?Allergies: No Known  Allergies ? ?Medications Prior to Admission  ?Medication Sig Dispense Refill  ? calcium carbonate (TUMS - DOSED IN MG ELEMENTAL CALCIUM) 500 MG chewable tablet Chew 1 tablet by mouth daily.    ? Calcium Citrate 250 MG TABS Take 4 tablets (1,000 mg total) by mouth daily.  0  ? capecitabine (XELODA) 500 MG tablet Take 2 tablets (1000 mg) by mouth twice daily 120 tablet 0  ? Cholecalciferol (VITAMIN D3) 1000 UNITS CAPS Take by mouth daily.    ? diclofenac sodium (VOLTAREN) 1 % GEL Apply 2 g topically 3 (three) times daily as needed. 100 g 1  ? levothyroxine (SYNTHROID) 50 MCG tablet TAKE ONE TABLET BY MOUTH DAILY 90 tablet 3  ? Multiple Vitamin (MULTIVITAMIN) tablet Take 1 tablet by mouth daily.    ? naproxen sodium (ALEVE) 220 MG tablet Take 1 tablet (220 mg total) by mouth in the morning, at noon, and at bedtime. Take with tylenol 500 mg 90 tablet 4  ? Omega-3 Fatty Acids (FISH OIL) 1000 MG CPDR Take by mouth daily.    ? UNABLE TO FIND Med Name : sildenafil 2% cream apply to hands and feet bid prn for  CIPN 30 g 3  ? acetaminophen (TYLENOL) 500 MG tablet Take 1 tablet (500 mg total) by mouth in the morning, at noon, and at bedtime. Take with aleve 220 mg 90 tablet 0  ? Denosumab (XGEVA [place]) Inject into the skin.    ? gabapentin (NEURONTIN) 300 MG capsule Take 1 capsule (300 mg total) by mouth 3 (three) times daily. 270 capsule 1  ? ? ?No results found for this or any previous visit (from the past 48 hour(s)). ?CT Abdomen Pelvis W Contrast ? ?Result Date: 02/12/2022 ?CLINICAL DATA:  Breast cancer, stage IV concern for liver disease in the setting of elevated LFTs. * Tracking Code: BO * EXAM: CT ABDOMEN AND PELVIS WITH CONTRAST TECHNIQUE: Multidetector CT imaging of the abdomen and pelvis was performed using the standard protocol following bolus administration of intravenous contrast. RADIATION DOSE REDUCTION: This exam was performed according to the departmental dose-optimization program which includes automated exposure  control, adjustment of the mA and/or kV according to patient size and/or use of iterative reconstruction technique. CONTRAST:  28m OMNIPAQUE IOHEXOL 300 MG/ML  SOLN COMPARISON:  Multiple prior studies most recent is a chest CT from January 03, 2021. FINDINGS: Lower chest: Trace RIGHT effusion.  No consolidation. Hepatobiliary: New hypodense lesion in the RIGHT hemiliver (image 12/2) this is new compared to the February of 2021 evaluation. Subtle abnormality perhaps present in March in this location though the assessment was limited secondary to arterial phase which was acquired at that time. Nodular hepatic contours. Variable hepatic enhancement. Showing heterogeneity in the LEFT hepatic lobe, lateral segment. Mild heterogeneity along the margin of the RIGHT hepatic lobe, nodular hepatic contours. Suggestion of periportal edema. No pericholecystic stranding, gallbladder decompressed. No biliary duct distension. Trace ascites along the liver margin. Recanalization of the umbilical vein though without dilation. Spleen mildly enlarged. Portal vein is patent. Pancreas: Areas of fatty invagination along the pancreatic margin. Low-density area in the head of the pancreas/uncinate (image 32/2) this measures 8 mm and is unchanged compared to February of 2021. No pancreatic inflammation or sign of ductal dilation. Spleen: Spleen at approximately 12 cm greatest craniocaudal dimension previously 10 cm. Adrenals/Urinary Tract: Adrenal glands are unremarkable. Symmetric renal enhancement. No sign of hydronephrosis. No suspicious renal lesion or perinephric stranding. Urinary bladder is grossly unremarkable. Markedly limited assessment however of the urinary bladder due to streak artifact in this location. Stomach/Bowel: Abundant stool in the colon mixed with contrast in the proximal colon. Appendix is normal. No sign of bowel obstruction. Small hiatal hernia. Vascular/Lymphatic: Aortic atherosclerosis. No sign of aneurysm. Smooth  contour of the IVC. There is no gastrohepatic or hepatoduodenal ligament lymphadenopathy. No retroperitoneal or mesenteric lymphadenopathy. No pelvic sidewall lymphadenopathy. Atherosclerotic changes are mild. Reproductive: Limited assessment of reproductive structures due to abundant streak artifact in the pelvis. Hypodensity in the area of the cervix may represent a pseudo lesion secondary to streak artifact in this location but cannot be further assessed. Other: Small volume ascites has developed since previous imaging. Of note liver contour nodularity is also a new finding. Musculoskeletal: Diffuse skeletal metastatic disease with increased sclerosis which is nearly confluent compared to previous imaging of the abdomen and pelvis from February of 2021 but is in line with what is seen on a recent chest CT from March of 2023. Subacute rib fractures are noted about the RIGHT chest as noted on previous chest CT. IMPRESSION: Signs of nodular hepatic contour, slight increase in splenic size, small volume ascites and developing portosystemic collaterals suspicious  for developing cirrhosis/liver disease. Heterogeneity in the LEFT hepatic lobe is nonspecific. New small lesion in the RIGHT hemiliver could represent a metastatic focus, MRI on follow-up, preferably as an outpatient may be helpful for further evaluation. Would correlate with any current therapy which could lead to hepatic dysfunction. Given that only 1 small lesion is visible on the current study. Hypodense lesion in the pancreatic head is stable since 2021, attention on follow-up or attention on subsequent MR imaging, this displays water density and may represent a small cystic neoplasm of the pancreas. Diffuse skeletal metastatic disease. No signs of adenopathy or additional evidence of metastatic disease in the abdomen or pelvis. Electronically Signed   By: Zetta Bills M.D.   On: 02/12/2022 14:18  ? ?DG HIP UNILAT WITH PELVIS 2-3 VIEWS LEFT ? ?Result  Date: 02/12/2022 ?CLINICAL DATA:  A 67 year old female presents for evaluation of LEFT hip pain. EXAM: DG HIP (WITH OR WITHOUT PELVIS) 2-3V LEFT COMPARISON:  February 11, 2022 CT evaluation. FINDINGS: [place] and l

## 2022-02-12 NOTE — Discharge Instructions (Addendum)
Weightbearing as tolerated ?Wear knee immobilizer at all times ?Make follow up appointment with [facility] next week ?Pain medication has been sent into your pharmacy should your pain not be relieved with Tylenol or NSAIDs ?

## 2022-02-13 ENCOUNTER — Encounter (HOSPITAL_COMMUNITY): Payer: Self-pay | Admitting: Orthopedic Surgery

## 2022-02-14 NOTE — Progress Notes (Signed)
?[facility]  ?[phone] [phone]  ? ? ? ?ID: Brittany [name] DOB: 1956/06/24  MR#: [id number]  YPP#:[id number] ? ?Patient Care Team: ?Binnie Rail, MD as PCP - General (Internal Medicine) ?Aloha Gell, MD as Consulting Physician (Obstetrics and Gynecology) ?Sydnee Levans, MD (Dermatology) ?Magrinat, Virgie Dad, MD (Inactive) as Consulting Physician (Oncology) ?Richmond Campbell, MD as Consulting Physician (Gastroenterology) ?Stefanie Libel, MD as Consulting Physician ([place]) ?Hilts, Legrand Como, MD (Family Medicine) ?Erline Levine, MD as Consulting Physician (Neurosurgery) ?Dallie Piles, MD as Referring Physician (Orthopedic Surgery) ?Debbra Riding, MD as Consulting Physician (Ophthalmology) ?Raina Mina, RPH-CPP (Pharmacist) ?Benay Pike, MD ?OTHER MD: ? ?CHIEF COMPLAINT: Stage IV lobular breast cancer ? ?CURRENT TREATMENT: capecitabine at metronomic doses ? ? ?INTERVAL HISTORY: ? ?Brittany [name] returns today for follow up of her stage IV lobular breast cancer.  ? ?She has been on and off capecitabine since 02/25/2020 at variably low doses due to palmar plantar erythrodysesthesia.   ?Since last visit, she had left hip dislocation and had total hip arthroplasty ?She had imaging which showed cirrhosis, no definitive evidence of progression.  She tells me that she could not tolerate 2 tablets of Xeloda daily, had severe desquamation of the skin over her hands and her soles and had a lot of pain hence could not tolerate it.  She has been taking some Aleve for pain. ?She is healing well from her hip arthroplasty.  She denies any excessive tylenol use recently. She only drinks one glass of wine daily with dinner. ?She is hoping to travel to [place] for 2 weeks at the end of May and was wondering if she can stay off of medication so she can walk and enjoy. ?Rest of the pertinent 10 point ROS reviewed and negative. ? ?Lab Results  ?Component Value Date  ?  WP8099 115.8 (H) 01/21/2022  ? CA2729 81.3 (H) 12/24/2021  ? CA2729 58.2 (H) 11/26/2021  ? IP3825 36.3 10/28/2021  ? CA2729 39.8 (H) 09/16/2021  ? ?Lab Results  ?Component Value Date  ? CEA1 148.02 (H) 01/21/2022  ? CEA1 30.37 (H) 12/24/2021  ? CEA1 10.55 (H) 11/26/2021  ? CEA1 8.05 (H) 10/28/2021  ? CEA1 7.45 (H) 09/16/2021  ? ? ?REVIEW OF SYSTEMS: ? ? ? COVID 19 VACCINATION STATUS: Status post Pfizer x2 with booster August 2021 ? ? ?HISTORY OF CURRENT ILLNESS: ?From the original intake note: ? ?"Brittany" [name] has a history of left breast cancer dating back to 42.  She was followed by Dr. Beryle Beams and underwent lumpectomy, adjuvant chemotherapy, radiation and "hormonal therapy".  Per Brittany's recollection the tumor was less than 2 cm, 2 out of 10 lymph nodes were involved, and it was a lobular breast cancer.  She took tamoxifen for many years and then raloxifene. ? ?Dr. Beryle Beams also evaluated her for leukopenia which was noted at [facility] in 2011, worked up with a negative ANA and negative rheumatoid factors.  He felt it was a benign residual from her earlier chemotherapy.  She was released from follow-up in 2015. ? ?She has continued on intensified screening because of her family history.  On November 23, 2017 she had bilateral breast MRIs which were unremarkable.  Mammography November 20, 2018 showed no findings suspicious of malignancy.  Breast MRI 10/30/2019 showed a new irregular 0.4 cm enhancing focus in the upper inner quadrant of the right breast.  The left breast continued to be unremarkable except for postoperative changes and there were no abnormal appearing  lymph nodes. ? ?However in that same MRI multiple areas of enhancement were noted in the sternum.  This had not been seen in the prior MRI. ? ?Plan right MRI biopsy 11/16/2019 was canceled as the previously demonstrated 0.4 cm focus in the right breast showed only a tortuous vein at that location. ? ?However bone scan 11/19/2019 obtained to follow-up  on the sternal findings showed additional areas of concern at T12, T7-T11, L4 and ribs, as well as the sternum. ? ?The patient's subsequent history is as detailed below. ? ? ?PAST MEDICAL HISTORY: ?Past Medical History:  ?Diagnosis Date  ? ANEMIA-NOS   ? Asymptomatic varicose veins   ? Bone metastases 2021  ? Breast cancer ([place]) 1996  ? LEFT BREAST CA  ? BREAST CANCER, HX OF 01/1995 dx  ? s/p Lumpectomy, XRT, chemo and 47yrarmidex  ? Complication of anesthesia   ? Gestational diabetes mellitus in childbirth, diet controlled 1985  ? Hypothyroid 09/28/2015  ? Dx 09/2015  ? Leukopenia   ? mild since chemo  ? OSTEOARTHRITIS   ? Personal history of chemotherapy   ? Personal history of radiation therapy   ? SCOLIOSIS, MILD   ? ? ?PAST SURGICAL HISTORY: ?Past Surgical History:  ?Procedure Laterality Date  ? BREAST BIOPSY    ? BREAST LUMPECTOMY Left   ? 1996  ? HIP CLOSED REDUCTION N/A 02/12/2022  ? Procedure: CLOSED MANIPULATION HIP;  Surgeon: CTania Ade MD;  Location: [facility];  Service: Orthopedics;  Laterality: N/A;  ? left lumpectomy  1996  ? TONSILLECTOMY    ? TOTAL HIP ARTHROPLASTY    ? 2002 & 2003-[place]. vail @ [facility] ? ? ?FAMILY HISTORY ?Family History  ?Problem Relation Age of Onset  ? Heart disease Father 561 ?     AMI  ? Hyperlipidemia Father   ? Valvular heart disease Father 748 ? Parkinson's disease Father 774 ? Arthritis Mother   ? Breast cancer Mother 552 ? Hyperlipidemia Mother   ? Osteoporosis Mother   ? Pancreatic cancer Maternal Grandmother 73  ?     presumed dx  ? Diabetes Paternal Grandfather   ?     type 2  ? Breast cancer Maternal Aunt   ?The patient's father died at the age of 867from heart disease.  He had Lewy body dementia.  The patient's mother is 86years old as of January 2021.  She is in a memory unit.  She has a history of breast cancer diagnosed in her 566s(ductal carcinoma in situ).  The patient's mother sister had breast cancer diagnosed around age 68  The patient's mother's mother also  had cancer, "internal" (possibly ovarian or gastrointestinal).  There is no cancer on the father's side of the family. ? ? ?GYNECOLOGIC HISTORY:  ?No LMP recorded. Patient is postmenopausal. ?Menarche: 66years old ?Age at first live birth: 66years old ?GX P 2 ?LMP with chemotherapy, in 1996 ?Contraceptive a little over a year, with no complications ?HRT no  ?Hysterectomy?  No ?Salpingo-oophorectomy?  No ? ? ?SOCIAL HISTORY:  ?[place] from [employer] a degree in psychology and IT.  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  They both finally retired 03/28/2020.  Their daughter Anderson Malta is a Pharmacist, hospital in [place].  Their son Jenny Reichmann works for the same [employer] that the patient worked for.  Note that Jenny Reichmann did the [employer].  The patient has 1 grandchild (in [place]).  The patient attends [employer] ?  ? ADVANCED DIRECTIVES: The patient's husband is her healthcare power of attorney ? ? ?HEALTH MAINTENANCE: ?Social History  ? ?Tobacco Use  ? Smoking status: Former  ?  Types: Cigarettes  ?  Quit date: 06/25/1977  ?  Years since quitting: 44.6  ? Smokeless tobacco: Never  ?Vaping Use  ? Vaping Use: Never used  ?Substance Use Topics  ? Alcohol use: Yes  ?  Alcohol/week: 3.0 - 4.0 standard drinks  ?  Types: 3 - 4 Glasses of wine per week  ? Drug use: No  ? ? ? Colonoscopy: 2015 ? PAP: Up-to-date ? Bone density: 11/20/2018, normal ?  ?No Known Allergies ? ?Current Outpatient Medications  ?Medication Sig Dispense Refill  ? acetaminophen (TYLENOL) 500 MG tablet Take 1 tablet (500 mg total) by mouth in the morning, at noon, and at bedtime. Take with aleve 220 mg 90 tablet 0  ? calcium carbonate (TUMS - DOSED IN MG  ELEMENTAL CALCIUM) 500 MG chewable tablet Chew 1 tablet by mouth daily.    ? Calcium Citrate 250 MG TABS Take 4 tablets (1,000 mg total) by mouth daily.  0  ? capecitabine (XELODA) 500 MG tablet Take 2 tablet

## 2022-02-15 ENCOUNTER — Encounter: Payer: Self-pay | Admitting: Hematology and Oncology

## 2022-02-15 ENCOUNTER — Inpatient Hospital Stay (HOSPITAL_BASED_OUTPATIENT_CLINIC_OR_DEPARTMENT_OTHER): Payer: Medicare Other | Admitting: Hematology and Oncology

## 2022-02-15 ENCOUNTER — Inpatient Hospital Stay: Payer: Medicare Other

## 2022-02-15 ENCOUNTER — Other Ambulatory Visit: Payer: Self-pay

## 2022-02-15 VITALS — BP 120/78 | HR 64 | Temp 97.5°F | Resp 18 | Ht 67.0 in | Wt 151.0 lb

## 2022-02-15 DIAGNOSIS — C50112 Malignant neoplasm of central portion of left female breast: Secondary | ICD-10-CM | POA: Diagnosis not present

## 2022-02-15 DIAGNOSIS — Z170 Estrogen receptor positive status [ER+]: Secondary | ICD-10-CM

## 2022-02-15 DIAGNOSIS — C7951 Secondary malignant neoplasm of bone: Secondary | ICD-10-CM | POA: Diagnosis not present

## 2022-02-15 LAB — CBC WITH DIFFERENTIAL/PLATELET
Abs Immature Granulocytes: 0.12 10*3/uL — ABNORMAL HIGH (ref 0.00–0.07)
Basophils Absolute: 0 10*3/uL (ref 0.0–0.1)
Basophils Relative: 1 %
Eosinophils Absolute: 0.1 10*3/uL (ref 0.0–0.5)
Eosinophils Relative: 2 %
HCT: 25.2 % — ABNORMAL LOW (ref 36.0–46.0)
Hemoglobin: 8.4 g/dL — ABNORMAL LOW (ref 12.0–15.0)
Immature Granulocytes: 2 %
Lymphocytes Relative: 40 %
Lymphs Abs: 2.2 10*3/uL (ref 0.7–4.0)
MCH: 38.9 pg — ABNORMAL HIGH (ref 26.0–34.0)
MCHC: 33.3 g/dL (ref 30.0–36.0)
MCV: 116.7 fL — ABNORMAL HIGH (ref 80.0–100.0)
Monocytes Absolute: 0.5 10*3/uL (ref 0.1–1.0)
Monocytes Relative: 9 %
Neutro Abs: 2.5 10*3/uL (ref 1.7–7.7)
Neutrophils Relative %: 46 %
Platelets: 82 10*3/uL — ABNORMAL LOW (ref 150–400)
RBC: 2.16 MIL/uL — ABNORMAL LOW (ref 3.87–5.11)
RDW: 21.2 % — ABNORMAL HIGH (ref 11.5–15.5)
WBC: 5.5 10*3/uL (ref 4.0–10.5)
nRBC: 6.4 % — ABNORMAL HIGH (ref 0.0–0.2)

## 2022-02-15 LAB — COMPREHENSIVE METABOLIC PANEL
ALT: 78 U/L — ABNORMAL HIGH (ref 0–44)
AST: 106 U/L — ABNORMAL HIGH (ref 15–41)
Albumin: 3.4 g/dL — ABNORMAL LOW (ref 3.5–5.0)
Alkaline Phosphatase: 110 U/L (ref 38–126)
Anion gap: 4 — ABNORMAL LOW (ref 5–15)
BUN: 13 mg/dL (ref 8–23)
CO2: 29 mmol/L (ref 22–32)
Calcium: 8.5 mg/dL — ABNORMAL LOW (ref 8.9–10.3)
Chloride: 102 mmol/L (ref 98–111)
Creatinine, Ser: 0.8 mg/dL (ref 0.44–1.00)
GFR, Estimated: >60 mL/min (ref >60)
Glucose, Bld: 108 mg/dL — ABNORMAL HIGH (ref 70–99)
Potassium: 4.2 mmol/L (ref 3.5–5.1)
Sodium: 135 mmol/L (ref 135–145)
Total Bilirubin: 0.8 mg/dL (ref 0.3–1.2)
Total Protein: 6.4 g/dL — ABNORMAL LOW (ref 6.5–8.1)

## 2022-02-15 LAB — CEA (IN HOUSE-CHCC): CEA (CHCC-In House): 184.33 ng/mL — ABNORMAL HIGH (ref 0.00–5.00)

## 2022-02-16 LAB — CANCER ANTIGEN 27.29: CA 27.29: 133.2 U/mL — ABNORMAL HIGH (ref 0.0–38.6)

## 2022-02-18 ENCOUNTER — Other Ambulatory Visit (HOSPITAL_COMMUNITY): Payer: Self-pay

## 2022-02-18 ENCOUNTER — Inpatient Hospital Stay: Payer: Medicare Other

## 2022-02-18 DIAGNOSIS — C50112 Malignant neoplasm of central portion of left female breast: Secondary | ICD-10-CM

## 2022-02-18 NOTE — Progress Notes (Signed)
Pt came in for [facility] today with calcium at 8.5. Per Hinda Lenis RN, and Dr. Chryl Heck, "due to her getting ready to travel to [place]- and she is taking max calcium - Dr Chryl Heck wants to hold so it doesn't drop while on her trip - she should be scheduled for next injection in 28 days." Pt made aware and declined AVS.  ?

## 2022-02-23 ENCOUNTER — Other Ambulatory Visit (HOSPITAL_COMMUNITY): Payer: Self-pay

## 2022-03-02 ENCOUNTER — Ambulatory Visit
Admission: RE | Admit: 2022-03-02 | Discharge: 2022-03-02 | Disposition: A | Discharge status: Home / Self Care | Payer: Medicare Other | Source: Ambulatory Visit | Attending: Hematology and Oncology | Admitting: Hematology and Oncology

## 2022-03-02 DIAGNOSIS — C50112 Malignant neoplasm of central portion of left female breast: Secondary | ICD-10-CM

## 2022-03-02 MED ORDER — GADOPICLENOL 0.5 MMOL/ML IV SOLN
7.5000 mL | Freq: Once | INTRAVENOUS | Status: AC | PRN
  Administered 2022-03-02: 7.5 mL via INTRAVENOUS

## 2022-03-04 ENCOUNTER — Encounter: Payer: Self-pay | Admitting: Hematology and Oncology

## 2022-03-05 ENCOUNTER — Other Ambulatory Visit: Payer: Self-pay

## 2022-03-05 ENCOUNTER — Encounter: Payer: Self-pay | Admitting: Hematology and Oncology

## 2022-03-05 ENCOUNTER — Inpatient Hospital Stay: Payer: Medicare Other | Attending: Oncology | Admitting: Hematology and Oncology

## 2022-03-05 VITALS — BP 121/74 | HR 83 | Temp 97.9°F | Resp 16 | Wt 160.4 lb

## 2022-03-05 DIAGNOSIS — Z171 Estrogen receptor negative status [ER-]: Secondary | ICD-10-CM | POA: Diagnosis not present

## 2022-03-05 DIAGNOSIS — Z170 Estrogen receptor positive status [ER+]: Secondary | ICD-10-CM | POA: Diagnosis not present

## 2022-03-05 DIAGNOSIS — Z79899 Other long term (current) drug therapy: Secondary | ICD-10-CM | POA: Insufficient documentation

## 2022-03-05 DIAGNOSIS — R188 Other ascites: Secondary | ICD-10-CM | POA: Diagnosis not present

## 2022-03-05 DIAGNOSIS — Z923 Personal history of irradiation: Secondary | ICD-10-CM | POA: Diagnosis not present

## 2022-03-05 DIAGNOSIS — C50112 Malignant neoplasm of central portion of left female breast: Secondary | ICD-10-CM

## 2022-03-05 DIAGNOSIS — K7460 Unspecified cirrhosis of liver: Secondary | ICD-10-CM | POA: Insufficient documentation

## 2022-03-05 DIAGNOSIS — C50919 Malignant neoplasm of unspecified site of unspecified female breast: Secondary | ICD-10-CM | POA: Diagnosis present

## 2022-03-05 DIAGNOSIS — Z9221 Personal history of antineoplastic chemotherapy: Secondary | ICD-10-CM | POA: Insufficient documentation

## 2022-03-05 DIAGNOSIS — C7951 Secondary malignant neoplasm of bone: Secondary | ICD-10-CM | POA: Diagnosis present

## 2022-03-05 DIAGNOSIS — K769 Liver disease, unspecified: Secondary | ICD-10-CM | POA: Diagnosis not present

## 2022-03-05 MED ORDER — FUROSEMIDE 20 MG PO TABS: 20.0000 mg | ORAL_TABLET | Freq: Every day | ORAL | 3 refills | Status: DC

## 2022-03-05 NOTE — Progress Notes (Signed)
?[place]  ?[phone] [phone]  ? ? ? ?ID: [name] DOB: [birth year]  MR#: [id number]  ALP#:[id number] ? ?Patient Care Team: ?Binnie Rail, MD as PCP - General (Internal Medicine) ?Aloha Gell, MD as Consulting Physician (Obstetrics and Gynecology) ?Sydnee Levans, MD (Dermatology) ?Magrinat, Virgie Dad, MD (Inactive) as Consulting Physician (Oncology) ?Richmond Campbell, MD as Consulting Physician (Gastroenterology) ?Stefanie Libel, MD as Consulting Physician ([place]) ?Hilts, Legrand Como, MD (Family Medicine) ?Erline Levine, MD as Consulting Physician (Neurosurgery) ?Dallie Piles, MD as Referring Physician (Orthopedic Surgery) ?Debbra Riding, MD as Consulting Physician (Ophthalmology) ?Raina Mina, RPH-CPP (Pharmacist) ?Benay Pike, MD ?OTHER MD: ? ?CHIEF COMPLAINT: Stage IV lobular breast cancer ? ?CURRENT TREATMENT: None ? ? ?INTERVAL HISTORY: ? ?[name] returns today for follow up of her stage IV lobular breast cancer.  ? ?She has been on and off capecitabine since 02/25/67 at variably low doses due to palmar plantar erythrodysesthesia.   ?Since last visit, she had left hip dislocation and had total hip arthroplasty ?Since last visit, she has noticed leg swelling, abdominal swelling and indigestion, feeling also constipated. ?She is not on xeloda any more because of intolerable HFS ?She also denies much appetite, eating bland foods mostly. ?She will be traveling to [place], May 16-June 2nd. ?Rest of the pertinent 10 point ROS reviewed and negative. ? ?Lab Results  ?Component Value Date  ? CA2729 133.2 (H) 02/15/2022  ? [id number] 115.8 (H) 01/21/2022  ? CA2729 81.3 (H) 12/24/2021  ? CA2729 58.2 (H) 11/26/2021  ? [id number] 36.3 10/28/2021  ? ?Lab Results  ?Component Value Date  ? CEA1 184.33 (H) 02/15/2022  ? CEA1 148.02 (H) 01/21/2022  ? CEA1 30.37 (H) 12/24/2021  ? CEA1 10.55 (H) 11/26/2021  ? CEA1 8.05 (H) 10/28/2021  ? ? ?REVIEW OF  SYSTEMS: ? ? ? COVID 19 VACCINATION STATUS: Status post Pfizer x2 with booster August 2021 ? ? ?HISTORY OF CURRENT ILLNESS: ?From the original intake note: ? ?"[name]" State has a history of left breast cancer dating back to 66.  She was followed by Dr. Beryle Beams and underwent lumpectomy, adjuvant chemotherapy, radiation and "hormonal therapy".  Per [name]'s recollection the tumor was less than 2 cm, 68 out of 10 lymph nodes were involved, and it was a lobular breast cancer.  She took tamoxifen for many years and then raloxifene. ? ?Dr. Beryle Beams also evaluated her for leukopenia which was noted at first in 2011, worked up with a negative ANA and negative rheumatoid factors.  He felt it was a benign residual from her earlier chemotherapy.  She was released from follow-up in 2015. ? ?She has continued on intensified screening because of her family history.  On November 23, 2017 she had bilateral breast MRIs which were unremarkable.  Mammography November 20, 2018 showed no findings suspicious of malignancy.  Breast MRI 10/30/2019 showed a new irregular 0.4 cm enhancing focus in the upper inner quadrant of the right breast.  The left breast continued to be unremarkable except for postoperative changes and there were no abnormal appearing lymph nodes. ? ?However in that same MRI multiple areas of enhancement were noted in the sternum.  This had not been seen in the prior MRI. ? ?Plan right MRI biopsy 11/16/2019 was canceled as the previously demonstrated 0.4 cm focus in the right breast showed only a tortuous vein at that location. ? ?However bone scan 11/19/2019 obtained to follow-up on the sternal findings showed additional areas of concern at T12,  T7-T11, L4 and ribs, as well as the sternum. ? ?The patient's subsequent history is as detailed below. ? ? ?PAST MEDICAL HISTORY: ?Past Medical History:  ?Diagnosis Date  ? ANEMIA-NOS   ? Asymptomatic varicose veins   ? Bone metastases 2021  ? Breast cancer ([place]) 1996  ? LEFT  BREAST CA  ? BREAST CANCER, HX OF 01/1995 dx  ? s/p Lumpectomy, XRT, chemo and 66yrarmidex  ? Complication of anesthesia   ? Gestational diabetes mellitus in childbirth, diet controlled 1985  ? Hypothyroid 09/28/2015  ? Dx 09/2015  ? Leukopenia   ? mild since chemo  ? OSTEOARTHRITIS   ? Personal history of chemotherapy   ? Personal history of radiation therapy   ? SCOLIOSIS, MILD   ? ? ?PAST SURGICAL HISTORY: ?Past Surgical History:  ?Procedure Laterality Date  ? BREAST BIOPSY    ? BREAST LUMPECTOMY Left   ? 1996  ? HIP CLOSED REDUCTION N/A 02/12/2022  ? Procedure: CLOSED MANIPULATION HIP;  Surgeon: CTania Ade MD;  Location: [facility];  Service: Orthopedics;  Laterality: N/A;  ? left lumpectomy  1996  ? TONSILLECTOMY    ? TOTAL HIP ARTHROPLASTY    ? 2002 & 2003-Dr. vail @ [facility] ? ? ?FAMILY HISTORY ?Family History  ?Problem Relation Age of Onset  ? Heart disease Father 550 ?     AMI  ? Hyperlipidemia Father   ? Valvular heart disease Father 762 ? Parkinson's disease Father 752 ? Arthritis Mother   ? Breast cancer Mother 526 ? Hyperlipidemia Mother   ? Osteoporosis Mother   ? Pancreatic cancer Maternal Grandmother 73  ?     presumed dx  ? Diabetes Paternal Grandfather   ?     type 2  ? Breast cancer Maternal Aunt   ?The patient's father died at the age of 827from heart disease.  He had Lewy body dementia.  The patient's mother is 884years old as of January 2021.  She is in a memory unit.  She has a history of breast cancer diagnosed in her 526s(ductal carcinoma in situ).  The patient's mother sister had breast cancer diagnosed around age 68  The patient's mother's mother also had cancer, "internal" (possibly ovarian or gastrointestinal).  There is no cancer on the father's side of the family. ? ? ?GYNECOLOGIC HISTORY:  ?No LMP recorded. Patient is postmenopausal. ?Menarche: 66years old ?Age at first live birth: 66years old ?GX P 2 ?LMP with chemotherapy, in 1996 ?Contraceptive a little over a year, with no  complications ?HRT no  ?Hysterectomy?  No ?Salpingo-oophorectomy?  No ? ? ?SOCIAL HISTORY:  ?[place] from [place] [place] a degree in psychology and IT.  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  They both finally retired 03/28/2020.  Their daughter JAnderson Maltais a hPharmacist, hospitalin [place]  Their son JJenny Reichmannworks for the same wRyland Groupthat the patient worked for.  Note that JJenny Reichmanndid the PUnisys [employer]  The patient has 1 grandchild ([place].  The patient attends [place]?  ? ADVANCED DIRECTIVES: The patient's husband is her healthcare power of attorney ? ? ?  HEALTH MAINTENANCE: ?Social History  ? ?Tobacco Use  ? Smoking status: Former  ?  Types: Cigarettes  ?  Quit date: 06/25/1977  ?  Years since quitting: 44.7  ? Smokeless tobacco: Never  ?Vaping Use  ? Vaping Use: Never used  ?Substance Use Topics  ? Alcohol use: Yes  ?  Alcohol/week: 3.0 - 4.0 standard drinks  ?  Types: 3 - 4 Glasses of wine per week  ? Drug use: No  ? ? ? Colonoscopy: 2015 ? PAP: Up-to-date ? Bone density: 11/20/2018, normal ?  ?No Known Allergies ? ?Current Outpatient Medications  ?Medication Sig Dispense Refill  ? acetaminophen (TYLENOL) 500 MG tablet Take 1 tablet (500 mg total) by mouth in the morning, at noon, and at bedtime. Take with aleve 220 mg 90 tablet 0  ? calcium carbonate (TUMS - DOSED IN MG ELEMENTAL CALCIUM) 500 MG chewable tablet Chew 1 tablet by mouth daily.    ? Calcium Citrate 250 MG TABS Take 4 tablets (1,000 mg total) by mouth daily.  0  ? capecitabine (XELODA) 500 MG tablet Take 2 tablets (1000 mg) by mouth twice daily 120 tablet 0  ? Cholecalciferol (VITAMIN D3) 1000 UNITS CAPS Take by mouth daily.    ? Denosumab  (XGEVA [place]) Inject into the skin.    ? diclofenac sodium (VOLTAREN) 1 % GEL Apply 2 g topically 3 (three) times daily as needed. 100 g 1  ? gabapentin (NEURONTIN) 300 MG capsule Take 1 capsule (300 mg total) by mouth

## 2022-03-23 ENCOUNTER — Encounter: Payer: Self-pay | Admitting: Internal Medicine

## 2022-03-23 DIAGNOSIS — R569 Unspecified convulsions: Secondary | ICD-10-CM | POA: Insufficient documentation

## 2022-03-23 NOTE — Progress Notes (Unsigned)
Subjective:    Patient ID: Brittany [name], female    DOB: 1956/08/29, 66 y.o.   MRN: [id number]     HPI Brittany [name] is here for follow up from her hospitalization in [place].    She fell in a bathroom last Friday in [place] while they were on a trip with [employer].  When he found her she was unconscious,  emergency services called, took her to [facility] in [place], [place].  On the way to the hospital she had a seizure.  That was Friday night, and she has been recovering there since then.  They are giving her anti seizure medication and have recommended an MRI of the brain when she gets back.  They did do X-rays and no hemorrhage was detected.  She will need a referral to a doctor for this when she gets back.    Still has headaches.  Ct scan - no hemorrhage.  Two areas - ? Cancer - needs MRI.     Indigestion, abdominal bloating.  Has not eaten anything since Friday.  Abd swollen.  Legs swollen.  .    Medications and allergies reviewed with patient and updated if appropriate.  Current Outpatient Medications on File Prior to Visit  Medication Sig Dispense Refill   acetaminophen (TYLENOL) 500 MG tablet Take 1 tablet (500 mg total) by mouth in the morning, at noon, and at bedtime. Take with aleve 220 mg 90 tablet 0   calcium carbonate (TUMS - DOSED IN MG ELEMENTAL CALCIUM) 500 MG chewable tablet Chew 1 tablet by mouth daily.     Calcium Citrate 250 MG TABS Take 4 tablets (1,000 mg total) by mouth daily.  0   capecitabine (XELODA) 500 MG tablet Take 2 tablets (1000 mg) by mouth twice daily 120 tablet 0   Cholecalciferol (VITAMIN D3) 1000 UNITS CAPS Take by mouth daily.     Denosumab (XGEVA [place]) Inject into the skin.     diclofenac sodium (VOLTAREN) 1 % GEL Apply 2 g topically 3 (three) times daily as needed. 100 g 1   furosemide (LASIX) 20 MG tablet Take 1 tablet (20 mg total) by mouth daily. 30 tablet 3   gabapentin (NEURONTIN) 300 MG capsule Take 1 capsule (300 mg  total) by mouth 3 (three) times daily. 270 capsule 1   levothyroxine (SYNTHROID) 50 MCG tablet TAKE ONE TABLET BY MOUTH DAILY 90 tablet 3   Multiple Vitamin (MULTIVITAMIN) tablet Take 1 tablet by mouth daily.     naproxen sodium (ALEVE) 220 MG tablet Take 1 tablet (220 mg total) by mouth in the morning, at noon, and at bedtime. Take with tylenol 500 mg 90 tablet 4   Omega-3 Fatty Acids (FISH OIL) 1000 MG CPDR Take by mouth daily.     traMADol (ULTRAM) 50 MG tablet Take 1 tablet (50 mg total) by mouth every 6 (six) hours as needed. 20 tablet 0   UNABLE TO FIND Med Name : sildenafil 2% cream apply to hands and feet bid prn for CIPN 30 g 3   No current facility-administered medications on file prior to visit.     Review of Systems     Objective:  There were no vitals filed for this visit. BP Readings from Last 3 Encounters:  03/05/22 121/74  02/18/22 130/80  02/15/22 120/78   Wt Readings from Last 3 Encounters:  03/05/22 160 lb 6.4 oz (72.8 kg)  02/15/22 151 lb (68.5 kg)  02/12/22 143 lb (64.9  kg)   There is no height or weight on file to calculate BMI.    Physical Exam     Lab Results  Component Value Date   WBC 5.5 02/15/2022   HGB 8.4 (L) 02/15/2022   HCT 25.2 (L) 02/15/2022   PLT 82 (L) 02/15/2022   GLUCOSE 108 (H) 02/15/2022   CHOL 170 09/28/2019   TRIG 44.0 09/28/2019   HDL 81.10 09/28/2019   LDLCALC 80 09/28/2019   ALT 78 (H) 02/15/2022   AST 106 (H) 02/15/2022   NA 135 02/15/2022   K 4.2 02/15/2022   CL 102 02/15/2022   CREATININE 0.80 02/15/2022   BUN 13 02/15/2022   CO2 29 02/15/2022   TSH 1.754 10/28/2021   HGBA1C 5.4 09/28/2019     Assessment & Plan:    See Problem List for Assessment and Plan of chronic medical problems.

## 2022-03-23 NOTE — Patient Instructions (Addendum)
Evaluation in ED

## 2022-03-24 ENCOUNTER — Emergency Department (HOSPITAL_COMMUNITY): Payer: Medicare Other

## 2022-03-24 ENCOUNTER — Inpatient Hospital Stay (HOSPITAL_COMMUNITY)
Admission: EM | Admit: 2022-03-24 | Discharge: 2022-03-26 | DRG: 081 | Disposition: A | Discharge status: Home / Self Care | Payer: Medicare Other | Attending: Internal Medicine | Admitting: Internal Medicine

## 2022-03-24 ENCOUNTER — Encounter (HOSPITAL_COMMUNITY): Payer: Self-pay | Admitting: Emergency Medicine

## 2022-03-24 ENCOUNTER — Other Ambulatory Visit: Payer: Self-pay

## 2022-03-24 ENCOUNTER — Ambulatory Visit (INDEPENDENT_AMBULATORY_CARE_PROVIDER_SITE_OTHER): Payer: Medicare Other | Admitting: Internal Medicine

## 2022-03-24 VITALS — BP 120/78 | HR 85 | Temp 98.2°F | Ht 67.0 in

## 2022-03-24 DIAGNOSIS — C799 Secondary malignant neoplasm of unspecified site: Principal | ICD-10-CM

## 2022-03-24 DIAGNOSIS — R569 Unspecified convulsions: Secondary | ICD-10-CM

## 2022-03-24 DIAGNOSIS — K30 Functional dyspepsia: Secondary | ICD-10-CM

## 2022-03-24 DIAGNOSIS — Z833 Family history of diabetes mellitus: Secondary | ICD-10-CM

## 2022-03-24 DIAGNOSIS — D649 Anemia, unspecified: Secondary | ICD-10-CM | POA: Diagnosis not present

## 2022-03-24 DIAGNOSIS — C7931 Secondary malignant neoplasm of brain: Secondary | ICD-10-CM

## 2022-03-24 DIAGNOSIS — Z96643 Presence of artificial hip joint, bilateral: Secondary | ICD-10-CM | POA: Diagnosis present

## 2022-03-24 DIAGNOSIS — Z79899 Other long term (current) drug therapy: Secondary | ICD-10-CM

## 2022-03-24 DIAGNOSIS — C786 Secondary malignant neoplasm of retroperitoneum and peritoneum: Secondary | ICD-10-CM | POA: Diagnosis present

## 2022-03-24 DIAGNOSIS — Z79890 Hormone replacement therapy (postmenopausal): Secondary | ICD-10-CM

## 2022-03-24 DIAGNOSIS — R188 Other ascites: Secondary | ICD-10-CM

## 2022-03-24 DIAGNOSIS — Z8349 Family history of other endocrine, nutritional and metabolic diseases: Secondary | ICD-10-CM

## 2022-03-24 DIAGNOSIS — D689 Coagulation defect, unspecified: Secondary | ICD-10-CM | POA: Diagnosis present

## 2022-03-24 DIAGNOSIS — C787 Secondary malignant neoplasm of liver and intrahepatic bile duct: Secondary | ICD-10-CM | POA: Diagnosis present

## 2022-03-24 DIAGNOSIS — M1990 Unspecified osteoarthritis, unspecified site: Secondary | ICD-10-CM | POA: Diagnosis present

## 2022-03-24 DIAGNOSIS — Z923 Personal history of irradiation: Secondary | ICD-10-CM

## 2022-03-24 DIAGNOSIS — Z9221 Personal history of antineoplastic chemotherapy: Secondary | ICD-10-CM | POA: Diagnosis not present

## 2022-03-24 DIAGNOSIS — K219 Gastro-esophageal reflux disease without esophagitis: Secondary | ICD-10-CM

## 2022-03-24 DIAGNOSIS — D638 Anemia in other chronic diseases classified elsewhere: Secondary | ICD-10-CM | POA: Diagnosis present

## 2022-03-24 DIAGNOSIS — R140 Abdominal distension (gaseous): Secondary | ICD-10-CM

## 2022-03-24 DIAGNOSIS — Z803 Family history of malignant neoplasm of breast: Secondary | ICD-10-CM | POA: Diagnosis not present

## 2022-03-24 DIAGNOSIS — K760 Fatty (change of) liver, not elsewhere classified: Secondary | ICD-10-CM | POA: Diagnosis present

## 2022-03-24 DIAGNOSIS — D539 Nutritional anemia, unspecified: Secondary | ICD-10-CM | POA: Diagnosis present

## 2022-03-24 DIAGNOSIS — K7460 Unspecified cirrhosis of liver: Secondary | ICD-10-CM | POA: Diagnosis present

## 2022-03-24 DIAGNOSIS — Z8261 Family history of arthritis: Secondary | ICD-10-CM | POA: Diagnosis not present

## 2022-03-24 DIAGNOSIS — Z853 Personal history of malignant neoplasm of breast: Secondary | ICD-10-CM | POA: Diagnosis not present

## 2022-03-24 DIAGNOSIS — G936 Cerebral edema: Secondary | ICD-10-CM | POA: Diagnosis present

## 2022-03-24 DIAGNOSIS — Z87891 Personal history of nicotine dependence: Secondary | ICD-10-CM

## 2022-03-24 DIAGNOSIS — C7951 Secondary malignant neoplasm of bone: Secondary | ICD-10-CM | POA: Diagnosis present

## 2022-03-24 DIAGNOSIS — Z8249 Family history of ischemic heart disease and other diseases of the circulatory system: Secondary | ICD-10-CM

## 2022-03-24 DIAGNOSIS — Z170 Estrogen receptor positive status [ER+]: Secondary | ICD-10-CM

## 2022-03-24 DIAGNOSIS — E039 Hypothyroidism, unspecified: Secondary | ICD-10-CM

## 2022-03-24 DIAGNOSIS — Z87898 Personal history of other specified conditions: Secondary | ICD-10-CM | POA: Diagnosis not present

## 2022-03-24 DIAGNOSIS — D696 Thrombocytopenia, unspecified: Secondary | ICD-10-CM | POA: Diagnosis present

## 2022-03-24 LAB — URINALYSIS, ROUTINE W REFLEX MICROSCOPIC
Bacteria, UA: NONE SEEN
Bilirubin Urine: NEGATIVE
Glucose, UA: NEGATIVE mg/dL
Hgb urine dipstick: NEGATIVE
Ketones, ur: NEGATIVE mg/dL
Nitrite: NEGATIVE
Protein, ur: NEGATIVE mg/dL
Specific Gravity, Urine: 1.012 (ref 1.005–1.030)
pH: 6 (ref 5.0–8.0)

## 2022-03-24 LAB — CBC WITH DIFFERENTIAL/PLATELET
Abs Immature Granulocytes: 0.5 10*3/uL — ABNORMAL HIGH (ref 0.00–0.07)
Basophils Absolute: 0 10*3/uL (ref 0.0–0.1)
Basophils Relative: 0 %
Eosinophils Absolute: 0 10*3/uL (ref 0.0–0.5)
Eosinophils Relative: 0 %
HCT: 28.9 % — ABNORMAL LOW (ref 36.0–46.0)
Hemoglobin: 9.3 g/dL — ABNORMAL LOW (ref 12.0–15.0)
Immature Granulocytes: 7 %
Lymphocytes Relative: 21 %
Lymphs Abs: 1.4 10*3/uL (ref 0.7–4.0)
MCH: 38.4 pg — ABNORMAL HIGH (ref 26.0–34.0)
MCHC: 32.2 g/dL (ref 30.0–36.0)
MCV: 119.4 fL — ABNORMAL HIGH (ref 80.0–100.0)
Monocytes Absolute: 0.6 10*3/uL (ref 0.1–1.0)
Monocytes Relative: 9 %
Neutro Abs: 4.2 10*3/uL (ref 1.7–7.7)
Neutrophils Relative %: 63 %
Platelets: 136 10*3/uL — ABNORMAL LOW (ref 150–400)
RBC: 2.42 MIL/uL — ABNORMAL LOW (ref 3.87–5.11)
RDW: 18 % — ABNORMAL HIGH (ref 11.5–15.5)
WBC: 6.8 10*3/uL (ref 4.0–10.5)
nRBC: 36.4 % — ABNORMAL HIGH (ref 0.0–0.2)

## 2022-03-24 LAB — COMPREHENSIVE METABOLIC PANEL
ALT: 79 U/L — ABNORMAL HIGH (ref 0–44)
AST: 179 U/L — ABNORMAL HIGH (ref 15–41)
Albumin: 2.8 g/dL — ABNORMAL LOW (ref 3.5–5.0)
Alkaline Phosphatase: 117 U/L (ref 38–126)
Anion gap: 8 (ref 5–15)
BUN: 25 mg/dL — ABNORMAL HIGH (ref 8–23)
CO2: 26 mmol/L (ref 22–32)
Calcium: 8.1 mg/dL — ABNORMAL LOW (ref 8.9–10.3)
Chloride: 98 mmol/L (ref 98–111)
Creatinine, Ser: 0.6 mg/dL (ref 0.44–1.00)
GFR, Estimated: >60 mL/min (ref >60)
Glucose, Bld: 114 mg/dL — ABNORMAL HIGH (ref 70–99)
Potassium: 3.8 mmol/L (ref 3.5–5.1)
Sodium: 132 mmol/L — ABNORMAL LOW (ref 135–145)
Total Bilirubin: 1.8 mg/dL — ABNORMAL HIGH (ref 0.3–1.2)
Total Protein: 5.8 g/dL — ABNORMAL LOW (ref 6.5–8.1)

## 2022-03-24 LAB — PROTIME-INR
INR: 1.5 — ABNORMAL HIGH (ref 0.8–1.2)
Prothrombin Time: 17.5 seconds — ABNORMAL HIGH (ref 11.4–15.2)

## 2022-03-24 MED ORDER — GADOBUTROL 1 MMOL/ML IV SOLN
7.0000 mL | Freq: Once | INTRAVENOUS | Status: AC | PRN
  Administered 2022-03-24: 7 mL via INTRAVENOUS

## 2022-03-24 MED ORDER — SODIUM CHLORIDE 0.9 % IV SOLN: INTRAVENOUS | Status: DC

## 2022-03-24 MED ORDER — FUROSEMIDE 20 MG PO TABS: 40.0000 mg | ORAL_TABLET | Freq: Every day | ORAL | 3 refills | Status: DC

## 2022-03-24 MED ORDER — SPIRONOLACTONE 100 MG PO TABS: 100.0000 mg | ORAL_TABLET | Freq: Every day | ORAL | Status: AC

## 2022-03-24 MED ORDER — PANTOPRAZOLE SODIUM 40 MG IV SOLR
40.0000 mg | Freq: Once | INTRAVENOUS | Status: AC
  Administered 2022-03-24: 40 mg via INTRAVENOUS
  Filled 2022-03-24: qty 10

## 2022-03-24 MED ORDER — LEVETIRACETAM 500 MG PO TABS: 500.0000 mg | ORAL_TABLET | Freq: Two times a day (BID) | ORAL | Status: AC

## 2022-03-24 MED ORDER — DEXAMETHASONE 2 MG PO TABS: 4.0000 mg | ORAL_TABLET | Freq: Three times a day (TID) | ORAL | Status: DC

## 2022-03-24 MED ORDER — DEXAMETHASONE SODIUM PHOSPHATE 10 MG/ML IJ SOLN
10.0000 mg | Freq: Once | INTRAMUSCULAR | Status: AC
  Administered 2022-03-24: 10 mg via INTRAVENOUS
  Filled 2022-03-24: qty 1

## 2022-03-24 NOTE — ED Provider Notes (Signed)
Angiocath insertion Performed by: Wandra Arthurs  Consent: Verbal consent obtained. Risks and benefits: risks, benefits and alternatives were discussed Time out: Immediately prior to procedure a "time out" was called to verify the correct patient, procedure, equipment, support staff and site/side marked as required.  Preparation: Patient was prepped and draped in the usual sterile fashion.  Vein Location: R brachial  Ultrasound Guided  Gauge: 20 long   Normal blood return and flush without difficulty Patient tolerance: Patient tolerated the procedure well with no immediate complications.     Drenda Freeze, MD 03/24/22 [phone]

## 2022-03-24 NOTE — ED Provider Notes (Signed)
[facility] DEPT Provider Note   CSN: [id number] Arrival date & time: 03/24/22  1544     History {Add pertinent medical, surgical, social history, OB history to HPI:1} Chief Complaint  Patient presents with   Abdominal Pain   Leg Swelling    Brittany [name] is a 66 y.o. female.  HPI Patient presents for general weakness with abdominal and leg swelling.  The symptoms have been progressive for 1 month.  More recently over the last several days, she has not been able to eat because of a sensation of heartburn after eating just a couple of bites and the discomfort in her stomach which is manifested by swelling.  She has not had any vomiting.  6 days ago she was traveling in [place], in the bathroom, and was found unconscious on the floor apparently after sitting on a commode.  She had an injury to the left side of her face.  She was transferred to the hospital, and during transfer she had a seizure.  She was evaluated for traumatic injuries and started on Keppra.  She was hospitalized for 3 days and discharged, 2 days ago.  She went to see her PCP today, who referred her to this facility for further evaluation.    Home Medications Prior to Admission medications   Medication Sig Start Date End Date Taking? Authorizing Provider  calcium carbonate (TUMS - DOSED IN MG ELEMENTAL CALCIUM) 500 MG chewable tablet Chew 1 tablet by mouth 4 (four) times daily as needed for indigestion or heartburn.   Yes [provider]  Cholecalciferol (VITAMIN D3) 1000 UNITS CAPS Take 1 capsule by mouth daily.   Yes [provider]  dexamethasone (DECADRON) 2 MG tablet Take 2 tablets (4 mg total) by mouth 3 (three) times daily. 03/24/22  Yes Burns, Claudina Lick, MD  furosemide (LASIX) 40 MG tablet Take 40 mg by mouth daily.   Yes [provider]  levETIRAcetam (KEPPRA) 500 MG tablet Take 1 tablet (500 mg total) by mouth 2 (two) times daily. 03/24/22  Yes Burns, Claudina Lick, MD  levothyroxine (SYNTHROID) 50 MCG tablet TAKE ONE TABLET BY MOUTH DAILY 12/07/21  Yes Burns, Claudina Lick, MD  spironolactone (ALDACTONE) 100 MG tablet Take 1 tablet (100 mg total) by mouth daily. 03/24/22  Yes Burns, Claudina Lick, MD  UNABLE TO FIND Med Name : sildenafil 2% cream apply to hands and feet bid prn for CIPN 01/25/22  Yes Iruku, Arletha Pili, MD  Calcium Citrate 250 MG TABS Take 4 tablets (1,000 mg total) by mouth daily. Patient not taking: Reported on 03/24/2022 09/14/13   Rowe Clack, MD  furosemide (LASIX) 20 MG tablet Take 2 tablets (40 mg total) by mouth daily. Patient not taking: Reported on 03/24/2022 03/24/22   Binnie Rail, MD      Allergies    Patient has no known allergies.    Review of Systems   Review of Systems  Physical Exam Updated Vital Signs BP 137/73   Pulse 94   Temp 98.2 F (36.8 C) (Oral)   Resp 19   SpO2 90%  Physical Exam  ED Results / Procedures / Treatments   Labs (all labs ordered are listed, but only abnormal results are displayed) Labs Reviewed  COMPREHENSIVE METABOLIC PANEL - Abnormal; Notable for the following components:      Result Value   Sodium 132 (*)    Glucose, Bld 114 (*)    BUN 25 (*)    Calcium  8.1 (*)    Total Protein 5.8 (*)    Albumin 2.8 (*)    AST 179 (*)    ALT 79 (*)    Total Bilirubin 1.8 (*)    All other components within normal limits  CBC WITH DIFFERENTIAL/PLATELET - Abnormal; Notable for the following components:   RBC 2.42 (*)    Hemoglobin 9.3 (*)    HCT 28.9 (*)    MCV 119.4 (*)    MCH 38.4 (*)    RDW 18.0 (*)    Platelets 136 (*)    nRBC 36.4 (*)    Abs Immature Granulocytes 0.50 (*)    All other components within normal limits  PROTIME-INR - Abnormal; Notable for the following components:   Prothrombin Time 17.5 (*)    INR 1.5 (*)    All other components within normal limits  URINALYSIS, ROUTINE W REFLEX MICROSCOPIC - Abnormal; Notable for the following components:   Leukocytes,Ua TRACE (*)     All other components within normal limits    EKG None  Radiology MR Brain W and Wo Contrast  Result Date: 03/24/2022 CLINICAL DATA:  Initial evaluation for metastatic disease. EXAM: MRI HEAD WITHOUT AND WITH CONTRAST TECHNIQUE: Multiplanar, multiecho pulse sequences of the brain and surrounding structures were obtained without and with intravenous contrast. CONTRAST:  62m GADAVIST GADOBUTROL 1 MMOL/ML IV SOLN COMPARISON:  None available. FINDINGS: Brain: Cerebral volume within normal limits. Irregular dural thickening and enhancement seen overlying the anterior left temporal lobe, consistent with metastatic disease. Associated leptomeningeal involvement with extension to involve the underlying brain parenchyma (series 11, image 51). Associated localized vasogenic edema at this location without significant regional mass effect. Additional probable dural involvement noted superiorly and anteriorly overlying the frontal lobes bilaterally (series 11, image 77). Patchy dural and leptomeningeal involvement noted along the adjacent anterior falx (series 11, image 76). Few additional small left open in G ule and/or parenchymal metastases noted elsewhere involving both cerebral hemispheres, largest of which seen at the posterior left frontal region and measures 7 mm (series 11, image 110). Few small metastatic deposits noted involving the right cerebellum (series 11, image 27) and right paramedian cerebellar vermis (series 11, image 49). Probable involvement of the right 7/8 nerve root complex at the right IAC (series 11, image 27). No other significant associated edema. No associated hemorrhage. No evidence for acute or subacute infarct. Gray-white matter differentiation otherwise maintained. No other areas of chronic cortical infarction or other insult. No acute or chronic intracranial blood products. Ventricles normal size without hydrocephalus. No extra-axial fluid collection. Pituitary gland suprasellar region  within normal limits. Midline structures intact and normally formed. Vascular: Major intracranial vascular flow voids are maintained. Asymmetric FLAIR signal intensity without T1 correlate involving the left transverse sinus noted, likely slow/sluggish flow. Skull and upper cervical spine: Craniocervical junction within normal limits. Bone marrow signal intensity is diffusely heterogeneous without visible focal marrow replacing lesion. Tiny 4 mm nodular FLAIR hyperintensity involving the left frontal scalp noted, indeterminate (series 11, image 34). No other focal scalp lesions. Sinuses/Orbits: Globes and orbital soft tissues demonstrate no acute finding. Paranasal sinuses are largely clear. No significant mastoid effusion. Other: None. IMPRESSION: 1. Multiple scattered dural, leptomeningeal, and parenchymal intracranial metastasis as detailed above. Associated vasogenic edema at the anterior left temporal lobe without significant regional mass effect. 2. 4 mm nodular FLAIR hyperintensity involving the left frontal scalp, indeterminate. Correlation with physical exam recommended. 3. No other acute intracranial abnormality. Electronically Signed   By:  Jeannine Boga M.D.   On: 03/24/2022 22:54    Procedures Procedures  {Document cardiac monitor, telemetry assessment procedure when appropriate:1}  Medications Ordered in ED Medications  0.9 %  sodium chloride infusion (has no administration in time range)  pantoprazole (PROTONIX) injection 40 mg (has no administration in time range)  dexamethasone (DECADRON) injection 10 mg (has no administration in time range)  gadobutrol (GADAVIST) 1 MMOL/ML injection 7 mL (7 mLs Intravenous Contrast Given 03/24/22 1654)    ED Course/ Medical Decision Making/ A&P                           Medical Decision Making Patient presenting for evaluation of general weakness, and malaise, and recent new onset of seizures.  She has known breast cancer which is metastatic,  and now has suspected brain metastases based on a CT scan done following the recent head injury.  She was evaluated and treated for that started on Keppra.  She has not had another seizure.  She is followed closely by oncology who is treating her with chemotherapy.  Problems Addressed: Indigestion: acute illness or injury Metastatic malignant neoplasm, unspecified site [facility]): chronic illness or injury with exacerbation, progression, or side effects of treatment that poses a threat to life or bodily functions Other ascites: chronic illness or injury  Amount and/or Complexity of Data Reviewed Independent Historian:     Details: She is a cogent historian Labs: ordered.    Details: CBC, metabolic panel, PT/INR, urinalysis, normal except sodium low, glucose high, BUN high, protein low, albumin low, AST high, ALT high, hemoglobin low, MCV high Radiology: ordered.    Details: MRI brain with and without contrast-bilateral metastases. Discussion of management or test interpretation with external provider(s): Discussed with oncology, who agrees to see patient as consultant tomorrow and with initiation of Decadron.  Consult hospitalist to admit for management.  Risk Prescription drug management. Decision regarding hospitalization. Risk Details: Patient presenting after having a seizure, 6 days ago.  She is on Keppra, prescribed after the incident.  She had a CT scan and then that was current discerning for edema, therefore worrisome for metastases.  She has known breast cancer.  Patient debilitated, worsening, now with bilateral intracranial metastases, likely contributing to weakness and some moderate confusion.  I suspect metastases also have led to new seizure disorder.  IV Decadron ordered and given in the ED.  Consulted oncology who will see patient tomorrow.  Arrange for admission by hospitalist service.  Critical Care Total time providing critical care: 50 minutes    {Document critical care  time when appropriate:1} {Document review of labs and clinical decision tools ie heart score, Chads2Vasc2 etc:1}  {Document your independent review of radiology images, and any outside records:1} {Document your discussion with family members, caretakers, and with consultants:1} {Document social determinants of health affecting pt's care:1} {Document your decision making why or why not admission, treatments were needed:1} Final Clinical Impression(s) / ED Diagnoses Final diagnoses:  Metastatic malignant neoplasm, unspecified site [facility])  Indigestion  Other ascites    Rx / DC Orders ED Discharge Orders     None

## 2022-03-24 NOTE — ED Notes (Signed)
Patient transported to MRI 

## 2022-03-24 NOTE — ED Triage Notes (Signed)
Pt reports falling down and hitting head & having seizure for the 1st time Friday.  Now c/o abd pain and leg swelling. Hx cancer

## 2022-03-24 NOTE — Progress Notes (Signed)
Subjective:    Patient ID: Brittany [name], female    DOB: 09/13/1956, 66 y.o.   MRN: [id number]     HPI Brittany [name] is here for follow up from her hospitalization in [place].  She is here today with her husband.   She fell in a bathroom last Friday in [place] while they were on a trip with [employer].  When he found her she was unconscious, she had hit her head and was bleeding.  Emergency services called, took her to [facility] in [place], [place].  On the way to the hospital she had a seizure.  That was Friday night, and she hospitalized for a few days.  They started her on Keppra, Decadron and have recommended an MRI of the brain when she gets back.  They did do a CT scan and no hemorrhage was detected, possible metastatic lesions were seen.      Still has headaches where she hit her head in the left forehead.  Ct scan in [place]- no hemorrhage.  Two areas - ? Cancer - needs MRI.  She has been taking the Keppra and Decadron.  She denies generalized headaches, blurry vision/double vision and nausea.   Indigestion, abdominal bloating.  This has been significant and she has not eaten anything since Friday.  Her abdomen is swollen, legs are swollen and she is having significant indigestion.  She denies any nausea.  She has not been able to eat.   Reviewed last oncology note and last blood work.  Medications and allergies reviewed with patient and updated if appropriate.  Medication list reviewed from [place] and medications updated  Current Outpatient Medications on File Prior to Visit  Medication Sig Dispense Refill   acetaminophen (TYLENOL) 500 MG tablet Take 1 tablet (500 mg total) by mouth in the morning, at noon, and at bedtime. Take with aleve 220 mg 90 tablet 0   calcium carbonate (TUMS - DOSED IN MG ELEMENTAL CALCIUM) 500 MG chewable tablet Chew 1 tablet by mouth daily.     Calcium Citrate 250 MG TABS Take 4 tablets (1,000 mg total) by mouth daily.  0    Cholecalciferol (VITAMIN D3) 1000 UNITS CAPS Take by mouth daily.     Denosumab (XGEVA [place]) Inject into the skin.     diclofenac sodium (VOLTAREN) 1 % GEL Apply 2 g topically 3 (three) times daily as needed. 100 g 1   furosemide (LASIX) 20 MG tablet Take 1 tablet (20 mg total) by mouth daily. 30 tablet 3   gabapentin (NEURONTIN) 300 MG capsule Take 1 capsule (300 mg total) by mouth 3 (three) times daily. 270 capsule 1   levothyroxine (SYNTHROID) 50 MCG tablet TAKE ONE TABLET BY MOUTH DAILY 90 tablet 3   Multiple Vitamin (MULTIVITAMIN) tablet Take 1 tablet by mouth daily.     naproxen sodium (ALEVE) 220 MG tablet Take 1 tablet (220 mg total) by mouth in the morning, at noon, and at bedtime. Take with tylenol 500 mg 90 tablet 4   Omega-3 Fatty Acids (FISH OIL) 1000 MG CPDR Take by mouth daily.     traMADol (ULTRAM) 50 MG tablet Take 1 tablet (50 mg total) by mouth every 6 (six) hours as needed. 20 tablet 0   UNABLE TO FIND Med Name : sildenafil 2% cream apply to hands and feet bid prn for CIPN 30 g 3   No current facility-administered medications on file prior to visit.     Review of  Systems  Eyes:  Negative for visual disturbance.  Gastrointestinal:  Negative for nausea.       Gerd  Neurological:  Positive for headaches.      Objective:   Vitals:   03/24/22 1449  BP: 120/78  Pulse: 85  Temp: 98.2 F (36.8 C)  SpO2: 93%   BP Readings from Last 3 Encounters:  03/24/22 120/78  03/05/22 121/74  02/18/22 130/80   Wt Readings from Last 3 Encounters:  03/05/22 160 lb 6.4 oz (72.8 kg)  02/15/22 151 lb (68.5 kg)  02/12/22 143 lb (64.9 kg)   Body mass index is 25.12 kg/m.    Physical Exam Constitutional:      General: She is not in acute distress.    Appearance: Normal appearance. She is ill-appearing.  HENT:     Head: Normocephalic.     Comments: Bruising left forehead, scab without active bleeding Abdominal:     General: There is distension.  Musculoskeletal:     Right  lower leg: Edema present.     Left lower leg: Edema present.  Skin:    General: Skin is warm and dry.  Neurological:     General: No focal deficit present.     Mental Status: She is alert.       Lab Results  Component Value Date   WBC 5.5 02/15/2022   HGB 8.4 (L) 02/15/2022   HCT 25.2 (L) 02/15/2022   PLT 82 (L) 02/15/2022   GLUCOSE 108 (H) 02/15/2022   CHOL 170 09/28/2019   TRIG 44.0 09/28/2019   HDL 81.10 09/28/2019   LDLCALC 80 09/28/2019   ALT 78 (H) 02/15/2022   AST 106 (H) 02/15/2022   NA 135 02/15/2022   K 4.2 02/15/2022   CL 102 02/15/2022   CREATININE 0.80 02/15/2022   BUN 13 02/15/2022   CO2 29 02/15/2022   TSH 1.754 10/28/2021   HGBA1C 5.4 09/28/2019     Assessment & Plan:    New onset seizure: Seizure in [place] CT scan of the head showed possible metastatic lesions Started on Keppra and Decadron No seizures since then No concerning symptoms Needs MRI of brain Needs neurology consult Needs follow-up with oncology  GERD, abdominal distention, leg edema: Acute Severe in nature-she has not been able to eat for several days and is likely dehydrated Likely related to liver cirrhosis with ascites Has been taking Lasix 40 mg daily, spironolactone 100 mg daily (started in [place]) Likely needs CT of the abdomen Needs labs Needs GI consult Needs PPI, IVF    Discussed with her and her husband the degree of testing and evaluation that she needs and needing it to be done quickly warrants being admitted to the hospital.  This cannot be done as an outpatient and they both agree.  They will go to [facility] long emergency room for further evaluation and likely admission.  We called ahead and updated the triage nurse.   I spent 30 minutes dedicated to the care of this patient on the date of this encounter including review of recent labs, imaging and procedures, speciality notes, obtaining history, communicating with the patient, ordering medications, tests, and  documenting clinical information in the EHR

## 2022-03-25 ENCOUNTER — Other Ambulatory Visit: Payer: Self-pay | Admitting: Radiation Oncology

## 2022-03-25 ENCOUNTER — Encounter (HOSPITAL_COMMUNITY): Payer: Self-pay | Admitting: Internal Medicine

## 2022-03-25 ENCOUNTER — Inpatient Hospital Stay (HOSPITAL_COMMUNITY): Payer: Medicare Other

## 2022-03-25 ENCOUNTER — Ambulatory Visit
Admit: 2022-03-25 | Discharge: 2022-03-25 | Disposition: A | Discharge status: Home / Self Care | Payer: Medicare Other | Attending: Radiation Oncology | Admitting: Radiation Oncology

## 2022-03-25 ENCOUNTER — Other Ambulatory Visit: Payer: Self-pay | Admitting: Radiation Therapy

## 2022-03-25 DIAGNOSIS — C7931 Secondary malignant neoplasm of brain: Secondary | ICD-10-CM

## 2022-03-25 DIAGNOSIS — Z170 Estrogen receptor positive status [ER+]: Secondary | ICD-10-CM

## 2022-03-25 DIAGNOSIS — D649 Anemia, unspecified: Secondary | ICD-10-CM | POA: Diagnosis present

## 2022-03-25 LAB — COMPREHENSIVE METABOLIC PANEL
ALT: 81 U/L — ABNORMAL HIGH (ref 0–44)
AST: 181 U/L — ABNORMAL HIGH (ref 15–41)
Albumin: 2.8 g/dL — ABNORMAL LOW (ref 3.5–5.0)
Alkaline Phosphatase: 118 U/L (ref 38–126)
Anion gap: 9 (ref 5–15)
BUN: 26 mg/dL — ABNORMAL HIGH (ref 8–23)
CO2: 25 mmol/L (ref 22–32)
Calcium: 8.2 mg/dL — ABNORMAL LOW (ref 8.9–10.3)
Chloride: 100 mmol/L (ref 98–111)
Creatinine, Ser: 0.78 mg/dL (ref 0.44–1.00)
GFR, Estimated: >60 mL/min (ref >60)
Glucose, Bld: 109 mg/dL — ABNORMAL HIGH (ref 70–99)
Potassium: 3.9 mmol/L (ref 3.5–5.1)
Sodium: 134 mmol/L — ABNORMAL LOW (ref 135–145)
Total Bilirubin: 1.9 mg/dL — ABNORMAL HIGH (ref 0.3–1.2)
Total Protein: 6 g/dL — ABNORMAL LOW (ref 6.5–8.1)

## 2022-03-25 LAB — CBC WITH DIFFERENTIAL/PLATELET
Abs Immature Granulocytes: 0.5 10*3/uL — ABNORMAL HIGH (ref 0.00–0.07)
Basophils Absolute: 0.1 10*3/uL (ref 0.0–0.1)
Basophils Relative: 1 %
Eosinophils Absolute: 0 10*3/uL (ref 0.0–0.5)
Eosinophils Relative: 0 %
HCT: 28.3 % — ABNORMAL LOW (ref 36.0–46.0)
Hemoglobin: 9.1 g/dL — ABNORMAL LOW (ref 12.0–15.0)
Immature Granulocytes: 8 %
Lymphocytes Relative: 20 %
Lymphs Abs: 1.3 10*3/uL (ref 0.7–4.0)
MCH: 38.9 pg — ABNORMAL HIGH (ref 26.0–34.0)
MCHC: 32.2 g/dL (ref 30.0–36.0)
MCV: 120.9 fL — ABNORMAL HIGH (ref 80.0–100.0)
Monocytes Absolute: 0.4 10*3/uL (ref 0.1–1.0)
Monocytes Relative: 7 %
Neutro Abs: 4 10*3/uL (ref 1.7–7.7)
Neutrophils Relative %: 64 %
Platelets: 133 10*3/uL — ABNORMAL LOW (ref 150–400)
RBC: 2.34 MIL/uL — ABNORMAL LOW (ref 3.87–5.11)
RDW: 18.4 % — ABNORMAL HIGH (ref 11.5–15.5)
WBC: 6.2 10*3/uL (ref 4.0–10.5)
nRBC: 33.8 % — ABNORMAL HIGH (ref 0.0–0.2)

## 2022-03-25 LAB — MAGNESIUM
Magnesium: 2.1 mg/dL (ref 1.7–2.4)
Magnesium: 2.3 mg/dL (ref 1.7–2.4)

## 2022-03-25 MED ORDER — DEXAMETHASONE 4 MG PO TABS: 4.0000 mg | ORAL_TABLET | Freq: Three times a day (TID) | ORAL | 1 refills | Status: AC

## 2022-03-25 MED ORDER — LEVOTHYROXINE SODIUM 50 MCG PO TABS
50.0000 ug | ORAL_TABLET | Freq: Every day | ORAL | Status: DC
  Administered 2022-03-25 – 2022-03-26 (×2): 50 ug via ORAL
  Filled 2022-03-25 (×2): qty 1

## 2022-03-25 MED ORDER — DEXAMETHASONE SODIUM PHOSPHATE 10 MG/ML IJ SOLN
10.0000 mg | INTRAMUSCULAR | Status: DC
Start: 2022-03-25 — End: 2022-03-25
  Administered 2022-03-25: 10 mg via INTRAVENOUS
  Filled 2022-03-25: qty 1

## 2022-03-25 MED ORDER — ACETAMINOPHEN 325 MG PO TABS: 650.0000 mg | ORAL_TABLET | Freq: Four times a day (QID) | ORAL | Status: DC | PRN

## 2022-03-25 MED ORDER — IOHEXOL 300 MG/ML  SOLN
100.0000 mL | Freq: Once | INTRAMUSCULAR | Status: AC | PRN
  Administered 2022-03-25: 100 mL via INTRAVENOUS

## 2022-03-25 MED ORDER — IOHEXOL 9 MG/ML PO SOLN
500.0000 mL | ORAL | Status: AC
  Administered 2022-03-25 (×2): 500 mL via ORAL

## 2022-03-25 MED ORDER — MEMANTINE HCL 5 MG PO TABS: ORAL_TABLET | ORAL | 0 refills | Status: AC

## 2022-03-25 MED ORDER — LEVETIRACETAM 500 MG PO TABS
500.0000 mg | ORAL_TABLET | Freq: Two times a day (BID) | ORAL | Status: DC
  Administered 2022-03-25 – 2022-03-26 (×3): 500 mg via ORAL
  Filled 2022-03-25 (×3): qty 1

## 2022-03-25 MED ORDER — DEXAMETHASONE 4 MG PO TABS
4.0000 mg | ORAL_TABLET | Freq: Three times a day (TID) | ORAL | Status: DC
  Administered 2022-03-25 – 2022-03-26 (×4): 4 mg via ORAL
  Filled 2022-03-25 (×4): qty 1

## 2022-03-25 MED ORDER — ACETAMINOPHEN 650 MG RE SUPP: 650.0000 mg | Freq: Four times a day (QID) | RECTAL | Status: DC | PRN

## 2022-03-25 MED ORDER — ONDANSETRON HCL 4 MG/2ML IJ SOLN: 4.0000 mg | Freq: Four times a day (QID) | INTRAMUSCULAR | Status: DC | PRN

## 2022-03-25 MED ORDER — MEMANTINE HCL 10 MG PO TABS: 10.0000 mg | ORAL_TABLET | Freq: Two times a day (BID) | ORAL | 4 refills | Status: AC

## 2022-03-25 NOTE — H&P (Signed)
History and Physical    PLEASE NOTE THAT DRAGON DICTATION SOFTWARE WAS USED IN THE CONSTRUCTION OF THIS NOTE.   [name] KVQ:[id number] DOB: October 26, 1955 DOA: 03/24/2022  PCP: Brittany Rail, MD  Patient coming from: home   I have personally briefly reviewed patient's old medical records in [place]  Chief Complaint: Follow-up for recent seizures  HPI: Brittany [name] is a 66 y.o. female with medical history significant for metastatic breast cancer, acquired hypothyroidism, recently diagnosed seizures, chronic anemia with baseline hemoglobin 8-10, who is admitted to [facility] on 03/24/2022 with new diagnosis of metastatic disease to the brain after presenting from home to [facility] ED for follow-up following recent diagnosis of seizures.  D patient was recently evaluated last Friday, 6 days ago, after a single episode of syncope associated with seizures.  She started on Keppra at that time, CT head showed no evidence of acute intracranial process.  She was discharged home, with instructions to follow up as an outpatient for this episode of syncope as well as new diagnosis of seizures.  In attempting to comply with this recommendation for follow-up, the patient presented to [facility] emergency department this evening, at which time MRI brain revealed evidence of new diagnosis of intracranial metastasis with evidence of vasogenic edema.  She is without acute complaint at this time.  Denies any interval seizures and no additional episodes of syncope.      ED Course:  Labs were notable for the following: CMP notable for the following: Creatinine 0.60.  CBC notable for white cell count 6800, hemoglobin 9.3 compared to most recent prior value of 8.4 on 02/15/2022.  Urinalysis showed no evidence of white blood cells.  Imaging and additional notable ED work-up: MRI brain with and without contrast showed multiple scattered dural, leptomeningeal and parenchymal intracranial  metastasis associate with vasogenic edema at the anterior left temporal lobe without signs of mass effect, also demonstrating no evidence of acute infarct.  EDP discussed patient's case/imaging with on-call oncology, who will formally consult, and see the patient in the morning, recommending interval Decadron.  While in the ED, the following were administered: Decadron 10 g IV x1.  Subsequently, the patient was admitted for further evaluation and management of new diagnosis of metastasis to the brain.    Review of Systems: As per HPI otherwise 10 point review of systems negative.   Past Medical History:  Diagnosis Date   ANEMIA-NOS    Asymptomatic varicose veins    Bone metastases 2021   Breast cancer ([place]) 1996   LEFT BREAST CA   BREAST CANCER, HX OF 01/1995 dx   s/p Lumpectomy, XRT, chemo and 62yrarmidex   Complication of anesthesia    Gestational diabetes mellitus in childbirth, diet controlled 1985   Hypothyroid 09/28/2015   Dx 09/2015   Leukopenia    mild since chemo   OSTEOARTHRITIS    Personal history of chemotherapy    Personal history of radiation therapy    SCOLIOSIS, MILD     Past Surgical History:  Procedure Laterality Date   BREAST BIOPSY     BREAST LUMPECTOMY Left    1996   HIP CLOSED REDUCTION N/A 02/12/2022   Procedure: CLOSED MANIPULATION HIP;  Surgeon: CTania Ade MD;  Location: [facility] ORS;  Service: Orthopedics;  Laterality: N/A;   left lumpectomy  1[place]    2002 & 2003-[facility] vail [facility]  Social History:  reports that she quit smoking about 44 years ago. Her smoking use included cigarettes. She has never used smokeless tobacco. She reports current alcohol use of about 3.0 - 4.0 standard drinks per week. She reports that she does not use drugs.   No Known Allergies  Family History  Problem Relation Age of Onset   Heart disease Father 63       AMI   Hyperlipidemia Father    Valvular heart disease Father  77   Parkinson's disease Father 59   Arthritis Mother    Breast cancer Mother 24   Hyperlipidemia Mother    Osteoporosis Mother    Pancreatic cancer Maternal Grandmother 52       presumed dx   Diabetes Paternal Grandfather        type 2   Breast cancer Maternal Aunt     Family history reviewed and not pertinent    Prior to Admission medications   Medication Sig Start Date End Date Taking? Authorizing Provider  calcium carbonate (TUMS - DOSED IN MG ELEMENTAL CALCIUM) 500 MG chewable tablet Chew 1 tablet by mouth 4 (four) times daily as needed for indigestion or heartburn.   Yes [provider]  Cholecalciferol (VITAMIN D3) 1000 UNITS CAPS Take 1 capsule by mouth daily.   Yes [provider]  dexamethasone (DECADRON) 2 MG tablet Take 2 tablets (4 mg total) by mouth 3 (three) times daily. 03/24/22  Yes Burns, Claudina Lick, MD  furosemide (LASIX) 40 MG tablet Take 40 mg by mouth daily.   Yes [provider]  levETIRAcetam (KEPPRA) 500 MG tablet Take 1 tablet (500 mg total) by mouth 2 (two) times daily. 03/24/22  Yes Burns, Claudina Lick, MD  levothyroxine (SYNTHROID) 50 MCG tablet TAKE ONE TABLET BY MOUTH DAILY 12/07/21  Yes Burns, Claudina Lick, MD  spironolactone (ALDACTONE) 100 MG tablet Take 1 tablet (100 mg total) by mouth daily. 03/24/22  Yes Burns, Claudina Lick, MD  UNABLE TO FIND Med Name : sildenafil 2% cream apply to hands and feet bid prn for CIPN 01/25/22  Yes Iruku, Arletha Pili, MD  Calcium Citrate 250 MG TABS Take 4 tablets (1,000 mg total) by mouth daily. Patient not taking: Reported on 03/24/2022 09/14/13   Rowe Clack, MD  furosemide (LASIX) 20 MG tablet Take 2 tablets (40 mg total) by mouth daily. Patient not taking: Reported on 03/24/2022 03/24/22   Brittany Rail, MD     Objective    Physical Exam: Vitals:   03/24/22 2030 03/24/22 2351 03/25/22 0115 03/25/22 0214  BP: 137/73 127/63 127/88 (!) 143/71  Pulse: 94 84 81 79  Resp: '19 16 16 18  '$ Temp:   97.6 F  (36.4 C) 98.5 F (36.9 C)  TempSrc:    Oral  SpO2: 90% 94% 94% 97%  Weight:    76.6 kg  Height:    '5\' 7"'$  (1.702 m)    General: appears to be stated age; alert, oriented Skin: warm, dry, no rash Head:  AT/Foley Mouth:  Oral mucosa membranes appear moist, normal dentition Neck: supple; trachea midline Heart:  RRR; did not appreciate any M/R/G Lungs: CTAB, did not appreciate any wheezes, rales, or rhonchi Abdomen: + BS; soft, ND, NT Vascular: 2+ pedal pulses b/l; 2+ radial pulses b/l Extremities: no peripheral edema, no muscle wasting Neuro: strength and sensation intact in upper and lower extremities b/l    Labs on Admission: I have personally reviewed following labs and imaging studies  CBC:  Recent Labs  Lab 03/24/22 2057 03/25/22 0423  WBC 6.8 6.2  NEUTROABS 4.2 4.0  HGB 9.3* 9.1*  HCT 28.9* 28.3*  MCV 119.4* 120.9*  PLT 136* 035*   Basic Metabolic Panel: Recent Labs  Lab 03/24/22 0040 03/24/22 2057 03/25/22 0423  NA  --  132* 134*  K  --  3.8 3.9  CL  --  98 100  CO2  --  26 25  GLUCOSE  --  114* 109*  BUN  --  25* 26*  CREATININE  --  0.60 0.78  CALCIUM  --  8.1* 8.2*  MG 2.1  --  2.3   GFR: Estimated Creatinine Clearance: 72.8 mL/min (by C-G formula based on SCr of 0.78 mg/dL). Liver Function Tests: Recent Labs  Lab 03/24/22 2057 03/25/22 0423  AST 179* 181*  ALT 79* 81*  ALKPHOS 117 118  BILITOT 1.8* 1.9*  PROT 5.8* 6.0*  ALBUMIN 2.8* 2.8*   No results for input(s): LIPASE, AMYLASE in the last 168 hours. No results for input(s): AMMONIA in the last 168 hours. Coagulation Profile: Recent Labs  Lab 03/24/22 2057  INR 1.5*   Cardiac Enzymes: No results for input(s): CKTOTAL, CKMB, CKMBINDEX, TROPONINI in the last 168 hours. BNP (last 3 results) No results for input(s): PROBNP in the last 8760 hours. HbA1C: No results for input(s): HGBA1C in the last 72 hours. CBG: No results for input(s): GLUCAP in the last 168 hours. Lipid Profile: No  results for input(s): CHOL, HDL, LDLCALC, TRIG, CHOLHDL, LDLDIRECT in the last 72 hours. Thyroid Function Tests: No results for input(s): TSH, T4TOTAL, FREET4, T3FREE, THYROIDAB in the last 72 hours. Anemia Panel: No results for input(s): VITAMINB12, FOLATE, FERRITIN, TIBC, IRON, RETICCTPCT in the last 72 hours. Urine analysis:    Component Value Date/Time   COLORURINE YELLOW 03/24/2022 1820   APPEARANCEUR CLEAR 03/24/2022 1820   LABSPEC 1.012 03/24/2022 1820   PHURINE 6.0 03/24/2022 1820   GLUCOSEU NEGATIVE 03/24/2022 1820   GLUCOSEU NEGATIVE 09/17/2014 0824   HGBUR NEGATIVE 03/24/2022 1820   BILIRUBINUR NEGATIVE 03/24/2022 1820   KETONESUR NEGATIVE 03/24/2022 1820   PROTEINUR NEGATIVE 03/24/2022 1820   UROBILINOGEN 0.2 09/17/2014 0824   NITRITE NEGATIVE 03/24/2022 1820   LEUKOCYTESUR TRACE (A) 03/24/2022 1820    Radiological Exams on Admission: MR Brain W and Wo Contrast  Result Date: 03/24/2022 CLINICAL DATA:  Initial evaluation for metastatic disease. EXAM: MRI HEAD WITHOUT AND WITH CONTRAST TECHNIQUE: Multiplanar, multiecho pulse sequences of the brain and surrounding structures were obtained without and with intravenous contrast. CONTRAST:  74m GADAVIST GADOBUTROL 1 MMOL/ML IV SOLN COMPARISON:  None available. FINDINGS: Brain: Cerebral volume within normal limits. Irregular dural thickening and enhancement seen overlying the anterior left temporal lobe, consistent with metastatic disease. Associated leptomeningeal involvement with extension to involve the underlying brain parenchyma (series 11, image 51). Associated localized vasogenic edema at this location without significant regional mass effect. Additional probable dural involvement noted superiorly and anteriorly overlying the frontal lobes bilaterally (series 11, image 77). Patchy dural and leptomeningeal involvement noted along the adjacent anterior falx (series 11, image 76). Few additional small left open in G ule and/or  parenchymal metastases noted elsewhere involving both cerebral hemispheres, largest of which seen at the posterior left frontal region and measures 7 mm (series 11, image 110). Few small metastatic deposits noted involving the right cerebellum (series 11, image 27) and right paramedian cerebellar vermis (series 11, image 49). Probable involvement of the right 7/8 nerve root complex  at the right IAC (series 11, image 27). No other significant associated edema. No associated hemorrhage. No evidence for acute or subacute infarct. Gray-white matter differentiation otherwise maintained. No other areas of chronic cortical infarction or other insult. No acute or chronic intracranial blood products. Ventricles normal size without hydrocephalus. No extra-axial fluid collection. Pituitary gland suprasellar region within normal limits. Midline structures intact and normally formed. Vascular: Major intracranial vascular flow voids are maintained. Asymmetric FLAIR signal intensity without T1 correlate involving the left transverse sinus noted, likely slow/sluggish flow. Skull and upper cervical spine: Craniocervical junction within normal limits. Bone marrow signal intensity is diffusely heterogeneous without visible focal marrow replacing lesion. Tiny 4 mm nodular FLAIR hyperintensity involving the left frontal scalp noted, indeterminate (series 11, image 34). No other focal scalp lesions. Sinuses/Orbits: Globes and orbital soft tissues demonstrate no acute finding. Paranasal sinuses are largely clear. No significant mastoid effusion. Other: None. IMPRESSION: 1. Multiple scattered dural, leptomeningeal, and parenchymal intracranial metastasis as detailed above. Associated vasogenic edema at the anterior left temporal lobe without significant regional mass effect. 2. 4 mm nodular FLAIR hyperintensity involving the left frontal scalp, indeterminate. Correlation with physical exam recommended. 3. No other acute intracranial  abnormality. Electronically Signed   By: Jeannine Boga M.D.   On: 03/24/2022 22:54      Assessment/Plan    Principal Problem:   Metastasis to brain [facility]) Active Problems:   History of seizure   Acquired hypothyroidism   Chronic anemia      #) Metastasis to the brain: New diagnosis made via MRI brain performed this evening as follow-up for recent new onset seizures/syncope 6 days ago, and associated with evidence of vasogenic edema without associated mass effect, as further detailed above.  EDP discussed patient's case with on-call oncology, who formally consulted and see the patient in the morning, recommending interval Decadron, which she has received.  No evidence of additional seizures or syncope since arrival.  This is in the context of a documented history of metastatic breast cancer.  Plan: Oncology consulted, as above.  Decadron, per oncology recommendations.  Every 4 hour neurochecks.  Repeat CMP and CBC in the morning.  Monitor on symmetry.          #) acquired hypothyroidism: documented h/o such, on Synthroid as outpatient.   Plan: cont home Synthroid.            #) History of seizures: Single seizure reported 6 days ago, with interval initiation of Keppra.  It appears that this is related to new diagnosis of metastatic disease to the brain associated vasogenic edema, as above.  No evidence of active seizures at this time.  Plan: Continue home [place].  Every 4 hours neurochecks.  Further evaluation and management new diagnosis of metastasis to the brain, as above, including continuation of Decadron.        #) Chronic anemia: Documented history of such, associated with hemoglobin range of 8-10, with presenting hemoglobin found to be consistent with this range.  No evidence of active bleed.  Suspect that this is multifactorial, with contributions from anemia of chronic disease, as well as potential intubation from potential liver involvement in the  setting of metastatic disease, given macrocytic findings at presentation.  Plan: Repeat CBC in the morning.  Check INR.     DVT prophylaxis: SCD's   Code Status: Full code Family Communication: none Disposition Plan: Per Rounding Team Consults called: EDP discussed patient's case with on-call oncology, who will formally consult in the  morning, as above.;  Admission status: Inpatient   PLEASE NOTE THAT DRAGON DICTATION SOFTWARE WAS USED IN THE CONSTRUCTION OF THIS NOTE.   [place] DO [facility] [place]   03/25/2022, 6:50 AM

## 2022-03-25 NOTE — Assessment & Plan Note (Signed)
   #)   acquired hypothyroidism: documented h/o such, on Synthroid as outpatient.   Plan: cont home Synthroid.     

## 2022-03-25 NOTE — Progress Notes (Signed)
Assessed Rt arm for anywhere to place an IV- noted recent infiltrates in Rt lower FA and Rt inner upper FA. Pt stated she had breast cancer on Rt side 1996. Needs IV access for CT and meds - MD notified in secure chat - placed IV # 20 , 1.88 "Lt upper arm with ultrasound.

## 2022-03-25 NOTE — Progress Notes (Signed)
PROGRESS NOTE    Brittany [name]  SLH:[id number] DOB: 10-31-1955 DOA: 03/24/2022 PCP: Binnie Rail, MD    Brief Narrative:  Brittany [name] is a 66 y.o. 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    Elevated LFTs- known liver cirrhosis with ascites -resume lasix/aldactone when able    DVT prophylaxis: SCDs Start: 03/25/22 0000    Code Status: Full Code Family Communication:   Disposition Plan:  Level of care: Progressive Status is: Inpatient Remains inpatient appropriate because: needs further work up/treatment    Consultants:  oncology  Subjective: No further seizures  Objective: Vitals:   03/25/22 0214 03/25/22 0659 03/25/22 1022 03/25/22 1023  BP: (!) 143/71 (!) 145/69  (!) 154/100  Pulse: 79 89  97  Resp: '18 18  20  '$ Temp: 98.5 F (36.9 C) 97.9 F (36.6 C) 98.3 F (36.8 C)   TempSrc: Oral  Oral   SpO2: 97% 93%  94%  Weight: 76.6 kg     Height: '5\' 7"'$  (1.702 m)       Intake/Output Summary (Last 24 hours) at 03/25/2022 1214 Last data filed at 03/25/2022 1000 Gross per 24 hour  Intake 763.73 ml  Output --  Net 763.73 ml   Filed Weights   03/25/22 0214  Weight: 76.6 kg    Examination:   General: Appearance:     Overweight female in no acute distress     Lungs:     respirations unlabored        Data Reviewed: I  have personally reviewed following labs and imaging studies  CBC: Recent Labs  Lab 03/24/22 2057 03/25/22 0423  WBC 6.8 6.2  NEUTROABS 4.2 4.0  HGB 9.3* 9.1*  HCT 28.9* 28.3*  MCV 119.4* 120.9*  PLT 136* 157*   Basic Metabolic Panel: Recent Labs  Lab 03/24/22 0040 03/24/22 2057 03/25/22 0423  NA  --  132* 134*  K  --  3.8 3.9  CL  --  98 100  CO2  --  26 25  GLUCOSE  --  114* 109*  BUN  --  25* 26*  CREATININE  --  0.60 0.78  CALCIUM  --  8.1* 8.2*  MG 2.1  --  2.3   GFR: Estimated Creatinine Clearance: 72.8 mL/min (by C-G formula based on SCr of 0.78 mg/dL). Liver Function Tests: Recent Labs  Lab 03/24/22 2057 03/25/22 0423  AST 179* 181*  ALT 79* 81*  ALKPHOS 117 118  BILITOT 1.8* 1.9*  PROT 5.8* 6.0*  ALBUMIN 2.8* 2.8*   No results for input(s): LIPASE, AMYLASE in the last 168 hours. No results for input(s): AMMONIA in the last 168 hours. Coagulation Profile: Recent Labs  Lab 03/24/22 2057  INR 1.5*   Cardiac Enzymes: No results for input(s): CKTOTAL, CKMB, CKMBINDEX, TROPONINI in the last 168 hours. BNP (last 3 results) No results for input(s): PROBNP in the last 8760 hours. HbA1C: No results for input(s): HGBA1C  in the last 72 hours. CBG: No results for input(s): GLUCAP in the last 168 hours. Lipid Profile: No results for input(s): CHOL, HDL, LDLCALC, TRIG, CHOLHDL, LDLDIRECT in the last 72 hours. Thyroid Function Tests: No results for input(s): TSH, T4TOTAL, FREET4, T3FREE, THYROIDAB in the last 72 hours. Anemia Panel: No results for input(s): VITAMINB12, FOLATE, FERRITIN, TIBC, IRON, RETICCTPCT in the last 72 hours. Sepsis Labs: No results for input(s): PROCALCITON, LATICACIDVEN in the last 168 hours.  No results found for this or any previous visit (from the past 240 hour(s)).       Radiology Studies: MR Brain W and Wo Contrast  Result Date: 03/24/2022 CLINICAL DATA:  Initial evaluation for metastatic disease. EXAM: MRI HEAD  WITHOUT AND WITH CONTRAST TECHNIQUE: Multiplanar, multiecho pulse sequences of the brain and surrounding structures were obtained without and with intravenous contrast. CONTRAST:  27m GADAVIST GADOBUTROL 1 MMOL/ML IV SOLN COMPARISON:  None available. FINDINGS: Brain: Cerebral volume within normal limits. Irregular dural thickening and enhancement seen overlying the anterior left temporal lobe, consistent with metastatic disease. Associated leptomeningeal involvement with extension to involve the underlying brain parenchyma (series 11, image 51). Associated localized vasogenic edema at this location without significant regional mass effect. Additional probable dural involvement noted superiorly and anteriorly overlying the frontal lobes bilaterally (series 11, image 77). Patchy dural and leptomeningeal involvement noted along the adjacent anterior falx (series 11, image 76). Few additional small left open in G ule and/or parenchymal metastases noted elsewhere involving both cerebral hemispheres, largest of which seen at the posterior left frontal region and measures 7 mm (series 11, image 110). Few small metastatic deposits noted involving the right cerebellum (series 11, image 27) and right paramedian cerebellar vermis (series 11, image 49). Probable involvement of the right 7/8 nerve root complex at the right IAC (series 11, image 27). No other significant associated edema. No associated hemorrhage. No evidence for acute or subacute infarct. Gray-white matter differentiation otherwise maintained. No other areas of chronic cortical infarction or other insult. No acute or chronic intracranial blood products. Ventricles normal size without hydrocephalus. No extra-axial fluid collection. Pituitary gland suprasellar region within normal limits. Midline structures intact and normally formed. Vascular: Major intracranial vascular flow voids are maintained. Asymmetric FLAIR signal intensity without T1 correlate involving  the left transverse sinus noted, likely slow/sluggish flow. Skull and upper cervical spine: Craniocervical junction within normal limits. Bone marrow signal intensity is diffusely heterogeneous without visible focal marrow replacing lesion. Tiny 4 mm nodular FLAIR hyperintensity involving the left frontal scalp noted, indeterminate (series 11, image 34). No other focal scalp lesions. Sinuses/Orbits: Globes and orbital soft tissues demonstrate no acute finding. Paranasal sinuses are largely clear. No significant mastoid effusion. Other: None. IMPRESSION: 1. Multiple scattered dural, leptomeningeal, and parenchymal intracranial metastasis as detailed above. Associated vasogenic edema at the anterior left temporal lobe without significant regional mass effect. 2. 4 mm nodular FLAIR hyperintensity involving the left frontal scalp, indeterminate. Correlation with physical exam recommended. 3. No other acute intracranial abnormality. Electronically Signed   By: BJeannine BogaM.D.   On: 03/24/2022 22:54        Scheduled Meds:  dexamethasone (DECADRON) injection  10 mg Intravenous Q24H   levETIRAcetam  500 mg Oral BID   levothyroxine  50 mcg Oral Q0600   Continuous Infusions:  sodium chloride 125 mL/hr at 03/25/22 0752     LOS: 1 day    Time spent: 45 minutes spent on chart review, discussion with nursing staff, consultants, updating  family and interview/physical exam; more than 50% of that time was spent in counseling and/or coordination of care.    Geradine Girt, DO [facility] Available via Epic secure chat 7am-7pm After these hours, please refer to coverage provider listed on [email or web address] 03/25/2022, 12:14 PM

## 2022-03-25 NOTE — Progress Notes (Signed)
I spoke with Dr. Chryl Heck and she requests the patient also have restaging CT CAP and the patient and her husband are in agreement. Orders will be placed to be performed today.

## 2022-03-25 NOTE — Assessment & Plan Note (Signed)
 #)   Chronic anemia: Documented history of such, associated with hemoglobin range of 8-10, with presenting hemoglobin found to be consistent with this range.  No evidence of active bleed.  Suspect that this is multifactorial, with contributions from anemia of chronic disease, as well as potential intubation from potential liver involvement in the setting of metastatic disease, given macrocytic findings at presentation.  Plan: Repeat CBC in the morning.  Check INR.

## 2022-03-25 NOTE — Consult Note (Addendum)
Radiation Oncology         [phone] ________________________________  Name: Brittany [name]        MRN: [id number]  Date of Service:  03/25/22           DOB: 26-Apr-1956  OV:ZCHYI, Claudina Lick, MD Dr. Chryl Heck  REFERRING PHYSICIAN: Dr. Chryl Heck   DIAGNOSIS: The primary encounter diagnosis was Metastatic malignant neoplasm, unspecified site [facility]). Diagnoses of Indigestion and Other ascites were also pertinent to this visit.   HISTORY OF PRESENT ILLNESS: Brittany [name] is a 66 y.o. female seen at the request of Dr. Chryl Heck with a history of left breast cancer treated in 1996 with lumpectomy adjuvant chemotherapy, radiation and antiestrogen.  At that time 2 of the 10 lymph nodes were involved and the phenotype was lobular.  She took tamoxifen for many years, she was released from medical follow-up until she was found in 2021 on heightened screening protocols with MRI to have a new irregular 4 mm lesion in the upper inner quadrant of the right breast the left breast continues to be unremarkable.  That MRI also showed areas of enhancement midsternum.  Additional biopsy of the right breast was planned but was canceled due to it being felt to be a torturous vein in that location however bone scan in January 2021 showed evidence of activity in the thoracic and lumbar spine, ribs and sternum. She underwent a bone marrow biopsy on 01/25/20 that showed metastatic carcinoma consistent with breast primary and the cells were triple negative. She was started on Xgeva and has cycled on and off capecitabine due to PPE since May 2021.  She saw Dr. Chryl Heck in April 2023 to review an MRI total spine from March 2023 which showed unchanged widespread osseous metastasis no acute fracture or epidural tumor was identified. CT of the chest at that time showed no interval change from her prior in November 2022 and a CT abdomen pelvis was ordered to conclude the restaging work-up.  This was performed on 02/11/2022 and showed an increase  in splenic size small volume ascites and developing portosystemic systemic collaterals suspicious for cirrhotic/liver disease, heterogeneity in the left hepatic lobe was nonspecific and a new small right hemiliver lesion could not rule out disease.  Hypodense lesion in the pancreatic head was stable since 2021 and no additional progressive findings were noted.  Dr. Chryl Heck recommended repeating imaging at short interval.  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  Coarse contour of the liver with heterogeneous Henrene Pastor parenchymal enhancement was also seen consistent with findings of cirrhosis.  She was seen again in medical oncology clinic on 03/05/2022 and she was planning a small holiday from treatment to enjoy a trip to [place].  She was encouraged to meet with gastroenterology when she returned for management of her cirrhosis, and trying to revisit either Xeloda versus CMF chemotherapy.  She was seen by her PCP however yesterday with a new onset of seizure while she had been [place] in [place].  A CT scan there showed possible metastatic lesions and she was started on Keppra and Decadron.  She was seizure-free but she was encouraged to present to the emergency room for further evaluation and likely admission.  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  Additional probable dural involvement superiorly and anteriorly over the frontal lobes  bilaterally was seen.  Patchy dural and leptomeningeal involvement noted along the adjacent anterior falx was seen and additional lesions were seen elsewhere involving both cerebral hemispheres the largest of which measures 7 mm, a few metastatic deposits were noted involving the right cerebellum and right paramedian cerebellar vermis probable involvement of the 7th and 8th cranial nerve root complex and no evidence of infarct or hemorrhage was identified.  There was asymmetric FLAIR intensity without T1 correlate involving the left transverse sinus likely slower sluggish flow, bone marrow signaling of the upper cervical spine and skull was diffusely heterogeneous without visible marrow replacing lesion, a tiny 4 mm flair hyperintensity involving the left frontal scalp was noted but indeterminate.  Given these findings medical oncology has seen the patient and requested that we consider a palliative course of radiotherapy for her brain.   PREVIOUS RADIATION THERAPY: Yes   1996: The left breast and regional lymph nodes were treated with adjuvant radiotherapy, details unfortunately are not available at this time but I suspect she received about 6 1/2 weeks of radiotherapy at that time.  PAST MEDICAL HISTORY:  Past Medical History:  Diagnosis Date   ANEMIA-NOS    Asymptomatic varicose veins    Bone metastases 2021   Breast cancer ([place]) 1996   LEFT BREAST CA   BREAST CANCER, HX OF 01/1995 dx   s/p Lumpectomy, XRT, chemo and 46yrarmidex   Complication of anesthesia    Gestational diabetes mellitus in childbirth, diet controlled 1985   Hypothyroid 09/28/2015   Dx 09/2015   Leukopenia    mild since chemo   OSTEOARTHRITIS    Personal history of chemotherapy    Personal history of radiation therapy    SCOLIOSIS, MILD        PAST SURGICAL HISTORY: Past Surgical History:  Procedure Laterality Date   BREAST BIOPSY     BREAST LUMPECTOMY Left    1996   HIP CLOSED REDUCTION N/A 02/12/2022    Procedure: CLOSED MANIPULATION HIP;  Surgeon: CTania Ade MD;  Location: [facility];  Service: Orthopedics;  Laterality: N/A;   left lumpectomy  [place]    2002 & 2003-Dr. vail @ [facility]     FAMILY HISTORY:  Family History  Problem Relation Age of Onset   Heart disease Father 587      AMI   Hyperlipidemia Father    Valvular heart disease Father 778  Parkinson's disease Father 769  Arthritis Mother    Breast cancer Mother 58  Hyperlipidemia Mother    Osteoporosis Mother    Pancreatic cancer Maternal Grandmother 73       presumed dx   Diabetes Paternal Grandfather        type 2   Breast cancer Maternal Aunt      SOCIAL HISTORY:  reports that she quit smoking about 44 years ago. Her smoking use included cigarettes. She has never used smokeless tobacco. She reports current alcohol use of about 3.0 - 4.0 standard drinks per week. She reports that she does not use drugs.  The patient is married and has 2 adult  children.  The notes from Dr. Chryl Heck and Dr. Virgie Dad previous notes indicate that the patient has a psychology and IT degree and that the patient was in [place] at the time of the [place] disaster and could have been exposed to some radiation at that time.  She retired in June 2021.   ALLERGIES: Patient has no known allergies.   MEDICATIONS:  Current Facility-Administered Medications  Medication Dose Route Frequency Provider Last Rate Last Admin   0.9 %  sodium chloride infusion   Intravenous Continuous Daleen Bo, MD 125 mL/hr at 03/25/22 0752 New Bag at 03/25/22 0752   acetaminophen (TYLENOL) tablet 650 mg  650 mg Oral Q6H PRN Howerter, Justin B, DO       Or   acetaminophen (TYLENOL) suppository 650 mg  650 mg Rectal Q6H PRN Howerter, Justin B, DO       dexamethasone (DECADRON) injection 10 mg  10 mg Intravenous Q24H Howerter, Justin B, DO   10 mg at 03/25/22 0749   levETIRAcetam (KEPPRA) tablet 500 mg  500 mg Oral BID  Howerter, Justin B, DO   500 mg at 03/25/22 0900   levothyroxine (SYNTHROID) tablet 50 mcg  50 mcg Oral Q0600 Howerter, Justin B, DO   50 mcg at 03/25/22 0749   ondansetron (ZOFRAN) injection 4 mg  4 mg Intravenous Q6H PRN Howerter, Justin B, DO         REVIEW OF SYSTEMS: On review of systems, the patient reports that she feel last Friday while in [place] on a trip with [employer] and had not had prior headaches, visual, auditory, movement, or speech changes leading up to her fall. She hit her head and bruised her face and had a laceration to her skull. She was seen in a local hospital but while on the way in the ambulance had a seizure and reports was given 2 mg Dexamethasone to be taken TID, and Keppra 500 mg to be taken BID. She made adjustments in the Keppra dose and took half of the prescribed dose after guidance from a pharmacist. She reports that she's not had any headaches, extremity weakness, numbness, changes in cognition, or additional seizures. She has been very fatigued as of late. No other complaints are verbalized.      PHYSICAL EXAM:  Wt Readings from Last 3 Encounters:  03/25/22 168 lb 14.4 oz (76.6 kg)  03/05/22 160 lb 6.4 oz (72.8 kg)  02/15/22 151 lb (68.5 kg)   Temp Readings from Last 3 Encounters:  03/25/22 98.3 F (36.8 C) (Oral)  03/24/22 98.2 F (36.8 C) (Oral)  03/05/22 97.9 F (36.6 C) (Temporal)   BP Readings from Last 3 Encounters:  03/25/22 (!) 154/100  03/24/22 120/78  03/05/22 121/74   Pulse Readings from Last 3 Encounters:  03/25/22 97  03/24/22 85  03/05/22 83   Pain Assessment Pain Score: 0-No pain/10  In general this is a tired but otherwise well appearing caucasian female in no acute distress. She's alert and oriented x4 and appropriate throughout the examination. She has ecchymoses over the left orbital region and left frontal region of the face. Cardiopulmonary assessment is negative for acute distress and she exhibits normal effort.      ECOG = 2-3  0 - Asymptomatic (Fully active, able to carry on all predisease activities without restriction)  1 - Symptomatic but completely ambulatory (Restricted in physically strenuous activity but ambulatory and able to carry out work of a light or sedentary nature. For example, light housework,  office work)  2 - Symptomatic, <50% in bed during the day (Ambulatory and capable of all self care but unable to carry out any work activities. Up and about more than 50% of waking hours)  3 - Symptomatic, >50% in bed, but not bedbound (Capable of only limited self-care, confined to bed or chair 50% or more of waking hours)  4 - Bedbound (Completely disabled. Cannot carry on any self-care. Totally confined to bed or chair)  5 - Death   Eustace Pen MM, Creech RH, Tormey DC, et al. [id number]). "Toxicity and response criteria of the [facility]". [place] Oncol. 5 (6): 649-55    LABORATORY DATA:  Lab Results  Component Value Date   WBC 6.2 03/25/2022   HGB 9.1 (L) 03/25/2022   HCT 28.3 (L) 03/25/2022   MCV 120.9 (H) 03/25/2022   PLT 133 (L) 03/25/2022   Lab Results  Component Value Date   NA 134 (L) 03/25/2022   K 3.9 03/25/2022   CL 100 03/25/2022   CO2 25 03/25/2022   Lab Results  Component Value Date   ALT 81 (H) 03/25/2022   AST 181 (H) 03/25/2022   ALKPHOS 118 03/25/2022   BILITOT 1.9 (H) 03/25/2022      RADIOGRAPHY: MR Brain W and Wo Contrast  Result Date: 03/24/2022 CLINICAL DATA:  Initial evaluation for metastatic disease. EXAM: MRI HEAD WITHOUT AND WITH CONTRAST TECHNIQUE: Multiplanar, multiecho pulse sequences of the brain and surrounding structures were obtained without and with intravenous contrast. CONTRAST:  31m GADAVIST GADOBUTROL 1 MMOL/ML IV SOLN COMPARISON:  None available. FINDINGS: Brain: Cerebral volume within normal limits. Irregular dural thickening and enhancement seen overlying the anterior left temporal lobe, consistent with  metastatic disease. Associated leptomeningeal involvement with extension to involve the underlying brain parenchyma (series 11, image 51). Associated localized vasogenic edema at this location without significant regional mass effect. Additional probable dural involvement noted superiorly and anteriorly overlying the frontal lobes bilaterally (series 11, image 77). Patchy dural and leptomeningeal involvement noted along the adjacent anterior falx (series 11, image 76). Few additional small left open in G ule and/or parenchymal metastases noted elsewhere involving both cerebral hemispheres, largest of which seen at the posterior left frontal region and measures 7 mm (series 11, image 110). Few small metastatic deposits noted involving the right cerebellum (series 11, image 27) and right paramedian cerebellar vermis (series 11, image 49). Probable involvement of the right 7/8 nerve root complex at the right IAC (series 11, image 27). No other significant associated edema. No associated hemorrhage. No evidence for acute or subacute infarct. Gray-white matter differentiation otherwise maintained. No other areas of chronic cortical infarction or other insult. No acute or chronic intracranial blood products. Ventricles normal size without hydrocephalus. No extra-axial fluid collection. Pituitary gland suprasellar region within normal limits. Midline structures intact and normally formed. Vascular: Major intracranial vascular flow voids are maintained. Asymmetric FLAIR signal intensity without T1 correlate involving the left transverse sinus noted, likely slow/sluggish flow. Skull and upper cervical spine: Craniocervical junction within normal limits. Bone marrow signal intensity is diffusely heterogeneous without visible focal marrow replacing lesion. Tiny 4 mm nodular FLAIR hyperintensity involving the left frontal scalp noted, indeterminate (series 11, image 34). No other focal scalp lesions. Sinuses/Orbits: Globes and  orbital soft tissues demonstrate no acute finding. Paranasal sinuses are largely clear. No significant mastoid effusion. Other: None. IMPRESSION: 1. Multiple scattered dural, leptomeningeal, and parenchymal intracranial metastasis as detailed above. Associated vasogenic edema at the anterior  left temporal lobe without significant regional mass effect. 2. 4 mm nodular FLAIR hyperintensity involving the left frontal scalp, indeterminate. Correlation with physical exam recommended. 3. No other acute intracranial abnormality. Electronically Signed   By: Jeannine Boga M.D.   On: 03/24/2022 22:54   MR LIVER W WO CONTRAST  Result Date: 03/04/2022 CLINICAL DATA:  Metastatic breast cancer, abnormal liver by prior CT, concern for hepatic metastatic disease, characterize small right lobe liver lesion EXAM: MRI ABDOMEN WITHOUT AND WITH CONTRAST TECHNIQUE: Multiplanar multisequence MR imaging of the abdomen was performed both before and after the administration of intravenous contrast. CONTRAST:  7.5 mL Vueway gadolinium contrast IV COMPARISON:  CT abdomen pelvis, 02/11/2022 FINDINGS: Lower chest: Trace bilateral pleural effusions. Hepatobiliary: Somewhat coarse contour of the liver with very heterogeneous parenchymal enhancement. There is an intrinsically T1 hypointense, T2 intermediate lesion of the anterior liver dome, hepatic segment VIII, measuring 1.0 x 1.0 cm (series 15, image 23). This demonstrates initial rim enhancement and very slow internal enhancement on multiphasic sequences. No gallstones. No biliary ductal dilatation. Pancreas: Fluid signal, nonenhancing cystic lesion of the pancreatic uncinate measuring 1.1 x 0.7 cm. No solid mass, inflammatory changes, or other parenchymal abnormality identified.No pancreatic ductal dilatation. Spleen:  Mild splenomegaly, maximum coronal span 13.3 cm. Adrenals/Urinary Tract: Normal adrenal glands. No renal masses or suspicious contrast enhancement identified. No  evidence of hydronephrosis. Stomach/Bowel: Visualized portions within the abdomen are unremarkable. Vascular/Lymphatic: No pathologically enlarged lymph nodes identified. No abdominal aortic aneurysm demonstrated. Other: Moderate volume ascites throughout the abdomen, slightly increased compared to prior examination. Musculoskeletal: Diffuse, heterogeneous osseous metastatic disease. IMPRESSION: 1. Small lesion of the anterior liver dome, hepatic segment VIII, measuring 1.0 cm, as seen on prior CT. This demonstrates initial rim enhancement and very slow internal enhancement on multiphasic sequences. Although this may reflect a very slowly filling hemangioma, in the setting of known metastatic breast malignancy a demonstrates no definitively benign features and is highly suspicious for a small metastasis. No other liver lesions appreciated. 2. Somewhat coarse contour of the liver with very heterogeneous parenchymal enhancement, as well as ascites and mild splenomegaly, findings in keeping with cirrhosis 3. Moderate volume ascites, slightly increased compared to prior examination. 4. Diffuse, heterogeneous osseous metastatic disease. 5. Fluid signal, nonenhancing cystic lesion of the pancreatic uncinate measuring 1.1 x 0.7 cm. This is consistent with a small pseudocyst or side branch IPMN. No specific further follow-up or characterization is required for this lesion in the setting of known metastatic primary malignancy. Attention on follow-up. Electronically Signed   By: Delanna Ahmadi M.D.   On: 03/04/2022 08:53       IMPRESSION/PLAN: 1. 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 Dr. Lisbeth Renshaw has reviewed her case. She is well known to Dr. Chryl Heck as well. Today we discussed the nature of leptomeningeal disease and pattern of spread through the cerebrospinal fluid. Dr. Lisbeth Renshaw recommends repeating a Total Spine MRI despite a normal study in March of this year. We discussed the  difficulty in treating leptomeningeal disease, and that at a minimum Dr. Lisbeth Renshaw recommends whole brain radiation, but if she has disease in the spine, this could also be treated with palliative radiotherapy. We discussed the possibility of needing craniospinal irradiation but are concerned this might interfere with her ability to proceed with upcoming systemic therapy. She requests an evaluation of her records through the team at [facility] and I recommended that we also include Dr. Mickeal Skinner in her care team who is  a [employer] trained Child psychotherapist who is still very actively working with his colleagues at [employer] on mutual patient cases. She would be interested in clinical trial rather than whole brain radiotherapy if there was an open study. That being said, if there is no trial available, she would be willing to proceed with whole brain radiation in our center. We discussed the risks, benefits, short, and long term effects of radiotherapy, as well as the palliative intent, and the patient is interested in proceeding. I reviewed the delivery and logistics of radiotherapy and that Dr. Lisbeth Renshaw recommends 2  weeks of radiotherapy to the whole brain. Written consent is obtained and placed in the chart, a copy will be provided to the patient. She will simulate in our department tomorrow morning, and I've ordered her MRI of the spine. I will reach out as well to Dr. Mickeal Skinner who was aware of her case. We appreciate in hospitalist team's management of her care. I have adjusted her Dexamethasone to 4 mg TID in the hospital, and sent in a new prescription for this dose to her outpatient pharmacy. With her steroids, I encouraged her to add oral PPI therapy to reduce risks of peptic ulcerative disease.  2. Risks of Cognitive Deficits from Radiotherapy. We discussed the risks related to long term cognitive function when whole brain radiotherapy is administered. We also discussed the use of Namenda in settings of whole brain radiation as it's  been used in the small cell lung cancer population to reduce risks of these cognitive deficits. We feel it is appropriate to consider that data in patients with other tumor histologies and the patient is interested in trying this during therapy after we reviewed the side effect profile of the medication. A new prescription was sent for the titration and a separate for the continuation phase.   In a visit lasting 120 minutes, greater than 50% of the time was spent face to face discussing the patient's condition, in preparation for the discussion, and coordinating the patient's care.      Carola Rhine, [facility]   **Disclaimer: This note was dictated with voice recognition software. Similar sounding words can inadvertently be transcribed and this note may contain transcription errors which may not have been corrected upon publication of note.**

## 2022-03-25 NOTE — Assessment & Plan Note (Signed)
 #)   History of seizures: Single seizure reported 6 days ago, with interval initiation of Keppra.  It appears that this is related to new diagnosis of metastatic disease to the brain associated vasogenic edema, as above.  No evidence of active seizures at this time.  Plan: Continue home [place].  Every 4 hours neurochecks.  Further evaluation and management new diagnosis of metastasis to the brain, as above, including continuation of Decadron.

## 2022-03-25 NOTE — Plan of Care (Signed)
  Problem: Education: Goal: Knowledge of General Education information will improve Description: Including pain rating scale, medication(s)/side effects and non-pharmacologic comfort measures Outcome: Progressing   Problem: Clinical Measurements: Goal: Will remain free from infection Outcome: Progressing   

## 2022-03-25 NOTE — TOC Initial Note (Signed)
Transition of Care [facility]) - Initial/Assessment Note    Patient Details  Name: Brittany [name] MRN: [id number] Date of Birth: 14-Dec-1955  Transition of Care [facility]) CM/SW Contact:    Leeroy Cha, RN Phone Number: 03/25/2022, 8:14 AM  Clinical Narrative:                  Transition of Care [facility]) Screening Note   Patient Details  Name: Brittany [name] Date of Birth: 04-19-1956   Transition of Care [facility]) CM/SW Contact:    Leeroy Cha, RN Phone Number: 03/25/2022, 8:14 AM    Transition of Care Department [facility]) has reviewed patient and no TOC needs have been identified at this time. We will continue to monitor patient advancement through interdisciplinary progression rounds. If new patient transition needs arise, please place a TOC consult.    Expected Discharge Plan: Home/Self Care Barriers to Discharge: Continued Medical Work up   Patient Goals and CMS Choice Patient states their goals for this hospitalization and ongoing recovery are:: to go home CMS Medicare.gov Compare Post Acute Care list provided to:: Patient Choice offered to / list presented to : Patient  Expected Discharge Plan and Services Expected Discharge Plan: Home/Self Care   Discharge Planning Services: CM Consult   Living arrangements for the past 2 months: Single Family Home                                      Prior Living Arrangements/Services Living arrangements for the past 2 months: Single Family Home Lives with:: Spouse Patient language and need for interpreter reviewed:: Yes Do you feel safe going back to the place where you live?: Yes            Criminal Activity/Legal Involvement Pertinent to Current Situation/Hospitalization: No - Comment as needed  Activities of Daily Living Home Assistive Devices/Equipment: Walker (specify type) ADL Screening (condition at time of admission) Patient's cognitive ability adequate to safely complete daily activities?: Yes Is the  patient deaf or have difficulty hearing?: No Does the patient have difficulty seeing, even when wearing glasses/contacts?: No Does the patient have difficulty concentrating, remembering, or making decisions?: No Patient able to express need for assistance with ADLs?: Yes Does the patient have difficulty dressing or bathing?: No Independently performs ADLs?: Yes (appropriate for developmental age) Does the patient have difficulty walking or climbing stairs?: Yes Weakness of Legs: Both Weakness of Arms/Hands: Left  Permission Sought/Granted                  Emotional Assessment Appearance:: Appears stated age     Orientation: : Oriented to Self, Oriented to Place, Oriented to  Time, Oriented to Situation Alcohol / Substance Use: Not Applicable Psych Involvement: No (comment)  Admission diagnosis:  Indigestion [K30] Metastasis to brain ([place]) [C79.31] Other ascites [R18.8] Metastatic malignant neoplasm, unspecified site [facility]) [C79.9] Patient Active Problem List   Diagnosis Date Noted   Chronic anemia 03/25/2022   Metastasis to brain ([place]) 03/24/2022   New onset seizure ([place]) 03/23/2022   Left arm swelling 09/03/2021   Chronic left shoulder pain 02/17/2021   Lumbar radiculopathy 10/07/2020   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   Malignant neoplasm of central portion of left breast in female, estrogen receptor positive ([place]) 11/22/2019   Malignant neoplasm metastatic to bone ([place]) 11/22/2019   Anatomical narrow angle glaucoma 09/25/2018   History of gestational diabetes 09/21/2017  Raynaud phenomenon 09/21/2016   History of total replacement of both hip joints 01/12/2016   Left rotator cuff tear 10/07/2015   Acquired hypothyroidism 09/28/2015   Leukopenia 12/08/2012   Metatarsalgia of both feet 09/21/2011   CERVICALGIA 05/04/2010   Asymptomatic varicose veins 03/17/2009   Osteoarthritis 03/17/2009   SCOLIOSIS, MILD 03/17/2009   History of seizure 03/17/2009   PCP:   Binnie Rail, MD Pharmacy:   [facility] [id number] - [place], [place] - [address] [place] [address] Phone: [phone] Fax: [phone]  [facility] [address], [place] [address] [place] [address] Phone: [phone] Fax: [phone]  [place], [place] [address] [place] [address] Phone: [phone] Fax: [place] [address] Phone: [phone] Fax: [phone]  [place], [place] [address] [place] [address] Phone: [phone] Fax: [phone]     Social Determinants of Health (SDOH) Interventions    Readmission Risk Interventions     View : No data to display.

## 2022-03-25 NOTE — Assessment & Plan Note (Signed)
  #)   Metastasis to the brain: New diagnosis made via MRI brain performed this evening as follow-up for recent new onset seizures/syncope 6 days ago, and associated with evidence of vasogenic edema without associated mass effect, as further detailed above.  EDP discussed patient's case with on-call oncology, who formally consulted and see the patient in the morning, recommending interval Decadron, which she has received.  No evidence of additional seizures or syncope since arrival.  This is in the context of a documented history of metastatic breast cancer.  Plan: Oncology consulted, as above.  Decadron, per oncology recommendations.  Every 4 hour neurochecks.  Repeat CMP and CBC in the morning.  Monitor on symmetry.

## 2022-03-25 NOTE — Consult Note (Addendum)
[place]  [phone] Fax:([phone]) [place]  Reason for Consultation: Stage IV invasive lobular breast cancer, new brain lesions  HPI: Ms. Deeley is a 66 year old female with a past medical history significant for metastatic breast cancer, hypothyroidism, chronic anemia.  The patient presented to the emergency department due to seizures.  The patient was traveling to [place] recently and was found by her husband on the floor the bathroom during the trip.  She was admitted to the hospital in [place] and started on Keppra.  According to the patient's husband, she had a CT of the head performed there which did show some abnormalities concerning for metastatic lesions.  The patient was stabilized and seizure-free for at least 48 hours and was deemed stable to travel back to the [place] for further follow-up of her metastatic breast cancer.  She came to the emergency department for further work-up.  MRI of the brain was performed which showed multiple scattered dural, leptomeningeal, and parenchymal intracranial metastasis with associated vasogenic edema in the anterior left temporal lobe without significant regional mass effect.  There was a 4 mm nodular FLAIR hyperintensity involving the left frontal scalp which was indeterminate.  This morning, the patient reports that she has not had any recurrent seizures.  He reports a mild headache.  She has had a poor appetite but her weight is up by about 20 pounds related to edema. He is not having any chest pain or shortness of breath.  She reports abdominal distention and lower extremity edema which she thinks is getting worse.  No bleeding reported.  Husband has not noticed much in terms of confusion or cognitive impairment.  Medical oncology was asked see the patient make recommendations regarding her metastatic breast cancer and new brain lesions.  Past Medical History:  Diagnosis Date   ANEMIA-NOS     Asymptomatic varicose veins    Bone metastases 2021   Breast cancer ([facility]) 1996   LEFT BREAST CA   BREAST CANCER, HX OF 01/1995 dx   s/p Lumpectomy, XRT, chemo and 71yrarmidex   Complication of anesthesia    Gestational diabetes mellitus in childbirth, diet controlled 1985   Hypothyroid 09/28/2015   Dx 09/2015   Leukopenia    mild since chemo   OSTEOARTHRITIS    Personal history of chemotherapy    Personal history of radiation therapy    SCOLIOSIS, MILD   :   Past Surgical History:  Procedure Laterality Date   BREAST BIOPSY     BREAST LUMPECTOMY Left    1996   HIP CLOSED REDUCTION N/A 02/12/2022   Procedure: CLOSED MANIPULATION HIP;  Surgeon: CTania Ade MD;  Location: [facility];  Service: Orthopedics;  Laterality: N/A;   left lumpectomy  1Yell    2002 & 2003-Dr. vail @ [facility] :   Current Facility-Administered Medications  Medication Dose Route Frequency Provider Last Rate Last Admin   0.9 %  sodium chloride infusion   Intravenous Continuous WDaleen Bo MD 125 mL/hr at 03/25/22 0752 New Bag at 03/25/22 0752   acetaminophen (TYLENOL) tablet 650 mg  650 mg Oral Q6H PRN Howerter, Justin B, DO       Or   acetaminophen (TYLENOL) suppository 650 mg  650 mg Rectal Q6H PRN Howerter, Justin B, DO       dexamethasone (DECADRON) injection 10 mg  10 mg Intravenous Q24H Howerter, Justin B, DO  10 mg at 03/25/22 0749   levETIRAcetam (KEPPRA) tablet 500 mg  500 mg Oral BID Howerter, Justin B, DO   500 mg at 03/25/22 0900   levothyroxine (SYNTHROID) tablet 50 mcg  50 mcg Oral Q0600 Howerter, Justin B, DO   50 mcg at 03/25/22 0749   ondansetron (ZOFRAN) injection 4 mg  4 mg Intravenous Q6H PRN Howerter, Justin B, DO         No Known Allergies:   Family History  Problem Relation Age of Onset   Heart disease Father 26       AMI   Hyperlipidemia Father    Valvular heart disease Father 59   Parkinson's disease Father 60   Arthritis  Mother    Breast cancer Mother 48   Hyperlipidemia Mother    Osteoporosis Mother    Pancreatic cancer Maternal Grandmother 73       presumed dx   Diabetes Paternal Grandfather        type 2   Breast cancer Maternal Aunt   :   Social History   Socioeconomic History   Marital status: Married    Spouse name: Not on file   Number of children: Not on file   Years of education: Not on file   Highest education level: Not on file  Occupational History   Not on file  Tobacco Use   Smoking status: Former    Types: Cigarettes    Quit date: 06/25/1977    Years since quitting: 44.7   Smokeless tobacco: Never  Vaping Use   Vaping Use: Never used  Substance and Sexual Activity   Alcohol use: Yes    Alcohol/week: 3.0 - 4.0 standard drinks    Types: 3 - 4 Glasses of wine per week   Drug use: No   Sexual activity: Not on file  Other Topics Concern   Not on file  Social History Narrative   Married-lives with spouse & son. Art gallery manager, enjoys golf, sports, bridge, swim, and walking dog   Social Determinants of Health   Financial Resource Strain: Low Risk    Difficulty of Paying Living Expenses: Not hard at all  Food Insecurity: No Food Insecurity   Worried About Charity fundraiser in the Last Year: Never true   Arboriculturist in the Last Year: Never true  Transportation Needs: No Transportation Needs   Lack of Transportation (Medical): No   Lack of Transportation (Non-Medical): No  Physical Activity: Sufficiently Active   Days of Exercise per Week: 7 days   Minutes of Exercise per Session: 60 min  Stress: No Stress Concern Present   Feeling of Stress : Not at all  Social Connections: Socially Integrated   Frequency of Communication with Friends and Family: Three times a week   Frequency of Social Gatherings with Friends and Family: Three times a week   Attends Religious Services: More than 4 times per year   Active Member of Clubs or Organizations: Yes   Attends Programme researcher, broadcasting/film/video: More than 4 times per year   Marital Status: Married  Human resources officer Violence: Not At Risk   Fear of Current or Ex-Partner: No   Emotionally Abused: No   Physically Abused: No   Sexually Abused: No  :  Review of Systems: A comprehensive 14 point review of systems was negative except as noted in the HPI.  Exam: Patient Vitals for the past 24 hrs:  BP Temp Temp src Pulse Resp SpO2  Height Weight  03/25/22 1023 (!) 154/100 -- -- 97 20 94 % -- --  03/25/22 1022 -- 98.3 F (36.8 C) Oral -- -- -- -- --  03/25/22 0659 (!) 145/69 97.9 F (36.6 C) -- 89 18 93 % -- --  03/25/22 0214 (!) 143/71 98.5 F (36.9 C) Oral 79 18 97 % '5\' 7"'  (1.702 m) 76.6 kg  03/25/22 0115 127/88 97.6 F (36.4 C) -- 81 16 94 % -- --  03/24/22 2351 127/63 -- -- 84 16 94 % -- --  03/24/22 2030 137/73 -- -- 94 19 90 % -- --  03/24/22 1915 (!) 145/71 -- -- 80 15 (!) 86 % -- --  03/24/22 1900 132/74 -- -- 80 12 92 % -- --  03/24/22 1845 136/76 -- -- 77 14 92 % -- --  03/24/22 1830 136/81 -- -- 79 12 92 % -- --  03/24/22 1815 132/75 -- -- 82 17 91 % -- --  03/24/22 1800 138/80 -- -- 78 17 92 % -- --  03/24/22 1640 130/78 -- -- 79 18 91 % -- --  03/24/22 1552 140/86 98.2 F (36.8 C) Oral 87 16 92 % -- --    General: Laying in bed, no distress. Eyes:  no scleral icterus.   ENT:  There were no oropharyngeal lesions.   Lymphatics:  Negative cervical, supraclavicular or axillary adenopathy.   Respiratory: lungs were clear bilaterally without wheezing or crackles.   Cardiovascular:  Regular rate and rhythm, S1/S2, without murmur, rub or gallop.  Pitting lower extremity edema GI: Abdomen distended, ascites present.     Skin: Old ecchymosis around her left eye. Neuro exam was nonfocal. Patient was alert and oriented.    Lab Results  Component Value Date   WBC 6.2 03/25/2022   HGB 9.1 (L) 03/25/2022   HCT 28.3 (L) 03/25/2022   PLT 133 (L) 03/25/2022   GLUCOSE 109 (H) 03/25/2022   CHOL 170  09/28/2019   TRIG 44.0 09/28/2019   HDL 81.10 09/28/2019   LDLCALC 80 09/28/2019   ALT 81 (H) 03/25/2022   AST 181 (H) 03/25/2022   NA 134 (L) 03/25/2022   K 3.9 03/25/2022   CL 100 03/25/2022   CREATININE 0.78 03/25/2022   BUN 26 (H) 03/25/2022   CO2 25 03/25/2022    MR Brain W and Wo Contrast  Result Date: 03/24/2022 CLINICAL DATA:  Initial evaluation for metastatic disease. EXAM: MRI HEAD WITHOUT AND WITH CONTRAST TECHNIQUE: Multiplanar, multiecho pulse sequences of the brain and surrounding structures were obtained without and with intravenous contrast. CONTRAST:  22m GADAVIST GADOBUTROL 1 MMOL/ML IV SOLN COMPARISON:  None available. FINDINGS: Brain: Cerebral volume within normal limits. Irregular dural thickening and enhancement seen overlying the anterior left temporal lobe, consistent with metastatic disease. Associated leptomeningeal involvement with extension to involve the underlying brain parenchyma (series 11, image 51). Associated localized vasogenic edema at this location without significant regional mass effect. Additional probable dural involvement noted superiorly and anteriorly overlying the frontal lobes bilaterally (series 11, image 77). Patchy dural and leptomeningeal involvement noted along the adjacent anterior falx (series 11, image 76). Few additional small left open in G ule and/or parenchymal metastases noted elsewhere involving both cerebral hemispheres, largest of which seen at the posterior left frontal region and measures 7 mm (series 11, image 110). Few small metastatic deposits noted involving the right cerebellum (series 11, image 27) and right paramedian cerebellar vermis (series 11, image 49). Probable involvement of the right 7/8  nerve root complex at the right IAC (series 11, image 27). No other significant associated edema. No associated hemorrhage. No evidence for acute or subacute infarct. Gray-white matter differentiation otherwise maintained. No other areas  of chronic cortical infarction or other insult. No acute or chronic intracranial blood products. Ventricles normal size without hydrocephalus. No extra-axial fluid collection. Pituitary gland suprasellar region within normal limits. Midline structures intact and normally formed. Vascular: Major intracranial vascular flow voids are maintained. Asymmetric FLAIR signal intensity without T1 correlate involving the left transverse sinus noted, likely slow/sluggish flow. Skull and upper cervical spine: Craniocervical junction within normal limits. Bone marrow signal intensity is diffusely heterogeneous without visible focal marrow replacing lesion. Tiny 4 mm nodular FLAIR hyperintensity involving the left frontal scalp noted, indeterminate (series 11, image 34). No other focal scalp lesions. Sinuses/Orbits: Globes and orbital soft tissues demonstrate no acute finding. Paranasal sinuses are largely clear. No significant mastoid effusion. Other: None. IMPRESSION: 1. Multiple scattered dural, leptomeningeal, and parenchymal intracranial metastasis as detailed above. Associated vasogenic edema at the anterior left temporal lobe without significant regional mass effect. 2. 4 mm nodular FLAIR hyperintensity involving the left frontal scalp, indeterminate. Correlation with physical exam recommended. 3. No other acute intracranial abnormality. Electronically Signed   By: Jeannine Boga M.D.   On: 03/24/2022 22:54   MR LIVER W WO CONTRAST  Result Date: 03/04/2022 CLINICAL DATA:  Metastatic breast cancer, abnormal liver by prior CT, concern for hepatic metastatic disease, characterize small right lobe liver lesion EXAM: MRI ABDOMEN WITHOUT AND WITH CONTRAST TECHNIQUE: Multiplanar multisequence MR imaging of the abdomen was performed both before and after the administration of intravenous contrast. CONTRAST:  7.5 mL Vueway gadolinium contrast IV COMPARISON:  CT abdomen pelvis, 02/11/2022 FINDINGS: Lower chest: Trace  bilateral pleural effusions. Hepatobiliary: Somewhat coarse contour of the liver with very heterogeneous parenchymal enhancement. There is an intrinsically T1 hypointense, T2 intermediate lesion of the anterior liver dome, hepatic segment VIII, measuring 1.0 x 1.0 cm (series 15, image 23). This demonstrates initial rim enhancement and very slow internal enhancement on multiphasic sequences. No gallstones. No biliary ductal dilatation. Pancreas: Fluid signal, nonenhancing cystic lesion of the pancreatic uncinate measuring 1.1 x 0.7 cm. No solid mass, inflammatory changes, or other parenchymal abnormality identified.No pancreatic ductal dilatation. Spleen:  Mild splenomegaly, maximum coronal span 13.3 cm. Adrenals/Urinary Tract: Normal adrenal glands. No renal masses or suspicious contrast enhancement identified. No evidence of hydronephrosis. Stomach/Bowel: Visualized portions within the abdomen are unremarkable. Vascular/Lymphatic: No pathologically enlarged lymph nodes identified. No abdominal aortic aneurysm demonstrated. Other: Moderate volume ascites throughout the abdomen, slightly increased compared to prior examination. Musculoskeletal: Diffuse, heterogeneous osseous metastatic disease. IMPRESSION: 1. Small lesion of the anterior liver dome, hepatic segment VIII, measuring 1.0 cm, as seen on prior CT. This demonstrates initial rim enhancement and very slow internal enhancement on multiphasic sequences. Although this may reflect a very slowly filling hemangioma, in the setting of known metastatic breast malignancy a demonstrates no definitively benign features and is highly suspicious for a small metastasis. No other liver lesions appreciated. 2. Somewhat coarse contour of the liver with very heterogeneous parenchymal enhancement, as well as ascites and mild splenomegaly, findings in keeping with cirrhosis 3. Moderate volume ascites, slightly increased compared to prior examination. 4. Diffuse, heterogeneous  osseous metastatic disease. 5. Fluid signal, nonenhancing cystic lesion of the pancreatic uncinate measuring 1.1 x 0.7 cm. This is consistent with a small pseudocyst or side branch IPMN. No specific further follow-up or characterization is required  for this lesion in the setting of known metastatic primary malignancy. Attention on follow-up. Electronically Signed   By: Delanna Ahmadi M.D.   On: 03/04/2022 08:53     MR Brain W and Wo Contrast  Result Date: 03/24/2022 CLINICAL DATA:  Initial evaluation for metastatic disease. EXAM: MRI HEAD WITHOUT AND WITH CONTRAST TECHNIQUE: Multiplanar, multiecho pulse sequences of the brain and surrounding structures were obtained without and with intravenous contrast. CONTRAST:  78m GADAVIST GADOBUTROL 1 MMOL/ML IV SOLN COMPARISON:  None available. FINDINGS: Brain: Cerebral volume within normal limits. Irregular dural thickening and enhancement seen overlying the anterior left temporal lobe, consistent with metastatic disease. Associated leptomeningeal involvement with extension to involve the underlying brain parenchyma (series 11, image 51). Associated localized vasogenic edema at this location without significant regional mass effect. Additional probable dural involvement noted superiorly and anteriorly overlying the frontal lobes bilaterally (series 11, image 77). Patchy dural and leptomeningeal involvement noted along the adjacent anterior falx (series 11, image 76). Few additional small left open in G ule and/or parenchymal metastases noted elsewhere involving both cerebral hemispheres, largest of which seen at the posterior left frontal region and measures 7 mm (series 11, image 110). Few small metastatic deposits noted involving the right cerebellum (series 11, image 27) and right paramedian cerebellar vermis (series 11, image 49). Probable involvement of the right 7/8 nerve root complex at the right IAC (series 11, image 27). No other significant associated edema. No  associated hemorrhage. No evidence for acute or subacute infarct. Gray-white matter differentiation otherwise maintained. No other areas of chronic cortical infarction or other insult. No acute or chronic intracranial blood products. Ventricles normal size without hydrocephalus. No extra-axial fluid collection. Pituitary gland suprasellar region within normal limits. Midline structures intact and normally formed. Vascular: Major intracranial vascular flow voids are maintained. Asymmetric FLAIR signal intensity without T1 correlate involving the left transverse sinus noted, likely slow/sluggish flow. Skull and upper cervical spine: Craniocervical junction within normal limits. Bone marrow signal intensity is diffusely heterogeneous without visible focal marrow replacing lesion. Tiny 4 mm nodular FLAIR hyperintensity involving the left frontal scalp noted, indeterminate (series 11, image 34). No other focal scalp lesions. Sinuses/Orbits: Globes and orbital soft tissues demonstrate no acute finding. Paranasal sinuses are largely clear. No significant mastoid effusion. Other: None. IMPRESSION: 1. Multiple scattered dural, leptomeningeal, and parenchymal intracranial metastasis as detailed above. Associated vasogenic edema at the anterior left temporal lobe without significant regional mass effect. 2. 4 mm nodular FLAIR hyperintensity involving the left frontal scalp, indeterminate. Correlation with physical exam recommended. 3. No other acute intracranial abnormality. Electronically Signed   By: BJeannine BogaM.D.   On: 03/24/2022 22:54   MR LIVER W WO CONTRAST  Result Date: 03/04/2022 CLINICAL DATA:  Metastatic breast cancer, abnormal liver by prior CT, concern for hepatic metastatic disease, characterize small right lobe liver lesion EXAM: MRI ABDOMEN WITHOUT AND WITH CONTRAST TECHNIQUE: Multiplanar multisequence MR imaging of the abdomen was performed both before and after the administration of intravenous  contrast. CONTRAST:  7.5 mL Vueway gadolinium contrast IV COMPARISON:  CT abdomen pelvis, 02/11/2022 FINDINGS: Lower chest: Trace bilateral pleural effusions. Hepatobiliary: Somewhat coarse contour of the liver with very heterogeneous parenchymal enhancement. There is an intrinsically T1 hypointense, T2 intermediate lesion of the anterior liver dome, hepatic segment VIII, measuring 1.0 x 1.0 cm (series 15, image 23). This demonstrates initial rim enhancement and very slow internal enhancement on multiphasic sequences. No gallstones. No biliary ductal dilatation. Pancreas: Fluid signal, nonenhancing  cystic lesion of the pancreatic uncinate measuring 1.1 x 0.7 cm. No solid mass, inflammatory changes, or other parenchymal abnormality identified.No pancreatic ductal dilatation. Spleen:  Mild splenomegaly, maximum coronal span 13.3 cm. Adrenals/Urinary Tract: Normal adrenal glands. No renal masses or suspicious contrast enhancement identified. No evidence of hydronephrosis. Stomach/Bowel: Visualized portions within the abdomen are unremarkable. Vascular/Lymphatic: No pathologically enlarged lymph nodes identified. No abdominal aortic aneurysm demonstrated. Other: Moderate volume ascites throughout the abdomen, slightly increased compared to prior examination. Musculoskeletal: Diffuse, heterogeneous osseous metastatic disease. IMPRESSION: 1. Small lesion of the anterior liver dome, hepatic segment VIII, measuring 1.0 cm, as seen on prior CT. This demonstrates initial rim enhancement and very slow internal enhancement on multiphasic sequences. Although this may reflect a very slowly filling hemangioma, in the setting of known metastatic breast malignancy a demonstrates no definitively benign features and is highly suspicious for a small metastasis. No other liver lesions appreciated. 2. Somewhat coarse contour of the liver with very heterogeneous parenchymal enhancement, as well as ascites and mild splenomegaly, findings  in keeping with cirrhosis 3. Moderate volume ascites, slightly increased compared to prior examination. 4. Diffuse, heterogeneous osseous metastatic disease. 5. Fluid signal, nonenhancing cystic lesion of the pancreatic uncinate measuring 1.1 x 0.7 cm. This is consistent with a small pseudocyst or side branch IPMN. No specific further follow-up or characterization is required for this lesion in the setting of known metastatic primary malignancy. Attention on follow-up. Electronically Signed   By: Delanna Ahmadi M.D.   On: 03/04/2022 08:53    Assessment and Plan:   66 y.o. [place] woman status post left lumpectomy 1996 for a T1 N1, anatomic stage II invasive lobular breast cancer, estrogen receptor positive             (a) s/p adjuvant chemotherapy with doxorubicin and cyclophosphamide x4             (b) status post adjuvant radiation             (c) status post tamoxifen for 5+ years   (1) benign leukopenia: noted 2011, felt secondary to prior chemotherapy   (2) genetics testing 06/23/2018 through [facility] panel found no deleterious mutations in the 84 genes tested including BRCA 1-2             (a) 2 variants of uncertain significance were fund in [place] [c.1037C>T (p.Ser346Phe) and c.503C>G (p.ALA168Gly)   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, 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 (3) staging studies:             (a) breast MRI 10/30/2019 shows sternal mottling             (b) bone scan 11/19/2019 shows multiple areas of uptake suspicious for metastatic disease             (c) CT of the chest abdomen and pelvis 11/28/2019 shows no visceral disease             (d) tumor markers 11/23/2019 shows a CEA of 18.11, CA 27-29 of 61.2, CA 125 of 21.1             (e) F-18-estradiol PET (cerianna) 01/11/2020 shows multiple sclerotic bone lesions which do not accumulate the estrogen receptor specific radiotracer             (f) bone marrow biopsy 01/25/2020 confirms metastatic breast cancer, gross cystic  disease fluid protein positive, but estrogen and progesterone receptor negative.  HER-2/neu was 2+ on immunohistochemistry, negative by FISH (1.35/1.70).             (  g) CARIS confirms triple negative disease, finds a positive androgen receptor at 3+, 70%; and the PIK3CA was indeterminate.  PTEN was positive by IHC at 90%.  PD-L1 was negative.  Genomic LOH was low and MSI was stable with proficient mismatch mismatch repair.  BRCA 1 and 2 were negative.   (4) denosumab/Xgeva started 12/13/2019, repeated monthly   (5) capecitabine started 02/25/2020, with significant plantar dysesthesia developing after 1 week, drug held 03/12/2020             (a) capecitabine restarted 03/25/2020 at 1000 mg in the morning only.             (b) bone scan 07/23/2020 shows no evidence of disease progression              (c) MRI of the total spine and pelvis 11/01/2020 shows multiple bone lesions but no epidural encroachment, no compression fracture and no evidence for lymphangitic spread of disease; there is significant degenerative disease             (d) repeat bone scan 01/13/2021 difficult to interpret, no definitive progression             (e) capecitabine was held as of 01/30/2021 with significant palmar plantar erythrodysesthesia             (f) capecitabine resumed 03/10/2021 at a lower dose (1 g every other day).             (g) dose increased to 1 g daily Monday through Friday, off on weekends as of 04/28/2021 (h) capecitabine held after 09/16/2021 to allow time for PPED to recover (i) other options include CMF, sacituzumab govitecan, bicalutamide     Mar 02, 2022 MRI liver to assess the status of metastatic disease showed a small lesion on the anterior liver dome measuring 1 cm, not clear if this is a slowly filling hemangioma versus metastatic malignancy.  No other liver lesions.  Metastatic bone disease.  Moderate volume ascites slightly increased, possible IPMN or pseudocyst in the pancreatic uncinate  process.  She also has changes in the liver contour suggestive of cirrhosis  03/24/2022-new brain metastases.  Presented with seizures.   PLAN: Ms. Siegfried developed seizures while traveling in [place].  She was started on Keppra and dexamethasone.  Imaging at that time concerning for metastatic brain lesions.  She was stabilized and the patient traveled back to the [place] for further oncologic work-up.  MRI of the brain confirmed brain metastases.  Discussed imaging findings with the patient and her husband.  Recommend continuation of Keppra and dexamethasone.  I have reached out to radiation oncology for review of her imaging to see if whole brain radiation is an option.  We will see her in consultation. Dexamethasone dosing can be adjusted per radiation oncology.  She has underlying cirrhosis.  She was trialed on low-dose Lasix during her travels and plan was to have GI evaluation for management of her cirrhosis when she returned.  Please consult GI for management of cirrhosis.  May need paracentesis.  We will have to reevaluate her as an outpatient to see what her performance status is to determine systemic treatment options.  CMF could be considered as of treatment.  Mikey Bussing, DNP, AGPCNP-BC, AOCNP   Attending Note  I personally saw the patient, reviewed the chart and examined the patient. The plan of care was discussed with the patient and the admitting team. I agree with the assessment and plan as documented above. Thank you  very much for the consultation.  I met the patient and her husband today.  We have discussed about the MRI imaging which suggested scattered dural, leptomeningeal and parenchymal intracranial metastasis.  Patient was completely asymptomatic until the episode of seizure happened while she was traveling in [place].  She had MRI of the spine back in March which did not show any evidence of dural metastasis. We have discussed that unfortunately leptomeningeal disease  confers poor prognosis and treatment might consist of whole brain radiation as well as craniospinal irradiation followed by consideration for systemic therapy once craniospinal disease is stabilized. There is no role for surgery at this time.  I also agree with restaging CT chest abdomen pelvis. Agree with consult to Dr. Mickeal Skinner who is our neuro oncologist. She should continue dexamethasone 4 mg thrice a day or as recommended by radiation oncology. Husband had a few questions outside the patient's room and have clearly explained that without treatment prognosis could be very poor and overall survival could be in the order of several weeks.   With treatment overall survival could extend to several months but overall prognosis still remains poor and the intent of treatment is not curative.  He has expressed that she would be interested in clinical trials or consult with [facility]. We will arrange for follow-up with Dr. Mickeal Skinner who is our [place] trained neuro oncologist as well.  If no systemic disease, then we will wait for completion for radiation, then consider systemic therapy, likely sacituzumab although the trial only enrolled treated cranial mets.  I spent a total of 70 plus minutes in the care of this patient including history and physical exam, counseling and coordination of care.

## 2022-03-26 ENCOUNTER — Inpatient Hospital Stay (HOSPITAL_COMMUNITY): Payer: Medicare Other

## 2022-03-26 ENCOUNTER — Ambulatory Visit
Admission: RE | Admit: 2022-03-26 | Discharge: 2022-03-26 | Disposition: A | Discharge status: Home / Self Care | Payer: Medicare Other | Source: Ambulatory Visit | Attending: Radiation Oncology | Admitting: Radiation Oncology

## 2022-03-26 DIAGNOSIS — C7931 Secondary malignant neoplasm of brain: Secondary | ICD-10-CM | POA: Insufficient documentation

## 2022-03-26 DIAGNOSIS — C50112 Malignant neoplasm of central portion of left female breast: Secondary | ICD-10-CM | POA: Insufficient documentation

## 2022-03-26 DIAGNOSIS — Z170 Estrogen receptor positive status [ER+]: Secondary | ICD-10-CM | POA: Insufficient documentation

## 2022-03-26 LAB — COMPREHENSIVE METABOLIC PANEL
ALT: 81 U/L — ABNORMAL HIGH (ref 0–44)
AST: 174 U/L — ABNORMAL HIGH (ref 15–41)
Albumin: 2.6 g/dL — ABNORMAL LOW (ref 3.5–5.0)
Alkaline Phosphatase: 111 U/L (ref 38–126)
Anion gap: 5 (ref 5–15)
BUN: 23 mg/dL (ref 8–23)
CO2: 27 mmol/L (ref 22–32)
Calcium: 7.4 mg/dL — ABNORMAL LOW (ref 8.9–10.3)
Chloride: 100 mmol/L (ref 98–111)
Creatinine, Ser: 0.71 mg/dL (ref 0.44–1.00)
GFR, Estimated: >60 mL/min (ref >60)
Glucose, Bld: 133 mg/dL — ABNORMAL HIGH (ref 70–99)
Potassium: 4 mmol/L (ref 3.5–5.1)
Sodium: 132 mmol/L — ABNORMAL LOW (ref 135–145)
Total Bilirubin: 2 mg/dL — ABNORMAL HIGH (ref 0.3–1.2)
Total Protein: 5.6 g/dL — ABNORMAL LOW (ref 6.5–8.1)

## 2022-03-26 LAB — PROTEIN, PLEURAL OR PERITONEAL FLUID: Total protein, fluid: <3 g/dL

## 2022-03-26 LAB — LACTATE DEHYDROGENASE, PLEURAL OR PERITONEAL FLUID: LD, Fluid: 54 U/L — ABNORMAL HIGH (ref 3–23)

## 2022-03-26 LAB — CBC
HCT: 29.4 % — ABNORMAL LOW (ref 36.0–46.0)
Hemoglobin: 9.6 g/dL — ABNORMAL LOW (ref 12.0–15.0)
MCH: 39 pg — ABNORMAL HIGH (ref 26.0–34.0)
MCHC: 32.7 g/dL (ref 30.0–36.0)
MCV: 119.5 fL — ABNORMAL HIGH (ref 80.0–100.0)
Platelets: 117 10*3/uL — ABNORMAL LOW (ref 150–400)
RBC: 2.46 MIL/uL — ABNORMAL LOW (ref 3.87–5.11)
RDW: 18.1 % — ABNORMAL HIGH (ref 11.5–15.5)
WBC: 8.8 10*3/uL (ref 4.0–10.5)
nRBC: 18.9 % — ABNORMAL HIGH (ref 0.0–0.2)

## 2022-03-26 LAB — BODY FLUID CELL COUNT WITH DIFFERENTIAL
Eos, Fluid: 0 %
Lymphs, Fluid: 58 %
Monocyte-Macrophage-Serous Fluid: 38 % — ABNORMAL LOW (ref 50–90)
Neutrophil Count, Fluid: 4 % (ref 0–25)
Total Nucleated Cell Count, Fluid: 168 cu mm (ref 0–1000)

## 2022-03-26 LAB — FERRITIN: Ferritin: 3106 ng/mL — ABNORMAL HIGH (ref 11–307)

## 2022-03-26 LAB — HEPATITIS PANEL, ACUTE
HCV Ab: NONREACTIVE
Hep A IgM: NONREACTIVE
Hep B C IgM: NONREACTIVE
Hepatitis B Surface Ag: NONREACTIVE

## 2022-03-26 MED ORDER — LIDOCAINE HCL 1 % IJ SOLN
INTRAMUSCULAR | Status: AC
  Administered 2022-03-26: 10 mL
  Filled 2022-03-26: qty 20

## 2022-03-26 MED ORDER — GADOBUTROL 1 MMOL/ML IV SOLN
7.0000 mL | Freq: Once | INTRAVENOUS | Status: AC | PRN
  Administered 2022-03-26: 7 mL via INTRAVENOUS

## 2022-03-26 MED ORDER — PANTOPRAZOLE SODIUM 40 MG PO TBEC
40.0000 mg | DELAYED_RELEASE_TABLET | Freq: Every day | ORAL | Status: DC
  Administered 2022-03-26: 40 mg via ORAL
  Filled 2022-03-26: qty 1

## 2022-03-26 MED ORDER — PANTOPRAZOLE SODIUM 40 MG PO TBEC: 40.0000 mg | DELAYED_RELEASE_TABLET | Freq: Every day | ORAL | 0 refills | Status: AC

## 2022-03-26 MED ORDER — ALUM & MAG HYDROXIDE-SIMETH 200-200-20 MG/5ML PO SUSP: 30.0000 mL | Freq: Four times a day (QID) | ORAL | Status: DC | PRN

## 2022-03-26 MED ORDER — FUROSEMIDE 40 MG PO TABS
40.0000 mg | ORAL_TABLET | Freq: Every day | ORAL | Status: DC
  Administered 2022-03-26: 40 mg via ORAL
  Filled 2022-03-26: qty 1

## 2022-03-26 MED ORDER — SPIRONOLACTONE 25 MG PO TABS
100.0000 mg | ORAL_TABLET | Freq: Every day | ORAL | Status: DC
  Administered 2022-03-26: 100 mg via ORAL
  Filled 2022-03-26: qty 4

## 2022-03-26 NOTE — Progress Notes (Signed)
D/w Dr. Mickeal Skinner, he will see her as an outpt next week for seizure management. He recommended Dr. Verne Spurr, MD at [facility] for a second opinion. I've emailed her and her research team lead to see if they would offer anything different. Her MRI spine was negative for LMD in the canal/cord, and I discussed this with her husband so we would only plan whole brain radiation. Her CT scan did show inflammatory changes in the lung, and ascites. She will have a paracentesis as well. I'll follow up with Mr. Jocelyn once we know recommendations from [facility], otherwise we will plan for her to start whole brain radiation as an outpatient on Monday 03/29/22.    Carola Rhine, PAC

## 2022-03-26 NOTE — Procedures (Signed)
Ultrasound-guided diagnostic and therapeutic paracentesis performed yielding 4.7 liters of yellow fluid. No immediate complications. A portion of the fluid was sent to the lab for preordered studies. EBL none.

## 2022-03-26 NOTE — Progress Notes (Signed)
PROGRESS NOTE    Brittany [name]  UMP:[id number] DOB: [birth year] DOA: 03/24/2022 PCP: Binnie Rail, MD    Brief Narrative:  Brittany [name] is a 66 y.o. 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 Mickeal Skinner, he will see her as an outpt next week for seizure management. He recommended Dr. Verne Spurr, MD at [facility] for a second opinion. I've emailed her and her research team lead to see if they would offer anything different. Her MRI spine was negative for LMD in the canal/cord, and I discussed this with her husband so we would only plan whole brain radiation. Her CT scan did show inflammatory changes in the lung, and ascites. She will have a paracentesis as well. I'll follow up with Mr. Milner once we know recommendations from [facility], otherwise we will plan for her to start whole brain radiation as an outpatient on Monday 03/29/22 -seizure precautions   Chronic anemia -daily labs   Acquired hypothyroidism - cont home Synthroid.    Elevated LFTs- known liver cirrhosis with ascites -lasix/aldactone when able IR for paracentesis -GI consult    DVT prophylaxis: SCDs Start: 03/25/22 0000    Code Status: Full Code   Disposition Plan:  Level of care: Med-Surg Status is: Inpatient Remains inpatient appropriate because: needs further work up/treatment    Consultants:  Oncology GI Rad onc    Subjective: Getting GI consult today as well as paracentesis-- never  had before  Objective: Vitals:   03/25/22 1955 03/26/22 0500 03/26/22 1156 03/26/22 1212  BP: 139/74  140/88 129/74  Pulse: 86     Resp: 14     Temp: 97.7 F (36.5 C)     TempSrc: Oral     SpO2: 93%     Weight:  76.9 kg    Height:        Intake/Output Summary (Last 24 hours) at 03/26/2022 1224 Last data filed at 03/26/2022 0602 Gross per 24 hour  Intake 723.58 ml  Output --  Net 723.58 ml   Filed Weights   03/25/22 0214 03/26/22 0500  Weight: 76.6 kg 76.9 kg    Examination:   General: Appearance:     Overweight female in no acute distress     Lungs:      respirations unlabored  Heart:    Normal heart rate.   MS:   All extremities are intact.   Neurologic:   Awake, alert      Data Reviewed: I have personally reviewed following labs and imaging studies  CBC: Recent Labs  Lab 03/24/22 2057 03/25/22 0423 03/26/22 0405  WBC 6.8 6.2 8.8  NEUTROABS 4.2 4.0  --   HGB 9.3* 9.1* 9.6*  HCT 28.9* 28.3* 29.4*  MCV 119.4* 120.9* 119.5*  PLT 136* 133* 400*   Basic Metabolic Panel: Recent Labs  Lab 03/24/22 0040 03/24/22 2057 03/25/22 0423 03/26/22 0405  NA  --  132* 134* 132*  K  --  3.8 3.9 4.0  CL  --  98 100 100  CO2  --  26 25  27  GLUCOSE  --  114* 109* 133*  BUN  --  25* 26* 23  CREATININE  --  0.60 0.78 0.71  CALCIUM  --  8.1* 8.2* 7.4*  MG 2.1  --  2.3  --    GFR: Estimated Creatinine Clearance: 72.9 mL/min (by C-G formula based on SCr of 0.71 mg/dL). Liver Function Tests: Recent Labs  Lab 03/24/22 2057 03/25/22 0423 03/26/22 0405  AST 179* 181* 174*  ALT 79* 81* 81*  ALKPHOS 117 118 111  BILITOT 1.8* 1.9* 2.0*  PROT 5.8* 6.0* 5.6*  ALBUMIN 2.8* 2.8* 2.6*   No results for input(s): LIPASE, AMYLASE in the last 168 hours. No results for input(s): AMMONIA in the last 168 hours. Coagulation Profile: Recent Labs  Lab 03/24/22 2057  INR 1.5*   Cardiac Enzymes: No results for input(s): CKTOTAL, CKMB, CKMBINDEX, TROPONINI in the last 168  hours. BNP (last 3 results) No results for input(s): PROBNP in the last 8760 hours. HbA1C: No results for input(s): HGBA1C in the last 72 hours. CBG: No results for input(s): GLUCAP in the last 168 hours. Lipid Profile: No results for input(s): CHOL, HDL, LDLCALC, TRIG, CHOLHDL, LDLDIRECT in the last 72 hours. Thyroid Function Tests: No results for input(s): TSH, T4TOTAL, FREET4, T3FREE, THYROIDAB in the last 72 hours. Anemia Panel: No results for input(s): VITAMINB12, FOLATE, FERRITIN, TIBC, IRON, RETICCTPCT in the last 72 hours. Sepsis Labs: No results for input(s): PROCALCITON, LATICACIDVEN in the last 168 hours.  No results found for this or any previous visit (from the past 240 hour(s)).       Radiology Studies: MR Brain W and Wo Contrast  Result Date: 03/24/2022 CLINICAL DATA:  Initial evaluation for metastatic disease. EXAM: MRI HEAD WITHOUT AND WITH CONTRAST TECHNIQUE: Multiplanar, multiecho pulse sequences of the brain and surrounding structures were obtained without and with intravenous contrast. CONTRAST:  56m GADAVIST GADOBUTROL 1 MMOL/ML IV SOLN COMPARISON:  None available. FINDINGS: Brain: Cerebral volume within normal limits. Irregular dural thickening and enhancement seen overlying the anterior left temporal lobe, consistent with metastatic disease. Associated leptomeningeal involvement with extension to involve the underlying brain parenchyma (series 11, image 51). Associated localized vasogenic edema at this location without significant regional mass effect. Additional probable dural involvement noted superiorly and anteriorly overlying the frontal lobes bilaterally (series 11, image 77). Patchy dural and leptomeningeal involvement noted along the adjacent anterior falx (series 11, image 76). Few additional small left open in G ule and/or parenchymal metastases noted elsewhere involving both cerebral hemispheres, largest of which seen at the posterior left frontal region  and measures 7 mm (series 11, image 110). Few small metastatic deposits noted involving the right cerebellum (series 11, image 27) and right paramedian cerebellar vermis (series 11, image 49). Probable involvement of the right 7/8 nerve root complex at the right IAC (series 11, image 27). No other significant associated edema. No associated hemorrhage. No evidence for acute or subacute infarct. Gray-white matter differentiation otherwise maintained. No other areas of chronic cortical infarction or other insult. No acute or chronic intracranial blood products. Ventricles normal size without hydrocephalus. No extra-axial fluid collection. Pituitary gland suprasellar region within normal limits. Midline structures intact and normally formed. Vascular: Major intracranial vascular flow voids are maintained. Asymmetric FLAIR signal intensity without T1 correlate involving the left transverse sinus noted, likely slow/sluggish flow. Skull and upper cervical spine: Craniocervical junction within normal limits. Bone marrow signal intensity is diffusely heterogeneous without visible focal marrow replacing lesion. Tiny 4 mm  nodular FLAIR hyperintensity involving the left frontal scalp noted, indeterminate (series 11, image 34). No other focal scalp lesions. Sinuses/Orbits: Globes and orbital soft tissues demonstrate no acute finding. Paranasal sinuses are largely clear. No significant mastoid effusion. Other: None. IMPRESSION: 1. Multiple scattered dural, leptomeningeal, and parenchymal intracranial metastasis as detailed above. Associated vasogenic edema at the anterior left temporal lobe without significant regional mass effect. 2. 4 mm nodular FLAIR hyperintensity involving the left frontal scalp, indeterminate. Correlation with physical exam recommended. 3. No other acute intracranial abnormality. Electronically Signed   By: Jeannine Boga M.D.   On: 03/24/2022 22:54   CT CHEST ABDOMEN PELVIS W CONTRAST  Result  Date: 03/25/2022 CLINICAL DATA:  CNS neoplasm, staging workup. Inpatient encounter. Stage IV breast cancer with diffuse bone metastases. EXAM: CT CHEST, ABDOMEN, AND PELVIS WITH CONTRAST TECHNIQUE: Multidetector CT imaging of the chest, abdomen and pelvis was performed following the standard protocol during bolus administration of intravenous contrast. RADIATION DOSE REDUCTION: This exam was performed according to the departmental dose-optimization program which includes automated exposure control, adjustment of the mA and/or kV according to patient size and/or use of iterative reconstruction technique. CONTRAST:  169m OMNIPAQUE IOHEXOL 300 MG/ML  SOLN COMPARISON:  CT chest with contrast 01/03/2022, CT abdomen and pelvis with contrast 02/11/2022 FINDINGS: CT CHEST FINDINGS Cardiovascular: The cardiac size is normal. There is no pericardial effusion. Aberrant retroesophageal right subclavian artery again noted. There is aortic tortuosity without significant atherosclerotic change apart from trace calcification in the valve plane. The great vessels opacify normally. The pulmonary veins are decompressed. Pulmonary arteries are normal caliber centrally clear. Mediastinum/Nodes: No thyroid mass, tracheal filling defect or significant esophageal thickening. Small hiatal hernia. There are calcified subcarinal and right hilar lymph nodes. There is no appreciable noncalcified intrathoracic or axillary adenopathy. Lungs/Pleura: There are increased small to moderate-sized layering pleural effusions with adjacent consolidation or compressive atelectasis in the posterior lower lobes, with consolidation favored on the right where there is additional perihilar consolidation extending into the superior segment. Findings are concerning for right lower lobe pneumonia and there is a also scattered ground-glass opacity posteriorly in the right upper lobe likely additional sites of pneumonitis. There is stable redemonstration of tiny  subpleural nodules, for example on 5:39 in the right lower and left lower lobes and in the right lower lung field on axial images 67, 68 and 69. Reticulated pleural-parenchymal scarring in the apices is unchanged. No new or enlarging nodule is seen.  There is no pneumothorax. Musculoskeletal: Diffuse sclerotic osteoblastic metastases are again noted. There is thoracic kyphosis and multilevel wedging in the mid to lower thoracic spine but no increased thoracic wedging. Advanced asymmetric left glenohumeral DJD with calcified loose bodies and joint effusion is again noted. There is new demonstration of an oblique distal left clavicle shaft fracture with anterior displacement of the distal fragment without AC joint dislocation. The fracture is probably pathologic and occurred through a sclerotic lesion although could be traumatic. Age of the fracture is indeterminate but there is no healing callus, and this was not seen on the left shoulder series of 02/05/2021. CT ABDOMEN PELVIS FINDINGS Hepatobiliary: A 10 mm low-density lesion is again noted in the dome of the liver in segment 8 (3:42) and is unchanged. Capsular nodularity primarily along the left lobe is again noted consistent with cirrhosis with recanalized umbilical vein but the hepatic portal vein is patent and within normal caliber limits. Nonmasslike enhancement heterogeneity is again seen inferiorly in the right lobe and in  the outer aspect of the left lobe lateral segment and was seen previously. There is no new liver abnormality. The gallbladder and bile ducts are unremarkable. Pancreas: No focal abnormality or ductal dilatation. An 8 mm low-density lesion in the pancreatic uncinate process has been noted on prior studies unchanged from February 2021 but this area is largely obscured due to beam hardening artifact. In any case this is likely still present and unchanged. Spleen: Slightly enlarged with scattered calcified granulomas without mass.  Adrenals/Urinary Tract: No adrenal or renal cortical mass enhancement, calculus or hydronephrosis. The bladder is obscured by extensive metal artifact from bilateral hip replacements. Stomach/Bowel: There are thickened folds in the stomach. The small bowel is unremarkable. The bowel is displaced inward by large-volume free ascites. There are thickened folds in the ascending colon. Sigmoid diverticula without acute inflammatory change or further colonic wall thickening. Vascular/Lymphatic: No adenopathy is visible through the ascites. Mild aortic atherosclerosis without AAA. Patent portal vein, SMV and splenic vein. Patent IVC and pelvic deep veins. Reproductive: The uterus is intact. No adnexal masses seen. Limited view of the lower pelvic sidewalls due to hip replacements. Other: There is no incarcerated hernia. There is interval worsening large-volume free ascites in the abdomen and pelvis. There is interval increased body wall anasarca and mesenteric congestive changes. There is no free air no appreciable free hemorrhage or abscess. Musculoskeletal: There are again noted diffuse sclerotic skeletal metastases. No regional skeletal fracture is seen. Old hip replacements. IMPRESSION: 1. Interval worsening of small to moderate symmetric layering pleural effusions with adjacent consolidation in the right lower lobe, favor compressive atelectatic effect in the posterior left lower lobe. 2. Additional areas of ground-glass infiltrate in the right upper lobe probably from pneumonitis. 3. Stable tiny subpleural nodularity in the lungs. No thoracic adenopathy is seen no new or increased nodules. 4. Hiatal hernia. 5. Cirrhotic changes in the liver with recanalized umbilical vein, worsening body wall anasarca, worsening large-volume free ascites displacing the bowel inward. 6. Stable 10 mm hypodense lesion in the dome of segment 8 of the liver, nonspecific heterogeneity in enhancement in the inferior right and outer left  lobe lateral segments. 7. Small hypodense lesion in the pancreatic head described on prior studies is likely being obscured by moderate beam hardening artifact but is probably unchanged. 8. Thickened folds in the stomach and ascending colon which could be due to gastritis/colitis or changes of portal hypertension versus reactive related to the ascites. 9. No abdominal adenopathy is seen through the extensive ascites and mesenteric congestive changes. 10. Diffuse extensive skeletal metastatic disease appears similar, but today there is also a newly noted fracture in the distal left clavicle with anterior displacement without AC joint dislocation, favored pathologic. No other visible regional skeletal fracture. 11. Aortic atherosclerosis, small amount. Electronically Signed   By: Telford Nab M.D.   On: 03/25/2022 21:26   MR TOTAL SPINE METS SCREENING  Result Date: 03/26/2022 CLINICAL DATA:  66 year old female with metastatic breast cancer. 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 EXAM: MRI TOTAL SPINE WITHOUT AND WITH CONTRAST TECHNIQUE: Multisequence MR imaging of the spine from the cervical spine to the sacrum was performed prior to and following IV contrast administration for evaluation of spinal metastatic disease. CONTRAST:  25m GADAVIST GADOBUTROL 1 MMOL/ML IV SOLN COMPARISON:  Brain MRI 03/24/2022.  Total spine MRI 01/03/2022. FINDINGS: MRI CERVICAL SPINE FINDINGS Alignment: Stable since March. Mild straightening of cervical lordosis. Vertebrae: Diffuse abnormal marrow signal in  keeping with chronic widespread osseous metastatic disease. No convincing acute marrow edema. Limited fat sat postcontrast visualization of the cervical spine similar to the March MRI. But postcontrast axial imaging obtained from the C3 through the C7 level. No conspicuous vertebral enhancement. Cord: Stable since March. Mild degenerative spinal stenosis related cord mass effect at  C5-C6. No cord edema. And on axial postcontrast images there is no leptomeningeal enhancement or dural thickening identified. Posterior Fossa, vertebral arteries, paraspinal tissues: No neck mass or lymphadenopathy is evident. Grossly normal visible basilar cisterns. Disc levels: No change from March.  Mild degenerative spinal stenosis at C5-C6. MRI THORACIC SPINE FINDINGS Segmentation:  Appears normal. Alignment: Stable thoracic vertebral height and alignment since March with exaggerated kyphosis. Vertebrae: Diffuse osseous metastatic disease, diffuse abnormal marrow replacement. Cord: Stable since March. Chronic degenerative mild spinal cord mass effect at T5-T6 appears related to chronic disc or disc osteophyte protrusion (series 102, image 8 on axial images). No thoracic spinal cord edema. No abnormal intradural enhancement or dural thickening identified. Paraspinal and other soft tissues: Moderate bilateral layering pleural effusions appear to be new since March (series 102, image 11). Disc levels: Stable since March. Borderline to mild degenerative spinal stenosis at T5-T6. MRI LUMBAR SPINE FINDINGS Segmentation: Transitional anatomy. Lumbarized S1 level as designated on the previous total spine MRI. Alignment: Stable lordosis, subtle chronic anterolisthesis of L5 on S1. Vertebrae: Diffuse marrow replacement due to osseous metastatic disease. Conus medullaris: Extends to the L1-L2 level, appears stable and within normal limits. No abnormal intradural enhancement or dural thickening identified. On axial T2 images (series 104) there is no thickening of the cauda equina nerve roots. Paraspinal and other soft tissues: Grossly stable. Disc levels: Chronic degenerative spinal stenosis at L5-S1 is moderate to severe and multifactorial related to anterolisthesis, disc bulging, severe facet and ligament flavum hypertrophy. Degenerative facet joint fluid is chronic. However, previously suspected large synovial cyst  complicating the stenosis here appears regressed or resolved since March. IMPRESSION: 1. No leptomeningeal, spinal cord, or cauda equina metastatic disease identified. 2. Diffuse osseous metastatic disease with highly abnormal marrow signal, not significantly changed from total spine MRI in March. 3. 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 Electronically Signed   By: Genevie Ann M.D.   On: 03/26/2022 07:57        Scheduled Meds:  dexamethasone  4 mg Oral Q8H   furosemide  40 mg Oral Daily   levETIRAcetam  500 mg Oral BID   levothyroxine  50 mcg Oral Q0600   spironolactone  100 mg Oral Daily   Continuous Infusions:     LOS: 2 days    Time spent: 45 minutes spent on chart review, discussion with nursing staff, consultants, updating family and interview/physical exam; more than 50% of that time was spent in counseling and/or coordination of care.    Geradine Girt, DO [facility] Available via Epic secure chat 7am-7pm After these hours, please refer to coverage provider listed on [email or web address] 03/26/2022, 12:24 PM

## 2022-03-26 NOTE — Discharge Summary (Signed)
Physician Discharge Summary  CHELSEI MCCHESNEY JJO:[id number] DOB: 06-Oct-1956 DOA: 03/24/2022  PCP: Binnie Rail, MD  Admit date: 03/24/2022 Discharge date: 03/26/2022  Admitted From: home Discharge disposition: home   Recommendations for Outpatient Follow-Up:   Multiple labs from GI pending-- patient will follow up with Dr. Lyndel Safe- alpha 1, ceruloplasmin, Anti-smooth muscle, ANA, mitochondrial, hepatitis panel, culture, cytology etc Pt and  husband have just spoken with radiation oncology and plan is for her to be seen at [facility] early next week by radiation oncology there prior to initiation of therapy   Discharge Diagnosis:   Principal Problem:   Metastasis to brain [facility]) Active Problems:   History of seizure   Acquired hypothyroidism   Chronic anemia    Discharge Condition: Improved.  Diet recommendation: Low sodium, heart healthy  Wound care: None.  Code status: Full.   History of Present Illness:   Brittany [name] is a 66 y.o. female with medical history significant for metastatic breast cancer, acquired hypothyroidism, recently diagnosed seizures, chronic anemia with baseline hemoglobin 8-10, who is admitted to [facility] on 03/24/2022 with new diagnosis of metastatic disease to the brain after presenting from home to [facility] ED for follow-up following recent diagnosis of seizures.   D patient was recently evaluated last Friday, 6 days ago, after a single episode of syncope associated with seizures.  She started on Keppra at that time, CT head showed no evidence of acute intracranial process.  She was discharged home, with instructions to follow up as an outpatient for this episode of syncope as well as new diagnosis of seizures.  In attempting to comply with this recommendation for follow-up, the patient presented to [facility] emergency department this evening, at which time MRI brain revealed evidence of new diagnosis of intracranial metastasis with evidence  of vasogenic edema.  She is without acute complaint at this time.  Denies any interval seizures and no additional episodes of syncope.   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      Elevated LFTs- known liver cirrhosis with ascites -lasix/aldactone resumed-- will need BMP outpatient IR for paracentesis: 4.7L removed -GI consult appreciated:  -2g sodium, normal protein diet -Lasix/Aldactone 40/100.  Monitor renal function.  Increase as tolerated. -Agree with Protonix. -If possible, send ascitic fluid for alb. -Follow cytology to r/o malignant ascites. -Send acute hepatitis panel, AMA, ASMA, inheritable liver disease markers. -Overall supportive therapy. -FU GI as outpt.    Patient requesting to be d/c'd home so she can follow up with [facility] next week  Medical Consultants:   GI Rad onc oncology   Discharge Exam:   Vitals:   03/26/22 1212 03/26/22 1343  BP: 129/74 116/62  Pulse:  80  Resp:  18  Temp:  98.1 F (36.7 C)  SpO2:  95%   Vitals:   03/26/22 0500 03/26/22 1156 03/26/22 1212 03/26/22 1343  BP:  140/88 129/74 116/62  Pulse:    80  Resp:    18  Temp:    98.1  F (36.7 C)  TempSrc:    Oral  SpO2:    95%  Weight: 76.9 kg     Height:        General exam: Appears calm and comfortable.    The results of significant diagnostics from this hospitalization (including imaging, microbiology, ancillary and laboratory) are listed below for reference.     Procedures and Diagnostic Studies:   US  Paracentesis  Result Date: 03/26/2022 INDICATION: Patient with history of metastatic breast cancer, elevated liver function tests, cirrhosis, ascites. Request received for diagnostic and therapeutic paracentesis. EXAM: ULTRASOUND GUIDED DIAGNOSTIC AND THERAPEUTIC PARACENTESIS MEDICATIONS: 8 mL of 1% lidocaine COMPLICATIONS: None immediate. PROCEDURE: Informed written consent was obtained from the patient after a discussion of the risks, benefits and alternatives to treatment. A timeout was performed prior to the initiation of the procedure. Initial ultrasound scanning demonstrates a moderate to large amount of ascites within the right mid to lower abdominal quadrant. The right mid to lower abdomen was prepped and draped in the usual sterile fashion. 1% lidocaine was used for local anesthesia. Following this, a 19 gauge, 10-cm, Yueh catheter was introduced. An ultrasound image was saved for documentation purposes. The paracentesis was performed. The catheter was removed and a dressing was applied. The patient tolerated the procedure well without immediate post procedural complication. FINDINGS: A total of approximately 4.7 liters of yellow fluid was removed. Samples were sent to the laboratory as requested by the clinical team. IMPRESSION: Successful ultrasound-guided diagnostic and therapeutic paracentesis yielding 4.7 liters of peritoneal fluid. Read by: Rowe Robert, PA-C Electronically Signed   By: Markus Daft M.D.   On: 03/26/2022 14:27   MR TOTAL SPINE METS SCREENING  Result Date: 03/26/2022 CLINICAL DATA:  66 year old female with metastatic breast cancer. 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 EXAM: MRI TOTAL SPINE WITHOUT AND WITH CONTRAST TECHNIQUE: Multisequence MR imaging of the spine from the cervical spine to the sacrum was performed prior to and following IV contrast administration for evaluation of spinal metastatic disease. CONTRAST:  45m GADAVIST  GADOBUTROL 1 MMOL/ML IV SOLN COMPARISON:  Brain MRI 03/24/2022.  Total spine MRI 01/03/2022. FINDINGS: MRI CERVICAL SPINE FINDINGS Alignment: Stable since March. Mild straightening of cervical lordosis. Vertebrae: Diffuse abnormal marrow signal in keeping with chronic widespread osseous metastatic disease. No convincing acute marrow edema. Limited fat sat postcontrast visualization of the cervical spine similar to the March MRI. But postcontrast axial imaging obtained from the C3 through the C7 level. No conspicuous vertebral enhancement. Cord: Stable since March. Mild degenerative spinal stenosis related cord mass effect at C5-C6. No cord edema. And on axial postcontrast images there is no leptomeningeal enhancement or dural thickening identified. Posterior Fossa, vertebral arteries, paraspinal tissues: No neck mass or lymphadenopathy is evident. Grossly normal visible basilar cisterns. Disc levels: No change from March.  Mild degenerative spinal stenosis at C5-C6. MRI THORACIC SPINE FINDINGS Segmentation:  Appears normal. Alignment: Stable thoracic vertebral height and alignment since March with exaggerated kyphosis. Vertebrae: Diffuse osseous metastatic disease, diffuse abnormal marrow replacement. Cord: Stable since March. Chronic degenerative mild spinal cord mass effect at T5-T6 appears related to chronic disc or disc osteophyte protrusion (series 102, image 8 on axial images). No thoracic spinal cord edema. No abnormal intradural enhancement or dural thickening identified. Paraspinal and other soft tissues: Moderate bilateral layering pleural effusions appear to be new since March (series 102, image 11). Disc levels: Stable since March. Borderline to mild degenerative spinal stenosis at  T5-T6. MRI LUMBAR SPINE FINDINGS Segmentation: Transitional anatomy. Lumbarized S1 level as designated on the previous total spine MRI. Alignment: Stable lordosis, subtle chronic anterolisthesis of L5 on S1. Vertebrae: Diffuse  marrow replacement due to osseous metastatic disease. Conus medullaris: Extends to the L1-L2 level, appears stable and within normal limits. No abnormal intradural enhancement or dural thickening identified. On axial T2 images (series 104) there is no thickening of the cauda equina nerve roots. Paraspinal and other soft tissues: Grossly stable. Disc levels: Chronic degenerative spinal stenosis at L5-S1 is moderate to severe and multifactorial related to anterolisthesis, disc bulging, severe facet and ligament flavum hypertrophy. Degenerative facet joint fluid is chronic. However, previously suspected large synovial cyst complicating the stenosis here appears regressed or resolved since March. IMPRESSION: 1. No leptomeningeal, spinal cord, or cauda equina metastatic disease identified. 2. Diffuse osseous metastatic disease with highly abnormal marrow signal, not significantly changed from total spine MRI in March. 3. 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 Electronically Signed   By: Genevie Ann M.D.   On: 03/26/2022 07:57     Labs:   Basic Metabolic Panel: Recent Labs  Lab 03/24/22 0040 03/24/22 2057 03/24/22 2057 03/25/22 0423 03/26/22 0405  NA  --  132*  --  134* 132*  K  --  3.8   < > 3.9 4.0  CL  --  98  --  100 100  CO2  --  26  --  25 27  GLUCOSE  --  114*  --  109* 133*  BUN  --  25*  --  26* 23  CREATININE  --  0.60  --  0.78 0.71  CALCIUM  --  8.1*  --  8.2* 7.4*  MG 2.1  --   --  2.3  --    < > = values in this interval not displayed.   GFR Estimated Creatinine Clearance: 72.9 mL/min (by C-G formula based on SCr of 0.71 mg/dL). Liver Function Tests: Recent Labs  Lab 03/24/22 2057 03/25/22 0423 03/26/22 0405  AST 179* 181* 174*  ALT 79* 81* 81*  ALKPHOS 117 118 111  BILITOT 1.8* 1.9* 2.0*  PROT 5.8* 6.0* 5.6*  ALBUMIN 2.8* 2.8* 2.6*   No results for input(s):  LIPASE, AMYLASE in the last 168 hours. No results for input(s): AMMONIA in the last 168 hours. Coagulation profile Recent Labs  Lab 03/24/22 2057  INR 1.5*    CBC: Recent Labs  Lab 03/24/22 2057 03/25/22 0423 03/26/22 0405  WBC 6.8 6.2 8.8  NEUTROABS 4.2 4.0  --   HGB 9.3* 9.1* 9.6*  HCT 28.9* 28.3* 29.4*  MCV 119.4* 120.9* 119.5*  PLT 136* 133* 117*   Cardiac Enzymes: No results for input(s): CKTOTAL, CKMB, CKMBINDEX, TROPONINI in the last 168 hours. BNP: Invalid input(s): POCBNP CBG: No results for input(s): GLUCAP in the last 168 hours. D-Dimer No results for input(s): DDIMER in the last 72 hours. Hgb A1c No results for input(s): HGBA1C in the last 72 hours. Lipid Profile No results for input(s): CHOL, HDL, LDLCALC, TRIG, CHOLHDL, LDLDIRECT in the last 72 hours. Thyroid function studies No results for input(s): TSH, T4TOTAL, T3FREE, THYROIDAB in the last 72 hours.  Invalid input(s): FREET3 Anemia work up Recent Labs    03/26/22 1613  FERRITIN 3,106*   Microbiology No results found for this or any previous visit (from the past 240 hour(s)).   Discharge  Instructions:   Discharge Instructions     Diet - low sodium heart healthy   Complete by: As directed    Increase activity slowly   Complete by: As directed       Allergies as of 03/26/2022   No Known Allergies      Medication List     STOP taking these medications    Calcium Citrate 250 MG Tabs       TAKE these medications    calcium carbonate 500 MG chewable tablet Commonly known as: TUMS - dosed in mg elemental calcium Chew 1 tablet by mouth 4 (four) times daily as needed for indigestion or heartburn.   dexamethasone 4 MG tablet Commonly known as: DECADRON Take 1 tablet (4 mg total) by mouth 3 (three) times daily.   furosemide 40 MG tablet Commonly known as: LASIX Take 40 mg by mouth daily. What changed: Another medication with the same name was removed. Continue taking this  medication, and follow the directions you see here.   levETIRAcetam 500 MG tablet Commonly known as: Keppra Take 1 tablet (500 mg total) by mouth 2 (two) times daily.   levothyroxine 50 MCG tablet Commonly known as: SYNTHROID TAKE ONE TABLET BY MOUTH DAILY   memantine 5 MG tablet Commonly known as: Namenda Begin this prescription the first day of brain radiation. Week 1: take one tablet po qam. Week 2: take one tablet qam and qpm. Week 3: take two tablets qam, and one tablet po q pm. Week 4: take two tablets qam and qpm. Fill subsequent prescription q month.   memantine 10 MG tablet Commonly known as: Namenda Take 1 tablet (10 mg total) by mouth 2 (two) times daily.   pantoprazole 40 MG tablet Commonly known as: PROTONIX Take 1 tablet (40 mg total) by mouth daily.   spironolactone 100 MG tablet Commonly known as: Aldactone Take 1 tablet (100 mg total) by mouth daily.   UNABLE TO FIND Med Name : sildenafil 2% cream apply to hands and feet bid prn for CIPN   Vitamin D3 25 MCG (1000 UT) Caps Take 1 capsule by mouth daily.        Follow-up Information     Binnie Rail, MD Follow up in 1 week(s).   Specialty: Internal Medicine Why: BMP- on lasix/aldactone Contact information: [place] [address] [phone]                  Time coordinating discharge: 55 min  Signed:  Geradine Girt DO  [facility] 03/26/2022, 5:51 PM

## 2022-03-26 NOTE — Plan of Care (Signed)
  Problem: Education: Goal: Knowledge of General Education information will improve Description: Including pain rating scale, medication(s)/side effects and non-pharmacologic comfort measures Outcome: Progressing   Problem: Clinical Measurements: Goal: Will remain free from infection Outcome: Progressing Goal: Diagnostic test results will improve Outcome: Progressing Goal: Respiratory complications will improve Outcome: Progressing   Problem: Nutrition: Goal: Adequate nutrition will be maintained Outcome: Progressing

## 2022-03-26 NOTE — Plan of Care (Signed)

## 2022-03-26 NOTE — Consult Note (Addendum)
Consultation  Referring Provider: TRH/Vann Primary Care Physician:  Binnie Rail, MD Primary Gastroenterologist:  remote Dr Earlean Shawl  Reason for Consultation: New finding of cirrhosis, ascites  HPI: Brittany [name] is a 66 y.o. female, who was admitted to the hospital yesterday after presenting with generalized weakness, and complaints of lower extremity swelling and abdominal swelling which has been progressive over the past month. 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  She had very recently been traveling in [place] with her husband and had a seizure.  She was started on Keppra and imaging at that time in [place] with concerns for possible metastatic disease. He had MRI of the brain done 03/24/2022, that shows multiple scattered intracranial metastases. 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  Liver nodular consistent with cirrhosis, the 10 mm right lobe liver lesion present, clean and slightly enlarged, large volume of ascites, also noted thickened gastric folds.  Patient has been seen by Oncology here, is undergoing radiation simulation today, and is currently in ultrasound to have paracentesis.  Labs on admit show hemoglobin 9.6/hematocrit 29.4/MCV 119/platelets 117 Sodium 132/potassium 4/BUN 23/creatinine 0.7 T. bili 2.0/alk phos 111/AST 174/ALT 81.  Reviewing LFTs have been mildly  elevated over the past couple of months. Pro time 17.5/INR 1.5  Paracentesis-4.7 L removed, cytology pending cell counts reviewed, not consistent with SBP, protein less than 3, culture pending.  Patient and husband, all are aware of diagnosis of cirrhosis.  They say this was mentioned earlier this year after one of her scans.  She just started accumulating fluid in her legs and in her abdomen within the past few weeks that has been progressive.  Her husband says he has been concerned because this has coincided with a significant decrease in appetite.  She has not been eating much at all and complains of heartburn indigestion and fullness postprandially.  She has not been on PPI at home, just Tums. Weight up 20 pounds over the past 3 weeks.  She had been started on 40 mg daily and Aldactone 100 mg daily earlier this week. She has no family history of liver disease known, does drink a couple of alcoholic beverages per week, no history of daily EtOH use, no history of viral hepatitis that she is aware of. She did undergo remote chemotherapy in 1996 when initially diagnosed doxorubicin and cyclophosphamide, took tamoxifen for 5 years. On Zygiva since February 2021, started capecitabine 02/25/2020-held since November 22  She says she has not been having any abdominal pain, just feels full, she does feel a bit better post paracentesis.  Past Medical History:  Diagnosis Date   ANEMIA-NOS    Asymptomatic varicose veins    Bone metastases 2021   Breast cancer ([place]) 1996   LEFT BREAST CA   BREAST CANCER, HX OF 01/1995 dx   s/p Lumpectomy, XRT, chemo and 52yrarmidex   Complication of anesthesia    Gestational diabetes mellitus in  childbirth, diet controlled 1985   Hypothyroid 09/28/2015   Dx 09/2015   Leukopenia    mild since chemo   OSTEOARTHRITIS    Personal history of chemotherapy    Personal history of radiation therapy    SCOLIOSIS, MILD     Past Surgical History:  Procedure Laterality  Date   BREAST BIOPSY     BREAST LUMPECTOMY Left    1996   HIP CLOSED REDUCTION N/A 02/12/2022   Procedure: CLOSED MANIPULATION HIP;  Surgeon: Tania Ade, MD;  Location: [facility];  Service: Orthopedics;  Laterality: N/A;   left lumpectomy  [place]     2002 & 2003-Dr. vail @ [place]    Prior to Admission medications   Medication Sig Start Date End Date Taking? Authorizing Provider  calcium carbonate (TUMS - DOSED IN MG ELEMENTAL CALCIUM) 500 MG chewable tablet Chew 1 tablet by mouth 4 (four) times daily as needed for indigestion or heartburn.   Yes [provider]  Cholecalciferol (VITAMIN D3) 1000 UNITS CAPS Take 1 capsule by mouth daily.   Yes [provider]  furosemide (LASIX) 40 MG tablet Take 40 mg by mouth daily.   Yes [provider]  levETIRAcetam (KEPPRA) 500 MG tablet Take 1 tablet (500 mg total) by mouth 2 (two) times daily. 03/24/22  Yes Burns, Claudina Lick, MD  levothyroxine (SYNTHROID) 50 MCG tablet TAKE ONE TABLET BY MOUTH DAILY 12/07/21  Yes Burns, Claudina Lick, MD  spironolactone (ALDACTONE) 100 MG tablet Take 1 tablet (100 mg total) by mouth daily. 03/24/22  Yes Burns, Claudina Lick, MD  UNABLE TO FIND Med Name : sildenafil 2% cream apply to hands and feet bid prn for CIPN 01/25/22  Yes Iruku, Arletha Pili, MD  Calcium Citrate 250 MG TABS Take 4 tablets (1,000 mg total) by mouth daily. Patient not taking: Reported on 03/24/2022 09/14/13   Rowe Clack, MD  dexamethasone (DECADRON) 4 MG tablet Take 1 tablet (4 mg total) by mouth 3 (three) times daily. 03/25/22   Hayden Pedro, PA-C  furosemide (LASIX) 20 MG tablet Take 2 tablets (40 mg total) by mouth daily. Patient not taking: Reported on 03/24/2022 03/24/22   Binnie Rail, MD  memantine (NAMENDA) 10 MG tablet Take 1 tablet (10 mg total) by mouth 2 (two) times daily. 03/25/22   Hayden Pedro, PA-C  memantine [facility]) 5 MG tablet Begin this prescription the  first day of brain radiation. Week 1: take one tablet po qam. Week 2: take one tablet qam and qpm. Week 3: take two tablets qam, and one tablet po q pm. Week 4: take two tablets qam and qpm. Fill subsequent prescription q month. 03/25/22   Hayden Pedro, PA-C    Current Facility-Administered Medications  Medication Dose Route Frequency Provider Last Rate Last Admin   acetaminophen (TYLENOL) tablet 650 mg  650 mg Oral Q6H PRN Howerter, Justin B, DO       Or   acetaminophen (TYLENOL) suppository 650 mg  650 mg Rectal Q6H PRN Howerter, Justin B, DO       dexamethasone (DECADRON) tablet 4 mg  4 mg Oral Q8H Hayden Pedro, PA-C   4 mg at 03/26/22 5364   furosemide (LASIX) tablet 40 mg  40 mg Oral Daily Vann, Jessica U, DO       levETIRAcetam (KEPPRA) tablet 500 mg  500 mg Oral BID Howerter, Justin B, DO   500 mg at  03/26/22 1128   levothyroxine (SYNTHROID) tablet 50 mcg  50 mcg Oral Q0600 Howerter, Justin B, DO   50 mcg at 03/26/22 0555   lidocaine (XYLOCAINE) 1 % (with pres) injection            ondansetron (ZOFRAN) injection 4 mg  4 mg Intravenous Q6H PRN Howerter, Justin B, DO       spironolactone (ALDACTONE) tablet 100 mg  100 mg Oral Daily Vann, Jessica U, DO        Allergies as of 03/24/2022   (No Known Allergies)    Family History  Problem Relation Age of Onset   Heart disease Father 68       AMI   Hyperlipidemia Father    Valvular heart disease Father 61   Parkinson's disease Father 4   Arthritis Mother    Breast cancer Mother 29   Hyperlipidemia Mother    Osteoporosis Mother    Pancreatic cancer Maternal Grandmother 46       presumed dx   Diabetes Paternal Grandfather        type 2   Breast cancer Maternal Aunt     Social History   Socioeconomic History   Marital status: Married    Spouse name: Not on file   Number of children: Not on file   Years of education: Not on file   Highest education level: Not on file  Occupational History   Not on file   Tobacco Use   Smoking status: Former    Types: Cigarettes    Quit date: 06/25/1977    Years since quitting: 44.7   Smokeless tobacco: Never  Vaping Use   Vaping Use: Never used  Substance and Sexual Activity   Alcohol use: Yes    Alcohol/week: 3.0 - 4.0 standard drinks    Types: 3 - 4 Glasses of wine per week   Drug use: No   Sexual activity: Not on file  Other Topics Concern   Not on file  Social History Narrative   Married-lives with spouse & son. Art gallery manager, enjoys golf, sports, bridge, swim, and walking dog   Social Determinants of Health   Financial Resource Strain: Low Risk    Difficulty of Paying Living Expenses: Not hard at all  Food Insecurity: No Food Insecurity   Worried About Charity fundraiser in the Last Year: Never true   Arboriculturist in the Last Year: Never true  Transportation Needs: No Transportation Needs   Lack of Transportation (Medical): No   Lack of Transportation (Non-Medical): No  Physical Activity: Sufficiently Active   Days of Exercise per Week: 7 days   Minutes of Exercise per Session: 60 min  Stress: No Stress Concern Present   Feeling of Stress : Not at all  Social Connections: Socially Integrated   Frequency of Communication with Friends and Family: Three times a week   Frequency of Social Gatherings with Friends and Family: Three times a week   Attends Religious Services: More than 4 times per year   Active Member of Clubs or Organizations: Yes   Attends Music therapist: More than 4 times per year   Marital Status: Married  Human resources officer Violence: Not At Risk   Fear of Current or Ex-Partner: No   Emotionally Abused: No   Physically Abused: No   Sexually Abused: No    Review of Systems: Pertinent positive and negative review of systems were noted in the above HPI section.  All  other review of systems was otherwise negative.   Physical Exam: Vital signs in last 24 hours: Temp:  [97.7 F (36.5 C)] 97.7 F  (36.5 C) (06/01 1955) Pulse Rate:  [86] 86 (06/01 1955) Resp:  [14] 14 (06/01 1955) BP: (139)/(74) 139/74 (06/01 1955) SpO2:  [93 %] 93 % (06/01 1955) Weight:  [76.9 kg] 76.9 kg (06/02 0500) Last BM Date : 03/24/22 General:   Alert,  Well-developed, well-nourished, older white female pleasant and cooperative in NAD.  Husband at bedside Head:  Normocephalic and atraumatic.  Bruising along her left temple from fall Eyes:  Sclera clear, no icterus.   Conjunctiva pink. Ears:  Normal auditory acuity. Nose:  No deformity, discharge,  or lesions. Mouth:  No deformity or lesions.   Neck:  Supple; no masses or thyromegaly. Lungs:  Clear throughout to auscultation.   Decreased breath sounds bilateral bases  Heart:  Regular rate and rhythm; no murmurs, clicks, rubs,  or gallops. Abdomen:  Soft, nontense ascites, but a full, nontender no palpable mass or hepatosplenomegaly bowel sounds present Rectal: Not done Msk:  Symmetrical without gross deformities. . Pulses:  Normal pulses noted. Extremities: 1+ edema bilateral lower extremities ankles Neurologic:  Alert and  oriented x4;  grossly normal neurologically. Skin:  Intact without significant lesions or rashes.. Psych:  Alert and cooperative. Normal mood and affect.  Intake/Output from previous day: 06/01 0701 - 06/02 0700 In: 1480.3 [P.O.:720; I.V.:760.3] Out: -  Intake/Output this shift: No intake/output data recorded.  Lab Results: Recent Labs    03/24/22 2057 03/25/22 0423 03/26/22 0405  WBC 6.8 6.2 8.8  HGB 9.3* 9.1* 9.6*  HCT 28.9* 28.3* 29.4*  PLT 136* 133* 117*   BMET Recent Labs    03/24/22 2057 03/25/22 0423 03/26/22 0405  NA 132* 134* 132*  K 3.8 3.9 4.0  CL 98 100 100  CO2 '26 25 27  ' GLUCOSE 114* 109* 133*  BUN 25* 26* 23  CREATININE 0.60 0.78 0.71  CALCIUM 8.1* 8.2* 7.4*   LFT Recent Labs    03/26/22 0405  PROT 5.6*  ALBUMIN 2.6*  AST 174*  ALT 81*  ALKPHOS 111  BILITOT 2.0*   PT/INR Recent Labs     03/24/22 2057  LABPROT 17.5*  INR 1.5*   Hepatitis Panel No results for input(s): HEPBSAG, HCVAB, HEPAIGM, HEPBIGM in the last 72 hours.   IMPRESSION:  #76 66 year old white female with history of known invasive lobular metastatic breast cancer with diffuse osseous mets Patient had initial diagnosis in 1996, then recurrence 2021 with bone metastases.  She has been on treatment since.  #2 new seizure-occurred about 2 weeks ago while she was on vacation in [place] On Keppra no recurrent seizure but has been having headaches #3 new finding of multiple brain metastases on MRI #4 new finding of cirrhotic liver and moderate to large ascites on imaging yesterday with admission. She also had MRI of the liver that showed a 10 mm lesion in the dome of the right lobe concerning for metastasis.  Etiology of the cirrhosis is not clear-possible NAFLD related, chemotherapy related, will rule out viral, autoimmune and inheritable liver disease though much more likely chemotherapy related or nonalcoholic fatty liver disease  Also need to consider that ascites may be malignant, she also has bilateral pleural effusions.  #5 coagulopathy likely secondary to cirrhosis  #6 macrocytic anemia, and thrombocytopenia-patient has been on chemo #7 poor appetite, early satiety, heartburn-all multifactoral secondary to above  Plan; await peritoneal fluid  cytologies and remaining labs 2 g sodium diet Okay to continue Lasix 40 mg p.o. every morning and Aldactone 100 mg p.o. every morning we will need to monitor renal function carefully especially if her intake is not good. Will send off viral serologies, autoimmune markers and inheritable markers. Start Protonix 40 mg p.o. every morning, Mylanta as needed  Pt and  husband have just spoken with radiation oncology and plan is for her to be seen at [facility] early next week by radiation oncology there prior to initiation of therapy  Amy Esterwood PA-C 03/26/2022, 11:38  AM    Attending physician's note   I have taken history, reviewed the chart and examined the patient. I performed a substantive portion of this encounter, including complete performance of at least one of the key components, in conjunction with the APP. I agree with the Advanced Practitioner's note, impression and recommendations.   Metastatic Br AdenoCa to bones, brain, likely liver (6m lesion R liver) New onset cirrhosis with splenomegaly, ascites, anasarca, coagulopathy with thrombocytopenia. ?etiology - NAFLD, DILI d/t chemo. No ETOH. Ascites s/p LVP (4 lit). Neg SBP, TP <3. I don't see alb). Cytology pending GERD/early satiety  Plan: -2g sodium, normal protein diet -Lasix/Aldactone 40/100.  Monitor renal function.  Increase as tolerated. -Agree with Protonix. -If possible, send ascitic fluid for alb. -Follow cytology to r/o malignant ascites. -Send acute hepatitis panel, AMA, ASMA, inheritable liver disease markers. -Overall supportive therapy. -FU GI as outpt. Will sign off for now   RCarmell Austria MD LVelora HecklerGI [phone]

## 2022-03-26 NOTE — Discharge Instructions (Signed)
Per [facility] statutes, patients with seizures are not allowed to drive until they have been seizure-free for six months.    Use caution when using heavy equipment or power tools. Avoid working on ladders or at heights. Take showers instead of baths. Ensure the water temperature is not too high on the home water heater. Do not go swimming alone. Do not lock yourself in a room alone (i.e. bathroom). When caring for infants or small children, sit down when holding, feeding, or changing them to minimize risk of injury to the child in the event you have a seizure. Maintain good sleep hygiene. Avoid alcohol.    If patient has another seizure, call 911 and bring them back to the ED if: A.  The seizure lasts longer than 5 minutes.      B.  The patient doesn't wake shortly after the seizure or has new problems such as difficulty seeing, speaking or moving following the seizure C.  The patient was injured during the seizure D.  The patient has a temperature over 102 F (39C) E.  The patient vomited during the seizure and now is having trouble breathing

## 2022-03-27 DIAGNOSIS — C50112 Malignant neoplasm of central portion of left female breast: Secondary | ICD-10-CM | POA: Diagnosis not present

## 2022-03-27 DIAGNOSIS — Z170 Estrogen receptor positive status [ER+]: Secondary | ICD-10-CM | POA: Diagnosis not present

## 2022-03-27 DIAGNOSIS — C7931 Secondary malignant neoplasm of brain: Secondary | ICD-10-CM | POA: Diagnosis present

## 2022-03-27 LAB — CERULOPLASMIN: Ceruloplasmin: 31.4 mg/dL (ref 19.0–39.0)

## 2022-03-27 LAB — ALPHA-1-ANTITRYPSIN: A-1 Antitrypsin, Ser: 198 mg/dL — ABNORMAL HIGH (ref 101–187)

## 2022-03-27 LAB — ANA: Anti Nuclear Antibody (ANA): NEGATIVE

## 2022-03-29 ENCOUNTER — Encounter: Payer: Self-pay | Admitting: Oncology

## 2022-03-29 ENCOUNTER — Telehealth: Payer: Self-pay

## 2022-03-29 ENCOUNTER — Inpatient Hospital Stay: Payer: Medicare Other | Admitting: Hematology and Oncology

## 2022-03-29 ENCOUNTER — Inpatient Hospital Stay: Payer: Medicare Other

## 2022-03-29 ENCOUNTER — Inpatient Hospital Stay: Payer: Medicare Other | Attending: Oncology

## 2022-03-29 ENCOUNTER — Ambulatory Visit: Payer: Medicare Other | Admitting: Radiation Oncology

## 2022-03-29 NOTE — Progress Notes (Deleted)
[place]  Telephone:(336) [phone] Fax:([phone]     ID: Brittany [name] DOB: [birth year]  MR#: [id number]  JOA#:[id number]  Patient Care Team: Binnie Rail, MD as PCP - General (Internal Medicine) Aloha Gell, MD as Consulting Physician (Obstetrics and Gynecology) Sydnee Levans, MD (Dermatology) Magrinat, Virgie Dad, MD (Inactive) as Consulting Physician (Oncology) Richmond Campbell, MD as Consulting Physician (Gastroenterology) Stefanie Libel, MD as Consulting Physician (Sports Medicine) Eunice Blase, MD (Family Medicine) Erline Levine, MD as Consulting Physician (Neurosurgery) Hetty Blend, Alfredo Bach, MD as Referring Physician (Orthopedic Surgery) Debbra Riding, MD as Consulting Physician (Ophthalmology) Raina Mina, RPH-CPP (Pharmacist) Benay Pike, MD OTHER MD:  CHIEF COMPLAINT: Stage IV lobular breast cancer  CURRENT TREATMENT: None   INTERVAL HISTORY:  Brittany [name] returns today for follow up of her stage IV lobular breast cancer.  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 She had a seizure while in [place] and flew back to the [place] She was seen by rad onc team while inpatient, started on Dex and Keppra. Rest of the pertinent 10 point ROS reviewed and negative.  Lab Results  Component Value Date   CA2729 133.2 (H) 02/15/2022   CA2729 115.8 (H) 01/21/2022   CA2729 81.3 (H) 12/24/2021   CA2729 58.2 (H) 11/26/2021   CA2729 36.3 10/28/2021   Lab Results  Component Value Date   CEA1 184.33 (H) 02/15/2022   CEA1 148.02 (H) 01/21/2022   CEA1 30.37 (H) 12/24/2021   CEA1 10.55 (H) 11/26/2021   CEA1 8.05 (H) 10/28/2021    REVIEW OF SYSTEMS:    COVID 19 VACCINATION STATUS: Status post Pfizer x2 with booster August 2021   HISTORY OF CURRENT ILLNESS: From the original intake note:  "Brittany" [name] has a history of left breast cancer dating back to 58.  She was followed by  Dr. Beryle Beams and underwent lumpectomy, adjuvant chemotherapy, radiation and "hormonal therapy".  Per Brittany's recollection the tumor was less than 2 cm, 2 out of 10 lymph nodes were involved, and it was a lobular breast cancer.  She took tamoxifen for many years and then raloxifene.  Dr. Beryle Beams also evaluated her for leukopenia which was noted at first in 2011, worked up with a negative ANA and negative rheumatoid factors.  He felt it was a benign residual from her earlier chemotherapy.  She was released from follow-up in 2015.  She has continued on intensified screening because of her family history.  On November 23, 2017 she had bilateral breast MRIs which were unremarkable.  Mammography November 20, 2018 showed no findings suspicious of malignancy.  Breast MRI 10/30/2019 showed a new irregular 0.4 cm enhancing focus in the upper inner quadrant of the right breast.  The left breast continued to be unremarkable except for postoperative changes and there were no abnormal appearing lymph nodes.  However in that same MRI multiple areas of enhancement were noted in the sternum.  This had not been seen in the prior MRI.  Plan right MRI biopsy 11/16/2019 was canceled as the previously demonstrated 0.4 cm focus in the right breast showed only a tortuous vein at that location.  However bone scan 11/19/2019 obtained to follow-up on the sternal findings showed additional areas of concern at T12, T7-T11, L4 and ribs, as well as the sternum.  The patient's subsequent history is as detailed below.   PAST MEDICAL HISTORY: Past Medical History:  Diagnosis Date   ANEMIA-NOS    Asymptomatic  varicose veins    Bone metastases 2021   Breast cancer ([place]) 1996   LEFT BREAST CA   BREAST CANCER, HX OF 01/1995 dx   s/p Lumpectomy, XRT, chemo and 66yrarmidex   Complication of anesthesia    Gestational diabetes mellitus in childbirth, diet controlled 1985   Hypothyroid 09/28/2015   Dx 09/2015   Leukopenia     mild since chemo   OSTEOARTHRITIS    Personal history of chemotherapy    Personal history of radiation therapy    SCOLIOSIS, MILD     PAST SURGICAL HISTORY: Past Surgical History:  Procedure Laterality Date   BREAST BIOPSY     BREAST LUMPECTOMY Left    1996   HIP CLOSED REDUCTION N/A 02/12/2022   Procedure: CLOSED MANIPULATION HIP;  Surgeon: CTania Ade MD;  Location: [facility];  Service: Orthopedics;  Laterality: N/A;   left lumpect[place]  [place]    [place]  Problem Relation Age of Onset   Heart disease Father 548      AMI   Hyperlipidemia Father    Valvular heart disease Father 71  Parkinson's disease Father 763  Arthritis Mother    Breast cancer Mother 553  Hyperlipidemia Mother    Osteoporosis Mother    Pancreatic cancer Maternal Grandmother 774      presumed dx   Diabetes Paternal Grandfather        type 2   Breast cancer Maternal Aunt   The patient's father died at the age of 871from heart disease.  He had Lewy body dementia.  The patient's mother is 838years old as of January 2021.  She is in a memory unit.  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  The patient's mother sister had breast cancer diagnosed around age 68  The patient's mother's mother also had cancer, "internal" (possibly ovarian or gastrointestinal).  There is no cancer on the father's side of the family.   GYNECOLOGIC HISTORY:  No LMP recorded. Patient is postmenopausal. Menarche: 66years old Age at first live birth: 66years old GBelle RoseP 2 LMP with chemotherapy, in 1996 Contraceptive a little over a year, with no complications HRT no  Hysterectomy?  No Salpingo-oophorectomy?  No   SOCIAL HISTORY:  DTashebagraduated from [employer] a degree in psychology and IT.  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  They both finally retired 03/28/2020.  Their daughter JAnderson Maltais a hPharmacist, hospitalin [place]  Their son JJenny Reichmannworks for the same wRyland Groupthat the patient worked for.  Note that JJenny Reichmanndid the [employer]  The patient has 1 grandchild (in [place].  The patient attends [employer] The patient's husband is her healthcare power of attorney   HEALTH MAINTENANCE: Social History   Tobacco Use   Smoking status: Former    Types: Cigarettes    Quit date: 06/25/1977    Years since quitting: 44.7   Smokeless tobacco:  Never  Vaping Use   Vaping Use: Never used  Substance Use Topics   Alcohol use: Yes    Alcohol/week: 3.0 - 4.0 standard drinks    Types: 3 - 4 Glasses of wine per week   Drug use: No     Colonoscopy: 2015  PAP: Up-to-date  Bone density: 11/20/2018, normal   No Known Allergies  Current Outpatient Medications  Medication Sig Dispense Refill   calcium carbonate (TUMS - DOSED IN MG ELEMENTAL CALCIUM) 500 MG chewable tablet Chew 1 tablet by mouth 4 (four) times daily as needed for indigestion or heartburn.     Cholecalciferol (VITAMIN D3) 1000 UNITS CAPS Take 1 capsule by mouth daily.     dexamethasone (DECADRON) 4 MG tablet Take 1 tablet (4 mg total) by mouth 3 (three) times daily. 90 tablet 1   furosemide (LASIX) 40 MG tablet Take 40 mg by mouth daily.     levETIRAcetam (KEPPRA) 500 MG tablet Take 1 tablet (500 mg total) by mouth 2 (two) times daily.     levothyroxine (SYNTHROID) 50 MCG tablet TAKE ONE TABLET BY MOUTH DAILY 90 tablet 3   memantine (NAMENDA) 10 MG tablet Take 1 tablet (10 mg total) by mouth 2 (two) times daily. 60 tablet 4   memantine (NAMENDA) 5 MG tablet Begin this prescription the first day of  brain radiation. Week 1: take one tablet po qam. Week 2: take one tablet qam and qpm. Week 3: take two tablets qam, and one tablet po q pm. Week 4: take two tablets qam and qpm. Fill subsequent prescription q month. 70 tablet 0   pantoprazole (PROTONIX) 40 MG tablet Take 1 tablet (40 mg total) by mouth daily. 30 tablet 0   spironolactone (ALDACTONE) 100 MG tablet Take 1 tablet (100 mg total) by mouth daily.     UNABLE TO FIND Med Name : sildenafil 2% cream apply to hands and feet bid prn for CIPN 30 g 3   No current facility-administered medications for this visit.    OBJECTIVE: White woman in no acute distress There were no vitals filed for this visit.       There is no height or weight on file to calculate BMI.   Wt Readings from Last 3 Encounters:  03/26/22 169 lb 9.6 oz (76.9 kg)  03/05/22 160 lb 6.4 oz (72.8 kg)  02/15/22 151 lb (68.5 kg)     ECOG FS:1 - Symptomatic but completely ambulatory  Physical Exam Constitutional:      Appearance: Normal appearance.  Pulmonary:     Effort: Pulmonary effort is normal.     Breath sounds: Normal breath sounds.  Chest:     Comments: No axillary adenopathy Abdominal:     General: Abdomen is flat. Bowel sounds are normal. There is distension.     Palpations: Abdomen is soft. There is no mass.  Musculoskeletal:        General: Swelling (BLE swelling) present. No tenderness. Normal range of motion.     Cervical back: Normal range of motion and neck supple. No rigidity.  Lymphadenopathy:     Cervical: No cervical adenopathy.  Skin:    Findings: Rash (HFS improving. NO ulcers but desquamation noted in heels) present.  Neurological:     General: No focal deficit present.     Mental Status: She is alert.  Psychiatric:        Mood and Affect: Mood normal.      LAB RESULTS:  CMP  Component Value Date/Time   NA 132 (L) 03/26/2022 0405   NA 139 12/10/2013 0841   K 4.0 03/26/2022 0405   K 4.5 12/10/2013 0841   CL 100 03/26/2022  0405   CL 102 12/01/2012 0955   CO2 27 03/26/2022 0405   CO2 28 12/10/2013 0841   GLUCOSE 133 (H) 03/26/2022 0405   GLUCOSE 81 12/10/2013 0841   GLUCOSE 88 12/01/2012 0955   BUN 23 03/26/2022 0405   BUN 14 08/04/2015 0000   BUN 17.5 12/10/2013 0841   CREATININE 0.71 03/26/2022 0405   CREATININE 0.9 12/10/2013 0841   CALCIUM 7.4 (L) 03/26/2022 0405   CALCIUM 9.6 12/10/2013 0841   PROT 5.6 (L) 03/26/2022 0405   PROT 6.7 12/10/2013 0841   ALBUMIN 2.6 (L) 03/26/2022 0405   ALBUMIN 3.9 12/10/2013 0841   AST 174 (H) 03/26/2022 0405   AST 22 12/10/2013 0841   ALT 81 (H) 03/26/2022 0405   ALT 27 12/10/2013 0841   ALKPHOS 111 03/26/2022 0405   ALKPHOS 54 12/10/2013 0841   BILITOT 2.0 (H) 03/26/2022 0405   BILITOT 0.41 12/10/2013 0841   GFRNONAA >60 03/26/2022 0405   GFRAA >60 07/02/2020 1324    No results found for: TOTALPROTELP, ALBUMINELP, A1GS, A2GS, BETS, BETA2SER, GAMS, MSPIKE, SPEI  No results found for: Nils Pyle, [facility]  Lab Results  Component Value Date   WBC 8.8 03/26/2022   NEUTROABS 4.0 03/25/2022   HGB 9.6 (L) 03/26/2022   HCT 29.4 (L) 03/26/2022   MCV 119.5 (H) 03/26/2022   PLT 117 (L) 03/26/2022      Chemistry      Component Value Date/Time   NA 132 (L) 03/26/2022 0405   NA 139 12/10/2013 0841   K 4.0 03/26/2022 0405   K 4.5 12/10/2013 0841   CL 100 03/26/2022 0405   CL 102 12/01/2012 0955   CO2 27 03/26/2022 0405   CO2 28 12/10/2013 0841   BUN 23 03/26/2022 0405   BUN 14 08/04/2015 0000   BUN 17.5 12/10/2013 0841   CREATININE 0.71 03/26/2022 0405   CREATININE 0.9 12/10/2013 0841   GLU 102 08/04/2015 0000      Component Value Date/Time   CALCIUM 7.4 (L) 03/26/2022 0405   CALCIUM 9.6 12/10/2013 0841   ALKPHOS 111 03/26/2022 0405   ALKPHOS 54 12/10/2013 0841   AST 174 (H) 03/26/2022 0405   AST 22 12/10/2013 0841   ALT 81 (H) 03/26/2022 0405   ALT 27 12/10/2013 0841   BILITOT 2.0 (H) 03/26/2022 0405   BILITOT 0.41  12/10/2013 0841      No results found for: LABCA2  No components found for: [id number]  Recent Labs  Lab 03/24/22 2057  INR 1.5*    No results found for: LABCA2  No results found for: NIO270  Lab Results  Component Value Date   JJK093 21.1 11/23/2019    No results found for: GHW299  Lab Results  Component Value Date   CA2729 133.2 (H) 02/15/2022    No components found for: HGQUANT  Lab Results  Component Value Date   CEA1 184.33 (H) 02/15/2022   /  CEA ([facility])  Date Value Ref Range Status  02/15/2022 184.33 (H) 0.00 - 5.00 ng/mL Final    Comment:    (NOTE) This test was performed using Architect's Chemiluminescent Microparticle Immunoassay. Values obtained from different assay methods cannot be used interchangeably. Please note that 5-10% of patients who smoke may see CEA levels up to 6.9 ng/mL.  Performed at [facility], [address] [place], [place] [address]      No results found for: AFPTUMOR  No results found for: [place]   No results found for: HGBA, HGBA2QUANT, HGBFQUANT, HGBSQUAN (Hemoglobinopathy evaluation)   Lab Results  Component Value Date   LDH 193 12/10/2013    No results found for: IRON, TIBC, IRONPCTSAT (Iron and TIBC)  Lab Results  Component Value Date   FERRITIN 3,106 (H) 03/26/2022    Urinalysis    Component Value Date/Time   COLORURINE YELLOW 03/24/2022 [place] 03/24/2022 1820   LABSPEC 1.012 03/24/2022 1820   PHURINE 6.0 03/24/2022 1820   GLUCOSEU NEGATIVE 03/24/2022 1820   GLUCOSEU NEGATIVE 09/17/2014 0824   HGBUR NEGATIVE 03/24/2022 [place] 03/24/2022 1820   KETONESUR NEGATIVE 03/24/2022 1820   PROTEINUR NEGATIVE 03/24/2022 1820   UROBILINOGEN 0.2 09/17/2014 0824   NITRITE NEGATIVE 03/24/2022 1820   LEUKOCYTESUR TRACE (A) 03/24/2022 1820    STUDIES: MR Brain W and Wo Contrast  Result Date: 03/24/2022 CLINICAL DATA:  Initial  evaluation for metastatic disease. EXAM: MRI HEAD WITHOUT AND WITH CONTRAST TECHNIQUE: Multiplanar, multiecho pulse sequences of the brain and surrounding structures were obtained without and with intravenous contrast. CONTRAST:  47m GADAVIST GADOBUTROL 1 MMOL/ML IV SOLN COMPARISON:  None available. FINDINGS: Brain: Cerebral volume within normal limits. Irregular dural thickening and enhancement seen overlying the anterior left temporal lobe, consistent with metastatic disease. Associated leptomeningeal involvement with extension to involve the underlying brain parenchyma (series 11, image 51). Associated localized vasogenic edema at this location without significant regional mass effect. Additional probable dural involvement noted superiorly and anteriorly overlying the frontal lobes bilaterally (series 11, image 77). Patchy dural and leptomeningeal involvement noted along the adjacent anterior falx (series 11, image 76). Few additional small left open in G ule and/or parenchymal metastases noted elsewhere involving both cerebral hemispheres, largest of which seen at the posterior left frontal region and measures 7 mm (series 11, image 110). Few small metastatic deposits noted involving the right cerebellum (series 11, image 27) and right paramedian cerebellar vermis (series 11, image 49). Probable involvement of the right 7/8 nerve root complex at the right IAC (series 11, image 27). No other significant associated edema. No associated hemorrhage. No evidence for acute or subacute infarct. Gray-white matter differentiation otherwise maintained. No other areas of chronic cortical infarction or other insult. No acute or chronic intracranial blood products. Ventricles normal size without hydrocephalus. No extra-axial fluid collection. Pituitary gland suprasellar region within normal limits. Midline structures intact and normally formed. Vascular: Major intracranial vascular flow voids are maintained. Asymmetric FLAIR  signal intensity without T1 correlate involving the left transverse sinus noted, likely slow/sluggish flow. Skull and upper cervical spine: Craniocervical junction within normal limits. Bone marrow signal intensity is diffusely heterogeneous without visible focal marrow replacing lesion. Tiny 4 mm nodular FLAIR hyperintensity involving the left frontal scalp noted, indeterminate (series 11, image 34). No other focal scalp lesions. Sinuses/Orbits: Globes and orbital soft tissues demonstrate no acute finding. Paranasal sinuses are largely clear. No significant mastoid effusion. Other: None. IMPRESSION: 1. Multiple scattered dural, leptomeningeal, and parenchymal intracranial metastasis as detailed above. Associated vasogenic edema at the anterior left temporal lobe without significant regional mass effect. 2. 4 mm nodular FLAIR hyperintensity involving the left frontal scalp, indeterminate. Correlation with physical exam recommended. 3. No other acute intracranial abnormality. Electronically Signed   By: BJeannine BogaM.D.   On: 03/24/2022 22:54  MR LIVER W WO CONTRAST  Result Date: 03/04/2022 CLINICAL DATA:  Metastatic breast cancer, abnormal liver by prior CT, concern for hepatic metastatic disease, characterize small right lobe liver lesion EXAM: MRI ABDOMEN WITHOUT AND WITH CONTRAST TECHNIQUE: Multiplanar multisequence MR imaging of the abdomen was performed both before and after the administration of intravenous contrast. CONTRAST:  7.5 mL Vueway gadolinium contrast IV COMPARISON:  CT abdomen pelvis, 02/11/2022 FINDINGS: Lower chest: Trace bilateral pleural effusions. Hepatobiliary: Somewhat coarse contour of the liver with very heterogeneous parenchymal enhancement. There is an intrinsically T1 hypointense, T2 intermediate lesion of the anterior liver dome, hepatic segment VIII, measuring 1.0 x 1.0 cm (series 15, image 23). This demonstrates initial rim enhancement and very slow internal enhancement  on multiphasic sequences. No gallstones. No biliary ductal dilatation. Pancreas: Fluid signal, nonenhancing cystic lesion of the pancreatic uncinate measuring 1.1 x 0.7 cm. No solid mass, inflammatory changes, or other parenchymal abnormality identified.No pancreatic ductal dilatation. Spleen:  Mild splenomegaly, maximum coronal span 13.3 cm. Adrenals/Urinary Tract: Normal adrenal glands. No renal masses or suspicious contrast enhancement identified. No evidence of hydronephrosis. Stomach/Bowel: Visualized portions within the abdomen are unremarkable. Vascular/Lymphatic: No pathologically enlarged lymph nodes identified. No abdominal aortic aneurysm demonstrated. Other: Moderate volume ascites throughout the abdomen, slightly increased compared to prior examination. Musculoskeletal: Diffuse, heterogeneous osseous metastatic disease. IMPRESSION: 1. Small lesion of the anterior liver dome, hepatic segment VIII, measuring 1.0 cm, as seen on prior CT. This demonstrates initial rim enhancement and very slow internal enhancement on multiphasic sequences. Although this may reflect a very slowly filling hemangioma, in the setting of known metastatic breast malignancy a demonstrates no definitively benign features and is highly suspicious for a small metastasis. No other liver lesions appreciated. 2. Somewhat coarse contour of the liver with very heterogeneous parenchymal enhancement, as well as ascites and mild splenomegaly, findings in keeping with cirrhosis 3. Moderate volume ascites, slightly increased compared to prior examination. 4. Diffuse, heterogeneous osseous metastatic disease. 5. Fluid signal, nonenhancing cystic lesion of the pancreatic uncinate measuring 1.1 x 0.7 cm. This is consistent with a small pseudocyst or side branch IPMN. No specific further follow-up or characterization is required for this lesion in the setting of known metastatic primary malignancy. Attention on follow-up. Electronically Signed    By: Delanna Ahmadi M.D.   On: 03/04/2022 08:53   CT CHEST ABDOMEN PELVIS W CONTRAST  Result Date: 03/25/2022 CLINICAL DATA:  CNS neoplasm, staging workup. Inpatient encounter. Stage IV breast cancer with diffuse bone metastases. EXAM: CT CHEST, ABDOMEN, AND PELVIS WITH CONTRAST TECHNIQUE: Multidetector CT imaging of the chest, abdomen and pelvis was performed following the standard protocol during bolus administration of intravenous contrast. RADIATION DOSE REDUCTION: This exam was performed according to the departmental dose-optimization program which includes automated exposure control, adjustment of the mA and/or kV according to patient size and/or use of iterative reconstruction technique. CONTRAST:  121m OMNIPAQUE IOHEXOL 300 MG/ML  SOLN COMPARISON:  CT chest with contrast 01/03/2022, CT abdomen and pelvis with contrast 02/11/2022 FINDINGS: CT CHEST FINDINGS Cardiovascular: The cardiac size is normal. There is no pericardial effusion. Aberrant retroesophageal right subclavian artery again noted. There is aortic tortuosity without significant atherosclerotic change apart from trace calcification in the valve plane. The great vessels opacify normally. The pulmonary veins are decompressed. Pulmonary arteries are normal caliber centrally clear. Mediastinum/Nodes: No thyroid mass, tracheal filling defect or significant esophageal thickening. Small hiatal hernia. There are calcified subcarinal and right hilar lymph nodes. There is no appreciable noncalcified  intrathoracic or axillary adenopathy. Lungs/Pleura: There are increased small to moderate-sized layering pleural effusions with adjacent consolidation or compressive atelectasis in the posterior lower lobes, with consolidation favored on the right where there is additional perihilar consolidation extending into the superior segment. Findings are concerning for right lower lobe pneumonia and there is a also scattered ground-glass opacity posteriorly in the  right upper lobe likely additional sites of pneumonitis. There is stable redemonstration of tiny subpleural nodules, for example on 5:39 in the right lower and left lower lobes and in the right lower lung field on axial images 67, 68 and 69. Reticulated pleural-parenchymal scarring in the apices is unchanged. No new or enlarging nodule is seen.  There is no pneumothorax. Musculoskeletal: Diffuse sclerotic osteoblastic metastases are again noted. There is thoracic kyphosis and multilevel wedging in the mid to lower thoracic spine but no increased thoracic wedging. Advanced asymmetric left glenohumeral DJD with calcified loose bodies and joint effusion is again noted. There is new demonstration of an oblique distal left clavicle shaft fracture with anterior displacement of the distal fragment without AC joint dislocation. The fracture is probably pathologic and occurred through a sclerotic lesion although could be traumatic. Age of the fracture is indeterminate but there is no healing callus, and this was not seen on the left shoulder series of 02/05/2021. CT ABDOMEN PELVIS FINDINGS Hepatobiliary: A 10 mm low-density lesion is again noted in the dome of the liver in segment 8 (3:42) and is unchanged. Capsular nodularity primarily along the left lobe is again noted consistent with cirrhosis with recanalized umbilical vein but the hepatic portal vein is patent and within normal caliber limits. Nonmasslike enhancement heterogeneity is again seen inferiorly in the right lobe and in the outer aspect of the left lobe lateral segment and was seen previously. There is no new liver abnormality. The gallbladder and bile ducts are unremarkable. Pancreas: No focal abnormality or ductal dilatation. An 8 mm low-density lesion in the pancreatic uncinate process has been noted on prior studies unchanged from February 2021 but this area is largely obscured due to beam hardening artifact. In any case this is likely still present and  unchanged. Spleen: Slightly enlarged with scattered calcified granulomas without mass. Adrenals/Urinary Tract: No adrenal or renal cortical mass enhancement, calculus or hydronephrosis. The bladder is obscured by extensive metal artifact from bilateral hip replacements. Stomach/Bowel: There are thickened folds in the stomach. The small bowel is unremarkable. The bowel is displaced inward by large-volume free ascites. There are thickened folds in the ascending colon. Sigmoid diverticula without acute inflammatory change or further colonic wall thickening. Vascular/Lymphatic: No adenopathy is visible through the ascites. Mild aortic atherosclerosis without AAA. Patent portal vein, SMV and splenic vein. Patent IVC and pelvic deep veins. Reproductive: The uterus is intact. No adnexal masses seen. Limited view of the lower pelvic sidewalls due to hip replacements. Other: There is no incarcerated hernia. There is interval worsening large-volume free ascites in the abdomen and pelvis. There is interval increased body wall anasarca and mesenteric congestive changes. There is no free air no appreciable free hemorrhage or abscess. Musculoskeletal: There are again noted diffuse sclerotic skeletal metastases. No regional skeletal fracture is seen. Old hip replacements. IMPRESSION: 1. Interval worsening of small to moderate symmetric layering pleural effusions with adjacent consolidation in the right lower lobe, favor compressive atelectatic effect in the posterior left lower lobe. 2. Additional areas of ground-glass infiltrate in the right upper lobe probably from pneumonitis. 3. Stable tiny subpleural nodularity in the  lungs. No thoracic adenopathy is seen no new or increased nodules. 4. Hiatal hernia. 5. Cirrhotic changes in the liver with recanalized umbilical vein, worsening body wall anasarca, worsening large-volume free ascites displacing the bowel inward. 6. Stable 10 mm hypodense lesion in the dome of segment 8 of the  liver, nonspecific heterogeneity in enhancement in the inferior right and outer left lobe lateral segments. 7. Small hypodense lesion in the pancreatic head described on prior studies is likely being obscured by moderate beam hardening artifact but is probably unchanged. 8. Thickened folds in the stomach and ascending colon which could be due to gastritis/colitis or changes of portal hypertension versus reactive related to the ascites. 9. No abdominal adenopathy is seen through the extensive ascites and mesenteric congestive changes. 10. Diffuse extensive skeletal metastatic disease appears similar, but today there is also a newly noted fracture in the distal left clavicle with anterior displacement without AC joint dislocation, favored pathologic. No other visible regional skeletal fracture. 11. Aortic atherosclerosis, small amount. Electronically Signed   By: Telford Nab M.D.   On: 03/25/2022 21:26   US Paracentesis  Result Date: 03/26/2022 INDICATION: Patient with history of metastatic breast cancer, elevated liver function tests, cirrhosis, ascites. Request received for diagnostic and therapeutic paracentesis. EXAM: ULTRASOUND GUIDED DIAGNOSTIC AND THERAPEUTIC PARACENTESIS MEDICATIONS: 8 mL of 1% lidocaine COMPLICATIONS: None immediate. PROCEDURE: Informed written consent was obtained from the patient after a discussion of the risks, benefits and alternatives to treatment. A timeout was performed prior to the initiation of the procedure. Initial ultrasound scanning demonstrates a moderate to large amount of ascites within the right mid to lower abdominal quadrant. The right mid to lower abdomen was prepped and draped in the usual sterile fashion. 1% lidocaine was used for local anesthesia. Following this, a 19 gauge, 10-cm, Yueh catheter was introduced. An ultrasound image was saved for documentation purposes. The paracentesis was performed. The catheter was removed and a dressing was applied. The patient  tolerated the procedure well without immediate post procedural complication. FINDINGS: A total of approximately 4.7 liters of yellow fluid was removed. Samples were sent to the laboratory as requested by the clinical team. IMPRESSION: Successful ultrasound-guided diagnostic and therapeutic paracentesis yielding 4.7 liters of peritoneal fluid. Read by: Rowe Robert, PA-C Electronically Signed   By: Markus Daft M.D.   On: 03/26/2022 14:27   MR TOTAL SPINE METS SCREENING  Result Date: 03/26/2022 CLINICAL DATA:  66 year old female with metastatic breast cancer. 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 EXAM: MRI TOTAL SPINE WITHOUT AND WITH CONTRAST TECHNIQUE: Multisequence MR imaging of the spine from the cervical spine to the sacrum was performed prior to and following IV contrast administration for evaluation of spinal metastatic disease. CONTRAST:  14m GADAVIST GADOBUTROL 1 MMOL/ML IV SOLN COMPARISON:  Brain MRI 03/24/2022.  Total spine MRI 01/03/2022. FINDINGS: MRI CERVICAL SPINE FINDINGS Alignment: Stable since March. Mild straightening of cervical lordosis. Vertebrae: Diffuse abnormal marrow signal in keeping with chronic widespread osseous metastatic disease. No convincing acute marrow edema. Limited fat sat postcontrast visualization of the cervical spine similar to the March MRI. But postcontrast axial imaging obtained from the C3 through the C7 level. No conspicuous vertebral enhancement. Cord: Stable since March. Mild degenerative spinal stenosis related cord mass effect at C5-C6. No cord edema. And on axial postcontrast images there is no leptomeningeal enhancement or dural thickening identified. Posterior Fossa, vertebral arteries, paraspinal tissues: No neck mass or lymphadenopathy is evident. Grossly normal  visible basilar cisterns. Disc levels: No change from March.  Mild degenerative spinal stenosis at C5-C6. MRI THORACIC SPINE FINDINGS Segmentation:   Appears normal. Alignment: Stable thoracic vertebral height and alignment since March with exaggerated kyphosis. Vertebrae: Diffuse osseous metastatic disease, diffuse abnormal marrow replacement. Cord: Stable since March. Chronic degenerative mild spinal cord mass effect at T5-T6 appears related to chronic disc or disc osteophyte protrusion (series 102, image 8 on axial images). No thoracic spinal cord edema. No abnormal intradural enhancement or dural thickening identified. Paraspinal and other soft tissues: Moderate bilateral layering pleural effusions appear to be new since March (series 102, image 11). Disc levels: Stable since March. Borderline to mild degenerative spinal stenosis at T5-T6. MRI LUMBAR SPINE FINDINGS Segmentation: Transitional anatomy. Lumbarized S1 level as designated on the previous total spine MRI. Alignment: Stable lordosis, subtle chronic anterolisthesis of L5 on S1. Vertebrae: Diffuse marrow replacement due to osseous metastatic disease. Conus medullaris: Extends to the L1-L2 level, appears stable and within normal limits. No abnormal intradural enhancement or dural thickening identified. On axial T2 images (series 104) there is no thickening of the cauda equina nerve [name]. Paraspinal and other soft tissues: Grossly stable. Disc levels: Chronic degenerative spinal stenosis at L5-S1 is moderate to severe and multifactorial related to anterolisthesis, disc bulging, severe facet and ligament flavum hypertrophy. Degenerative facet joint fluid is chronic. However, previously suspected large synovial cyst complicating the stenosis here appears regressed or resolved since March. IMPRESSION: 1. No leptomeningeal, spinal cord, or cauda equina metastatic disease identified. 2. Diffuse osseous metastatic disease with highly abnormal marrow signal, not significantly changed from total spine MRI in March. 3. Chronic degenerative cervical (C5-C6), thoracic (T5-T6) and lumbar (L5-S1) spinal stenosis is  stable except for regressed or resolved L5-S1 synovial cyst. Electronically Signed   By: Genevie Ann M.D.   On: 03/26/2022 07:57     ELIGIBLE FOR AVAILABLE RESEARCH PROTOCOL: no  ASSESSMENT:   66 y.o. 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, estrogen receptor positive  (a) s/p adjuvant chemotherapy with doxorubicin and cyclophosphamide x4  (b) status post adjuvant radiation  (c) status post tamoxifen for 5+ years  (1) benign leukopenia: noted 2011, felt secondary to prior chemotherapy  (2) genetics testing 06/23/2018 through [facility] Multi[facility] panel found no deleterious mutations in the 84 genes tested including BRCA 1-2  (a) 2 variants of uncertain significance were fund in [place] [c.1037C>T (p.Ser346Phe) and c.503C>G (p.ALA168Gly)  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, 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 (3) staging studies:  (a) breast MRI 10/30/2019 shows sternal mottling  (b) bone scan 11/19/2019 shows multiple areas of uptake suspicious for metastatic disease  (c) CT of the chest abdomen and pelvis 11/28/2019 shows no visceral disease  (d) tumor markers 11/23/2019 shows a CEA of 18.11, CA 27-29 of 61.2, CA 125 of 21.1  (e) F-18-estradiol PET (cerianna) 01/11/2020 shows multiple sclerotic bone lesions which do not accumulate the estrogen receptor specific radiotracer  (f) bone marrow biopsy 01/25/2020 confirms metastatic breast cancer, gross cystic disease fluid protein positive, but estrogen and progesterone receptor negative.  HER-2/neu was 2+ on immunohistochemistry, negative by FISH (1.35/1.70).  (g) CARIS confirms triple negative disease, finds a positive androgen receptor at 3+, 70%; and the PIK3CA was indeterminate.  PTEN was positive by IHC at 90%.  PD-L1 was negative.  Genomic LOH was low and MSI was stable with proficient mismatch mismatch repair.  BRCA 1 and 2 were negative.  (4) denosumab/Xgeva  started 12/13/2019,  repeated monthly  (5) capecitabine started 02/25/2020, with significant plantar dysesthesia developing after 1 week, drug held 03/12/2020  (a) capecitabine restarted 03/25/2020 at 1000 mg in the morning only.  (b) bone scan 07/23/2020 shows no evidence of disease progression   (c) MRI of the total spine and pelvis 11/01/2020 shows multiple bone lesions but no epidural encroachment, no compression fracture and no evidence for lymphangitic spread of disease; there is significant degenerative disease  (d) repeat bone scan 01/13/2021 difficult to interpret, no definitive progression  (e) capecitabine was held as of 01/30/2021 with significant palmar plantar erythrodysesthesia  (f) capecitabine resumed 03/10/2021 at a lower dose (1 g every other day).  (g) dose increased to 1 g daily Monday through Friday, off on weekends as of 04/28/2021 (h) capecitabine held after 09/16/2021 to allow time for PPED to recover             (G) capecitabine continued at very small doses, last discontinued on    Mar 02, 2022 MRI liver to assess the status of metastatic disease showed a small lesion on the anterior liver dome measuring 1 cm, not clear if this is a slowly filling hemangioma versus metastatic malignancy.  No other liver lesions.  Metastatic bone disease.  Moderate volume ascites slightly increased, possible IPMN or pseudocyst in the pancreatic uncinate process.  She also has changes in the liver contour suggestive of cirrhosis  PLAN:    Total time spent: 30 minutes  *Total Encounter Time as defined by the Centers for Medicare and Medicaid Services includes, in addition to the face-to-face time of a patient visit (documented in the note above) non-face-to-face time: obtaining and reviewing outside history, ordering and reviewing medications, tests or procedures, care coordination (communications with other health care professionals or caregivers) and documentation in the medical record.

## 2022-03-29 NOTE — Telephone Encounter (Signed)
TCT patient's mobile and home number to check on status of patient. Patient scheduled to see Dr. Chryl Heck today. Left message letting her know to reach out with any needs.

## 2022-03-29 NOTE — Telephone Encounter (Signed)
Transition Care Management Follow-up Telephone Call Date of discharge and from where: [name] 03/26/2022 How have you been since you were released from the hospital? Good  Any questions or concerns? No  Items Reviewed: Did the pt receive and understand the discharge instructions provided? Yes  Medications obtained and verified? Yes  Other? No  Any new allergies since your discharge? No  Dietary orders reviewed? Yes Do you have support at home? Yes   Home Care and Equipment/Supplies: Were home health services ordered? not applicable If so, what is the name of the agency? N/a  Has the agency set up a time to come to the patient's home? not applicable Were any new equipment or medical supplies ordered?  No What is the name of the medical supply agency? N/a Were you able to get the supplies/equipment? not applicable Do you have any questions related to the use of the equipment or supplies? No  Functional Questionnaire: (I = Independent and D = Dependent) ADLs: I  Bathing/Dressing- I  Meal Prep- I  Eating- I  Maintaining continence- I  Transferring/Ambulation- I  Managing Meds- I  Follow up appointments reviewed:  PCP Hospital f/u appt confirmed? No  Scheduled to see -Patient to call office to schedule due to large number of specialist visits   Specialist Hospital f/u appt confirmed? Yes  Scheduled to see [facility]  on 03/30/2022 @ 1100am . Are transportation arrangements needed? No  If their condition worsens, is the pt aware to call PCP or go to the Emergency Dept.? Yes Was the patient provided with contact information for the PCP's office or ED? Yes Was to pt encouraged to call back with questions or concerns? Yes

## 2022-03-29 NOTE — Telephone Encounter (Signed)
Spoke with pt who informed that she is to be evaluated at [facility] for potential trial on 03/30/22. At this point pt is scheduled to start radiation at [facility] on 03/31/22. Requested that patient call clinic to update on what her plan will be  Next MD visit with Dr. Chryl Heck is 04/20/22.

## 2022-03-30 ENCOUNTER — Other Ambulatory Visit (HOSPITAL_COMMUNITY): Payer: Self-pay

## 2022-03-30 ENCOUNTER — Ambulatory Visit: Payer: Medicare Other

## 2022-03-30 LAB — MITOCHONDRIAL ANTIBODIES: Mitochondrial M2 Ab, IgG: <20 Units (ref 0.0–20.0)

## 2022-03-31 ENCOUNTER — Telehealth: Payer: Self-pay | Admitting: *Deleted

## 2022-03-31 ENCOUNTER — Encounter: Payer: Self-pay | Admitting: Radiation Oncology

## 2022-03-31 ENCOUNTER — Ambulatory Visit: Payer: Medicare Other | Admitting: Radiation Oncology

## 2022-03-31 LAB — CULTURE, BODY FLUID W GRAM STAIN -BOTTLE: Culture: NO GROWTH

## 2022-03-31 LAB — GRAM STAIN

## 2022-03-31 LAB — CYTOLOGY - NON PAP

## 2022-03-31 LAB — ANTI-SMOOTH MUSCLE ANTIBODY, IGG: F-Actin IgG: 12 Units (ref 0–19)

## 2022-03-31 NOTE — Telephone Encounter (Signed)
I spoke with the patient who voiced she would like to cancel all of her radiation treatments.  She has been enrolled in a trial at [facility] and does not need radiation at this time.  She was informed to reach out to [place] if we can help with anything.  MD and treatment staff made aware.  Gloriajean Dell. Leonie Green, BSN

## 2022-04-01 ENCOUNTER — Ambulatory Visit: Payer: Medicare Other

## 2022-04-02 ENCOUNTER — Other Ambulatory Visit (HOSPITAL_COMMUNITY): Payer: Self-pay

## 2022-04-02 ENCOUNTER — Ambulatory Visit: Payer: Medicare Other

## 2022-04-05 ENCOUNTER — Ambulatory Visit: Payer: Medicare Other

## 2022-04-05 ENCOUNTER — Encounter: Payer: Self-pay | Admitting: Oncology

## 2022-04-05 NOTE — Progress Notes (Signed)
  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 Her complete spine MRI was negative for disease. She simulated for whole brain radiation with the intent on starting therapy on 03/29/22. She was able to get in to see Dr. Wetzel Bjornstad at [facility] and ultimately decided to forgo radiation as she will be enrolling in a trial.   Plan: The patient will be followed as needed moving forward. ________________________________    Carola Rhine, PAC

## 2022-04-06 ENCOUNTER — Telehealth: Payer: Self-pay | Admitting: Internal Medicine

## 2022-04-06 ENCOUNTER — Ambulatory Visit: Payer: Medicare Other

## 2022-04-06 ENCOUNTER — Telehealth: Payer: Self-pay

## 2022-04-06 DIAGNOSIS — R5382 Chronic fatigue, unspecified: Secondary | ICD-10-CM

## 2022-04-06 NOTE — Telephone Encounter (Signed)
Pt husband is calling to see if she can come in for a lab drawn for glucose check. Pt is currently undergoing cancer treatment at [facility] and is on Steroids.  Pt is very tired, lack of appetite, lethargic but they think that its Glucose related    Paul Dykes [phone]

## 2022-04-06 NOTE — Telephone Encounter (Signed)
Called pt to sch appt per 6/12 msg from [place]. Spoke to pt's husband who seemed confused as to why I was calling. He said he was unaware I would be reaching out to sch this appt. I sent a msg to Manuela Schwartz to see if she could reach out to the pt's husband for further explanation. I let him know we would f/u.

## 2022-04-07 ENCOUNTER — Ambulatory Visit: Payer: Medicare Other

## 2022-04-07 NOTE — Telephone Encounter (Signed)
Spoke with patient's husband and labs were done at [facility] so not needed.

## 2022-04-07 NOTE — Telephone Encounter (Signed)
Yes - ordered glucose, kidney and liver tsts - can order anything else if needed

## 2022-04-08 ENCOUNTER — Ambulatory Visit: Payer: Medicare Other

## 2022-04-09 ENCOUNTER — Ambulatory Visit: Payer: Medicare Other

## 2022-04-12 ENCOUNTER — Ambulatory Visit: Payer: Medicare Other

## 2022-04-13 ENCOUNTER — Ambulatory Visit: Payer: Medicare Other

## 2022-04-14 ENCOUNTER — Ambulatory Visit: Payer: Medicare Other

## 2022-04-16 ENCOUNTER — Encounter: Payer: Self-pay | Admitting: Hematology and Oncology

## 2022-04-20 ENCOUNTER — Inpatient Hospital Stay: Payer: Medicare Other | Admitting: Hematology and Oncology

## 2022-04-20 ENCOUNTER — Inpatient Hospital Stay: Payer: Medicare Other

## 2022-04-24 DEATH — deceased

## 2022-09-27 ENCOUNTER — Other Ambulatory Visit (HOSPITAL_COMMUNITY): Payer: Self-pay

## 2022-10-15 ENCOUNTER — Ambulatory Visit: Payer: Medicare Other | Admitting: Internal Medicine

## 2022-12-21 IMAGING — MR MR TOTAL SPINE METS SCREENING
16 of 20 series · 34 of 48 positions shown · IV contrast (gadavist)
Comparison: None.

CLINICAL DATA: Metastatic breast cancer with known bone metastases

EXAM:
MRI TOTAL SPINE WITHOUT AND WITH CONTRAST
TECHNIQUE: Multisequence MR imaging of the spine from the cervical spine to the
sacrum was performed prior to and following IV contrast
administration for evaluation of spinal metastatic disease.
CONTRAST:  6 mL Gadavist

[Series 16: T1 · sagittal · 4.0mm · 1.72mm/px · 2 of 11 slices shown (1 of 5)]
[im 1/11]
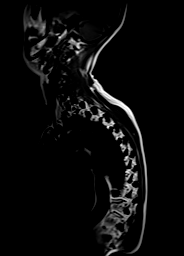
[im 11/11]
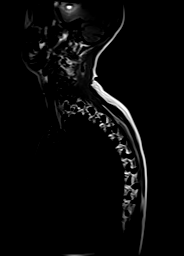

[Series 17: T1 · sagittal · 4.0mm · 1.00mm/px · 2 of 15 slices shown (2 of 5)]
[im 1/15]
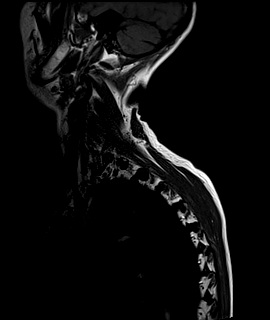
[im 15/15]
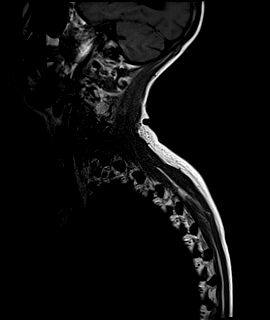

[Series 18: T1 · sagittal · 4.0mm · 1.19mm/px · 2 of 15 slices shown (3 of 5)]
[im 1/15]
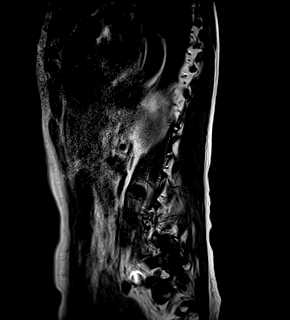
[im 15/15]
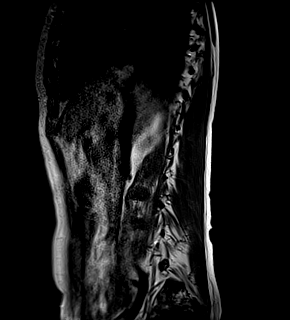

[Series 19: T1 · sagittal · 4.0mm · 1.00mm/px · 2 of 15 slices shown (4 of 5)]
[im 1/15]
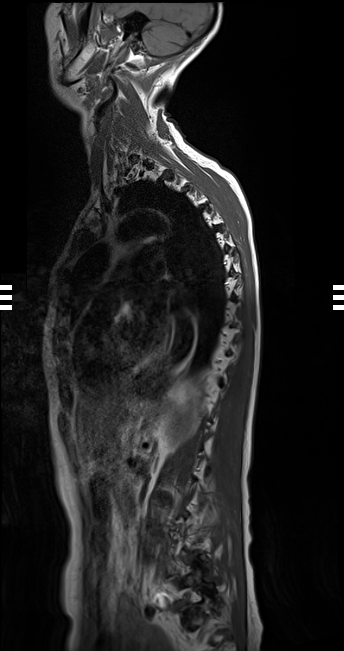
[im 15/15]
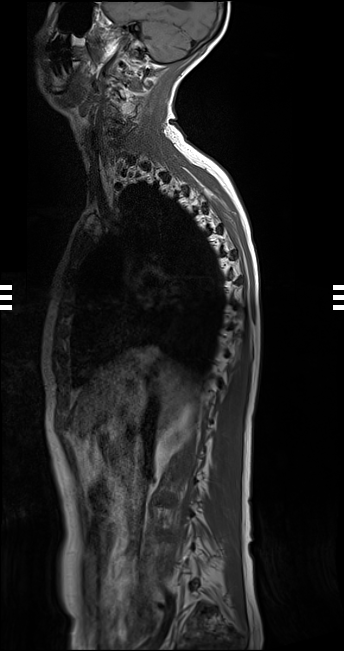

[Series 20: T1 · sagittal · 4.0mm · 1.00mm/px · 2 of 15 slices shown (5 of 5)]
[im 1/15]
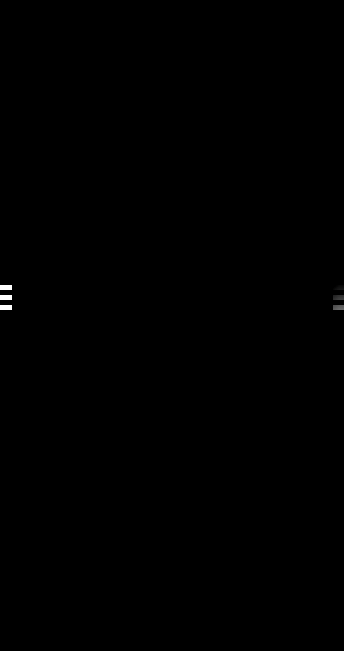
[im 15/15]
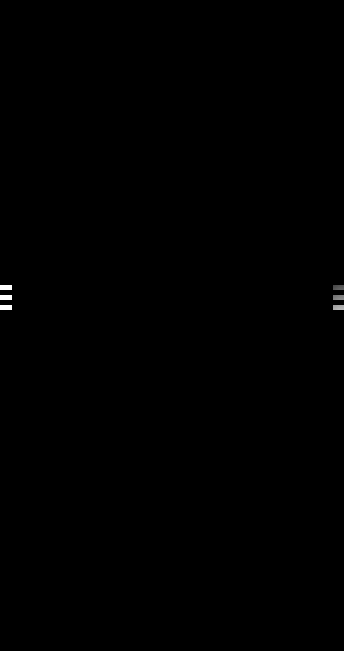

[Series 21: STIR · sagittal · 4.0mm · 1.00mm/px · 1 of 15 slices shown (1 of 2)]
[im 1/15]
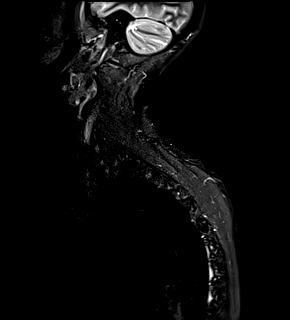

[Series 22: STIR · sagittal · 4.0mm · 1.19mm/px · 1 of 15 slices shown (2 of 2)]
[im 1/15]
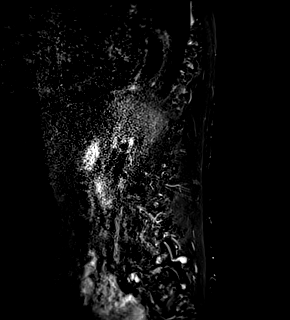

[Series 41: T1 fat-sat post-contrast · sagittal · 4.0mm · 1.00mm/px · 1 of 15 slices shown (1 of 2)]
[im 1/15]
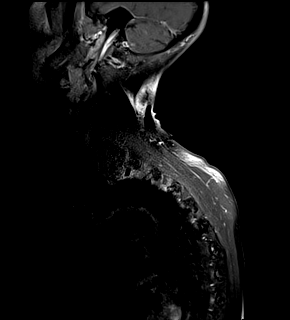

[Series 42: T1 fat-sat post-contrast · sagittal · 4.0mm · 1.19mm/px · 2 of 17 slices shown (2 of 2)]
[im 1/17]
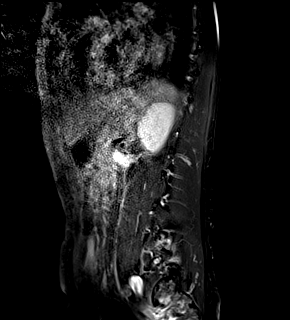
[im 17/17]
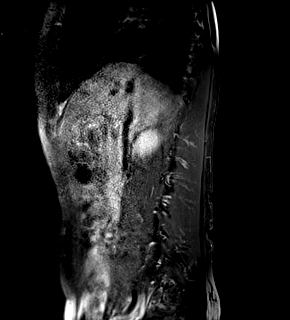

[Series 43: T1 fat-sat · sagittal · 4.0mm · 1.00mm/px · 1 of 15 slices shown]
[im 1/15]
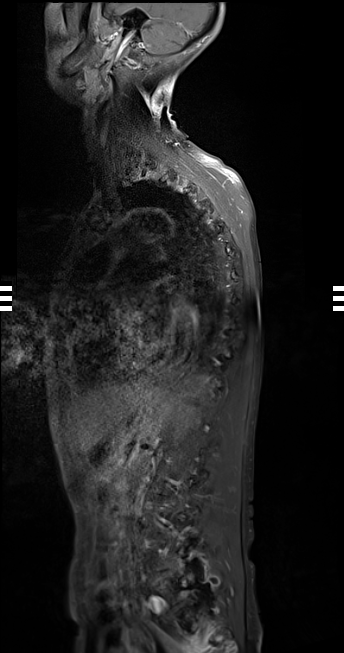

[Series 45: T2 post-contrast · sagittal · 4.0mm · 1.00mm/px · 1 of 15 slices shown (1 of 5)]
[im 1/15]
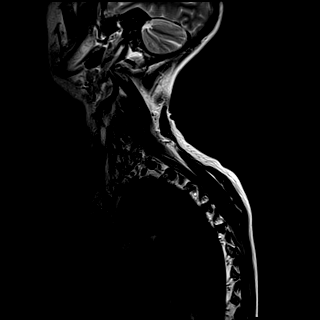

[Series 46: T2 post-contrast · sagittal · 4.0mm · 1.19mm/px · 2 of 17 slices shown (2 of 5)]
[im 1/17]
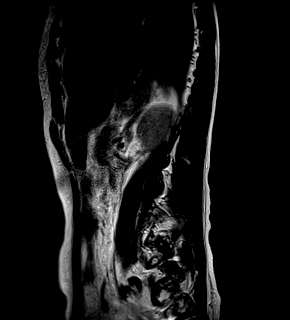
[im 17/17]
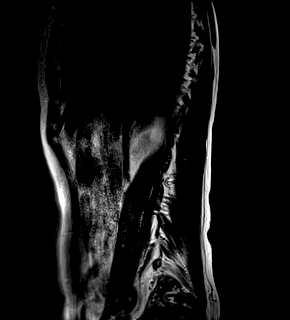

[Series 47: T2 post-contrast · sagittal · 4.0mm · 1.00mm/px · 1 of 15 slices shown (3 of 5)]
[im 1/15]
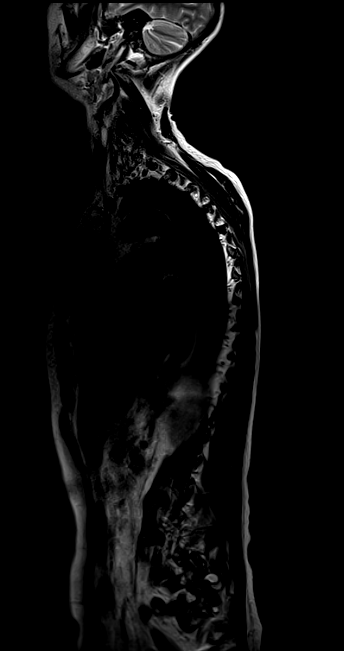

[Series 49: T1 post-contrast · axial · 4.0mm · 0.52mm/px · z∈[-135,-68]mm · 2 of 45 slices shown]
[im 1/45]
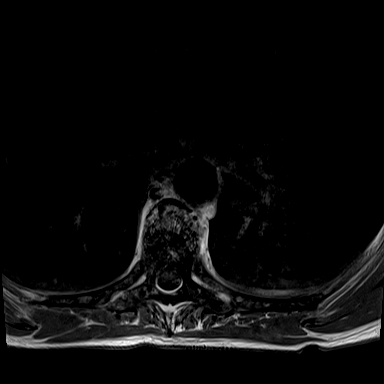
[im 15/45]
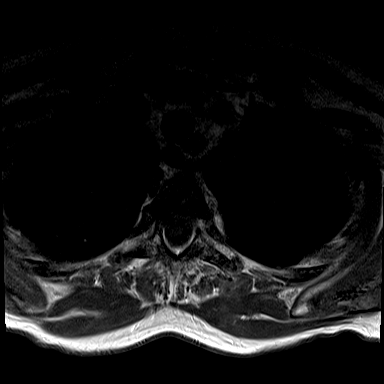

[Series 50: T2 post-contrast · axial · 4.0mm · 0.52mm/px · z∈[-135,+74]mm · 4 of 45 slices shown (4 of 5)]
[im 1/45]
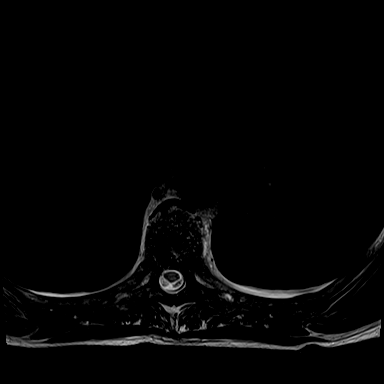
[im 15/45]
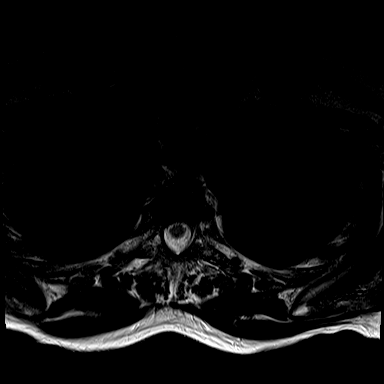
[im 30/45]
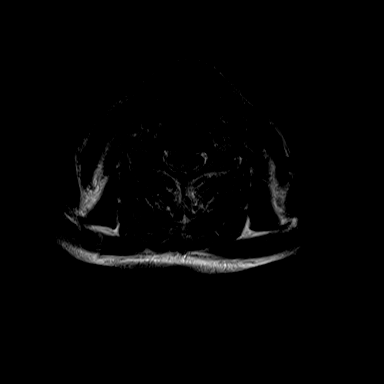
[im 45/45]
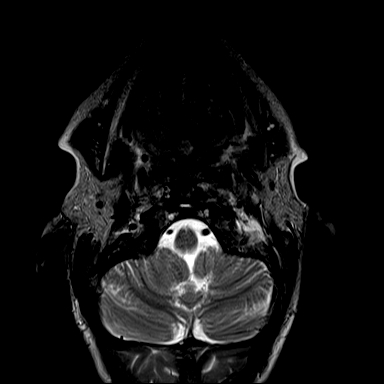

[Series 52: T2 post-contrast · axial · 4.0mm · 0.52mm/px · z∈[-472,-90]mm · 8 of 78 slices shown (5 of 5)]
[im 1/78]
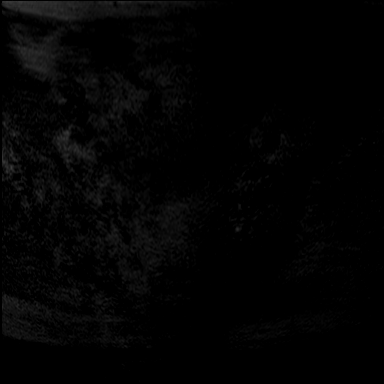
[im 12/78]
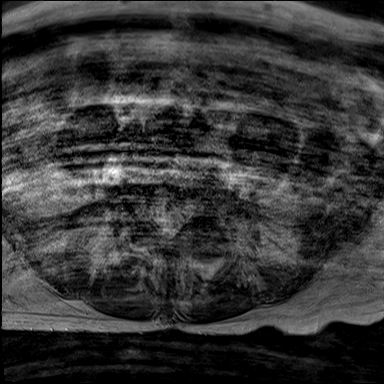
[im 23/78]
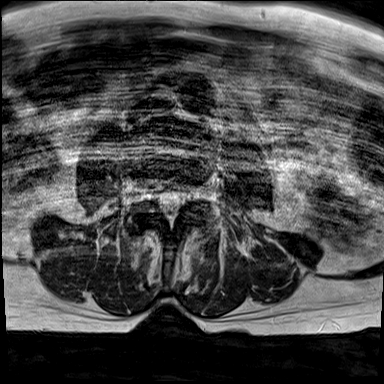
[im 34/78]
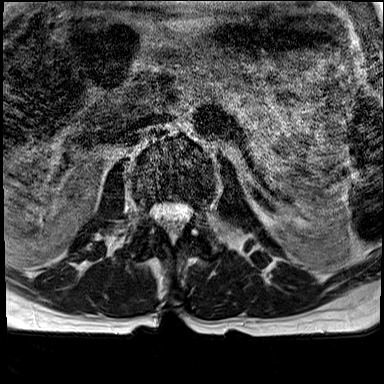
[im 45/78]
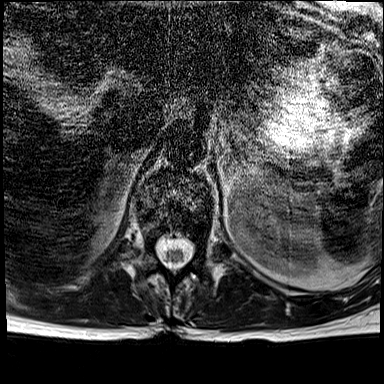
[im 56/78]
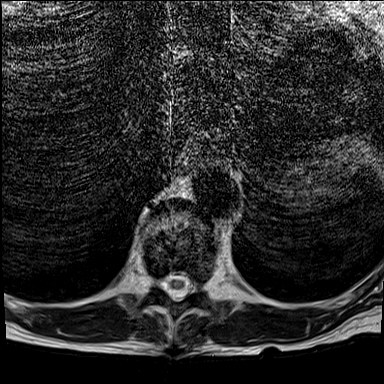
[im 67/78]
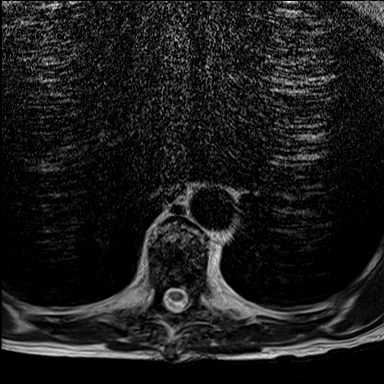
[im 78/78]
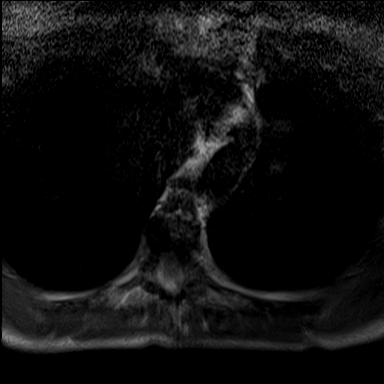

[34 of 48 positions shown; findings below may reference images not displayed]

FINDINGS: Motion artifact is present. Full spine imaging obtained in upper and
lower stations. Lower station axial images do not localize to
sagittal images

MRI CERVICAL SPINE

Alignment: Mild retrolisthesis at C5-C6.

Vertebrae: Diffuse abnormal signal with areas of low T1 and T2
signal. There is no significant epidural disease.

Cord: Normal signal.  No abnormal intrathecal enhancement.

Posterior Fossa, vertebral arteries, paraspinal tissues: No abnormal
enhancement.

Disc levels: Multilevel degenerative disc disease. There are disc
osteophyte complexes at C5-C6 and C6-C7. Moderate canal stenosis at
C5-C6. Multilevel facet and uncovertebral hypertrophy. Foraminal
narrowing also greatest at C5-C6 and C6-C7.

MRI THORACIC SPINE

Alignment:  No significant listhesis.

Vertebrae: Diffuse abnormal signal with heterogeneous areas of low
T1 and T2 signal reflecting sclerosis and foci of enhancement. There
is no significant epidural disease.

Cord:  No abnormal cord signal.

Paraspinal and other soft tissues: Unremarkable.

Disc levels: Multilevel degenerative disc disease. Most notably,
there is a central disc protrusion at T5-T6 indenting the ventral
aspect of the cord. No high-grade canal or foraminal stenosis
identified.

MRI LUMBAR SPINE

Segmentation: Transitional anatomy at the lumbosacral junction when
counting from above and assuming 12 rib-bearing thoracic vertebral
bodies. For the purposes of this dictation, there is a lumbarized
S1.

Alignment:  No significant listhesis.

Vertebrae: Diffuse abnormal signal with areas of low T1 and T2
signal reflecting sclerosis and foci of enhancement. There is no
significant epidural disease.

Conus medullaris: Extends to the L2 level and appears normal. No
abnormal intrathecal enhancement.

Paraspinal and other soft tissues: Unremarkable.

Disc levels: Multilevel degenerative disc disease with disc bulges
and endplate osteophytes. Multilevel facet arthropathy, greatest at
L5-S1 where there are joint effusions and extra-spinal synovial
cysts. No high-grade canal stenosis identified. Foraminal narrowing
is greatest on the left at L5-S1.
IMPRESSION: Motion degraded study.

Diffuse osseous metastatic disease. No significant epidural disease.
No recent compression fracture. No evidence of leptomeningeal
disease.

## 2023-01-11 ENCOUNTER — Encounter: Payer: Self-pay | Admitting: Oncology
